# Patient Record
Sex: Female | Born: 1945
Health system: Southern US, Community
[De-identification: ages and names within clinical notes are randomized; demographics above are authoritative.]

## PROBLEM LIST (undated history)

## (undated) DIAGNOSIS — E039 Hypothyroidism, unspecified: Secondary | ICD-10-CM

## (undated) DIAGNOSIS — H409 Unspecified glaucoma: Secondary | ICD-10-CM

## (undated) DIAGNOSIS — E0500 Thyrotoxicosis with diffuse goiter without thyrotoxic crisis or storm: Secondary | ICD-10-CM

## (undated) DIAGNOSIS — G5760 Lesion of plantar nerve, unspecified lower limb: Secondary | ICD-10-CM

## (undated) DIAGNOSIS — I63111 Cerebral infarction due to embolism of right vertebral artery: Secondary | ICD-10-CM

## (undated) DIAGNOSIS — M419 Scoliosis, unspecified: Secondary | ICD-10-CM

## (undated) DIAGNOSIS — E785 Hyperlipidemia, unspecified: Secondary | ICD-10-CM

## (undated) DIAGNOSIS — K219 Gastro-esophageal reflux disease without esophagitis: Secondary | ICD-10-CM

## (undated) DIAGNOSIS — K2270 Barrett's esophagus without dysplasia: Secondary | ICD-10-CM

## (undated) DIAGNOSIS — K5792 Diverticulitis of intestine, part unspecified, without perforation or abscess without bleeding: Secondary | ICD-10-CM

## (undated) DIAGNOSIS — R011 Cardiac murmur, unspecified: Secondary | ICD-10-CM

## (undated) DIAGNOSIS — A048 Other specified bacterial intestinal infections: Secondary | ICD-10-CM

## (undated) DIAGNOSIS — G4700 Insomnia, unspecified: Secondary | ICD-10-CM

## (undated) DIAGNOSIS — I639 Cerebral infarction, unspecified: Secondary | ICD-10-CM

## (undated) DIAGNOSIS — B159 Hepatitis A without hepatic coma: Secondary | ICD-10-CM

## (undated) DIAGNOSIS — K5790 Diverticulosis of intestine, part unspecified, without perforation or abscess without bleeding: Secondary | ICD-10-CM

## (undated) DIAGNOSIS — F419 Anxiety disorder, unspecified: Secondary | ICD-10-CM

## (undated) DIAGNOSIS — H40059 Ocular hypertension, unspecified eye: Secondary | ICD-10-CM

## (undated) HISTORY — PX: SUBCLAVIAN STENT PLACEMENT: SUR1038

## (undated) HISTORY — DX: Scoliosis, unspecified: M41.9

## (undated) HISTORY — DX: Hypothyroidism, unspecified: E03.9

## (undated) HISTORY — DX: Diverticulitis of intestine, part unspecified, without perforation or abscess without bleeding: K57.92

## (undated) HISTORY — PX: FOOT NEUROMA SURGERY: SHX646

## (undated) HISTORY — DX: Thyrotoxicosis with diffuse goiter without thyrotoxic crisis or storm: E05.00

## (undated) HISTORY — PX: LAPAROSCOPIC ASSISTED VAGINAL HYSTERECTOMY: SHX5398

## (undated) HISTORY — DX: Gastro-esophageal reflux disease without esophagitis: K21.9

## (undated) HISTORY — DX: Hepatitis a without hepatic coma: B15.9

## (undated) HISTORY — PX: CATARACT EXTRACTION: SUR2

## (undated) HISTORY — DX: Unspecified glaucoma: H40.9

## (undated) HISTORY — DX: Other specified bacterial intestinal infections: A04.8

## (undated) HISTORY — PX: TUBAL LIGATION: SHX77

## (undated) HISTORY — DX: Lesion of plantar nerve, unspecified lower limb: G57.60

## (undated) HISTORY — PX: EYE SURGERY: SHX253

## (undated) HISTORY — DX: Cardiac murmur, unspecified: R01.1

## (undated) HISTORY — DX: Barrett's esophagus without dysplasia: K22.70

## (undated) HISTORY — DX: Cerebral infarction, unspecified: I63.9

## (undated) HISTORY — PX: FEMUR SURGERY: SHX943

## (undated) HISTORY — PX: COLON SURGERY: SHX602

## (undated) HISTORY — DX: Diverticulosis of intestine, part unspecified, without perforation or abscess without bleeding: K57.90

## (undated) HISTORY — DX: Anxiety disorder, unspecified: F41.9

## (undated) HISTORY — DX: Insomnia, unspecified: G47.00

## (undated) HISTORY — DX: Cerebral infarction due to embolism of right vertebral artery: I63.111

---
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
## 1990-03-12 HISTORY — PX: TONSILLECTOMY: SUR1361

## 1993-03-12 HISTORY — PX: CERVICAL LAMINECTOMY: SHX94

## 1997-07-13 ENCOUNTER — Other Ambulatory Visit: Admission: RE | Admit: 1997-07-13 | Discharge: 1997-07-13 | Payer: Self-pay | Admitting: Obstetrics and Gynecology

## 1998-05-26 ENCOUNTER — Ambulatory Visit (HOSPITAL_COMMUNITY): Admission: RE | Admit: 1998-05-26 | Discharge: 1998-05-26 | Payer: Self-pay | Admitting: Family Medicine

## 1998-08-17 ENCOUNTER — Other Ambulatory Visit: Admission: RE | Admit: 1998-08-17 | Discharge: 1998-08-17 | Payer: Self-pay | Admitting: Obstetrics and Gynecology

## 1999-02-24 ENCOUNTER — Encounter (INDEPENDENT_AMBULATORY_CARE_PROVIDER_SITE_OTHER): Payer: Self-pay | Admitting: Specialist

## 1999-02-24 ENCOUNTER — Ambulatory Visit (HOSPITAL_COMMUNITY): Admission: RE | Admit: 1999-02-24 | Discharge: 1999-02-24 | Payer: Self-pay | Admitting: Internal Medicine

## 1999-04-17 ENCOUNTER — Encounter: Payer: Self-pay | Admitting: Obstetrics and Gynecology

## 1999-04-17 ENCOUNTER — Encounter: Admission: RE | Admit: 1999-04-17 | Discharge: 1999-04-17 | Payer: Self-pay | Admitting: Obstetrics and Gynecology

## 2000-01-17 ENCOUNTER — Other Ambulatory Visit: Admission: RE | Admit: 2000-01-17 | Discharge: 2000-01-17 | Payer: Self-pay | Admitting: Obstetrics and Gynecology

## 2000-04-12 ENCOUNTER — Encounter: Admission: RE | Admit: 2000-04-12 | Discharge: 2000-04-12 | Payer: Self-pay | Admitting: Family Medicine

## 2000-04-12 ENCOUNTER — Encounter: Payer: Self-pay | Admitting: Family Medicine

## 2000-05-07 ENCOUNTER — Encounter: Admission: RE | Admit: 2000-05-07 | Discharge: 2000-05-07 | Payer: Self-pay | Admitting: Family Medicine

## 2000-05-07 ENCOUNTER — Encounter: Payer: Self-pay | Admitting: Family Medicine

## 2001-02-04 ENCOUNTER — Other Ambulatory Visit: Admission: RE | Admit: 2001-02-04 | Discharge: 2001-02-04 | Payer: Self-pay | Admitting: Obstetrics and Gynecology

## 2001-03-12 DIAGNOSIS — K2270 Barrett's esophagus without dysplasia: Secondary | ICD-10-CM

## 2001-03-12 HISTORY — DX: Barrett's esophagus without dysplasia: K22.70

## 2001-05-08 ENCOUNTER — Encounter: Admission: RE | Admit: 2001-05-08 | Discharge: 2001-05-08 | Payer: Self-pay | Admitting: Family Medicine

## 2001-05-08 ENCOUNTER — Encounter: Payer: Self-pay | Admitting: Family Medicine

## 2001-12-12 ENCOUNTER — Other Ambulatory Visit: Admission: RE | Admit: 2001-12-12 | Discharge: 2001-12-12 | Payer: Self-pay | Admitting: Family Medicine

## 2002-06-30 ENCOUNTER — Encounter: Admission: RE | Admit: 2002-06-30 | Discharge: 2002-06-30 | Payer: Self-pay | Admitting: Family Medicine

## 2002-06-30 ENCOUNTER — Encounter: Payer: Self-pay | Admitting: Family Medicine

## 2002-12-07 ENCOUNTER — Encounter: Admission: RE | Admit: 2002-12-07 | Discharge: 2002-12-07 | Payer: Self-pay | Admitting: Family Medicine

## 2002-12-07 ENCOUNTER — Encounter: Payer: Self-pay | Admitting: Family Medicine

## 2003-03-18 ENCOUNTER — Encounter: Admission: RE | Admit: 2003-03-18 | Discharge: 2003-03-18 | Payer: Self-pay | Admitting: Family Medicine

## 2003-05-13 ENCOUNTER — Encounter: Admission: RE | Admit: 2003-05-13 | Discharge: 2003-05-13 | Payer: Self-pay | Admitting: Family Medicine

## 2003-05-20 ENCOUNTER — Encounter: Admission: RE | Admit: 2003-05-20 | Discharge: 2003-05-20 | Payer: Self-pay | Admitting: Family Medicine

## 2003-06-24 ENCOUNTER — Encounter: Admission: RE | Admit: 2003-06-24 | Discharge: 2003-06-24 | Payer: Self-pay | Admitting: Family Medicine

## 2003-07-02 ENCOUNTER — Encounter: Admission: RE | Admit: 2003-07-02 | Discharge: 2003-07-02 | Payer: Self-pay | Admitting: Family Medicine

## 2003-08-16 ENCOUNTER — Encounter: Admission: RE | Admit: 2003-08-16 | Discharge: 2003-08-16 | Payer: Self-pay | Admitting: Family Medicine

## 2003-11-11 ENCOUNTER — Encounter: Admission: RE | Admit: 2003-11-11 | Discharge: 2003-11-11 | Payer: Self-pay | Admitting: Interventional Radiology

## 2004-04-27 ENCOUNTER — Ambulatory Visit (HOSPITAL_COMMUNITY): Admission: RE | Admit: 2004-04-27 | Discharge: 2004-04-27 | Payer: Self-pay | Admitting: Podiatry

## 2004-04-27 ENCOUNTER — Ambulatory Visit (HOSPITAL_BASED_OUTPATIENT_CLINIC_OR_DEPARTMENT_OTHER): Admission: RE | Admit: 2004-04-27 | Discharge: 2004-04-27 | Payer: Self-pay | Admitting: Podiatry

## 2004-04-27 ENCOUNTER — Encounter (INDEPENDENT_AMBULATORY_CARE_PROVIDER_SITE_OTHER): Payer: Self-pay | Admitting: *Deleted

## 2004-08-18 ENCOUNTER — Ambulatory Visit: Payer: Self-pay | Admitting: Internal Medicine

## 2004-08-29 ENCOUNTER — Encounter: Admission: RE | Admit: 2004-08-29 | Discharge: 2004-08-29 | Payer: Self-pay | Admitting: Family Medicine

## 2004-08-30 ENCOUNTER — Encounter (INDEPENDENT_AMBULATORY_CARE_PROVIDER_SITE_OTHER): Payer: Self-pay | Admitting: *Deleted

## 2004-08-30 ENCOUNTER — Ambulatory Visit: Payer: Self-pay | Admitting: Internal Medicine

## 2004-10-30 ENCOUNTER — Ambulatory Visit: Payer: Self-pay | Admitting: Pulmonary Disease

## 2004-12-05 ENCOUNTER — Encounter: Admission: RE | Admit: 2004-12-05 | Discharge: 2004-12-05 | Payer: Self-pay | Admitting: Pulmonary Disease

## 2004-12-29 ENCOUNTER — Ambulatory Visit: Payer: Self-pay | Admitting: Pulmonary Disease

## 2005-10-01 ENCOUNTER — Encounter: Admission: RE | Admit: 2005-10-01 | Discharge: 2005-10-01 | Payer: Self-pay | Admitting: Family Medicine

## 2006-03-16 ENCOUNTER — Inpatient Hospital Stay (HOSPITAL_COMMUNITY): Admission: EM | Admit: 2006-03-16 | Discharge: 2006-03-21 | Payer: Self-pay | Admitting: Emergency Medicine

## 2006-04-26 ENCOUNTER — Encounter: Admission: RE | Admit: 2006-04-26 | Discharge: 2006-04-26 | Payer: Self-pay | Admitting: General Surgery

## 2006-05-13 ENCOUNTER — Inpatient Hospital Stay (HOSPITAL_COMMUNITY): Admission: RE | Admit: 2006-05-13 | Discharge: 2006-05-17 | Payer: Self-pay | Admitting: General Surgery

## 2006-05-13 ENCOUNTER — Encounter (INDEPENDENT_AMBULATORY_CARE_PROVIDER_SITE_OTHER): Payer: Self-pay | Admitting: Specialist

## 2006-05-13 HISTORY — PX: LAPAROSCOPIC SIGMOID COLECTOMY: SHX5928

## 2006-07-03 ENCOUNTER — Encounter (INDEPENDENT_AMBULATORY_CARE_PROVIDER_SITE_OTHER): Payer: Self-pay | Admitting: *Deleted

## 2006-07-03 ENCOUNTER — Ambulatory Visit: Payer: Self-pay | Admitting: Internal Medicine

## 2006-07-04 ENCOUNTER — Encounter: Payer: Self-pay | Admitting: Internal Medicine

## 2006-10-03 ENCOUNTER — Encounter: Admission: RE | Admit: 2006-10-03 | Discharge: 2006-10-03 | Payer: Self-pay | Admitting: Family Medicine

## 2007-03-13 HISTORY — PX: LUMBAR DISC SURGERY: SHX700

## 2007-10-06 ENCOUNTER — Encounter: Admission: RE | Admit: 2007-10-06 | Discharge: 2007-10-06 | Payer: Self-pay | Admitting: Family Medicine

## 2008-01-27 ENCOUNTER — Encounter: Admission: RE | Admit: 2008-01-27 | Discharge: 2008-01-27 | Payer: Self-pay | Admitting: Family Medicine

## 2008-02-20 ENCOUNTER — Encounter: Admission: RE | Admit: 2008-02-20 | Discharge: 2008-02-20 | Payer: Self-pay | Admitting: Family Medicine

## 2008-02-25 ENCOUNTER — Ambulatory Visit (HOSPITAL_COMMUNITY): Admission: RE | Admit: 2008-02-25 | Discharge: 2008-02-26 | Payer: Self-pay | Admitting: Neurosurgery

## 2008-08-05 ENCOUNTER — Encounter: Payer: Self-pay | Admitting: Cardiovascular Disease

## 2008-10-08 ENCOUNTER — Encounter: Admission: RE | Admit: 2008-10-08 | Discharge: 2008-10-08 | Payer: Self-pay | Admitting: Family Medicine

## 2008-11-03 ENCOUNTER — Encounter: Admission: RE | Admit: 2008-11-03 | Discharge: 2008-11-03 | Payer: Self-pay | Admitting: Family Medicine

## 2009-06-02 ENCOUNTER — Encounter: Admission: RE | Admit: 2009-06-02 | Discharge: 2009-06-02 | Payer: Self-pay | Admitting: Orthopedic Surgery

## 2009-11-21 ENCOUNTER — Ambulatory Visit: Payer: Self-pay | Admitting: Oncology

## 2009-11-24 ENCOUNTER — Encounter: Admission: RE | Admit: 2009-11-24 | Discharge: 2009-11-24 | Payer: Self-pay | Admitting: Family Medicine

## 2009-12-02 LAB — CBC WITH DIFFERENTIAL/PLATELET
Basophils Absolute: 0 10*3/uL (ref 0.0–0.1)
Eosinophils Absolute: 0.2 10*3/uL (ref 0.0–0.5)
LYMPH%: 31.3 % (ref 14.0–49.7)
MCH: 30.1 pg (ref 25.1–34.0)
MCV: 90.2 fL (ref 79.5–101.0)
MONO#: 0.6 10*3/uL (ref 0.1–0.9)
MONO%: 9.8 % (ref 0.0–14.0)
Platelets: 267 10*3/uL (ref 145–400)
RDW: 12.7 % (ref 11.2–14.5)
WBC: 6.4 10*3/uL (ref 3.9–10.3)
lymph#: 2 10*3/uL (ref 0.9–3.3)

## 2009-12-02 LAB — MORPHOLOGY: PLT EST: ADEQUATE

## 2009-12-05 LAB — COMPREHENSIVE METABOLIC PANEL
ALT: 18 U/L (ref 0–35)
AST: 24 U/L (ref 0–37)
BUN: 22 mg/dL (ref 6–23)
Calcium: 9.5 mg/dL (ref 8.4–10.5)
Potassium: 5 mEq/L (ref 3.5–5.3)
Sodium: 137 mEq/L (ref 135–145)

## 2009-12-05 LAB — LACTATE DEHYDROGENASE: LDH: 162 U/L (ref 94–250)

## 2009-12-05 LAB — ERYTHROPOIETIN: Erythropoietin: 12.4 m[IU]/mL (ref 2.6–34.0)

## 2010-04-01 ENCOUNTER — Encounter: Payer: Self-pay | Admitting: Family Medicine

## 2010-04-01 ENCOUNTER — Encounter: Payer: Self-pay | Admitting: Interventional Radiology

## 2010-05-02 ENCOUNTER — Ambulatory Visit: Payer: Self-pay | Admitting: Internal Medicine

## 2010-05-12 ENCOUNTER — Other Ambulatory Visit (HOSPITAL_COMMUNITY)
Admission: RE | Admit: 2010-05-12 | Discharge: 2010-05-12 | Disposition: A | Discharge status: Home / Self Care | Payer: PRIVATE HEALTH INSURANCE | Source: Ambulatory Visit | Attending: Internal Medicine | Admitting: Internal Medicine

## 2010-05-12 ENCOUNTER — Encounter (INDEPENDENT_AMBULATORY_CARE_PROVIDER_SITE_OTHER): Payer: PRIVATE HEALTH INSURANCE | Admitting: Internal Medicine

## 2010-05-12 DIAGNOSIS — K219 Gastro-esophageal reflux disease without esophagitis: Secondary | ICD-10-CM

## 2010-05-12 DIAGNOSIS — E039 Hypothyroidism, unspecified: Secondary | ICD-10-CM

## 2010-05-12 DIAGNOSIS — Z0000 Encounter for general adult medical examination without abnormal findings: Secondary | ICD-10-CM | POA: Insufficient documentation

## 2010-05-12 DIAGNOSIS — Z23 Encounter for immunization: Secondary | ICD-10-CM

## 2010-05-27 ENCOUNTER — Emergency Department (HOSPITAL_COMMUNITY): Payer: PRIVATE HEALTH INSURANCE

## 2010-05-27 ENCOUNTER — Inpatient Hospital Stay (HOSPITAL_COMMUNITY)
Admission: EM | Admit: 2010-05-27 | Discharge: 2010-05-28 | DRG: 313 | Disposition: A | Discharge status: Home / Self Care | Payer: PRIVATE HEALTH INSURANCE | Attending: Internal Medicine | Admitting: Internal Medicine

## 2010-05-27 ENCOUNTER — Encounter (HOSPITAL_COMMUNITY): Payer: Self-pay | Admitting: Radiology

## 2010-05-27 DIAGNOSIS — E039 Hypothyroidism, unspecified: Secondary | ICD-10-CM | POA: Diagnosis present

## 2010-05-27 DIAGNOSIS — K219 Gastro-esophageal reflux disease without esophagitis: Secondary | ICD-10-CM | POA: Diagnosis present

## 2010-05-27 DIAGNOSIS — R0789 Other chest pain: Principal | ICD-10-CM | POA: Diagnosis present

## 2010-05-27 DIAGNOSIS — E0500 Thyrotoxicosis with diffuse goiter without thyrotoxic crisis or storm: Secondary | ICD-10-CM | POA: Diagnosis present

## 2010-05-27 LAB — CBC
HCT: 44.4 % (ref 36.0–46.0)
MCH: 30.4 pg (ref 26.0–34.0)
Platelets: 258 10*3/uL (ref 150–400)
RBC: 4.96 MIL/uL (ref 3.87–5.11)
RDW: 12.5 % (ref 11.5–15.5)

## 2010-05-27 LAB — PROTIME-INR
INR: 0.95 (ref 0.00–1.49)
Prothrombin Time: 12.9 seconds (ref 11.6–15.2)

## 2010-05-27 LAB — COMPREHENSIVE METABOLIC PANEL
BUN: 19 mg/dL (ref 6–23)
CO2: 25 mEq/L (ref 19–32)
Creatinine, Ser: 0.79 mg/dL (ref 0.4–1.2)
GFR calc Af Amer: >60 mL/min (ref >60)
GFR calc non Af Amer: >60 mL/min (ref >60)
Potassium: 4.1 mEq/L (ref 3.5–5.1)
Total Protein: 7.2 g/dL (ref 6.0–8.3)

## 2010-05-27 LAB — POCT CARDIAC MARKERS
CKMB, poc: <1 ng/mL — ABNORMAL LOW (ref 1.0–8.0)
Myoglobin, poc: 44.1 ng/mL (ref 12–200)

## 2010-05-27 LAB — APTT: aPTT: 27 seconds (ref 24–37)

## 2010-05-27 MED ORDER — IOHEXOL 350 MG/ML SOLN
100.0000 mL | Freq: Once | INTRAVENOUS | Status: AC | PRN
  Administered 2010-05-27: 100 mL via INTRAVENOUS

## 2010-05-27 MED ORDER — IOHEXOL 300 MG/ML  SOLN: 100.0000 mL | Freq: Once | INTRAMUSCULAR | Status: DC | PRN

## 2010-05-28 ENCOUNTER — Inpatient Hospital Stay (HOSPITAL_COMMUNITY): Payer: PRIVATE HEALTH INSURANCE

## 2010-05-28 DIAGNOSIS — R079 Chest pain, unspecified: Secondary | ICD-10-CM

## 2010-05-28 DIAGNOSIS — M549 Dorsalgia, unspecified: Secondary | ICD-10-CM

## 2010-05-28 LAB — DIFFERENTIAL
Lymphocytes Relative: 35 % (ref 12–46)
Lymphs Abs: 2 10*3/uL (ref 0.7–4.0)
Neutro Abs: 2.9 10*3/uL (ref 1.7–7.7)
Neutrophils Relative %: 49 % (ref 43–77)

## 2010-05-28 LAB — CBC
HCT: 42.3 % (ref 36.0–46.0)
Hemoglobin: 14 g/dL (ref 12.0–15.0)
MCV: 89.8 fL (ref 78.0–100.0)
Platelets: 230 10*3/uL (ref 150–400)
RBC: 4.71 MIL/uL (ref 3.87–5.11)
WBC: 5.9 10*3/uL (ref 4.0–10.5)

## 2010-05-28 LAB — TROPONIN I: Troponin I: 0.01 ng/mL (ref 0.00–0.06)

## 2010-05-28 LAB — CARDIAC PANEL(CRET KIN+CKTOT+MB+TROPI)
CK, MB: 1.6 ng/mL (ref 0.3–4.0)
Relative Index: INVALID (ref 0.0–2.5)
Total CK: 101 U/L (ref 7–177)
Troponin I: 0.01 ng/mL (ref 0.00–0.06)
Troponin I: 0.01 ng/mL (ref 0.00–0.06)

## 2010-05-28 LAB — LIPID PANEL
HDL: 58 mg/dL (ref >39)
Total CHOL/HDL Ratio: 3.3 RATIO
Triglycerides: 74 mg/dL (ref <150)
VLDL: 15 mg/dL (ref 0–40)

## 2010-05-28 LAB — BRAIN NATRIURETIC PEPTIDE: Pro B Natriuretic peptide (BNP): <30 pg/mL (ref 0.0–100.0)

## 2010-05-28 LAB — TSH: TSH: 2.287 u[IU]/mL (ref 0.350–4.500)

## 2010-05-28 MED ORDER — TECHNETIUM TC 99M TETROFOSMIN IV KIT
10.0000 | PACK | Freq: Once | INTRAVENOUS | Status: AC | PRN
  Administered 2010-05-28: 10 via INTRAVENOUS

## 2010-05-28 MED ORDER — TECHNETIUM TC 99M TETROFOSMIN IV KIT
30.0000 | PACK | Freq: Once | INTRAVENOUS | Status: AC | PRN
  Administered 2010-05-28: 30 via INTRAVENOUS

## 2010-05-29 ENCOUNTER — Other Ambulatory Visit (HOSPITAL_COMMUNITY): Payer: PRIVATE HEALTH INSURANCE

## 2010-05-31 NOTE — Discharge Summary (Signed)
  NAMECHRISTABELLE, [name] NO.:  [id number]  MEDICAL RECORD NO.:  [id number]           PATIENT TYPE:  I  LOCATION:  2040                         FACILITY:  [facility]  PHYSICIAN:  Kathlen Mody, MD       DATE OF BIRTH:  May 18, 1945  DATE OF ADMISSION:  05/27/2010 DATE OF DISCHARGE:  05/28/2010                              DISCHARGE SUMMARY   DISCHARGE DIAGNOSES: 1. Chest pain, most likely secondary to musculoskeletal pain versus     gastroesophageal reflux disease. 2. Graves disease, status post radioactive iodine ablation with     resolving hypothyroidism. 3. Diverticulitis. 4. Low back pain.  DISCHARGE MEDICATIONS: 1. Tramadol 25 mg q.8h. 2. Protonix 40 mg daily for 2 weeks.  CONSULTS:  Cardiology consult.  PROCEDURES DONE:  Myoview/stress test done.  PERTINENT LABS:  The patient had point-of-care cardiac markers negative. CBC within normal limits.  Comprehensive metabolic panel was significant for a sodium of 134, INR of 0.94, PTT of 27.  Three sets of cardiac enzymes negative.  Lipid profile shows an LDL of 120, total cholesterol of 193, and HDL of 58.  B-natriuretic peptide less than 30.  TSH within normal limits.  RADIOLOGY: 1. Chest x-ray, no active lung disease. 2. CT angiogram, negative CT, no evidence of acute PE. 3. Myoview scan, no perfusion defects, normal ejection fraction, and     normal wall motion.  BRIEF HOSPITAL COURSE:  This is a 65 year old lady with history of Graves disease, status post radioactive iodine ablation and low back pain who comes in with the left upper back.  She was worked up for acute coronary syndrome.  Her cardiac enzymes came back negative.  EKG was normal sinus rhythm.  A stress test came back negative.  Her ejection fraction was within normal limits.  Cardiology consult was called who recommended the same.  The patient's pain was relieved with tramadol, so chest pain was most likely secondary to musculoskeletal pain  versus gastroesophageal reflux disease.  She was started on Protonix, and she was given prescription for Tramadol 25 mg q.8h., and she was asked to follow up with her PCP in about 1-2 weeks.  DISCHARGE INSTRUCTIONS:  Follow up with PCP in 1-2 weeks.  Activity as tolerated.          ______________________________ Kathlen Mody, MD     VA/MEDQ  D:  05/30/2010  T:  05/30/2010  Job:  [id number]  Electronically Signed by Kathlen Mody MD on 05/31/2010 01:48:57 PM

## 2010-06-02 NOTE — Consult Note (Signed)
NAME:  [name], YUST NO.:  [id number]  MEDICAL RECORD NO.:  [id number]           PATIENT TYPE:  I  LOCATION:  2040                         FACILITY:  [facility]  PHYSICIAN:  Marca Ancona, MD      DATE OF BIRTH:  Mar 26, 1945  DATE OF CONSULTATION: DATE OF DISCHARGE:  05/28/2010                                CONSULTATION   HISTORY OF PRESENT ILLNESS:  This is a 65 year old with a history of Graves disease, status post radioiodine ablation and lumbar as well as C- spine disk disease, who presents with left upper back pain.  The patient reported the onset of pain behind her left scapula yesterday when she woke up.  Pain was quite intense.  All day long, the pain came and went. It did hurt if she leaned back against a chair or put pressure on it, although it was not tender to actual palpation.  The pain felt deep to her, it was not related to exertion.  She does not remember any back or neck strain recently, the pain is still coming and going now.  It gets better with Dilaudid, not really much responds with nitroglycerin.  The patient has no history of cardiac disease.  No hypertension, diabetes, or hyperlipidemia.  She has good exercise tolerance with no exertional chest pain or exertional dyspnea.  She works as a Tourist information centre manager.  The patient went to the emergency department.  She was admitted.  Cardiac enzymes so far negative.  EKGs unremarkable and CTA chest shows no dissection, no pulmonary embolus.  CURRENT MEDICATIONS: 1. Aspirin 325 mg daily. 2. Coreg 6.25 mg b.i.d. 3. Protonix 40 mg daily. 4. Crestor 40 mg daily. 5. Armour Thyroid.  PAST MEDICAL HISTORY: 1. Graves disease, status post radioactive iodine ablation with     resolving hypothyroidism. 2. History of diverticulitis with rupture, status post partial     colectomy. 3. Cervical spine disease with cervical laminectomy. 4. Low back pain.  The patient had lumbar disk disease, status post  diskectomy. 5. Appendectomy.  SOCIAL HISTORY:  The patient lives in [place].  She is a mammography tech.  She has a daughter, who works here as the Nutritional therapist.  She quit smoking 31 years ago.  She occasionally uses alcohol.  FAMILY HISTORY:  Mother has congestive heart failure.  Father had a stroke at 38.  Sister had MI at age 12.  REVIEW OF SYSTEMS:  All systems were reviewed and were negative except as noted in the history of present illness.  PHYSICAL EXAMINATION:  VITAL SIGNS:  Temperature 97.5, pulse 75 and regular, blood pressure 129/73, oxygen saturation 98% on room air. GENERAL:  This is a well-developed female in no apparent distress. NEUROLOGIC:  Alert and oriented x3.  Normal affect. NECK:  There is no thyromegaly or thyroid nodule. LUNGS:  Clear to auscultation bilaterally with normal respiratory effort. ABDOMEN:  Soft, nontender.  No hepatosplenomegaly. EXTREMITIES:  No clubbing or cyanosis. CARDIOVASCULAR:  Regular S1 and S2.  No S3, no S4.  There is no murmur. There are 2+ posterior tibial pulses bilaterally.  There is no carotid  bruits. MUSCULOSKELETAL:  There is no pain to palpation over her upper back. SKIN:  Normal exam.  RADIOLOGY:  CT of the chest shows no PE, no dissection.  Chest x-ray is clear.  EKG shows normal sinus rhythm.  There is no major change from the prior EKG of 2009.  LABORATORY DATA:  White count 5.9, hematocrit 42.3, platelets 230. Potassium 4.1, creatinine 0.79.  LFTs normal.  BNP less than 30.  Three sets of cardiac markers have been negative.  LDL 120, HDL 58.  IMPRESSION:  This is a 65 year old with no real cardiac risk factors, who presented with left upper back pain that has been going on and off for the last day and half now.  The pain is not exertional.  There is no dissection or pulmonary embolism on CT of her chest.  EKG is unremarkable.  Cardiac enzymes are normal.  Question if this might be related to her cervical spine  disease.  She had a Myoview done today, we are still awaiting for it to be processed.  If it is normal, it is okay for her to be discharged and plan to continue aspirin 81 mg daily at home once she is discharged.     Marca Ancona, MD     DM/MEDQ  D:  05/28/2010  T:  05/29/2010  Job:  [id number]  Electronically Signed by Marca Ancona MD on 06/02/2010 08:33:30 AM

## 2010-06-06 ENCOUNTER — Other Ambulatory Visit: Payer: PRIVATE HEALTH INSURANCE | Admitting: Internal Medicine

## 2010-06-22 NOTE — H&P (Signed)
NAME:  [name], [name] NO.:  [id number]  MEDICAL RECORD NO.:  [id number]           PATIENT TYPE:  E  LOCATION:  [facility]                         FACILITY:  [facility]  PHYSICIAN:  Massie Maroon, MD        DATE OF BIRTH:  14-Mar-1945  DATE OF ADMISSION:  05/27/2010 DATE OF DISCHARGE:                             HISTORY & PHYSICAL   CHIEF COMPLAINT:  Back pain.  HISTORY OF PRESENT ILLNESS:  This is a 65 year old female with a history of Graves disease status post radioactive iodine ablation on Armour thyroid replacement who apparently woke this morning with pain behind her left scapula.  The pain was persistent and it was a squeezing type of pain radiating anteriorly and because of this persistence of the pain she called her daughter who is an ER nurse and was told to come to the emergency room.  In the ER, she received sublingual nitroglycerin which made her feel better after about 5 minutes.  She does note that she had some acid reflux as well as dry cough.  She denies any fevers, chills, palpitations, shortness of breath, nausea, vomiting, diarrhea, bright red blood per rectum, or black stool.  Her EKG showed normal sinus rhythm, normal axis, Q-waves in V1-V2 with ST elevation in V1 and V2. This appears to be slightly old with ST elevation in V1 and V2 on EKG, February 25, 2008.  First set of cardiac markers were negative.  The patient is presently waiting CT angio chest to rule out any aortic dissection.  Her blood pressure in her arms per the ED was not different significantly.  The patient will be evaluated for this back/atypical chest pain.  PAST MEDICAL HISTORY: 1. Hypothyroidism, Graves disease status post radioactive iodine     ablation. 2. Diverticulitis. 3. Cervical spine disease. 4. Lower back pain. 5. Appendicitis. 6. Hep D.  PAST SURGICAL HISTORY: 1. Tubal ligation. 2. Appendectomy. 3. Cervical laminectomy 4. Lumbar diskectomy 5.  Laparoscopic-assisted vaginal hysterectomy. 6. Excision of right thigh soft tissue. 7. Partial colectomy for ruptured diverticulitis.  SOCIAL HISTORY:  The patient does not smoke.  She drinks socially.  She is married and has one daughter who is an Nutritional therapist.  She is working as a Tourist information centre manager at [employer].  FAMILY HISTORY:  Her mother had CHF, diagnosed in her 61s and died at age 66.  Her father died at age 66.  He had a stroke at age 54.  Her sister has had a heart attack at about age 45 and has diabetes.  She is alive at age 46.  There is no family history of colon cancer.  ALLERGIES:  STEROID.  MEDICATIONS:  Armour thyroid 90 mg Monday through to Thursday and 60 mg Friday, Saturday, and Sunday.  REVIEW OF SYSTEMS:  Negative for all 10 organ systems except for pertinent positives as stated above.  PHYSICAL EXAMINATION:  VITAL SIGNS:  Temperature 98.3, pulse 98, blood pressure 173/90, and pulse ox 100% on room air. HEENT:  Anicteric, EOMI, no nystagmus, pupils 1.5 mm, symmetric, direct consensual near reflex is intact.  Mucous membranes are moist. NECK:  No JVD, no bruit, no thyromegaly, and no adenopathy. HEART:  Regular rate and rhythm.  S1-S2.  No murmurs, gallops, or rubs. LUNGS:  Clear to auscultation bilaterally. ABDOMEN:  Soft, nontender, and nondistended.  Positive bowel sounds. EXTREMITIES:  No cyanosis, clubbing, or edema. MSK:  There is no pain with palpation under the left scapula.  There is no pain with range of motion of the left shoulder. LYMPH NODES:  No adenopathy. NEUROLOGIC:  Nonfocal.  Cranial nerves II-XII intact.  Reflexes 2+, symmetric, diffuse with downgoing toes bilaterally.  Motor strength 5/5 in all 4 extremities.  LABORATORY DATA:  Sodium 134 (low), potassium 4.1, BUN 19, and creatinine 0.79.  AST 26, ALT 20, alk phos 75, and total bilirubin 0.4. WBC 7.3, hemoglobin 15.1, and platelet count 258.  Troponin-I less than 0.05.  Chest x-ray  negative for any acute process.  CT angio chest pending.  ASSESSMENT AND PLAN: 1. Back pain/atypical chest pain with abnormal EKG:  The patient will     be placed on telemetry.  We will check CK, CK-MB, troponin I q.6 h.     x3 sets.  The patient is awaiting a CT angio chest to rule out any     aortic dissection in light of her back pain.  She does not look     uncomfortable and I think that threshold is low for this type of     problem.  The patient made n.p.o. after midnight in hope so we can     obtain a nuclear stress test in the morning.  If not, we can try to     obtain a exercise stress echo.  We will also check a lipid panel     and the patient     will be started on aspirin, Lipitor 80 mg p.o. at bedtime, and     carvedilol 6.25 mg p.o. b.i.d. 2. Hypothyroidism:  Continue Armour Thyroid. 3. Deep venous thrombosis prophylaxis:  SEDs and TEDs.     Massie Maroon, MD     JYK/MEDQ  D:  05/27/2010  T:  05/27/2010  Job:  [id number]  cc:   Luanna Cole. Lenord Fellers, M.D. Katy Fitch Sypher, M.D. Hewitt Shorts, M.D. Vesta Mixer, M.D. Bruce Elvera Lennox Juanda Chance, MD, [facility]  Electronically Signed by Pearson Grippe MD on 06/22/2010 01:42:56 AM

## 2010-07-25 NOTE — Op Note (Signed)
NAME:  [name], [name] NO.:  [id number]   MEDICAL RECORD NO.:  [id number]          PATIENT TYPE:  OIB   LOCATION:  3523                         FACILITY:  [facility]   PHYSICIAN:  Hewitt Shorts, M.D.DATE OF BIRTH:  09-06-1945   DATE OF PROCEDURE:  02/25/2008  DATE OF DISCHARGE:                               OPERATIVE REPORT   PREOPERATIVE DIAGNOSES:  1. Left L3-4 extraforaminal lumbar disk herniation.  2. Lumbar degenerative disk disease.  3. Lumbar spondylosis.  4. Lumbar radiculopathy.   POSTOPERATIVE DIAGNOSES:  1. Left L3-4  extraforaminal lumbar disk herniation.  2. Lumbar degenerative disk disease.  3. Lumbar spondylosis.  4. Lumbar radiculopathy.   PROCEDURE:  Left L3-4 extraforaminal microdiscectomy with  microdissection.   SURGEON:  Hewitt Shorts, MD   ANESTHESIA:  General endotracheal.   INDICATIONS:  The patient is a 65 year old woman who presented with a  disabling left lumbar radiculopathy.  It was found to be secondary to a  large left L3-L4 extraforaminal disk herniation with severe compression  of the left L3 nerve root.  Decision was made to proceed with elective  extraforaminal microdiscectomy.   PROCEDURE:  The patient was brought to the operating room and placed  under general endotracheal anesthesia.  The patient was turned to a  prone position.  Lumbar region was prepped with Betadine soap and  solution and draped in a sterile fashion.  The midline was infiltrated  with local anesthetic with epinephrine and x-ray was taken.  The L3-L4  level was identified and a midline incision was made over the L3-L4  level and carried down through the subcutaneous tissue.  Bipolar  electrocautery was used to maintain hemostasis.  Dissection was carried  down to the lumbar fascia, which was incised to the left side of the  midline and the paraspinal muscles were dissected from the spinous  process and lamina in a subperiosteal fashion.   Another X-ray was taken,  and the L3-L4 intralaminar space was identified and dissection was  carried over the L3-L4 facet complex into the extraforaminal space.  We  dissected down and identified the transverse process L4 and then  performed a lateral facetectomy, and removed a bit of the superior  aspect of the transverse process.  The operating microscope was draped  and brought into the field to provide additional magnification,  illumination, and visualization and  the remainder of the decompression  was performed using microdissection and microsurgical technique.  The  intertransverse fascia was opened and we dissected inferomedially and  identified the annulus with the underlying subligamentous disk  herniation and the left L3 nerve root that was severely compressed.  The  remaining annular fibers were incised and fragment extruded and then the  large disk herniation was removed in a piecemeal fashion using a variety  of  micro hooks and pituitary rongeurs.  At the end all loose fragments  of disk material were removed with good decompression of the left L3  nerve root.  Once decompression was completed, hemostasis was  established with the use of bipolar cautery as needed.  Then, we  instilled 2 mL of fentanyl and 80 mg of Depo-Medrol into the  extraforaminal space around the left L3 nerve root and then we proceeded  with closure.  The deep fascia was closed with interrupted undyed #1  Vicryl sutures.  The subcutaneous and subcuticular were closed with  interrupted inverted 2-0 undyed Vicryl sutures.  The skin was  approximated with Dermabond.  The procedure was tolerated well.  The  estimated blood loss was less than 20 mL.  Sponge and needle count were  correct.  Following surgery, the patient was turned back to the supine  position, reversed from the anesthetic, extubated, and transferred to  the recovery room for further care.      Hewitt Shorts, M.D.  Electronically  Signed     RWN/MEDQ  D:  02/25/2008  T:  02/26/2008  Job:  [id number]

## 2010-07-28 NOTE — Discharge Summary (Signed)
NAME:  [name], [name] NO.:  [id number]   MEDICAL RECORD NO.:  [id number]          PATIENT TYPE:  INP   LOCATION:  1404                         FACILITY:  [facility]   PHYSICIAN:  Adolph Pollack, M.D.DATE OF BIRTH:  1945/07/13   DATE OF ADMISSION:  05/13/2006  DATE OF DISCHARGE:  05/17/2006                               DISCHARGE SUMMARY   PRINCIPAL DISCHARGE DIAGNOSIS:  Recurrent sigmoid diverticulitis.   SECONDARY DIAGNOSES:  1. History of Grave's disease.  2. Hepatitis A.  3. Cervical spine disease.  4. Postop ileus.   PROCEDURE:  Laparoscopic assisted sigmoid colectomy.   REASON FOR ADMISSION:  This is a 65 year old female who has had previous  episodes of acute sigmoid diverticulitis treated as an outpatient.  In  early January of this year she had a severe episode requiring  hospitalization.  She had recovered from that and barium enema was  performed demonstrating the disease and she was admitted for elective  sigmoid colectomy.   HOSPITAL COURSE:  She underwent the above operation which she tolerated  well.  She will be started on clear liquid diet the first postoperative  day and the pathology was consistent with diverticulitis and  inflammatory changes.  She had a mild ileus that resolved so by the  third postoperative day she was passing gas and her diet was able to be  advanced.  Her fourth postop day she was tolerating a diet and had  stable bowel function and was able to be discharged.   DISPOSITION:  Discharge to home in satisfactory condition 05/17/2006.  She was given discharge instructions as well as Vicodin for pain.  She  is told to continue her usual home medications.  She will be seen in the  office for staple removal in 3-4 days and was told to call if she had  any problems.      Adolph Pollack, M.D.  Electronically Signed     TJR/MEDQ  D:  06/12/2006  T:  06/12/2006  Job:  [id number]   cc:   Talmadge Coventry, M.D.  Fax:  [phone]   Hedwig Morton. Juanda Chance, MD  [address]  [place]  [place] [address]

## 2010-07-28 NOTE — H&P (Signed)
NAME:  [name], [name] NO.:  [id number]   MEDICAL RECORD NO.:  [id number]          PATIENT TYPE:  INP   LOCATION:  0005                         FACILITY:  [facility]   PHYSICIAN:  Adolph Pollack, M.D.DATE OF BIRTH:  03-08-1946   DATE OF ADMISSION:  05/13/2006  DATE OF DISCHARGE:                              HISTORY & PHYSICAL   REASON FOR ADMISSION:  Elective sigmoid colectomy.   PRESENT ILLNESS:  [name] is a 65 year old female who has had previous  episodes of acute sigmoid diverticulitis.  She was admitted to the  hospital March 15, 2006, with a severe bout.  CT scan demonstrated  sigmoid diverticulitis with a microperforation.  She responded to  intravenous antibiotics and was able to be discharged.  A barium enema  demonstrates focal diverticular disease in the sigmoid colon region.  She now presents for this elective sigmoid colectomy.   PAST MEDICAL HISTORY:  1. Diverticulitis.  2. Cervical spine disease.  3. Graves disease.  4. Appendicitis.  5. Hepatitis A.   PREVIOUS OPERATIONS:  1. Bilateral tubal ligation.  2. Appendectomy.  3. Cervical laminectomy.  4. Laparoscopic assisted vaginal hysterectomy.  5. Excision of right thigh soft tissue tumor x3.   ALLERGIES:  STEROID INJECTIONS.   MEDICATIONS ON ADMISSION:  1. Armour Thyroid.  2. Diazepam.  3. Citrucel.  4. Multivitamin.  5. Fish oil.  6. Jeffie Pollock.   SOCIAL HISTORY:  She is a former smoker.  She occasionally has an  alcoholic beverage.  Works for the [employer] [place].  She is  married.   REVIEW OF SYSTEMS:  Essentially unremarkable.   PHYSICAL EXAMINATION:  GENERAL:  Well-developed, well-nourished female  in no acute distress, pleasant and cooperative.  VITAL SIGNS: Temperature is 97 degrees, pulse is 87, blood pressure is  159/87.  HEENT:  EYES:  Extraocular motions intact.  No icterus.  NECK:  Supple without masses.  RESPIRATORY:  Breath sounds equal, clear,  respirations unlabored.  CARDIOVASCULAR:  Heart demonstrates regular rate, regular rhythm.  No  murmur heard.  ABDOMEN:  Soft, nontender, nondistended.  Right lower quadrant  transverse scar is noted.  Small umbilical scars noted as well.  EXTREMITIES:  SCD hose on.   IMPRESSION:  Recurrent sigmoid diverticulitis.  Laparoscopic possible  open sigmoid colectomy.  We discussed the procedure and risks  preoperatively.      Adolph Pollack, M.D.  Electronically Signed     TJR/MEDQ  D:  05/13/2006  T:  05/13/2006  Job:  [id number]

## 2010-07-28 NOTE — Discharge Summary (Signed)
NAME:  [name], [name] NO.:  [id number]   MEDICAL RECORD NO.:  [id number]          PATIENT TYPE:  INP   LOCATION:  5740                         FACILITY:  [facility]   PHYSICIAN:  Cherylynn Ridges, M.D.    DATE OF BIRTH:  [birth year]   DATE OF ADMISSION:  03/15/2006  DATE OF DISCHARGE:  03/21/2006                               DISCHARGE SUMMARY   ADMITTING PHYSICIAN:  Adolph Pollack, M.D.   DISCHARGING PHYSICIAN:  Cherylynn Ridges, M.D.   CHIEF COMPLAINTS AND REASON FOR ADMISSION:  [name] is a 65 year old  female patient who has had four previous bouts of acute sigmoid  diverticulitis.  Twenty-four hours prior to admission, she began  experiencing similar symptoms and has progressed in severity. She had  low-grade fever of 99.5. She called her primary care physician, Dr.  Talmadge Coventry, who instructed her to present to the ER for  additional evaluation. In the ER, the patient was found to have a white  count of 16,000, and a CT scan revealed acute sigmoid diverticulitis  with microperforation.  Because of these findings, surgery was asked to  evaluate the patient. On examination, the patient's vital signs were  stable.  She was afebrile. On abdominal exam, she was found have a soft  abdomen, tenderness in the suprapubic and left lower quadrant areas with  hypoactive bowel sounds.  The patient was admitted with a diagnosis of  recurrent sigmoid diverticulitis without definite free perforation.   HOSPITAL COURSE:  The patient was admitted to the general floor where  she was placed on n.p.o. status, IV fluid hydration, and started on IV  antibiotics with Zosyn.   In the next few days, the patient did well.  She was having some right  lower quadrant and some right flank pain that had much improved as  compared to presentation. No nausea or vomiting. On clinical exam, she  was tender both in the right lower quadrant and flank areas. White count  was 7400.  She was  started on Toradol to help improve pain management  issues.   Throughout the remainder of the hospitalization, the patient continued  to improve.  She remained afebrile, vital signs stable.  White count  remained normal. She had been started on a clear liquid tray on March 18, 2006, and diet was slowly advanced to a low-residue diet.  She  received instruction on how to maintain this diet at home. She began  having bowel movements by the night time of March 19, 2006, and this  continued March 20, 2006, with three episode of what she described as  explosive diarrhea.  Her abdominal pain had markedly improved.  She was  having twinges of pain after having bowel movements. She was later  switched over to p.o. Augmentin, and plans were to discharge her home on  this medication. By March 21, 2006, the patient was having more normal  bowel movements.  Her pain had greatly improved. Her white count was  normal, and she was deemed appropriate for discharge home and   FINAL DISCHARGE DIAGNOSIS:  Recurrent acute diverticulitis   DISCHARGE MEDICATIONS:  The patient will remain on Flagyl 5 mg 3 times a  day and Cipro 500 mg b.i.d. for 7 days.   Return to work in 1 week.   DIET:  Low residue.   ACTIVITY:  Walk with assistance. May shower.  May walk up steps. Sexual  activity when comfortable.   FOLLOWUP APPOINTMENTS:  You need to see Dr. Abbey Chatters in 1 week.  Call  for that appointment, please.      Allison L. Rennis Harding, N.P.      Cherylynn Ridges, M.D.  Electronically Signed    ALE/MEDQ  D:  04/29/2006  T:  04/29/2006  Job:  [id number]   cc:   Adolph Pollack, M.D.  Talmadge Coventry, M.D.

## 2010-07-28 NOTE — Op Note (Signed)
NAMECHARLY, [name] NO.:  [id number]   MEDICAL RECORD NO.:  [id number]          PATIENT TYPE:  AMB   LOCATION:  DSC                          FACILITY:  [facility]   PHYSICIAN:  Richard C. Tuchman, D.P.M.DATE OF BIRTH:  [birth year]   DATE OF PROCEDURE:  04/27/2004  DATE OF DISCHARGE:                                 OPERATIVE REPORT   PREOPERATIVE DIAGNOSES:  Exostosis left hallux.   POSTOPERATIVE DIAGNOSES:  Consistent with preoperative diagnosis pending  pathology.   OPERATION:  Exostectomy left hallux.   SURGEON:  Richard C. Tuchman, D.P.M.   FIRST ASSISTANT:  Alvan Dame, D.P.M.   ANESTHESIA:  Via local block IV sedation.   HEMOSTASIS:  Via left ankle tourniquet.   INDICATIONS FOR PROCEDURE:  The patient has discomfort localized to the  medial border of the left hallux making shoe wearing and walking  uncomfortable. Preoperative x-ray demonstrates a bony lesion on the distal  medial aspect of the left hallux consistent with the area of discomfort.   DESCRIPTION OF PROCEDURE:  This patient now presents to the operating room  where IV sedation has been established. The left hallux is then blocked with  4 mL of a 50/50 mixture of 2% plain Xylocaine and 0.5% plain Marcaine. The  foot is then prepared and draped in the usual sterile manner. The left foot  is then exsanguinated via esmarch bandage and an ankle tourniquet is  inflated to 250 mmHg pressure.   1.  Exostectomy left hallux.  Twin converging linear incisions were made      over the medial nail lip of the left hallux and the created skin wedge      was excised in toto.  Protruding into the surgical field is a bony      cartilaginous capped lesion that is approximately 5 mm x 4 mm. The      tissues are undermined and the bony lesion is defined. Utilizing a bone      saw, the bony lesion is resected from dorsal to proximal and the      underlying bone rasp smooth. The lesion will be submitted for  pathology.      The wound site is then flushed with a copious amount of antibiotic      flush.  The skin margins are then approximated with multiple simple      interrupted sutures of 5-0 nylon.  At the conclusion of the procedure,      the wound is covered with some Betadine ointment and a Steri-Strip is      applied followed by dry sterile compression dressing. The tourniquet is      released and spontaneous capillary filling times of all times 1 through      5 on the left foot.  The patient tolerated the procedure and      anesthesia satisfactorily and transported to the recovery area with      stable vital signs.  Postoperative oral and written instructions      provided. Postoperative medications include Mepergan Fortis 35 doses, 1  or 2 p.o. q.4-6 h. p.r.n. pain. The patient will have first recheck in      the [place] office within five days.      RCT/MEDQ  D:  04/27/2004  T:  04/27/2004  Job:  [id number]

## 2010-07-28 NOTE — Op Note (Signed)
NAME:  [name], [name] NO.:  [id number]   MEDICAL RECORD NO.:  [id number]          PATIENT TYPE:  INP   LOCATION:  0005                         FACILITY:  [facility]   PHYSICIAN:  Adolph Pollack, M.D.DATE OF BIRTH:  04/23/1945   DATE OF PROCEDURE:  05/13/2006  DATE OF DISCHARGE:                               OPERATIVE REPORT   PREOPERATIVE DIAGNOSES:  Recurrent sigmoid diverticulitis.   POSTOPERATIVE DIAGNOSES:  Recurrent sigmoid diverticulitis.   PROCEDURE:  Laparoscopic-assisted sigmoid colectomy with mobilization of  splenic flexure.   SURGEON:  Adolph Pollack, MD.   ASSISTANT:  Claud Kelp, MD.   ANESTHESIA:  General.   INDICATIONS:  This is an 65 year old female whose had recurrent bouts of  sigmoid diverticulitis, the last time she ended up being treated in the  hospital.  She now presents for the above procedure.   TECHNIQUE:  She was seen in the holding area and then brought to the  operating room, placed supine on the operating table and a general  anesthetic was administered.  She was then placed in the lithotomy  position.  A Foley catheter was inserted.  The abdominal wall and  perineal area were sterilely prepped and draped.   A small supraumbilical incision was made through the skin, subcutaneous  tissue, fascia and peritoneum entering the peritoneal cavity under  direct vision.  A pursestring suture of #0 Vicryl was placed around the  fascial edges.  A Hassan trocar was introduced into the peritoneal  cavity and a pneumoperitoneum created by insufflation of CO2 gas.   Next the laparoscope was introduced.  Adhesions were noted in the right  lower quadrant region.  I placed a 10-mm trocar in the right mid abdomen  and a 5-mm trocar in the suprapubic region.  I identified the diseased  segment which appeared to be somewhat adherent to the anterior pelvic  wall.  I began mobilizing the sigmoid descending colon by dividing the  lateral  attachments.  I identified the left ureter and kept it below the  plane of dissection.  I continued to mobilize the left colon by dividing  its lateral attachments up to the point of the spleen.  I then directed  my attention inferiorly to the pelvis and using the harmonic scalpel and  blunt dissection freed up the inflammatory process involving the sigmoid  colon from its anterior pelvic attachments.  No abscess was noted.  I  then mobilized part of the distal sigmoid colon down to the rectosigmoid  junction and was able to bring this up into the abdominal cavity from  the pelvis.   Following this, I placed a 50-mm trocar in the left mid abdomen and  mobilized the splenic flexure by dividing the attachments using the  harmonic scalpel and then using an Omega maneuver.  While doing this, a  centimeter to a subcentimeter capsular tear was noted.  This had minimal  bleeding from it.  I controlled this with electrocautery and then  applied some FloSeal and Surgicel in this area and no bleeding recurred.   At this point, I  was able to mobilize the descending colon down into the  pelvis and bring the rectosigmoid junction up to the abdominal cavity.  I then removed the 5-mm trocar in the suprapubic region and made a lower  transverse incision through the skin, subcutaneous tissue and fascia.  I  then split the rectus muscle and incised the peritoneum.  I grasped the  diseased segment and brought it extracorporeal.  I picked a point  proximal and distal to the diseased segment with normal appearing colon  and divided the colon with the GIA stapler.  The mesentery of the  resected segment was divided with LigaSure. The segment was passed off  the field.   Following this, I then performed a side-to-side stapled anastomosis.  The common defect was closed in two layers with a running 3-0 Vicryl  full-thickness layer and a 3-0 silk interrupted Lembert-type layer.  A  crotch stitch was placed.   The anastomosis was patent, viable and under  no tension and was dropped back into the abdominal cavity.   Gloves were not then changed. The abdominal cavity was copiously  irrigated out with fluid and fluid evacuated.  Normal active bleeding  was noted.  I requested a sponge count and it was reported to be  correct.   I then closed the peritoneum with a running 2-0 Vicryl suture.  The  fascia was then closed with a running #1 PDS suture. I reinsufflated the  abdomen and inspected the area and there was no evidence of bleeding and  the fascial closure was solid.  I then removed the remaining trocars and  released the pneumoperitoneum.   The subumbilical fascial defect was closed by tightening up and tying  down the pursestring suture.  The skin incisions were then closed with  staples and sterile dressings were applied.   She tolerated the procedure well without any apparent complications and  was taken to the recovery room in satisfactory condition.      Adolph Pollack, M.D.  Electronically Signed     TJR/MEDQ  D:  05/13/2006  T:  05/13/2006  Job:  [id number]   cc:   Talmadge Coventry, M.D.  Fax: [phone]

## 2010-07-28 NOTE — H&P (Signed)
NAME:  [name], [name] NO.:  [id number]   MEDICAL RECORD NO.:  [id number]          PATIENT TYPE:  EMS   LOCATION:  MAJO                         FACILITY:  [facility]   PHYSICIAN:  Adolph Pollack, M.D.DATE OF BIRTH:  09-10-1945   DATE OF ADMISSION:  03/15/2006  DATE OF DISCHARGE:                              HISTORY & PHYSICAL   REASON FOR ADMISSION:  Recurrent acute sigmoid diverticulitis.   HISTORY OF PRESENT ILLNESS:  Ms. Barga is a 65 year old female who has  had four previous bouts of acute sigmoid diverticulitis by her report.  Yesterday afternoon she reported feeling some pressure-type pain in the  lower abdomen that increased.  She had a low-grade fever of 99.5.  She  called the physician covering for Dr. Smith Mince, who instructed her to  come into the emergency department for evaluation.  While here she  underwent a CT scan of her abdomen, which demonstrated findings  consistent with acute an sigmoid diverticulitis and a microperforation.  White cell count 16,000.  No contrast extravasation noted.  I  subsequently was asked to see her.   PAST MEDICAL HISTORY:  1. Diverticulitis.  2. Cervical spine disease.  3. Graves disease.  4. Appendicitis.  5. Hepatitis A.   PREVIOUS OPERATIONS:  1. Bilateral tubal ligation.  2. Appendectomy.  3. Laparoscopic-assisted vaginal hysterectomy.  4. Cervical laminectomy.  5. Excision of right thigh soft tissue tumor x3.   ALLERGIES:  Question STEROIDS as an allergy.   Medications currently include amoxicillin, thyroid replacement, calcium,  Valium p.r.n., Motrin, multivitamins, fish oil.   SOCIAL HISTORY:  She is a former smoker.  Occasionally has an alcoholic  beverage.  She is married.  She works for the [employer].   REVIEW OF SYSTEMS:  GENERAL:  She was in her normal state of health  until this bout and has no unexplained weight loss.  CARDIOVASCULAR:  Denies any hypertension or heart disease.   PULMONARY:  Denies pneumonia,  asthma, COPD.  GI:  Denies peptic ulcer disease, melena or hematochezia.  GU: Denies dysuria, hematuria or kidney stones.  ENDOCRINE:  Denies  diabetes.  Is now hypothyroid after iodine treatment for Graves disease.  Borderline elevated cholesterol by her report.  NEUROLOGIC:  No strokes  or seizures.  HEMATOLOGIC:  No bleeding disorders, blood transfusions of  blood clots.   PHYSICAL EXAMINATION:  GENERAL:  A well-developed, well-nourished female  in no acute distress, pleasant and cooperative.  VITAL SIGNS:  Temperature is 97.6, blood pressure is 127/72, pulse 91.  HEENT:  Eyes:  Extraocular motions intact.  No icterus.  NECK:  Supple without masses or obvious thyroid enlargement.  RESPIRATORY:  The breath sounds are equal and clear, respirations  unlabored.  CARDIOVASCULAR:  A regular rate and regular rhythm.  No murmur, no JVD.  ABDOMEN:  Soft.  Right lower quadrant scar is present.  Multiple small  lower abdominal scars are noted.  She has tenderness in the suprapubic  and the left lower quadrant regions.  Hypoactive bowel sounds noted.  EXTREMITIES:  No clubbing, cyanosis or edema is present.  NEUROLOGIC:  She is alert and oriented and answers questions  appropriately.   LABORATORY DATA:  Notable for white blood cell count of 16,000.  Hemoglobin was 14.2.  She is not acidotic.   CT scan was reviewed demonstrating sigmoid diverticulitis with a small  focus of extraluminal air and no contrast extravasation.   IMPRESSION:  Recurrent sigmoid diverticulitis, no free perforation.   PLAN:  Admit to the hospital, bowel rest, IV antibiotics.  I told her  typically this would help resolve the situation but if not, she may need  an urgent colectomy and colostomy.  If she was able to resolve the  situation nonoperatively, I suggested that she strongly consider  elective sigmoid colectomy in the future.      Adolph Pollack, M.D.  Electronically  Signed     TJR/MEDQ  D:  03/16/2006  T:  03/16/2006  Job:  [id number]   cc:   Talmadge Coventry, M.D.  Hedwig Morton. Juanda Chance, MD

## 2010-08-28 ENCOUNTER — Other Ambulatory Visit: Payer: Self-pay | Admitting: Interventional Radiology

## 2010-08-28 DIAGNOSIS — I8393 Asymptomatic varicose veins of bilateral lower extremities: Secondary | ICD-10-CM

## 2010-09-27 ENCOUNTER — Inpatient Hospital Stay: Admission: RE | Admit: 2010-09-27 | Payer: PRIVATE HEALTH INSURANCE | Source: Ambulatory Visit

## 2010-10-13 ENCOUNTER — Other Ambulatory Visit: Payer: Self-pay | Admitting: Orthopedic Surgery

## 2010-10-13 DIAGNOSIS — D369 Benign neoplasm, unspecified site: Secondary | ICD-10-CM

## 2010-10-18 ENCOUNTER — Ambulatory Visit
Admission: RE | Admit: 2010-10-18 | Discharge: 2010-10-18 | Disposition: A | Discharge status: Home / Self Care | Payer: PRIVATE HEALTH INSURANCE | Source: Ambulatory Visit | Attending: Orthopedic Surgery | Admitting: Orthopedic Surgery

## 2010-10-18 DIAGNOSIS — D369 Benign neoplasm, unspecified site: Secondary | ICD-10-CM

## 2010-10-18 MED ORDER — GADOBENATE DIMEGLUMINE 529 MG/ML IV SOLN
11.0000 mL | Freq: Once | INTRAVENOUS | Status: AC | PRN
  Administered 2010-10-18: 11 mL via INTRAVENOUS

## 2010-12-15 LAB — COMPREHENSIVE METABOLIC PANEL
ALT: 22 U/L (ref 0–35)
AST: 23 U/L (ref 0–37)
Albumin: 4.1 g/dL (ref 3.5–5.2)
Alkaline Phosphatase: 88 U/L (ref 39–117)
BUN: 17 mg/dL (ref 6–23)
CO2: 23 mEq/L (ref 19–32)
Calcium: 9.6 mg/dL (ref 8.4–10.5)
Chloride: 100 mEq/L (ref 96–112)
Creatinine, Ser: 0.7 mg/dL (ref 0.4–1.2)
GFR calc Af Amer: >60 mL/min (ref >60)
GFR calc non Af Amer: >60 mL/min (ref >60)
Glucose, Bld: 101 mg/dL — ABNORMAL HIGH (ref 70–99)
Potassium: 4.6 mEq/L (ref 3.5–5.1)
Sodium: 132 mEq/L — ABNORMAL LOW (ref 135–145)
Total Bilirubin: 0.8 mg/dL (ref 0.3–1.2)
Total Protein: 7.2 g/dL (ref 6.0–8.3)

## 2010-12-15 LAB — CBC
HCT: 49 % — ABNORMAL HIGH (ref 36.0–46.0)
Hemoglobin: 16.6 g/dL — ABNORMAL HIGH (ref 12.0–15.0)
MCHC: 33.9 g/dL (ref 30.0–36.0)
MCV: 89.2 fL (ref 78.0–100.0)
Platelets: 307 10*3/uL (ref 150–400)
RBC: 5.49 MIL/uL — ABNORMAL HIGH (ref 3.87–5.11)
RDW: 13.8 % (ref 11.5–15.5)
WBC: 10.9 10*3/uL — ABNORMAL HIGH (ref 4.0–10.5)

## 2011-01-31 ENCOUNTER — Other Ambulatory Visit: Payer: Self-pay | Admitting: Family Medicine

## 2011-01-31 ENCOUNTER — Ambulatory Visit
Admission: RE | Admit: 2011-01-31 | Discharge: 2011-01-31 | Disposition: A | Discharge status: Home / Self Care | Payer: PRIVATE HEALTH INSURANCE | Source: Ambulatory Visit | Attending: Family Medicine | Admitting: Family Medicine

## 2011-01-31 DIAGNOSIS — Z1231 Encounter for screening mammogram for malignant neoplasm of breast: Secondary | ICD-10-CM

## 2011-02-14 ENCOUNTER — Ambulatory Visit
Admission: RE | Admit: 2011-02-14 | Discharge: 2011-02-14 | Disposition: A | Discharge status: Home / Self Care | Payer: PRIVATE HEALTH INSURANCE | Source: Ambulatory Visit | Attending: Interventional Radiology | Admitting: Interventional Radiology

## 2011-02-14 DIAGNOSIS — I8393 Asymptomatic varicose veins of bilateral lower extremities: Secondary | ICD-10-CM

## 2011-02-23 ENCOUNTER — Other Ambulatory Visit: Payer: Self-pay | Admitting: Family Medicine

## 2011-02-23 DIAGNOSIS — Z803 Family history of malignant neoplasm of breast: Secondary | ICD-10-CM

## 2011-03-01 ENCOUNTER — Other Ambulatory Visit: Payer: PRIVATE HEALTH INSURANCE

## 2011-03-07 ENCOUNTER — Encounter (HOSPITAL_COMMUNITY): Payer: Self-pay | Admitting: Emergency Medicine

## 2011-03-07 ENCOUNTER — Emergency Department (INDEPENDENT_AMBULATORY_CARE_PROVIDER_SITE_OTHER)
Admission: EM | Admit: 2011-03-07 | Discharge: 2011-03-07 | Disposition: A | Discharge status: Home / Self Care | Payer: PRIVATE HEALTH INSURANCE | Source: Home / Self Care | Attending: Family Medicine | Admitting: Family Medicine

## 2011-03-07 DIAGNOSIS — J40 Bronchitis, not specified as acute or chronic: Secondary | ICD-10-CM

## 2011-03-07 MED ORDER — HYDROCODONE-ACETAMINOPHEN 7.5-325 MG/15ML PO SOLN: 15.0000 mL | Freq: Three times a day (TID) | ORAL | Status: AC | PRN

## 2011-03-07 MED ORDER — PREDNISONE 20 MG PO TABS: ORAL_TABLET | ORAL | Status: AC

## 2011-03-07 MED ORDER — AZITHROMYCIN 250 MG PO TABS: 250.0000 mg | ORAL_TABLET | Freq: Every day | ORAL | Status: AC

## 2011-03-07 NOTE — ED Notes (Signed)
PT HERE WITH MOIST PRODUCTIVE COUGH WITH CHEST CONGESTION AND YELLOW/WHITE MUCOUS THAT STARTED X 12 DYS WITH COLD SX.PT STATES SX HAS RESOLVED BUT COUGH IS PERSISTENT.PT HAS HX INTERMITT BRONCHITIS.AFEBRILE.SATS 99R/A

## 2011-03-07 NOTE — ED Provider Notes (Signed)
History     CSN: [id number]  Arrival date & time 03/07/11  1715   First MD Initiated Contact with Patient 03/07/11 1809      Chief Complaint  Patient presents with  . Bronchitis    (Consider location/radiation/quality/duration/timing/severity/associated sxs/prior treatment) HPI Comments: 65 y/o non smoker female PMH significant for hypothyroidism here c/o cold symptoms for 12 days, congestion improving but perssitent cough with yellow/white sputum and body aches associated with mild sinus pressure and nasal congestion that appears improving as per patient. Decreased appetite, keeping fluids down. Feels fatigued. Denies shortness of breath or pleuritic type of chest pain. No wheezing but braking type of cough spells, no nausea vomiting or diarrhea.    Past Medical History  Diagnosis Date  . Thyroid disease     Past Surgical History  Procedure Date  . Tonsillectomy     No family history on file.  History  Substance Use Topics  . Smoking status: Never Smoker   . Smokeless tobacco: Not on file  . Alcohol Use: Yes     SOCIALLY    OB History    Grav Para Term Preterm Abortions TAB SAB Ect Mult Living                  Review of Systems  Constitutional: Positive for chills, appetite change and fatigue. Negative for fever.  HENT: Positive for congestion and rhinorrhea. Negative for ear pain, sore throat, trouble swallowing, neck pain and voice change.   Eyes: Negative for discharge.  Respiratory: Positive for cough. Negative for chest tightness, shortness of breath, wheezing and stridor.   Cardiovascular: Negative for chest pain, palpitations and leg swelling.  Musculoskeletal: Positive for myalgias.  Skin: Negative for rash.  Neurological: Negative for dizziness and headaches.    Allergies  Review of patient's allergies indicates no known allergies.  Home Medications   Current Outpatient Rx  Name Route Sig Dispense Refill  . LEVOTHYROXINE SODIUM 88 MCG PO TABS  Oral Take 88 mcg by mouth daily.      . AZITHROMYCIN 250 MG PO TABS Oral Take 1 tablet (250 mg total) by mouth daily. Take first 2 tablets together, then 1 every day until finished. 6 tablet 0  . HYDROCODONE-ACETAMINOPHEN 7.5-325 MG/15ML PO SOLN Oral Take 15 mLs by mouth every 8 (eight) hours as needed for pain (can take for cough). 120 mL 0  . PREDNISONE 20 MG PO TABS  2 tabs po daily for 5 days 10 tablet 0    BP 148/90  Pulse 81  Temp(Src) 98.1 F (36.7 C) (Tympanic)  Resp 22  SpO2 99%  Physical Exam  Constitutional: She is oriented to person, place, and time. She appears well-developed and well-nourished. No distress.  HENT:  Head: Normocephalic and atraumatic.       Nasal Congestion with erythema and swelling of nasal turbinates, clear rhinorrhea. pharyngeal erythema no exudates. No uvula deviation. No trismus. TM's with increased vascular markings and some dullness bilaterally no swelling or bulging   Eyes: Conjunctivae and EOM are normal. Pupils are equal, round, and reactive to light. Right eye exhibits no discharge. Left eye exhibits no discharge.  Neck: Neck supple. No JVD present.  Cardiovascular: Normal rate, regular rhythm and normal heart sounds.   Pulmonary/Chest: Effort normal and breath sounds normal. No respiratory distress. She has no wheezes. She has no rales. She exhibits no tenderness.       Bronchitic cough on exam  Musculoskeletal: She exhibits no edema.  Lymphadenopathy:  She has no cervical adenopathy.  Neurological: She is alert and oriented to person, place, and time.  Skin: No rash noted.    ED Course  Procedures (including critical care time)  Labs Reviewed - No data to display No results found.   1. Bronchitis       MDM   Treated with prednisone, azithromycin and hydrocodone.        Sharin Grave, MD 03/08/11 [phone]

## 2011-03-19 ENCOUNTER — Emergency Department (INDEPENDENT_AMBULATORY_CARE_PROVIDER_SITE_OTHER): Payer: BC Managed Care – PPO

## 2011-03-19 ENCOUNTER — Emergency Department (HOSPITAL_COMMUNITY)
Admission: EM | Admit: 2011-03-19 | Discharge: 2011-03-19 | Disposition: A | Discharge status: Home / Self Care | Payer: BC Managed Care – PPO | Source: Home / Self Care | Attending: Emergency Medicine | Admitting: Emergency Medicine

## 2011-03-19 ENCOUNTER — Encounter (HOSPITAL_COMMUNITY): Payer: Self-pay

## 2011-03-19 DIAGNOSIS — J45909 Unspecified asthma, uncomplicated: Secondary | ICD-10-CM

## 2011-03-19 HISTORY — DX: Ocular hypertension, unspecified eye: H40.059

## 2011-03-19 MED ORDER — METHYLPREDNISOLONE ACETATE 80 MG/ML IJ SUSP
INTRAMUSCULAR | Status: AC
  Filled 2011-03-19: qty 1

## 2011-03-19 MED ORDER — BENZONATATE 200 MG PO CAPS: 200.0000 mg | ORAL_CAPSULE | Freq: Three times a day (TID) | ORAL | Status: AC | PRN

## 2011-03-19 MED ORDER — ALBUTEROL SULFATE HFA 108 (90 BASE) MCG/ACT IN AERS
INHALATION_SPRAY | RESPIRATORY_TRACT | Status: AC
  Filled 2011-03-19: qty 6.7

## 2011-03-19 MED ORDER — PREDNISONE 10 MG PO TABS: ORAL_TABLET | ORAL | Status: DC

## 2011-03-19 MED ORDER — ALBUTEROL SULFATE HFA 108 (90 BASE) MCG/ACT IN AERS
2.0000 | INHALATION_SPRAY | RESPIRATORY_TRACT | Status: DC
  Administered 2011-03-19 (×2): 2 via RESPIRATORY_TRACT

## 2011-03-19 MED ORDER — METHYLPREDNISOLONE ACETATE PF 80 MG/ML IJ SUSP
80.0000 mg | Freq: Once | INTRAMUSCULAR | Status: AC
  Administered 2011-03-19: 80 mg via INTRAMUSCULAR

## 2011-03-19 NOTE — ED Provider Notes (Signed)
Chief Complaint  Patient presents with  . Cough    History of Present Illness:  [name] has a cough that began around December 14. She was seen here December 26 and diagnosed with bronchitis. She was given a Z-Pak, prednisone, and hydrocodone cough syrup. She doesn't feel much better. She still having a cough productive of clear sputum, occasional wheezing, chills, nasal congestion, rhinorrhea, postnasal drainage, and some indigestion. She denies any fever, sore throat, or chest pain. She does have a history of asthma in the past, but none recently.  Review of Systems:  Other than noted above, the patient denies any of the following symptoms. Systemic:  No fever, chills, sweats, fatigue, myalgias, headache, or anorexia. Eye:  No redness, pain or drainage. ENT:  No earache, nasal congestion, rhinorrhea, sinus pressure, or sore throat. Lungs:  No cough, sputum production, wheezing, shortness of breath. Or chest pain. GI:  No nausea, vomiting, abdominal pain or diarrhea. Skin:  No rash or itching.  PMFSH:  Past medical history, family history, social history, meds, and allergies were reviewed.  Physical Exam:   Vital signs:  BP 137/73  Pulse 79  Temp(Src) 98 F (36.7 C) (Oral)  Resp 18  SpO2 100% General:  Alert, in no distress. Eye:  No conjunctival injection or drainage. ENT:  TMs and canals were normal, without erythema or inflammation.  Nasal mucosa was clear and uncongested, without drainage.  Mucous membranes were moist.  Pharynx was clear, without exudate or drainage.  There were no oral ulcerations or lesions. Neck:  Supple, no adenopathy, tenderness or mass. Lungs:  No respiratory distress.  Lungs were clear to auscultation, without wheezes, rales or rhonchi.  Breath sounds were clear and equal bilaterally. Heart:  Regular rhythm, without gallops, murmers or rubs. Skin:  Clear, warm, and dry, without rash or lesions.  Labs:  None    Radiology:  Dg Chest 2 View  03/19/2011   *RADIOLOGY REPORT*  Clinical Data: Cough and cold symptoms.  CHEST - 2 VIEW  Comparison: Chest radiograph 05/27/2010 the  Findings: Normal cardiac silhouette.  Lungs are hyperinflated.  No effusion, infiltrate, or pneumothorax  IMPRESSION: No acute cardiopulmonary process.  Original Report Authenticated By: Genevive Bi, M.D.    Medications given in [facility]:  She was given an albuterol inhaler and instructed to use 2 puffs every 4 hours while awake.  Assessment:   Diagnoses that have been ruled out:  Diagnoses that are still under consideration:  Final diagnoses:  Reactive airway disease     Plan:   1.  The following meds were prescribed:   New Prescriptions   BENZONATATE (TESSALON) 200 MG CAPSULE    Take 1 capsule (200 mg total) by mouth 3 (three) times daily as needed for cough.   PREDNISONE (DELTASONE) 10 MG TABLET    Take 4 tabs daily for 4 days, 3 tabs daily for 4 days, 2 tabs daily for 4 days, then 1 tab daily for 4 days.  Take all tabs at one time with food and preferably in the morning except for the first dose.   2.  The patient was instructed in symptomatic care and handouts were given. 3.  The patient was told to return if becoming worse in any way, if no better in 3 or 4 days, and given some red flag symptoms that would indicate earlier return. 4.  I told the patient to followup with her primary care physician if no better in 2 weeks. Also suggested she may  want to try an antihistamine such as Allegra for the postnasal drip and if the reflux is bothersome Prilosec OTC since these things may exacerbate a cough as well.   Roque Lias, MD 03/19/11 2218

## 2011-03-19 NOTE — ED Notes (Signed)
Pt seen here in mid December and diagnosed with bronchitis.  States she completed antibiotics and prednisone but continues to have productive cough of thick clear sputum and fatigue.

## 2011-05-21 ENCOUNTER — Encounter: Payer: Self-pay | Admitting: *Deleted

## 2011-09-18 ENCOUNTER — Other Ambulatory Visit: Payer: Self-pay | Admitting: Orthopedic Surgery

## 2011-09-18 DIAGNOSIS — G5760 Lesion of plantar nerve, unspecified lower limb: Secondary | ICD-10-CM

## 2011-09-24 ENCOUNTER — Other Ambulatory Visit: Payer: Medicare Other

## 2011-09-24 ENCOUNTER — Ambulatory Visit
Admission: RE | Admit: 2011-09-24 | Discharge: 2011-09-24 | Disposition: A | Discharge status: Home / Self Care | Payer: BC Managed Care – PPO | Source: Ambulatory Visit | Attending: Otolaryngology | Admitting: Otolaryngology

## 2011-09-24 ENCOUNTER — Other Ambulatory Visit: Payer: Self-pay | Admitting: Otolaryngology

## 2011-09-24 DIAGNOSIS — R52 Pain, unspecified: Secondary | ICD-10-CM

## 2011-09-28 ENCOUNTER — Ambulatory Visit
Admission: RE | Admit: 2011-09-28 | Discharge: 2011-09-28 | Disposition: A | Discharge status: Home / Self Care | Payer: Medicare Other | Source: Ambulatory Visit | Attending: Orthopedic Surgery | Admitting: Orthopedic Surgery

## 2011-09-28 DIAGNOSIS — G5760 Lesion of plantar nerve, unspecified lower limb: Secondary | ICD-10-CM

## 2011-09-28 MED ORDER — GADOBENATE DIMEGLUMINE 529 MG/ML IV SOLN
10.0000 mL | Freq: Once | INTRAVENOUS | Status: AC | PRN
  Administered 2011-09-28: 10 mL via INTRAVENOUS

## 2011-10-01 ENCOUNTER — Other Ambulatory Visit: Payer: BC Managed Care – PPO

## 2011-10-09 DIAGNOSIS — J329 Chronic sinusitis, unspecified: Secondary | ICD-10-CM | POA: Insufficient documentation

## 2011-11-02 DIAGNOSIS — G5760 Lesion of plantar nerve, unspecified lower limb: Secondary | ICD-10-CM | POA: Insufficient documentation

## 2011-11-26 DIAGNOSIS — K219 Gastro-esophageal reflux disease without esophagitis: Secondary | ICD-10-CM | POA: Diagnosis present

## 2012-02-08 ENCOUNTER — Ambulatory Visit
Admission: RE | Admit: 2012-02-08 | Discharge: 2012-02-08 | Disposition: A | Discharge status: Home / Self Care | Payer: Medicare Other | Source: Ambulatory Visit | Attending: Family Medicine | Admitting: Family Medicine

## 2012-02-08 ENCOUNTER — Other Ambulatory Visit: Payer: Self-pay | Admitting: Family Medicine

## 2012-02-08 DIAGNOSIS — Z1231 Encounter for screening mammogram for malignant neoplasm of breast: Secondary | ICD-10-CM

## 2013-03-23 DIAGNOSIS — G4700 Insomnia, unspecified: Secondary | ICD-10-CM | POA: Insufficient documentation

## 2013-03-25 ENCOUNTER — Other Ambulatory Visit: Payer: Self-pay

## 2013-03-25 ENCOUNTER — Ambulatory Visit
Admission: RE | Admit: 2013-03-25 | Discharge: 2013-03-25 | Disposition: A | Discharge status: Home / Self Care | Payer: Medicare Other | Source: Ambulatory Visit

## 2013-03-25 DIAGNOSIS — Z1231 Encounter for screening mammogram for malignant neoplasm of breast: Secondary | ICD-10-CM

## 2013-12-22 ENCOUNTER — Encounter: Payer: Self-pay | Admitting: Internal Medicine

## 2013-12-25 ENCOUNTER — Encounter: Payer: Self-pay | Admitting: *Deleted

## 2013-12-28 ENCOUNTER — Encounter: Payer: Self-pay | Admitting: *Deleted

## 2013-12-29 ENCOUNTER — Encounter: Payer: Self-pay | Admitting: Internal Medicine

## 2013-12-29 ENCOUNTER — Other Ambulatory Visit: Payer: Self-pay

## 2014-03-08 ENCOUNTER — Ambulatory Visit (INDEPENDENT_AMBULATORY_CARE_PROVIDER_SITE_OTHER): Payer: Medicare Other | Admitting: Internal Medicine

## 2014-03-08 ENCOUNTER — Encounter: Payer: Self-pay | Admitting: Internal Medicine

## 2014-03-08 VITALS — BP 126/72 | HR 84 | Ht 62.25 in | Wt 126.1 lb

## 2014-03-08 DIAGNOSIS — B9681 Helicobacter pylori [H. pylori] as the cause of diseases classified elsewhere: Secondary | ICD-10-CM

## 2014-03-08 DIAGNOSIS — A048 Other specified bacterial intestinal infections: Secondary | ICD-10-CM

## 2014-03-08 DIAGNOSIS — R1011 Right upper quadrant pain: Secondary | ICD-10-CM

## 2014-03-08 NOTE — Progress Notes (Signed)
[name] [birth year] [id number]  Note: This dictation was prepared with Dragon digital system. Any transcriptional errors that result from this procedure are unintentional.   History of Present Illness:  This is a 68 year old white female with a history of gastroesophageal reflux and Barrett's esophagus on upper endoscopy in 1997 but no Barrett's esophagus on subsequent endoscopies in 2000, 2003, and 2006. She is having dyspepsia, bloating and tested positive for H. pylori antibody. She underwent 2 separate courses of H. pylori treatment with mild improvement of the bloating. There is a positive family history of gallbladder disease in her mother. She had perforated diverticulitis in 2008 and underwent sigmoid resection in March 2008. She was told to have adhesions after having a hysterectomy and complicated appendectomy in the past. She has a history of hemorrhoids. Her last colonoscopy in April 2008 showed diverticulosis and a small ulcer at the sigmoid anastomosis.    Past Medical History  Diagnosis Date  . Increased pressure in the eye   . Grave's disease   . Murmur   . GERD (gastroesophageal reflux disease)   . Morton's neuroma   . Hypothyroidism   . Anxiety   . Insomnia   . Diverticulosis   . Scoliosis   . Diverticulitis   . Barrett esophagus 2003  . Glaucoma   . Hepatitis A   . H. pylori infection     Past Surgical History  Procedure Laterality Date  . Tonsillectomy    . Laparoscopic assisted vaginal hysterectomy    . Laparoscopic sigmoid colectomy  05/13/06    Dr Zella Richer  . Femur surgery Right     benign bone growth  . Lumbar disc surgery  2009  . Appendectomy  1958  . Eye surgery      due to graves disease  . Tubal ligation    . Cervical laminectomy    . Foot neuroma surgery Right     No Known Allergies  Family history and social history have been reviewed.  Review of Systems: Right upper quadrant abdominal discomfort. Dyspepsia. Bloating. Occasional  constipation. Denies dysphagia, occasional prolapsing hemorrhoid  The remainder of the 10 point ROS is negative except as outlined in the H&P  Physical Exam: General Appearance Well developed, in no distress Eyes  Non icteric  HEENT  Non traumatic, normocephalic  Mouth No lesion, tongue papillated, no cheilosis Neck Supple without adenopathy, thyroid not enlarged, no carotid bruits, no JVD Lungs Clear to auscultation bilaterally COR Normal S1, normal S2, regular rhythm, no murmur, quiet precordium Abdomen soft, nontender. Liver edge at costal margin. No palpable mass. Well-healed scar in right lower quadrant. No bruit. Rectal external hemorrhoidal tag. Stool is soft Hemoccult negative Extremities  No pedal edema Skin No lesions Neurological Alert and oriented x 3 Psychological Normal mood and affect  Assessment and Plan:   Problem #40 67 year old white female with dyspepsia and bloating which may be related to recent H. pylori infection or possibly irritable bowel syndrome or symptomatic gallbladder disease. We will proceed with an upper abdominal ultrasound. She will try ranitidine 75 mg when necessary. We will also proceed with upper endoscopy to rule out Barrett's esophagus and H. pylori gastropathy.  Problem #2 Colorectal screening. A recall colonoscopy will be set for February 2017 as she requests to have her procedure by myself before my retirement. She will begin Benefiber 1 tablespoon daily to improve her bowel habits.    [name] 03/08/2014

## 2014-03-08 NOTE — Patient Instructions (Addendum)
You have been scheduled for an endoscopy. Please follow written instructions given to you at your visit today. If you use inhalers (even only as needed), please bring them with you on the day of your procedure. Your physician has requested that you go to www.startemmi.com and enter the access code given to you at your visit today. This web site gives a general overview about your procedure. However, you should still follow specific instructions given to you by our office regarding your preparation for the procedure.  Please purchase the following medications over the counter and take as directed: Benefiber 1 tablespoon daily Zantac 75 mg as needed for heartburn  You will be due for a recall colonoscopy in 04/2015. We will send you a reminder in the mail when it gets closer to that time.  You have been scheduled for an abdominal ultrasound at [facility] (1st floor of hospital) on Monday, 03/15/14 at 9:30 am. Please arrive 15 minutes prior to your appointment for registration. Make certain not to have anything to eat or drink 6 hours prior to your appointment. Should you need to reschedule your appointment, please contact radiology at [phone]. This test typically takes about 30 minutes to perform.  CC: Dr Darene Lamer.Spear/D.Cobb PA

## 2014-03-15 ENCOUNTER — Ambulatory Visit (HOSPITAL_COMMUNITY): Payer: Medicare Other

## 2014-03-16 ENCOUNTER — Ambulatory Visit (HOSPITAL_COMMUNITY)
Admission: RE | Admit: 2014-03-16 | Discharge: 2014-03-16 | Disposition: A | Discharge status: Home / Self Care | Payer: Medicare Other | Source: Ambulatory Visit | Attending: Internal Medicine | Admitting: Internal Medicine

## 2014-03-16 DIAGNOSIS — K219 Gastro-esophageal reflux disease without esophagitis: Secondary | ICD-10-CM | POA: Diagnosis not present

## 2014-03-16 DIAGNOSIS — R1011 Right upper quadrant pain: Secondary | ICD-10-CM | POA: Insufficient documentation

## 2014-03-16 DIAGNOSIS — B159 Hepatitis A without hepatic coma: Secondary | ICD-10-CM | POA: Insufficient documentation

## 2014-03-24 ENCOUNTER — Encounter: Payer: Self-pay | Admitting: Internal Medicine

## 2014-03-24 ENCOUNTER — Ambulatory Visit (AMBULATORY_SURGERY_CENTER): Payer: Medicare Other | Admitting: Internal Medicine

## 2014-03-24 VITALS — BP 144/75 | HR 84 | Temp 97.3°F | Resp 22 | Ht 62.0 in | Wt 126.0 lb

## 2014-03-24 DIAGNOSIS — K2990 Gastroduodenitis, unspecified, without bleeding: Secondary | ICD-10-CM

## 2014-03-24 DIAGNOSIS — B9681 Helicobacter pylori [H. pylori] as the cause of diseases classified elsewhere: Secondary | ICD-10-CM

## 2014-03-24 DIAGNOSIS — R1011 Right upper quadrant pain: Secondary | ICD-10-CM

## 2014-03-24 MED ORDER — SODIUM CHLORIDE 0.9 % IV SOLN: 500.0000 mL | INTRAVENOUS | Status: DC

## 2014-03-24 MED ORDER — GLYCOPYRROLATE 1 MG PO TABS: 1.0000 mg | ORAL_TABLET | Freq: Three times a day (TID) | ORAL | Status: DC | PRN

## 2014-03-24 NOTE — Op Note (Addendum)
[place]  [address] [place], [address]   ENDOSCOPY PROCEDURE REPORT  PATIENT: [name], [name]  MR#: [id number] BIRTHDATE: [birth year] , 37  yrs. old GENDER: female ENDOSCOPIST: Lafayette Dragon, MD REFERRED BY: Dr Reita Cliche PROCEDURE DATE:  03/24/2014 PROCEDURE:  EGD w/ biopsy ASA CLASS:     Class II INDICATIONS:  right upper quadrant abdominal pain.  Gastroesophageal reflux disease.  Barrett's esophagus diagnosed in 1997.  No Barrett's esophagus on subsequent endoscopies in 2000, 2003, 2006. Upper abdominal ultrasound shows normal common bile duct and liver. 6 mm liver cyst.  She was recently treated for positive H.  pylori antibody. MEDICATIONS: Monitored anesthesia care and Propofol 100 mg IV TOPICAL ANESTHETIC: none  DESCRIPTION OF PROCEDURE: After the risks benefits and alternatives of the procedure were thoroughly explained, informed consent was obtained.  The LB [id number] endoscope was introduced through the mouth and advanced to the second portion of the duodenum , Without limitations.  The instrument was slowly withdrawn as the mucosa was fully examined.    Esophagus: proximal, mid and distal esophageal mucosa was normal. Squamocolumnar junction was slightly irregular. Biopsies were taken from Z line to rule out Barrett's esophagus. There was no hiatal hernia Stomach : gastric folds were normal. Gastric antrum and pyloric outlet was unremarkable. Retroflexion of the endoscope revealed normal fundus and cardia Duodenum: duodenal biopsies showed multiple erosions and patchy self erythema consistent with duodenitis. Biopsies were obtained to rule out H. pylori. Descending duodenum was normal[          The scope was then withdrawn from the patient and the procedure completed.  COMPLICATIONS: There were no immediate complications.  ENDOSCOPIC IMPRESSION: mild nonspecific duodenitis. Status post biopsies to rule out H. pylori History  of Barrett's esophagus. Biopsies from GE junction to rule out Barrett's esophagus  RECOMMENDATIONS: 1.  Await pathology results 2.  Continue ranitidine 75 mg daily 3.Robinul 1 mg, 1 po tid when necessary bloating  REPEAT EXAM: for EGD pending biopsy results.  eSigned:  Lafayette Dragon, MD 03/24/2014 4:19 PM Revised: 03/24/2014 4:19 PM   CC:  PATIENT NAME:  [name], [name] MR#: [id number]

## 2014-03-24 NOTE — Progress Notes (Signed)
Called to room to assist during endoscopic procedure.  Patient ID and intended procedure confirmed with present staff. Received instructions for my participation in the procedure from the performing physician.  

## 2014-03-24 NOTE — Patient Instructions (Addendum)

## 2014-03-24 NOTE — Progress Notes (Signed)
Stable to RR 

## 2014-03-25 ENCOUNTER — Telehealth: Payer: Self-pay

## 2014-03-25 NOTE — Telephone Encounter (Signed)
  Follow up Call-  Call back number 03/24/2014  Post procedure Call Back phone  # [phone]  Permission to leave phone message Yes     Patient questions:  Do you have a fever, pain , or abdominal swelling? No. Pain Score  0 *  Have you tolerated food without any problems? Yes.    Have you been able to return to your normal activities? Yes.    Do you have any questions about your discharge instructions: Diet   No. Medications  No. Follow up visit  No.  Do you have questions or concerns about your Care? No.  Actions: * If pain score is 4 or above: No action needed, pain <4.

## 2014-04-01 ENCOUNTER — Other Ambulatory Visit: Payer: Self-pay | Admitting: *Deleted

## 2014-04-01 DIAGNOSIS — R768 Other specified abnormal immunological findings in serum: Secondary | ICD-10-CM

## 2014-04-01 MED ORDER — CLARITHROMYCIN 500 MG PO TABS: ORAL_TABLET | ORAL | Status: DC

## 2014-04-01 MED ORDER — OMEPRAZOLE 40 MG PO CPDR: DELAYED_RELEASE_CAPSULE | ORAL | Status: DC

## 2014-04-01 MED ORDER — AMOXICILLIN 500 MG PO TABS: ORAL_TABLET | ORAL | Status: DC

## 2014-04-14 ENCOUNTER — Other Ambulatory Visit: Payer: Self-pay

## 2014-04-14 DIAGNOSIS — Z1231 Encounter for screening mammogram for malignant neoplasm of breast: Secondary | ICD-10-CM

## 2014-04-19 ENCOUNTER — Ambulatory Visit
Admission: RE | Admit: 2014-04-19 | Discharge: 2014-04-19 | Disposition: A | Discharge status: Home / Self Care | Payer: Medicare Other | Source: Ambulatory Visit

## 2014-04-19 DIAGNOSIS — Z1231 Encounter for screening mammogram for malignant neoplasm of breast: Secondary | ICD-10-CM

## 2014-04-20 ENCOUNTER — Other Ambulatory Visit: Payer: Self-pay | Admitting: Family Medicine

## 2014-04-20 DIAGNOSIS — R928 Other abnormal and inconclusive findings on diagnostic imaging of breast: Secondary | ICD-10-CM

## 2014-04-22 ENCOUNTER — Ambulatory Visit
Admission: RE | Admit: 2014-04-22 | Discharge: 2014-04-22 | Disposition: A | Discharge status: Home / Self Care | Payer: Medicare Other | Source: Ambulatory Visit | Attending: Family Medicine | Admitting: Family Medicine

## 2014-04-22 DIAGNOSIS — R928 Other abnormal and inconclusive findings on diagnostic imaging of breast: Secondary | ICD-10-CM

## 2014-04-26 ENCOUNTER — Ambulatory Visit: Payer: Medicare Other

## 2014-04-27 ENCOUNTER — Other Ambulatory Visit: Payer: Medicare Other

## 2014-04-28 ENCOUNTER — Telehealth: Payer: Self-pay | Admitting: Internal Medicine

## 2014-04-28 NOTE — Telephone Encounter (Signed)
Spoke with patient and told her she is to do stool study for H. Pylori 4 weeks after completing antibiotics.

## 2014-05-17 ENCOUNTER — Other Ambulatory Visit: Payer: Medicare Other

## 2014-05-17 ENCOUNTER — Telehealth: Payer: Self-pay | Admitting: *Deleted

## 2014-05-17 DIAGNOSIS — R768 Other specified abnormal immunological findings in serum: Secondary | ICD-10-CM

## 2014-05-17 NOTE — Telephone Encounter (Signed)
Patient will do the lab.

## 2014-05-17 NOTE — Telephone Encounter (Signed)
-----   Message from Hulan Saas, RN sent at 04/01/2014  8:45 AM EST ----- Call and remind patient due for h. Pylori lab on 05/17/14 for DB.

## 2014-05-18 LAB — HELICOBACTER PYLORI  SPECIAL ANTIGEN: H. PYLORI ANTIGEN STOOL: POSITIVE

## 2014-05-21 ENCOUNTER — Other Ambulatory Visit: Payer: Self-pay | Admitting: *Deleted

## 2014-05-21 DIAGNOSIS — R768 Other specified abnormal immunological findings in serum: Secondary | ICD-10-CM

## 2014-05-21 MED ORDER — BISMUTH SUBSALICYLATE 262 MG PO CHEW: CHEWABLE_TABLET | ORAL | Status: DC

## 2014-05-21 MED ORDER — OMEPRAZOLE 40 MG PO CPDR: DELAYED_RELEASE_CAPSULE | ORAL | Status: DC

## 2014-05-21 MED ORDER — METRONIDAZOLE 250 MG PO TABS: ORAL_TABLET | ORAL | Status: DC

## 2014-05-21 MED ORDER — CLARITHROMYCIN 500 MG PO TABS: ORAL_TABLET | ORAL | Status: DC

## 2014-07-06 ENCOUNTER — Other Ambulatory Visit: Payer: Medicare Other

## 2014-07-06 DIAGNOSIS — R768 Other specified abnormal immunological findings in serum: Secondary | ICD-10-CM

## 2014-07-08 LAB — H. PYLORI ANTIGEN, STOOL: H PYLORI AG STL: NEGATIVE

## 2014-09-06 ENCOUNTER — Other Ambulatory Visit: Payer: Self-pay

## 2014-10-07 ENCOUNTER — Emergency Department (HOSPITAL_COMMUNITY)
Admission: EM | Admit: 2014-10-07 | Discharge: 2014-10-07 | Disposition: A | Discharge status: Home / Self Care | Payer: Medicare Other | Attending: Emergency Medicine | Admitting: Emergency Medicine

## 2014-10-07 ENCOUNTER — Emergency Department (HOSPITAL_COMMUNITY): Payer: Medicare Other

## 2014-10-07 ENCOUNTER — Encounter (HOSPITAL_COMMUNITY): Payer: Self-pay | Admitting: Vascular Surgery

## 2014-10-07 DIAGNOSIS — M419 Scoliosis, unspecified: Secondary | ICD-10-CM | POA: Insufficient documentation

## 2014-10-07 DIAGNOSIS — R1030 Lower abdominal pain, unspecified: Secondary | ICD-10-CM | POA: Insufficient documentation

## 2014-10-07 DIAGNOSIS — K5900 Constipation, unspecified: Secondary | ICD-10-CM | POA: Diagnosis not present

## 2014-10-07 DIAGNOSIS — Z8659 Personal history of other mental and behavioral disorders: Secondary | ICD-10-CM | POA: Insufficient documentation

## 2014-10-07 DIAGNOSIS — R011 Cardiac murmur, unspecified: Secondary | ICD-10-CM | POA: Insufficient documentation

## 2014-10-07 DIAGNOSIS — K219 Gastro-esophageal reflux disease without esophagitis: Secondary | ICD-10-CM | POA: Insufficient documentation

## 2014-10-07 DIAGNOSIS — Z8669 Personal history of other diseases of the nervous system and sense organs: Secondary | ICD-10-CM | POA: Insufficient documentation

## 2014-10-07 DIAGNOSIS — R5383 Other fatigue: Secondary | ICD-10-CM | POA: Diagnosis not present

## 2014-10-07 DIAGNOSIS — E039 Hypothyroidism, unspecified: Secondary | ICD-10-CM | POA: Insufficient documentation

## 2014-10-07 DIAGNOSIS — R112 Nausea with vomiting, unspecified: Secondary | ICD-10-CM | POA: Insufficient documentation

## 2014-10-07 DIAGNOSIS — Z87891 Personal history of nicotine dependence: Secondary | ICD-10-CM | POA: Diagnosis not present

## 2014-10-07 DIAGNOSIS — R109 Unspecified abdominal pain: Secondary | ICD-10-CM

## 2014-10-07 LAB — COMPREHENSIVE METABOLIC PANEL
ALBUMIN: 4.3 g/dL (ref 3.5–5.0)
ALK PHOS: 103 U/L (ref 38–126)
ALT: 24 U/L (ref 14–54)
ANION GAP: 10 (ref 5–15)
AST: 27 U/L (ref 15–41)
BUN: 14 mg/dL (ref 6–20)
CHLORIDE: 99 mmol/L — AB (ref 101–111)
CO2: 27 mmol/L (ref 22–32)
Calcium: 9.9 mg/dL (ref 8.9–10.3)
Creatinine, Ser: 0.62 mg/dL (ref 0.44–1.00)
GFR calc non Af Amer: >60 mL/min (ref >60)
GLUCOSE: 116 mg/dL — AB (ref 65–99)
Potassium: 4.5 mmol/L (ref 3.5–5.1)
Sodium: 136 mmol/L (ref 135–145)
Total Bilirubin: 0.7 mg/dL (ref 0.3–1.2)
Total Protein: 7 g/dL (ref 6.5–8.1)

## 2014-10-07 LAB — CBC
HCT: 46.3 % — ABNORMAL HIGH (ref 36.0–46.0)
HEMOGLOBIN: 15.3 g/dL — AB (ref 12.0–15.0)
MCH: 29.7 pg (ref 26.0–34.0)
MCHC: 33 g/dL (ref 30.0–36.0)
MCV: 89.9 fL (ref 78.0–100.0)
Platelets: 253 10*3/uL (ref 150–400)
RBC: 5.15 MIL/uL — AB (ref 3.87–5.11)
RDW: 12.8 % (ref 11.5–15.5)
WBC: 10.5 10*3/uL (ref 4.0–10.5)

## 2014-10-07 LAB — URINALYSIS, ROUTINE W REFLEX MICROSCOPIC
Bilirubin Urine: NEGATIVE
Glucose, UA: NEGATIVE mg/dL
Hgb urine dipstick: NEGATIVE
Ketones, ur: NEGATIVE mg/dL
Leukocytes, UA: NEGATIVE
Nitrite: NEGATIVE
Protein, ur: NEGATIVE mg/dL
Specific Gravity, Urine: 1.038 — ABNORMAL HIGH (ref 1.005–1.030)
UROBILINOGEN UA: 0.2 mg/dL (ref 0.0–1.0)
pH: 6 (ref 5.0–8.0)

## 2014-10-07 LAB — POC OCCULT BLOOD, ED: Fecal Occult Bld: NEGATIVE

## 2014-10-07 LAB — LIPASE, BLOOD: Lipase: 28 U/L (ref 22–51)

## 2014-10-07 LAB — I-STAT CG4 LACTIC ACID, ED: Lactic Acid, Venous: 0.76 mmol/L (ref 0.5–2.0)

## 2014-10-07 MED ORDER — IOHEXOL 300 MG/ML  SOLN
25.0000 mL | Freq: Once | INTRAMUSCULAR | Status: AC | PRN
Start: 2014-10-07 — End: 2014-10-07
  Administered 2014-10-07: 25 mL via ORAL

## 2014-10-07 MED ORDER — ONDANSETRON HCL 4 MG PO TABS: 4.0000 mg | ORAL_TABLET | Freq: Four times a day (QID) | ORAL | Status: DC

## 2014-10-07 MED ORDER — DICYCLOMINE HCL 20 MG PO TABS: 20.0000 mg | ORAL_TABLET | Freq: Two times a day (BID) | ORAL | Status: DC

## 2014-10-07 MED ORDER — IOHEXOL 300 MG/ML  SOLN
100.0000 mL | Freq: Once | INTRAMUSCULAR | Status: AC | PRN
  Administered 2014-10-07: 100 mL via INTRAVENOUS

## 2014-10-07 NOTE — Discharge Instructions (Signed)
Abdominal Pain Your CT, labs, and urine sample are normal. Follow up with your doctor. Return the ED if you develop new or worsening symptoms. Many things can cause abdominal pain. Usually, abdominal pain is not caused by a disease and will improve without treatment. It can often be observed and treated at home. Your health care provider will do a physical exam and possibly order blood tests and X-rays to help determine the seriousness of your pain. However, in many cases, more time must pass before a clear cause of the pain can be found. Before that point, your health care provider may not know if you need more testing or further treatment. HOME CARE INSTRUCTIONS  Monitor your abdominal pain for any changes. The following actions may help to alleviate any discomfort you are experiencing:  Only take over-the-counter or prescription medicines as directed by your health care provider.  Do not take laxatives unless directed to do so by your health care provider.  Try a clear liquid diet (broth, tea, or water) as directed by your health care provider. Slowly move to a bland diet as tolerated. SEEK MEDICAL CARE IF:  You have unexplained abdominal pain.  You have abdominal pain associated with nausea or diarrhea.  You have pain when you urinate or have a bowel movement.  You experience abdominal pain that wakes you in the night.  You have abdominal pain that is worsened or improved by eating food.  You have abdominal pain that is worsened with eating fatty foods.  You have a fever. SEEK IMMEDIATE MEDICAL CARE IF:   Your pain does not go away within 2 hours.  You keep throwing up (vomiting).  Your pain is felt only in portions of the abdomen, such as the right side or the left lower portion of the abdomen.  You pass bloody or black tarry stools. MAKE SURE YOU:  Understand these instructions.   Will watch your condition.   Will get help right away if you are not doing well or get  worse.  Document Released: 12/06/2004 Document Revised: 03/03/2013 Document Reviewed: 11/05/2012 [facility] Patient Information 2015 [place], [place]. This information is not intended to replace advice given to you by your health care provider. Make sure you discuss any questions you have with your health care provider.

## 2014-10-07 NOTE — ED Provider Notes (Signed)
CSN: [id number]     Arrival date & time 10/07/14  1458 History   First MD Initiated Contact with Patient 10/07/14 1641     Chief Complaint  Patient presents with  . Abdominal Pain     (Consider location/radiation/quality/duration/timing/severity/associated sxs/prior Treatment) HPI Comments: Lower abdominal pain and cramping since this morning. Pain is constant. She has a sensation that she is to have a bowel movement but when she attempts nothing comes out. She reports a small amount of stool only today. Not passing gas. One episode of vomiting with nausea. History of appendectomy, hysterectomy, sigmoidectomy for diverticulitis. She is worried she has a bowel obstruction. Denies any chest pain or shortness of breath. Had some sharp intermittent right upper quadrant pain over the past day which is now resolved. No fever. No urinary or vaginal symptoms. She has not had pain like this in the past. Denies any blood in her stool.  The history is provided by the patient.    Past Medical History  Diagnosis Date  . Increased pressure in the eye   . Grave's disease   . Murmur   . GERD (gastroesophageal reflux disease)   . Morton's neuroma   . Hypothyroidism   . Anxiety   . Insomnia   . Diverticulosis   . Scoliosis   . Diverticulitis   . Barrett esophagus 2003  . Glaucoma   . Hepatitis A   . H. pylori infection    Past Surgical History  Procedure Laterality Date  . Tonsillectomy    . Laparoscopic assisted vaginal hysterectomy    . Laparoscopic sigmoid colectomy  05/13/06    Dr Zella Richer  . Femur surgery Right     benign bone growth  . Lumbar disc surgery  2009  . Appendectomy  1958  . Eye surgery      due to graves disease  . Tubal ligation    . Cervical laminectomy    . Foot neuroma surgery Right    Family History  Problem Relation Age of Onset  . Other Father 26    SEPSIS  . Hypertension Father   . Heart failure Mother 63  . Heart murmur Mother   . Diabetes Brother   .  Heart attack Sister 40  . Breast cancer Sister   . Cirrhosis Brother   . Hypertension Sister     x 2  . Diabetes Sister   . Heart murmur Daughter   . Stroke Father 14    CVA   History  Substance Use Topics  . Smoking status: Former Smoker    Types: Cigarettes    Quit date: 03/12/1978  . Smokeless tobacco: Never Used     Comment: quit in 1978  . Alcohol Use: 0.0 oz/week    0 Standard drinks or equivalent per week     Comment: SOCIALLY   OB History    No data available     Review of Systems  Constitutional: Positive for activity change and fatigue. Negative for fever.  HENT: Negative for dental problem and nosebleeds.   Respiratory: Negative for cough, chest tightness and shortness of breath.   Cardiovascular: Negative for chest pain.  Gastrointestinal: Positive for nausea, vomiting, abdominal pain and constipation. Negative for diarrhea.  Genitourinary: Negative for dysuria, vaginal bleeding and vaginal discharge.  Musculoskeletal: Negative for myalgias and back pain.  Skin: Negative for rash.  Neurological: Negative for dizziness, weakness, light-headedness and headaches.  A complete 10 system review of systems was obtained and all systems  are negative except as noted in the HPI and PMH.      Allergies  Review of patient's allergies indicates no known allergies.  Home Medications   Prior to Admission medications   Medication Sig Start Date End Date Taking? Authorizing Provider  ARMOUR THYROID 15 MG tablet Take 1 tablet by mouth 2 (two) times a week. Take in combination with 60 mg on Saturday and Sunday to equal 75 mg 01/06/14  Yes Historical Provider, MD  ARMOUR THYROID 60 MG tablet Take 1 tablet by mouth daily. Take 60 mg by mouth Monday - Friday (alone). Take 60 mg + 15 mg on Saturday and Sunday. 01/26/14  Yes Historical Provider, MD  dorzolamide (TRUSOPT) 2 % ophthalmic solution Place 1 drop into both eyes daily.   Yes Historical Provider, MD   HYDROcodone-acetaminophen (NORCO) 7.5-325 MG per tablet Take 0.5-1 tablets by mouth every 6 (six) hours as needed for moderate pain or severe pain.  07/16/14  Yes Historical Provider, MD  latanoprost (XALATAN) 0.005 % ophthalmic solution Place 1 drop into both eyes at bedtime.   Yes Historical Provider, MD  Multiple Vitamin (MULTIVITAMIN) tablet Take 1 tablet by mouth daily.   Yes Historical Provider, MD  amoxicillin (AMOXIL) 500 MG tablet Take one po TID x 14 days Patient not taking: Reported on 10/07/2014 04/01/14   Lafayette Dragon, MD  bismuth subsalicylate (PEPTO-BISMOL) 262 MG chewable tablet Take 2 tablets po  QID x 14 days Patient not taking: Reported on 10/07/2014 05/21/14   Lafayette Dragon, MD  clarithromycin Binnie Rail) 500 MG tablet Take one po BID x 14 days Patient not taking: Reported on 10/07/2014 05/21/14   Lafayette Dragon, MD  dicyclomine (BENTYL) 20 MG tablet Take 1 tablet (20 mg total) by mouth 2 (two) times daily. 10/07/14   Ezequiel Essex, MD  glycopyrrolate (ROBINUL) 1 MG tablet Take 1 tablet (1 mg total) by mouth 3 (three) times daily between meals as needed. Patient not taking: Reported on 10/07/2014 03/24/14   Lafayette Dragon, MD  metroNIDAZOLE (FLAGYL) 250 MG tablet Take one po QID x 14 days Patient not taking: Reported on 10/07/2014 05/21/14   Lafayette Dragon, MD  omeprazole (PRILOSEC) 40 MG capsule Take one po BID x 14 days Patient not taking: Reported on 10/07/2014 05/21/14   Lafayette Dragon, MD  ondansetron (ZOFRAN) 4 MG tablet Take 1 tablet (4 mg total) by mouth every 6 (six) hours. 10/07/14   Ezequiel Essex, MD   BP 150/80 mmHg  Pulse 76  Temp(Src) 97.4 F (36.3 C) (Oral)  Resp 18  SpO2 99% Physical Exam  Constitutional: She is oriented to person, place, and time. She appears well-developed and well-nourished. No distress.  HENT:  Head: Normocephalic and atraumatic.  Mouth/Throat: Oropharynx is clear and moist. No oropharyngeal exudate.  Eyes: Conjunctivae and EOM are normal. Pupils  are equal, round, and reactive to light.  Neck: Normal range of motion. Neck supple.  No meningismus.  Cardiovascular: Normal rate, regular rhythm, normal heart sounds and intact distal pulses.   No murmur heard. Pulmonary/Chest: Effort normal and breath sounds normal. No respiratory distress.  Abdominal: Soft. There is tenderness. There is no rebound and no guarding.  Diffuse lower abdominal tenderness. No guarding or rebound  Genitourinary:  Chaperone present, no fecal impaction. No external hemorrhoids  Musculoskeletal: Normal range of motion. She exhibits no edema or tenderness.  No CVAT  Neurological: She is alert and oriented to person, place, and time. No cranial nerve  deficit. She exhibits normal muscle tone. Coordination normal.  No ataxia on finger to nose bilaterally. No pronator drift. 5/5 strength throughout. CN 2-12 intact. Equal grip strength. Sensation intact.   Skin: Skin is warm.  Psychiatric: She has a normal mood and affect. Her behavior is normal.  Nursing note and vitals reviewed.   ED Course  Procedures (including critical care time) Labs Review Labs Reviewed  COMPREHENSIVE METABOLIC PANEL - Abnormal; Notable for the following:    Chloride 99 (*)    Glucose, Bld 116 (*)    All other components within normal limits  CBC - Abnormal; Notable for the following:    RBC 5.15 (*)    Hemoglobin 15.3 (*)    HCT 46.3 (*)    All other components within normal limits  URINALYSIS, ROUTINE W REFLEX MICROSCOPIC (NOT AT [facility]) - Abnormal; Notable for the following:    Specific Gravity, Urine 1.038 (*)    All other components within normal limits  LIPASE, BLOOD  URINALYSIS, ROUTINE W REFLEX MICROSCOPIC (NOT AT [facility])  POC OCCULT BLOOD, ED  I-STAT CG4 LACTIC ACID, ED  I-STAT CG4 LACTIC ACID, ED    Imaging Review Ct Abdomen Pelvis W Contrast  10/07/2014   CLINICAL DATA:  Lower abdomen pain with nausea and vomiting starting today.  EXAM: CT ABDOMEN AND PELVIS WITH  CONTRAST  TECHNIQUE: Multidetector CT imaging of the abdomen and pelvis was performed using the standard protocol following bolus administration of intravenous contrast.  CONTRAST:  140mL OMNIPAQUE IOHEXOL 300 MG/ML  SOLN  COMPARISON:  None.  FINDINGS: There is a 3 mm cyst in the right lobe liver. There is a calcified granuloma in the liver. The liver is otherwise normal. The spleen, pancreas, gallbladder, adrenal glands and kidneys are normal. There is no hydronephrosis bilaterally. There is atherosclerosis of the abdominal aorta without aneurysmal dilatation. There is no abdominal lymphadenopathy. There is no small bowel obstruction or diverticulitis. The appendix is normal. Patient is status post prior surgery of the sigmoid colon. There is minimal hiatal hernia.  Fluid-filled bladder is normal. There is no pelvic lymphadenopathy. The lung bases are clear. Degenerative joint changes of the spine are noted.  IMPRESSION: No acute abnormality identified in the abdomen and pelvis. There is no bowel obstruction.   Electronically Signed   By: Abelardo Diesel M.D.   On: 10/07/2014 18:25     EKG Interpretation None      MDM   Final diagnoses:  Abdominal pain, unspecified abdominal location   Lower abdominal cramping and pain since yesterday with tenesmus. Only able to have small bowel movement. Not passing gas. Nausea with one episode of vomiting. No fever. Patient concern for bowel obstruction.  No fecal impaction. Abdomen is soft. No peritoneal signs. Labs normal including lactate. Urinalysis negative.  CT scan shows no acute abnormality. No obstruction.  Patient still complains of intermittent abdominal cramping. We'll treat symptomatically. Follow-up with PCP. Return precautions discussed.  Ezequiel Essex, MD 10/08/14 [id number]

## 2014-10-07 NOTE — ED Notes (Signed)
Pt reports to the ED for eval of lower abd pain and cramping. She reports the pain began yesterday. She reports the pain started in the right upper quadrant. She reports that the pain feels like when she needs to have a BM but when she goes to have a BM not much comes out and the pain becomes even more severe. Pt also reports nausea and one episode of emesis. Pt has hx of abd adhesions, diverticulitis, and a sigmodiectomy. Pt wants to r/o SBO. Pt A&Ox4, resp e/u, and skin warm and dry.

## 2014-11-29 LAB — HM DEXA SCAN

## 2014-12-11 DIAGNOSIS — I639 Cerebral infarction, unspecified: Secondary | ICD-10-CM

## 2014-12-11 HISTORY — DX: Cerebral infarction, unspecified: I63.9

## 2014-12-26 ENCOUNTER — Emergency Department (HOSPITAL_COMMUNITY): Payer: Medicare Other

## 2014-12-26 ENCOUNTER — Emergency Department (HOSPITAL_BASED_OUTPATIENT_CLINIC_OR_DEPARTMENT_OTHER): Payer: Medicare Other

## 2014-12-26 ENCOUNTER — Encounter (HOSPITAL_BASED_OUTPATIENT_CLINIC_OR_DEPARTMENT_OTHER): Payer: Self-pay

## 2014-12-26 ENCOUNTER — Inpatient Hospital Stay (HOSPITAL_BASED_OUTPATIENT_CLINIC_OR_DEPARTMENT_OTHER)
Admission: EM | Admit: 2014-12-26 | Discharge: 2014-12-28 | DRG: 066 | Disposition: A | Discharge status: Home / Self Care | Payer: Medicare Other | Attending: Internal Medicine | Admitting: Internal Medicine

## 2014-12-26 DIAGNOSIS — Z833 Family history of diabetes mellitus: Secondary | ICD-10-CM

## 2014-12-26 DIAGNOSIS — F419 Anxiety disorder, unspecified: Secondary | ICD-10-CM | POA: Diagnosis present

## 2014-12-26 DIAGNOSIS — Z803 Family history of malignant neoplasm of breast: Secondary | ICD-10-CM

## 2014-12-26 DIAGNOSIS — I63211 Cerebral infarction due to unspecified occlusion or stenosis of right vertebral arteries: Principal | ICD-10-CM | POA: Diagnosis present

## 2014-12-26 DIAGNOSIS — K219 Gastro-esophageal reflux disease without esophagitis: Secondary | ICD-10-CM | POA: Diagnosis present

## 2014-12-26 DIAGNOSIS — I63011 Cerebral infarction due to thrombosis of right vertebral artery: Secondary | ICD-10-CM | POA: Insufficient documentation

## 2014-12-26 DIAGNOSIS — E890 Postprocedural hypothyroidism: Secondary | ICD-10-CM | POA: Insufficient documentation

## 2014-12-26 DIAGNOSIS — I63111 Cerebral infarction due to embolism of right vertebral artery: Secondary | ICD-10-CM | POA: Diagnosis not present

## 2014-12-26 DIAGNOSIS — H5340 Unspecified visual field defects: Secondary | ICD-10-CM | POA: Diagnosis not present

## 2014-12-26 DIAGNOSIS — H547 Unspecified visual loss: Secondary | ICD-10-CM

## 2014-12-26 DIAGNOSIS — I639 Cerebral infarction, unspecified: Secondary | ICD-10-CM | POA: Diagnosis not present

## 2014-12-26 DIAGNOSIS — E785 Hyperlipidemia, unspecified: Secondary | ICD-10-CM | POA: Diagnosis present

## 2014-12-26 DIAGNOSIS — E039 Hypothyroidism, unspecified: Secondary | ICD-10-CM | POA: Diagnosis present

## 2014-12-26 DIAGNOSIS — I6509 Occlusion and stenosis of unspecified vertebral artery: Secondary | ICD-10-CM | POA: Insufficient documentation

## 2014-12-26 DIAGNOSIS — Z8249 Family history of ischemic heart disease and other diseases of the circulatory system: Secondary | ICD-10-CM

## 2014-12-26 DIAGNOSIS — J309 Allergic rhinitis, unspecified: Secondary | ICD-10-CM | POA: Diagnosis present

## 2014-12-26 DIAGNOSIS — Z823 Family history of stroke: Secondary | ICD-10-CM

## 2014-12-26 DIAGNOSIS — J3089 Other allergic rhinitis: Secondary | ICD-10-CM | POA: Diagnosis not present

## 2014-12-26 DIAGNOSIS — I6501 Occlusion and stenosis of right vertebral artery: Secondary | ICD-10-CM | POA: Diagnosis not present

## 2014-12-26 DIAGNOSIS — H409 Unspecified glaucoma: Secondary | ICD-10-CM | POA: Diagnosis present

## 2014-12-26 DIAGNOSIS — R51 Headache: Secondary | ICD-10-CM | POA: Diagnosis not present

## 2014-12-26 DIAGNOSIS — Z87891 Personal history of nicotine dependence: Secondary | ICD-10-CM

## 2014-12-26 HISTORY — DX: Cerebral infarction, unspecified: I63.9

## 2014-12-26 LAB — CREATININE, SERUM
Creatinine, Ser: 0.65 mg/dL (ref 0.44–1.00)
GFR calc Af Amer: >60 mL/min (ref >60)

## 2014-12-26 LAB — CBC WITH DIFFERENTIAL/PLATELET
BASOS ABS: 0 10*3/uL (ref 0.0–0.1)
Basophils Relative: 0 %
EOS PCT: 4 %
Eosinophils Absolute: 0.2 10*3/uL (ref 0.0–0.7)
HCT: 45.6 % (ref 36.0–46.0)
Hemoglobin: 15 g/dL (ref 12.0–15.0)
LYMPHS ABS: 2.8 10*3/uL (ref 0.7–4.0)
Lymphocytes Relative: 42 %
MCH: 29.9 pg (ref 26.0–34.0)
MCHC: 32.9 g/dL (ref 30.0–36.0)
MCV: 90.8 fL (ref 78.0–100.0)
MONO ABS: 0.7 10*3/uL (ref 0.1–1.0)
Monocytes Relative: 11 %
Neutro Abs: 2.8 10*3/uL (ref 1.7–7.7)
Neutrophils Relative %: 43 %
PLATELETS: 267 10*3/uL (ref 150–400)
RBC: 5.02 MIL/uL (ref 3.87–5.11)
RDW: 12.6 % (ref 11.5–15.5)
WBC: 6.6 10*3/uL (ref 4.0–10.5)

## 2014-12-26 LAB — CBC
HEMATOCRIT: 46.4 % — AB (ref 36.0–46.0)
HEMOGLOBIN: 15.2 g/dL — AB (ref 12.0–15.0)
MCH: 29.8 pg (ref 26.0–34.0)
MCHC: 32.8 g/dL (ref 30.0–36.0)
MCV: 91 fL (ref 78.0–100.0)
Platelets: 273 10*3/uL (ref 150–400)
RBC: 5.1 MIL/uL (ref 3.87–5.11)
RDW: 12.6 % (ref 11.5–15.5)
WBC: 5.7 10*3/uL (ref 4.0–10.5)

## 2014-12-26 LAB — GLUCOSE, CAPILLARY: GLUCOSE-CAPILLARY: 93 mg/dL (ref 65–99)

## 2014-12-26 LAB — BASIC METABOLIC PANEL
Anion gap: 7 (ref 5–15)
BUN: 17 mg/dL (ref 6–20)
CHLORIDE: 104 mmol/L (ref 101–111)
CO2: 26 mmol/L (ref 22–32)
Calcium: 9.7 mg/dL (ref 8.9–10.3)
Creatinine, Ser: 0.73 mg/dL (ref 0.44–1.00)
GFR calc Af Amer: >60 mL/min (ref >60)
GFR calc non Af Amer: >60 mL/min (ref >60)
GLUCOSE: 100 mg/dL — AB (ref 65–99)
POTASSIUM: 4.8 mmol/L (ref 3.5–5.1)
SODIUM: 137 mmol/L (ref 135–145)

## 2014-12-26 LAB — APTT: aPTT: 28 seconds (ref 24–37)

## 2014-12-26 LAB — PROTIME-INR
INR: 0.95 (ref 0.00–1.49)
PROTHROMBIN TIME: 12.9 s (ref 11.6–15.2)

## 2014-12-26 LAB — TSH: TSH: 1.014 u[IU]/mL (ref 0.350–4.500)

## 2014-12-26 MED ORDER — THYROID 60 MG PO TABS
60.0000 mg | ORAL_TABLET | Freq: Every day | ORAL | Status: DC
  Administered 2014-12-27 – 2014-12-28 (×2): 60 mg via ORAL
  Filled 2014-12-26 (×3): qty 1

## 2014-12-26 MED ORDER — THYROID 30 MG PO TABS: 15.0000 mg | ORAL_TABLET | ORAL | Status: DC

## 2014-12-26 MED ORDER — TETRACAINE HCL 0.5 % OP SOLN
OPHTHALMIC | Status: AC
  Administered 2014-12-26: 14:00:00
  Filled 2014-12-26: qty 2

## 2014-12-26 MED ORDER — ACETAMINOPHEN 325 MG PO TABS
650.0000 mg | ORAL_TABLET | Freq: Once | ORAL | Status: AC
  Administered 2014-12-26: 650 mg via ORAL
  Filled 2014-12-26: qty 2

## 2014-12-26 MED ORDER — DIAZEPAM 5 MG PO TABS
5.0000 mg | ORAL_TABLET | Freq: Every evening | ORAL | Status: DC | PRN
  Administered 2014-12-27 (×2): 5 mg via ORAL
  Filled 2014-12-26 (×2): qty 1

## 2014-12-26 MED ORDER — ONE-DAILY MULTI VITAMINS PO TABS: 1.0000 | ORAL_TABLET | Freq: Every day | ORAL | Status: DC

## 2014-12-26 MED ORDER — ASPIRIN 325 MG PO TABS
325.0000 mg | ORAL_TABLET | Freq: Every day | ORAL | Status: DC
  Administered 2014-12-26 – 2014-12-27 (×2): 325 mg via ORAL
  Filled 2014-12-26 (×2): qty 1

## 2014-12-26 MED ORDER — LATANOPROST 0.005 % OP SOLN
1.0000 [drp] | Freq: Every day | OPHTHALMIC | Status: DC
  Administered 2014-12-26 – 2014-12-27 (×2): 1 [drp] via OPHTHALMIC
  Filled 2014-12-26: qty 2.5

## 2014-12-26 MED ORDER — ACETAMINOPHEN 650 MG RE SUPP: 650.0000 mg | RECTAL | Status: DC | PRN

## 2014-12-26 MED ORDER — SENNOSIDES-DOCUSATE SODIUM 8.6-50 MG PO TABS: 1.0000 | ORAL_TABLET | Freq: Every evening | ORAL | Status: DC | PRN

## 2014-12-26 MED ORDER — ASPIRIN 300 MG RE SUPP: 300.0000 mg | Freq: Every day | RECTAL | Status: DC

## 2014-12-26 MED ORDER — THYROID 30 MG PO TABS
15.0000 mg | ORAL_TABLET | ORAL | Status: DC
  Filled 2014-12-26: qty 1

## 2014-12-26 MED ORDER — HEPARIN SODIUM (PORCINE) 5000 UNIT/ML IJ SOLN
5000.0000 [IU] | Freq: Three times a day (TID) | INTRAMUSCULAR | Status: DC
  Administered 2014-12-26 – 2014-12-28 (×5): 5000 [IU] via SUBCUTANEOUS
  Filled 2014-12-26 (×5): qty 1

## 2014-12-26 MED ORDER — PANTOPRAZOLE SODIUM 40 MG PO TBEC
40.0000 mg | DELAYED_RELEASE_TABLET | Freq: Every day | ORAL | Status: DC
Start: 2014-12-27 — End: 2014-12-28
  Administered 2014-12-27 – 2014-12-28 (×2): 40 mg via ORAL
  Filled 2014-12-26 (×2): qty 1

## 2014-12-26 MED ORDER — HYDROCODONE-ACETAMINOPHEN 7.5-325 MG PO TABS: 0.5000 | ORAL_TABLET | Freq: Four times a day (QID) | ORAL | Status: DC | PRN

## 2014-12-26 MED ORDER — ACETAMINOPHEN 325 MG PO TABS
650.0000 mg | ORAL_TABLET | ORAL | Status: DC | PRN
  Administered 2014-12-27: 650 mg via ORAL
  Filled 2014-12-26: qty 2

## 2014-12-26 MED ORDER — DORZOLAMIDE HCL 2 % OP SOLN
1.0000 [drp] | Freq: Every day | OPHTHALMIC | Status: DC
  Administered 2014-12-27 – 2014-12-28 (×2): 1 [drp] via OPHTHALMIC
  Filled 2014-12-26: qty 10

## 2014-12-26 MED ORDER — STROKE: EARLY STAGES OF RECOVERY BOOK
Freq: Once | Status: AC
  Administered 2014-12-26: 1

## 2014-12-26 MED ORDER — FLUTICASONE PROPIONATE 50 MCG/ACT NA SUSP: 2.0000 | Freq: Two times a day (BID) | NASAL | Status: DC | PRN

## 2014-12-26 NOTE — ED Notes (Addendum)
Patient here with right sided frontal head pain since Wednesday. Also complains of left sided vision loss intermittent x 2 days, also complains of right sided neck pain. Patient thinks related to sinus problems. Alert and oriented. To treatment room with equal and steady gait.

## 2014-12-26 NOTE — ED Notes (Signed)
Per pt reports has vision changes in lt eye and rt neck pain . Lt eye has diminished vison filed to lateral side. Reports taking drops for glaucoma. See primary eye physician about two weeks ago with no changes. Reports being able to see the light when shined in her eye. Rt neck pressure/strain increases with movement. Reports no fall, lifting anything  heavy.  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 Verbalized concern regarding the vision changes.

## 2014-12-26 NOTE — ED Notes (Signed)
Bed: WA02 Expected date:  Expected time:  Means of arrival:  Comments: tx [facility] MRI vision loss

## 2014-12-26 NOTE — ED Provider Notes (Signed)
CSN: [id number]     Arrival date & time 12/26/14  1133 History   First MD Initiated Contact with Patient 12/26/14 1155     Chief Complaint  Patient presents with  . Facial Pain     (Consider location/radiation/quality/duration/timing/severity/associated sxs/prior Treatment) HPI Comments: Pt here after being transferred from [facility] for an mri of the head and neck ( had a negative head ct pta) She has been experiencing transient headaches and left right and left eye vision loss Has a h/o occular migraines and this is different--no fever, emesis--sx worse with leaning forward  Denies upper or lower extrem weakness, no confusion or slurred speech Currently denies any vision loss No tx given pta  The history is provided by the patient.    Past Medical History  Diagnosis Date  . Increased pressure in the eye   . Grave's disease   . Murmur   . GERD (gastroesophageal reflux disease)   . Morton's neuroma   . Hypothyroidism   . Anxiety   . Insomnia   . Diverticulosis   . Scoliosis   . Diverticulitis   . Barrett esophagus 2003  . Glaucoma   . Hepatitis A   . H. pylori infection    Past Surgical History  Procedure Laterality Date  . Tonsillectomy    . Laparoscopic assisted vaginal hysterectomy    . Laparoscopic sigmoid colectomy  05/13/06    Dr Zella Richer  . Femur surgery Right     benign bone growth  . Lumbar disc surgery  2009  . Appendectomy  1958  . Eye surgery      due to graves disease  . Tubal ligation    . Cervical laminectomy    . Foot neuroma surgery Right    Family History  Problem Relation Age of Onset  . Other Father 23    SEPSIS  . Hypertension Father   . Heart failure Mother 78  . Heart murmur Mother   . Diabetes Brother   . Heart attack Sister 60  . Breast cancer Sister   . Cirrhosis Brother   . Hypertension Sister     x 2  . Diabetes Sister   . Heart murmur Daughter   . Stroke Father 12    CVA   Social History  Substance Use Topics  .  Smoking status: Former Smoker    Types: Cigarettes    Quit date: 03/12/1978  . Smokeless tobacco: Never Used     Comment: quit in 1978  . Alcohol Use: 0.0 oz/week    0 Standard drinks or equivalent per week     Comment: SOCIALLY   OB History    No data available     Review of Systems  All other systems reviewed and are negative.     Allergies  Review of patient's allergies indicates no known allergies.  Home Medications   Prior to Admission medications   Medication Sig Start Date End Date Taking? Authorizing Provider  ARMOUR THYROID 15 MG tablet Take 1 tablet by mouth 2 (two) times a week. Take in combination with 60 mg on Saturday and Sunday to equal 75 mg 01/06/14   Historical Provider, MD  ARMOUR THYROID 60 MG tablet Take 1 tablet by mouth daily. Take 60 mg by mouth Monday - Friday (alone). Take 60 mg + 15 mg on Saturday and Sunday. 01/26/14   Historical Provider, MD  bismuth subsalicylate (PEPTO-BISMOL) 262 MG chewable tablet Take 2 tablets po  QID x 14 days Patient  not taking: Reported on 10/07/2014 05/21/14   Lafayette Dragon, MD  dicyclomine (BENTYL) 20 MG tablet Take 1 tablet (20 mg total) by mouth 2 (two) times daily. 10/07/14   Ezequiel Essex, MD  dorzolamide (TRUSOPT) 2 % ophthalmic solution Place 1 drop into both eyes daily.    Historical Provider, MD  glycopyrrolate (ROBINUL) 1 MG tablet Take 1 tablet (1 mg total) by mouth 3 (three) times daily between meals as needed. Patient not taking: Reported on 10/07/2014 03/24/14   Lafayette Dragon, MD  HYDROcodone-acetaminophen (Coqui) 7.5-325 MG per tablet Take 0.5-1 tablets by mouth every 6 (six) hours as needed for moderate pain or severe pain.  07/16/14   Historical Provider, MD  latanoprost (XALATAN) 0.005 % ophthalmic solution Place 1 drop into both eyes at bedtime.    Historical Provider, MD  metroNIDAZOLE (FLAGYL) 250 MG tablet Take one po QID x 14 days Patient not taking: Reported on 10/07/2014 05/21/14   Lafayette Dragon, MD   Multiple Vitamin (MULTIVITAMIN) tablet Take 1 tablet by mouth daily.    Historical Provider, MD  omeprazole (PRILOSEC) 40 MG capsule Take one po BID x 14 days Patient not taking: Reported on 10/07/2014 05/21/14   Lafayette Dragon, MD  ondansetron (ZOFRAN) 4 MG tablet Take 1 tablet (4 mg total) by mouth every 6 (six) hours. 10/07/14   Ezequiel Essex, MD   BP 133/70 mmHg  Pulse 61  Temp(Src) 98.3 F (36.8 C) (Oral)  Resp 18  Ht 5\' 2"  (1.575 m)  Wt 122 lb (55.339 kg)  BMI 22.31 kg/m2  SpO2 98% Physical Exam  Constitutional: She is oriented to person, place, and time. She appears well-developed and well-nourished.  Non-toxic appearance. No distress.  HENT:  Head: Normocephalic and atraumatic.  Eyes: Conjunctivae, EOM and lids are normal. Pupils are equal, round, and reactive to light.  Slight decreased vision left superior nasal field  Neck: Normal range of motion. Neck supple. No tracheal deviation present. No thyroid mass present.  Cardiovascular: Normal rate, regular rhythm and normal heart sounds.  Exam reveals no gallop.   No murmur heard. Pulmonary/Chest: Effort normal and breath sounds normal. No stridor. No respiratory distress. She has no decreased breath sounds. She has no wheezes. She has no rhonchi. She has no rales.  Abdominal: Soft. Normal appearance and bowel sounds are normal. She exhibits no distension. There is no tenderness. There is no rebound and no CVA tenderness.  Musculoskeletal: Normal range of motion. She exhibits no edema or tenderness.  Neurological: She is alert and oriented to person, place, and time. She has normal strength. No cranial nerve deficit or sensory deficit. GCS eye subscore is 4. GCS verbal subscore is 5. GCS motor subscore is 6.  Skin: Skin is warm and dry. No abrasion and no rash noted.  Psychiatric: She has a normal mood and affect. Her speech is normal and behavior is normal.  Nursing note and vitals reviewed.   ED Course  Procedures (including  critical care time) Labs Review Labs Reviewed - No data to display  Imaging Review Ct Head Wo Contrast  12/26/2014  CLINICAL DATA:  69 year old female with history of right-sided facial pain and right frontal headache since Thursday of this week. EXAM: CT HEAD WITHOUT CONTRAST TECHNIQUE: Contiguous axial images were obtained from the base of the skull through the vertex without intravenous contrast. COMPARISON:  No priors. FINDINGS: Patchy and confluent areas of decreased attenuation are noted throughout the deep and periventricular white matter of  the cerebral hemispheres bilaterally, compatible with chronic microvascular ischemic disease. No acute intracranial abnormalities. Specifically, no evidence of acute intracranial hemorrhage, no definite findings of acute/subacute cerebral ischemia, no mass, mass effect, hydrocephalus or abnormal intra or extra-axial fluid collections. Visualized paranasal sinuses and mastoids are well pneumatized. No acute displaced skull fractures are identified. IMPRESSION: 1. No acute intracranial abnormalities. 2. Mild chronic microvascular ischemic changes in cerebral white matter, as above. Electronically Signed   By: Vinnie Langton M.D.   On: 12/26/2014 13:10   I have personally reviewed and evaluated these images and lab results as part of my medical decision-making.   EKG Interpretation   Date/Time:  Sunday December 26 2014 17:46:42 EDT Ventricular Rate:  65 PR Interval:  153 QRS Duration: 86 QT Interval:  412 QTC Calculation: 428 R Axis:   39 Text Interpretation:  Sinus rhythm Probable left atrial enlargement  Anteroseptal infarct, age indeterminate No significant change since last  tracing Confirmed by Cimberly Stoffel  MD, Kort Stettler ([id number]) on 12/26/2014 5:50:42 PM      MDM   Final diagnoses:  Visual field loss    Patient's MRI results reviewed. Discussed with neurology on call, Dr. Nicole Kindred, who requested patient be sent to [facility] to be admitted to  the medicine service    Lacretia Leigh, MD 12/26/14 [phone]

## 2014-12-26 NOTE — ED Notes (Signed)
Patient back from CT.

## 2014-12-26 NOTE — ED Notes (Signed)
Report called to Teaching laboratory technician at [facility] ED.

## 2014-12-26 NOTE — ED Notes (Signed)
Patient transported to MRI 

## 2014-12-26 NOTE — ED Notes (Signed)
Patient returned from MRI with tech via stretcher

## 2014-12-26 NOTE — H&P (Signed)
[facility] History and Physical  NOELLA KIPNIS KGM:[id number] DOB: [birth year] DOA: 12/26/2014  Referring physician: Dr. Zenia Resides PCP: No primary care provider on file.   Chief Complaint: HA and visual field loss  HPI: [name] is a 69 y.o. female with PMH significant for anxiety, GERD, Glaucoma, Rhinitis,   hypothyroidism and diverticulosis (s/p laparoscopic sigmoid colectomy); who presented to ED for evaluation of right sided head and neck pain (throbbing in nature) and associated with left eye peripheral vision loss. Patient reported symptoms has been present since Thursday Morning and even symptoms has not exactly worsen, they have not gotten better either, reason why she came to ED for further evaluation. In ED patient has unremarkable CT scan of the head, but her MRI was positive for small acute non ischemic occipital stroke and also obstructed right vertebral artery. Patient got some tylenol for her HA and [facility] was called to admit patient for further evaluation and treatment.  Of note, she denies CP, SOB, fever, chills,nausea, vomiting, abd pain, dysuria, hematuria, hematochezia and melena.  Review of Systems:  Negative except as otherwise mentioned in HPI.  Past Medical History  Diagnosis Date  . Increased pressure in the eye   . Grave's disease   . Murmur   . GERD (gastroesophageal reflux disease)   . Morton's neuroma   . Hypothyroidism   . Anxiety   . Insomnia   . Diverticulosis   . Scoliosis   . Diverticulitis   . Barrett esophagus 2003  . Glaucoma   . Hepatitis A   . H. pylori infection    Past Surgical History  Procedure Laterality Date  . Tonsillectomy    . Laparoscopic assisted vaginal hysterectomy    . Laparoscopic sigmoid colectomy  05/13/06    Dr Zella Richer  . Femur surgery Right     benign bone growth  . Lumbar disc surgery  2009  . Appendectomy  1958  . Eye surgery      due to graves disease  . Tubal ligation    . Cervical laminectomy    .  Foot neuroma surgery Right    Social History:  reports that she quit smoking about 36 years ago. Her smoking use included Cigarettes. She has never used smokeless tobacco. She reports that she drinks alcohol. She reports that she does not use illicit drugs.  Allergies  Allergen Reactions  . Neuromuscular Blocking Agents Other (See Comments)    Multiple symptoms, SOB, fatigue, weakness    Family History  Problem Relation Age of Onset  . Other Father 96    SEPSIS  . Hypertension Father   . Heart failure Mother 42  . Heart murmur Mother   . Diabetes Brother   . Heart attack Sister 67  . Breast cancer Sister   . Cirrhosis Brother   . Hypertension Sister     x 2  . Diabetes Sister   . Heart murmur Daughter   . Stroke Father 55    CVA   Prior to Admission medications   Medication Sig Start Date End Date Taking? Authorizing Provider  ARMOUR THYROID 15 MG tablet Take 1 tablet by mouth 2 (two) times a week. Saturday and Sunday 01/06/14  Yes Historical Provider, MD  ARMOUR THYROID 60 MG tablet Take 1 tablet by mouth daily.  01/26/14  Yes Historical Provider, MD  diazepam (VALIUM) 5 MG tablet Take 5 mg by mouth at bedtime as needed for anxiety (sleep).  08/20/14  Yes Historical Provider, MD  dorzolamide (TRUSOPT) 2 % ophthalmic solution Place 1 drop into both eyes daily.   Yes Historical Provider, MD  fluticasone (FLONASE) 50 MCG/ACT nasal spray Place 2 sprays into both nostrils 2 (two) times daily as needed for allergies.  06/02/14  Yes Historical Provider, MD  HYDROcodone-acetaminophen (NORCO) 7.5-325 MG per tablet Take 0.5-1 tablets by mouth every 6 (six) hours as needed for moderate pain or severe pain.  07/16/14  Yes Historical Provider, MD  ibuprofen (ADVIL,MOTRIN) 200 MG tablet Take 400 mg by mouth every 6 (six) hours as needed for mild pain.    Yes Historical Provider, MD  latanoprost (XALATAN) 0.005 % ophthalmic solution Place 1 drop into both eyes at bedtime.   Yes Historical  Provider, MD  Multiple Vitamin (MULTIVITAMIN) tablet Take 1 tablet by mouth daily.   Yes Historical Provider, MD   Physical Exam: Filed Vitals:   12/26/14 1145 12/26/14 1500 12/26/14 1747  BP: 156/79 133/70 147/74  Pulse: 79 61 66  Temp: 97.6 F (36.4 C) 98.3 F (36.8 C)   TempSrc: Oral Oral   Resp: 18 18 20   Height: 5\' 2"  (1.575 m)    Weight: 55.339 kg (122 lb)    SpO2: 98% 98% 98%    Wt Readings from Last 3 Encounters:  12/26/14 55.339 kg (122 lb)  12/26/14 55.339 kg (122 lb)  03/24/14 57.153 kg (126 lb)    General:  Appears calm and comfortable; denies any complaints currently, except for very mild HA. Patient w/o dysarthria and with good insight. No fever. Eyes: PERRL, normal lids, irises & conjunctiva, no nystagmus, no icterus ENT: grossly normal hearing, lips & tongue, no erythema, no exudates, no thrush, no drainage out os ears or nostrils  Neck: no LAD, no bruits, no JVD Cardiovascular: RRR, no rubs or gallops. No LE edema. Telemetry: SR, no arrhythmias appreciated on ED telemetry and EKG. Respiratory: CTA bilaterally, no w/r/r. Normal respiratory effort. Abdomen: soft, nt, nd, positive BS Skin: no rash or induration seen on exam Musculoskeletal: grossly normal tone BUE/BLE, no joint swelling  Psychiatric: grossly normal mood and affect, speech fluent and appropriate Neurologic: CN grossly intact, no dysarthria, MS 5/5 bilaterally and simetrically, normal finger to nose. Midline uvula and tongue           Labs on Admission:  Basic Metabolic Panel:  Recent Labs Lab 12/26/14 1636  NA 137  K 4.8  CL 104  CO2 26  GLUCOSE 100*  BUN 17  CREATININE 0.73  CALCIUM 9.7   CBC:  Recent Labs Lab 12/26/14 1636  WBC 6.6  NEUTROABS 2.8  HGB 15.0  HCT 45.6  MCV 90.8  PLT 267   CBG: No results for input(s): GLUCAP in the last 168 hours.  Radiological Exams on Admission: Ct Head Wo Contrast  12/26/2014  CLINICAL DATA:  69 year old female with history of  right-sided facial pain and right frontal headache since Thursday of this week. EXAM: CT HEAD WITHOUT CONTRAST TECHNIQUE: Contiguous axial images were obtained from the base of the skull through the vertex without intravenous contrast. COMPARISON:  No priors. FINDINGS: Patchy and confluent areas of decreased attenuation are noted throughout the deep and periventricular white matter of the cerebral hemispheres bilaterally, compatible with chronic microvascular ischemic disease. No acute intracranial abnormalities. Specifically, no evidence of acute intracranial hemorrhage, no definite findings of acute/subacute cerebral ischemia, no mass, mass effect, hydrocephalus or abnormal intra or extra-axial fluid collections. Visualized paranasal sinuses and mastoids are well pneumatized. No acute displaced skull fractures  are identified. IMPRESSION: 1. No acute intracranial abnormalities. 2. Mild chronic microvascular ischemic changes in cerebral white matter, as above. Electronically Signed   By: Vinnie Langton M.D.   On: 12/26/2014 13:10   Mr Jodene Nam Head Wo Contrast  12/26/2014  CLINICAL DATA:  Vision loss on the left.  Right neck pain EXAM: MRI HEAD WITHOUT CONTRAST MRA HEAD WITHOUT CONTRAST TECHNIQUE: Multiplanar, multiecho pulse sequences of the brain and surrounding structures were obtained without intravenous contrast. Angiographic images of the head were obtained using MRA technique without contrast. COMPARISON:  Brain MRI 07/02/2003 FINDINGS: MRI HEAD FINDINGS Calvarium and upper cervical spine: No focal marrow signal abnormality. Orbits: No significant findings. Sinuses and Mastoids: Clear. Brain: There is a sub cm focus of restricted diffusion within the low right occipital cortex consistent with acute infarct. No hemorrhagic conversion. Loss of flow related hypo intensity in the right vertebral artery, as described below. There is mild for age small-vessel ischemic change in the bilateral cerebral white matter,  thinly confluent around around the frontal horns of the lateral ventricles. No previous cortical infarct detected. Normal cerebral volume for age. No mass lesion, hydrocephalus, or shift. MRA HEAD FINDINGS There is no flow related signal loss throughout the majority of the non dominant right vertebral artery, age indeterminate but presumably recent given the infarct. Fortunately, there is a large right AICA which is patent. No mural T1 hyperintensity on conventional imaging to suggest dissection. Symmetric left PICA and AICA which are widely patent. Symmetric superior cerebellar arteries with a high-grade proximal stenosis on the left. The posterior cerebral arteries are patent. There is no notable stenosis within the anterior circulation. No major vessel occlusion. Negative for aneurysm. An anterior communicating artery is present. No posterior communicating arteries. These results were called by telephone at the time of interpretation on 12/26/2014 at 5:38 pm to Dr. Zenia Resides , who verbally acknowledged these results. IMPRESSION: 1. 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 2. Occluded non-dominant right vertebral artery. Given history of right neck pain, consider cervical vertebral dissection. 3. High-grade left superior cerebellar artery stenosis. Electronically Signed   By: Monte Fantasia M.D.   On: 12/26/2014 17:35   Mr Brain Wo Contrast  12/26/2014  CLINICAL DATA:  Vision loss on the left.  Right neck pain EXAM: MRI HEAD WITHOUT CONTRAST MRA HEAD WITHOUT CONTRAST TECHNIQUE: Multiplanar, multiecho pulse sequences of the brain and surrounding structures were obtained without intravenous contrast. Angiographic images of the head were obtained using MRA technique without contrast. COMPARISON:  Brain MRI 07/02/2003 FINDINGS: MRI HEAD FINDINGS Calvarium and upper cervical spine: No focal marrow signal abnormality. Orbits: No significant findings. Sinuses and Mastoids: Clear. Brain: There is a sub cm  focus of restricted diffusion within the low right occipital cortex consistent with acute infarct. No hemorrhagic conversion. Loss of flow related hypo intensity in the right vertebral artery, as described below. There is mild for age small-vessel ischemic change in the bilateral cerebral white matter, thinly confluent around around the frontal horns of the lateral ventricles. No previous cortical infarct detected. Normal cerebral volume for age. No mass lesion, hydrocephalus, or shift. MRA HEAD FINDINGS There is no flow related signal loss throughout the majority of the non dominant right vertebral artery, age indeterminate but presumably recent given the infarct. Fortunately, there is a large right AICA which is patent. No mural T1 hyperintensity on conventional imaging to suggest dissection. Symmetric left PICA and AICA which are widely patent. Symmetric superior cerebellar arteries with a high-grade proximal  stenosis on the left. The posterior cerebral arteries are patent. There is no notable stenosis within the anterior circulation. No major vessel occlusion. Negative for aneurysm. An anterior communicating artery is present. No posterior communicating arteries. These results were called by telephone at the time of interpretation on 12/26/2014 at 5:38 pm to Dr. Zenia Resides , who verbally acknowledged these results. IMPRESSION: 1. 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 2. Occluded non-dominant right vertebral artery. Given history of right neck pain, consider cervical vertebral dissection. 3. High-grade left superior cerebellar artery stenosis. Electronically Signed   By: Monte Fantasia M.D.   On: 12/26/2014 17:35    EKG:  Sinus rhythm, poor R wave progression; no acute ischemic changes   Assessment/Plan 1-ischemic CVA (cerebral infarction): affecting occipital area. Patient out of therapeutic window (symptoms started on 12/23/14) -will admit to telemetry  -will check carotid duplex and 2-D  echo -will use ASA for secondary prevention -neurology consulted and will follow any further recommendations -CT scan and MRI of the brain has already been done (demonstrating on MRI small occipital ischemic stroke and right vertebral artery obstruction) -will check TSH, lipid panel and A1C  2-Anxiety: stable and well controlled. -will continue home dose of valium  3-Hypothyroidism (acquired): patient with prior Graves disease (S/P treatment) now with subsequent hypothyroidism -will continue home regimen of Armour   4-GERD without esophagitis: continue protonix  5-Glaucoma: will continue xalatan and trusopt  6-Allergic rhinitis: stable -will continue flonase   Dr. Nicole Kindred (Neurohospitalist)  Code Status: Full code DVT Prophylaxis:Heprain  Family Communication: daughter at bedside  Disposition Plan: telemetry, [facility] (for neurology evaluation and further tx decisions),observation   Time spent: 67 minutes  Barton Dubois [facility] Pager [phone]

## 2014-12-26 NOTE — Consult Note (Addendum)
Referring Physician: [place]    Chief Complaint: Headache, change in vision  HPI: [name] is an 69 y.o. female who reports that she went to bed normal on Wednesday night.  When she awakened on Thursday she noted right sided neck pain and pain behind her right eye.  She then noted that her peripheral vision was restricted to the left.  She thought this was secondary to her sinuses but with no improvement in her symptoms over the past few days she presented for evaluation.  She reports that her symptoms are somewhat positional and worse when she is sitting up.  She currently has no headache.  Her symptoms have not restricted her activities and she has continued to drive and work.  NIHSS of 0.    Date last known well: Date: 12/22/2014 Time last known well: Time: 22:00 tPA Given: No: Outside time window  MRankin: 0  Past Medical History  Diagnosis Date  . Increased pressure in the eye   . Grave's disease   . Murmur   . GERD (gastroesophageal reflux disease)   . Morton's neuroma   . Hypothyroidism   . Anxiety   . Insomnia   . Diverticulosis   . Scoliosis   . Diverticulitis   . Barrett esophagus 2003  . Glaucoma   . Hepatitis A   . H. pylori infection     Past Surgical History  Procedure Laterality Date  . Tonsillectomy    . Laparoscopic assisted vaginal hysterectomy    . Laparoscopic sigmoid colectomy  05/13/06    Dr Zella Richer  . Femur surgery Right     benign bone growth  . Lumbar disc surgery  2009  . Appendectomy  1958  . Eye surgery      due to graves disease  . Tubal ligation    . Cervical laminectomy    . Foot neuroma surgery Right     Family History  Problem Relation Age of Onset  . Other Father 89    SEPSIS  . Hypertension Father   . Heart failure Mother 85  . Heart murmur Mother   . Diabetes Brother   . Heart attack Sister 65  . Breast cancer Sister   . Cirrhosis Brother   . Hypertension Sister     x 2  . Diabetes Sister   . Heart murmur Daughter    . Stroke Father 59    CVA   Social History:  reports that she quit smoking about 36 years ago. Her smoking use included Cigarettes. She has never used smokeless tobacco. She reports that she drinks alcohol. She reports that she does not use illicit drugs.  Allergies:  Allergies  Allergen Reactions  . Neuromuscular Blocking Agents Other (See Comments)    Multiple symptoms, SOB, fatigue, weakness    Medications:  I have reviewed the patient's current medications. Prior to Admission:  Prescriptions prior to admission  Medication Sig Dispense Refill Last Dose  . ARMOUR THYROID 15 MG tablet Take 1 tablet by mouth 2 (two) times a week. Saturday and Sunday   12/26/2014 at Unknown time  . ARMOUR THYROID 60 MG tablet Take 1 tablet by mouth daily.    12/26/2014 at Unknown time  . diazepam (VALIUM) 5 MG tablet Take 5 mg by mouth at bedtime as needed for anxiety (sleep).    12/25/2014 at Unknown time  . dorzolamide (TRUSOPT) 2 % ophthalmic solution Place 1 drop into both eyes daily.   12/26/2014 at Unknown time  .  fluticasone (FLONASE) 50 MCG/ACT nasal spray Place 2 sprays into both nostrils 2 (two) times daily as needed for allergies.    Past Week at Unknown time  . HYDROcodone-acetaminophen (NORCO) 7.5-325 MG per tablet Take 0.5-1 tablets by mouth every 6 (six) hours as needed for moderate pain or severe pain.   0 Past Week at Unknown time  . ibuprofen (ADVIL,MOTRIN) 200 MG tablet Take 400 mg by mouth every 6 (six) hours as needed for mild pain.    12/26/2014 at Unknown time  . latanoprost (XALATAN) 0.005 % ophthalmic solution Place 1 drop into both eyes at bedtime.   12/25/2014 at Unknown time  . Multiple Vitamin (MULTIVITAMIN) tablet Take 1 tablet by mouth daily.   Past Week at Unknown time   Scheduled: . aspirin  300 mg Rectal Daily   Or  . aspirin  325 mg Oral Daily  . [START ON 12/27/2014] dorzolamide  1 drop Both Eyes Daily  . heparin  5,000 Units Subcutaneous 3 times per day  .  latanoprost  1 drop Both Eyes QHS  . [START ON 12/27/2014] pantoprazole  40 mg Oral Q1200  . [START ON 01/01/2015] thyroid  15 mg Oral Once per day on Sun Sat  . [START ON 12/27/2014] thyroid  60 mg Oral Daily    ROS: History obtained from the patient  General ROS: negative for - chills, fatigue, fever, night sweats, weight gain or weight loss Psychological ROS: negative for - behavioral disorder, hallucinations, memory difficulties, mood swings or suicidal ideation Ophthalmic ROS: negative for - blurry vision, double vision, eye pain or loss of vision ENT ROS: negative for - epistaxis, nasal discharge, oral lesions, sore throat, tinnitus or vertigo Allergy and Immunology ROS: negative for - hives or itchy/watery eyes Hematological and Lymphatic ROS: negative for - bleeding problems, bruising or swollen lymph nodes Endocrine ROS: negative for - galactorrhea, hair pattern changes, polydipsia/polyuria or temperature intolerance Respiratory ROS: negative for - cough, hemoptysis, shortness of breath or wheezing Cardiovascular ROS: negative for - chest pain, dyspnea on exertion, edema or irregular heartbeat Gastrointestinal ROS: mild abdominal pain Genito-Urinary ROS: negative for - dysuria, hematuria, incontinence or urinary frequency/urgency Musculoskeletal ROS: negative for - joint swelling or muscular weakness Neurological ROS: as noted in HPI Dermatological ROS: negative for rash and skin lesion changes  Physical Examination: Blood pressure 156/81, pulse 74, temperature 97.9 F (36.6 C), temperature source Oral, resp. rate 16, height $RemoveBe'5\' 2"'[id number]$  (1.575 m), weight 54.7 kg (120 lb 9.5 oz), SpO2 97 %.  HEENT-  Normocephalic, no lesions, without obvious abnormality.  Normal external eye and conjunctiva.  Normal TM's bilaterally.  Normal auditory canals and external ears. Normal external nose, mucus membranes and septum.  Normal pharynx. Cardiovascular- S1, S2 normal, pulses palpable throughout    Lungs- chest clear, no wheezing, rales, normal symmetric air entry Abdomen- soft, non-tender; bowel sounds normal; no masses,  no organomegaly Extremities- no edema Lymph-no adenopathy palpable Musculoskeletal-no joint tenderness, deformity or swelling Skin-warm and dry, no hyperpigmentation, vitiligo, or suspicious lesions  Neurological Examination Mental Status: Alert, oriented, thought content appropriate.  Speech fluent without evidence of aphasia.  Able to follow 3 step commands without difficulty. Cranial Nerves: II: Discs flat bilaterally; Visual fields grossly normal, pupils equal, round, reactive to light and accommodation III,IV, VI: ptosis not present, extra-ocular motions intact bilaterally V,VII: smile symmetric, facial light touch sensation normal bilaterally VIII: hearing normal bilaterally IX,X: gag reflex present XI: bilateral shoulder shrug XII: midline tongue extension Motor: Right :  Upper extremity   5/5    Left:     Upper extremity   5/5  Lower extremity   5/5     Lower extremity   5/5 Tone and bulk:normal tone throughout; no atrophy noted Sensory: Pinprick and light touch intact throughout, bilaterally Deep Tendon Reflexes: 2+ and symmetric throughout Plantars: Right: downgoing   Left: downgoing Cerebellar: normal finger-to-nose and normal heel-to-shin testing bilaterally Gait: not tested due to pain    Laboratory Studies:  Basic Metabolic Panel:  Recent Labs Lab 12/26/14 1636 12/26/14 2037  NA 137  --   K 4.8  --   CL 104  --   CO2 26  --   GLUCOSE 100*  --   BUN 17  --   CREATININE 0.73 0.65  CALCIUM 9.7  --     Liver Function Tests: No results for input(s): AST, ALT, ALKPHOS, BILITOT, PROT, ALBUMIN in the last 168 hours. No results for input(s): LIPASE, AMYLASE in the last 168 hours. No results for input(s): AMMONIA in the last 168 hours.  CBC:  Recent Labs Lab 12/26/14 1636 12/26/14 2037  WBC 6.6 5.7  NEUTROABS 2.8  --   HGB  15.0 15.2*  HCT 45.6 46.4*  MCV 90.8 91.0  PLT 267 273    Cardiac Enzymes: No results for input(s): CKTOTAL, CKMB, CKMBINDEX, TROPONINI in the last 168 hours.  BNP: Invalid input(s): POCBNP  CBG:  Recent Labs Lab 12/26/14 2036  GLUCAP 49    Microbiology: Results for orders placed or performed in visit on 95/07/22  Helicobacter pylori special antigen     Status: None   Collection Time: 05/17/14 10:49 AM  Result Value Ref Range Status   H. PYLORI Antigen  POSITIVE  Final    Coagulation Studies:  Recent Labs  12/26/14 1636  LABPROT 12.9  INR 0.95    Urinalysis: No results for input(s): COLORURINE, LABSPEC, PHURINE, GLUCOSEU, HGBUR, BILIRUBINUR, KETONESUR, PROTEINUR, UROBILINOGEN, NITRITE, LEUKOCYTESUR in the last 168 hours.  Invalid input(s): APPERANCEUR  Lipid Panel:    Component Value Date/Time   CHOL  05/28/2010 0334    193        ATP III CLASSIFICATION:  <200     mg/dL   Desirable  200-239  mg/dL   Borderline High  >=240    mg/dL   High          TRIG 74 05/28/2010 0334   HDL 58 05/28/2010 0334   CHOLHDL 3.3 05/28/2010 0334   VLDL 15 05/28/2010 0334   LDLCALC * 05/28/2010 0334    120        Total Cholesterol/HDL:CHD Risk Coronary Heart Disease Risk Table                     Men   Women  1/2 Average Risk   3.4   3.3  Average Risk       5.0   4.4  2 X Average Risk   9.6   7.1  3 X Average Risk  23.4   11.0        Use the calculated Patient Ratio above and the CHD Risk Table to determine the patient's CHD Risk.        ATP III CLASSIFICATION (LDL):  <100     mg/dL   Optimal  100-129  mg/dL   Near or Above                    Optimal  130-159  mg/dL   Borderline  160-189  mg/dL   High  >190     mg/dL   Very High    HgbA1C: No results found for: HGBA1C  Urine Drug Screen:  No results found for: LABOPIA, COCAINSCRNUR, LABBENZ, AMPHETMU, THCU, LABBARB  Alcohol Level: No results for input(s): ETH in the last 168 hours.  Other results: EKG:  sinus rhythm at 65 bpm.  Imaging: Ct Head Wo Contrast  12/26/2014  CLINICAL DATA:  69 year old female with history of right-sided facial pain and right frontal headache since Thursday of this week. EXAM: CT HEAD WITHOUT CONTRAST TECHNIQUE: Contiguous axial images were obtained from the base of the skull through the vertex without intravenous contrast. COMPARISON:  No priors. FINDINGS: Patchy and confluent areas of decreased attenuation are noted throughout the deep and periventricular white matter of the cerebral hemispheres bilaterally, compatible with chronic microvascular ischemic disease. No acute intracranial abnormalities. Specifically, no evidence of acute intracranial hemorrhage, no definite findings of acute/subacute cerebral ischemia, no mass, mass effect, hydrocephalus or abnormal intra or extra-axial fluid collections. Visualized paranasal sinuses and mastoids are well pneumatized. No acute displaced skull fractures are identified. IMPRESSION: 1. No acute intracranial abnormalities. 2. Mild chronic microvascular ischemic changes in cerebral white matter, as above. Electronically Signed   By: Vinnie Langton M.D.   On: 12/26/2014 13:10   Mr Jodene Nam Head Wo Contrast  12/26/2014  CLINICAL DATA:  Vision loss on the left.  Right neck pain EXAM: MRI HEAD WITHOUT CONTRAST MRA HEAD WITHOUT CONTRAST TECHNIQUE: Multiplanar, multiecho pulse sequences of the brain and surrounding structures were obtained without intravenous contrast. Angiographic images of the head were obtained using MRA technique without contrast. COMPARISON:  Brain MRI 07/02/2003 FINDINGS: MRI HEAD FINDINGS Calvarium and upper cervical spine: No focal marrow signal abnormality. Orbits: No significant findings. Sinuses and Mastoids: Clear. Brain: There is a sub cm focus of restricted diffusion within the low right occipital cortex consistent with acute infarct. No hemorrhagic conversion. Loss of flow related hypo intensity in the right  vertebral artery, as described below. There is mild for age small-vessel ischemic change in the bilateral cerebral white matter, thinly confluent around around the frontal horns of the lateral ventricles. No previous cortical infarct detected. Normal cerebral volume for age. No mass lesion, hydrocephalus, or shift. MRA HEAD FINDINGS There is no flow related signal loss throughout the majority of the non dominant right vertebral artery, age indeterminate but presumably recent given the infarct. Fortunately, there is a large right AICA which is patent. No mural T1 hyperintensity on conventional imaging to suggest dissection. Symmetric left PICA and AICA which are widely patent. Symmetric superior cerebellar arteries with a high-grade proximal stenosis on the left. The posterior cerebral arteries are patent. There is no notable stenosis within the anterior circulation. No major vessel occlusion. Negative for aneurysm. An anterior communicating artery is present. No posterior communicating arteries. These results were called by telephone at the time of interpretation on 12/26/2014 at 5:38 pm to Dr. Zenia Resides , who verbally acknowledged these results. IMPRESSION: 1. 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 2. Occluded non-dominant right vertebral artery. Given history of right neck pain, consider cervical vertebral dissection. 3. High-grade left superior cerebellar artery stenosis. Electronically Signed   By: Monte Fantasia M.D.   On: 12/26/2014 17:35   Mr Brain Wo Contrast  12/26/2014  CLINICAL DATA:  Vision loss on the left.  Right neck pain EXAM: MRI HEAD WITHOUT CONTRAST MRA HEAD WITHOUT CONTRAST TECHNIQUE:  Multiplanar, multiecho pulse sequences of the brain and surrounding structures were obtained without intravenous contrast. Angiographic images of the head were obtained using MRA technique without contrast. COMPARISON:  Brain MRI 07/02/2003 FINDINGS: MRI HEAD FINDINGS Calvarium and upper cervical  spine: No focal marrow signal abnormality. Orbits: No significant findings. Sinuses and Mastoids: Clear. Brain: There is a sub cm focus of restricted diffusion within the low right occipital cortex consistent with acute infarct. No hemorrhagic conversion. Loss of flow related hypo intensity in the right vertebral artery, as described below. There is mild for age small-vessel ischemic change in the bilateral cerebral white matter, thinly confluent around around the frontal horns of the lateral ventricles. No previous cortical infarct detected. Normal cerebral volume for age. No mass lesion, hydrocephalus, or shift. MRA HEAD FINDINGS There is no flow related signal loss throughout the majority of the non dominant right vertebral artery, age indeterminate but presumably recent given the infarct. Fortunately, there is a large right AICA which is patent. No mural T1 hyperintensity on conventional imaging to suggest dissection. Symmetric left PICA and AICA which are widely patent. Symmetric superior cerebellar arteries with a high-grade proximal stenosis on the left. The posterior cerebral arteries are patent. There is no notable stenosis within the anterior circulation. No major vessel occlusion. Negative for aneurysm. An anterior communicating artery is present. No posterior communicating arteries. These results were called by telephone at the time of interpretation on 12/26/2014 at 5:38 pm to Dr. Zenia Resides , who verbally acknowledged these results. IMPRESSION: 1. 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 2. Occluded non-dominant right vertebral artery. Given history of right neck pain, consider cervical vertebral dissection. 3. High-grade left superior cerebellar artery stenosis. Electronically Signed   By: Monte Fantasia M.D.   On: 12/26/2014 17:35    Assessment: 69 y.o. female presenting with headache, neck pain and restricted left peripheral vision.  Head CT reviewed and unremarkable.  MRI of the  brain personally reviewed and shows a small right occipital acute infarct.  The right vertebral artery appears occluded as well.  Question was raised for dissection.  Patient on no antiplatelet therapy at home.  Outside time window for tPA.  Further work up recommended.    Stroke Risk Factors - none  Plan: 1. HgbA1c, fasting lipid panel, ESR, CRP 2. CTA  of the head and neck 3. PT consult, OT consult, Speech consult 4. Echocardiogram 5. Prophylactic therapy-Antiplatelet med: Aspirin - dose $Remo'325mg'ewHBb$  daily 6. NPO until RN stroke swallow screen 7. Telemetry monitoring 8. Frequent neuro checks  Family questions addressed for 45 minutes that was in addition to time required for history, physical and treatment plan development.    Alexis Goodell, MD [facility] [phone] 12/26/2014, 10:59 PM

## 2014-12-26 NOTE — ED Notes (Signed)
Patient resting quietly in room with husband at bedside. Patient is A&Ox4. Patient denies any needs at this time.

## 2014-12-26 NOTE — ED Provider Notes (Signed)
CSN: [id number]     Arrival date & time 12/26/14  1133 History   First MD Initiated Contact with Patient 12/26/14 1155     Chief Complaint  Patient presents with  . Facial Pain     (Consider location/radiation/quality/duration/timing/severity/associated sxs/prior Treatment) HPI 69 year old female with history of hypothyroidism and ocular migraines who presents with visual field loss and headache. She reports that for the past 3-4 days she has developed right-sided frontal headache that is associated with intermittent visual field loss. She told her family about this yesterday, which brought them into the hospital today for evaluation. Patient is unsure if her visual field cut, which is reported on the left side, is out of the left eye or both eyes. Currently does not have any visual symptoms but does report right-sided frontal headache that is 4 out of 10 in nature. Pain is not of sudden onset maximal intensity. Seems to be worse with movement of her head as well as sitting forward. Reports some right sided posterior neck discomfort, not fully worsened by movement.  She  reports that with her ocular migraines she has scintillating visual defects, that is followed by mild generalized headache, and this headache is very different from that.  Denies nausea, vomiting, photophobia, phonophobia, neck stiffness, numbness or weakness, vision loss, double vision, speech changes, difficulty swallowing, or gait instability. Has not had any recent illnesses and denies any fevers, chills, cough.   Past Medical History  Diagnosis Date  . Increased pressure in the eye   . Grave's disease   . Murmur   . GERD (gastroesophageal reflux disease)   . Morton's neuroma   . Hypothyroidism   . Anxiety   . Insomnia   . Diverticulosis   . Scoliosis   . Diverticulitis   . Barrett esophagus 2003  . Glaucoma   . Hepatitis A   . H. pylori infection    Past Surgical History  Procedure Laterality Date  .  Tonsillectomy    . Laparoscopic assisted vaginal hysterectomy    . Laparoscopic sigmoid colectomy  05/13/06    Dr Zella Richer  . Femur surgery Right     benign bone growth  . Lumbar disc surgery  2009  . Appendectomy  1958  . Eye surgery      due to graves disease  . Tubal ligation    . Cervical laminectomy    . Foot neuroma surgery Right    Family History  Problem Relation Age of Onset  . Other Father 80    SEPSIS  . Hypertension Father   . Heart failure Mother 28  . Heart murmur Mother   . Diabetes Brother   . Heart attack Sister 52  . Breast cancer Sister   . Cirrhosis Brother   . Hypertension Sister     x 2  . Diabetes Sister   . Heart murmur Daughter   . Stroke Father 6    CVA   Social History  Substance Use Topics  . Smoking status: Former Smoker    Types: Cigarettes    Quit date: 03/12/1978  . Smokeless tobacco: Never Used     Comment: quit in 1978  . Alcohol Use: 0.0 oz/week    0 Standard drinks or equivalent per week     Comment: SOCIALLY   OB History    No data available     Review of Systems 10/14 systems reviewed and are negative other than those stated in the HPI    Allergies  Review of patient's allergies indicates no known allergies.  Home Medications   Prior to Admission medications   Medication Sig Start Date End Date Taking? Authorizing Provider  ARMOUR THYROID 15 MG tablet Take 1 tablet by mouth 2 (two) times a week. Take in combination with 60 mg on Saturday and Sunday to equal 75 mg 01/06/14   Historical Provider, MD  ARMOUR THYROID 60 MG tablet Take 1 tablet by mouth daily. Take 60 mg by mouth Monday - Friday (alone). Take 60 mg + 15 mg on Saturday and Sunday. 01/26/14   Historical Provider, MD  bismuth subsalicylate (PEPTO-BISMOL) 262 MG chewable tablet Take 2 tablets po  QID x 14 days Patient not taking: Reported on 10/07/2014 05/21/14   Lafayette Dragon, MD  dicyclomine (BENTYL) 20 MG tablet Take 1 tablet (20 mg total) by mouth 2 (two)  times daily. 10/07/14   Ezequiel Essex, MD  dorzolamide (TRUSOPT) 2 % ophthalmic solution Place 1 drop into both eyes daily.    Historical Provider, MD  glycopyrrolate (ROBINUL) 1 MG tablet Take 1 tablet (1 mg total) by mouth 3 (three) times daily between meals as needed. Patient not taking: Reported on 10/07/2014 03/24/14   Lafayette Dragon, MD  HYDROcodone-acetaminophen ([place]) 7.5-325 MG per tablet Take 0.5-1 tablets by mouth every 6 (six) hours as needed for moderate pain or severe pain.  07/16/14   Historical Provider, MD  latanoprost (XALATAN) 0.005 % ophthalmic solution Place 1 drop into both eyes at bedtime.    Historical Provider, MD  metroNIDAZOLE (FLAGYL) 250 MG tablet Take one po QID x 14 days Patient not taking: Reported on 10/07/2014 05/21/14   Lafayette Dragon, MD  Multiple Vitamin (MULTIVITAMIN) tablet Take 1 tablet by mouth daily.    Historical Provider, MD  omeprazole (PRILOSEC) 40 MG capsule Take one po BID x 14 days Patient not taking: Reported on 10/07/2014 05/21/14   Lafayette Dragon, MD  ondansetron (ZOFRAN) 4 MG tablet Take 1 tablet (4 mg total) by mouth every 6 (six) hours. 10/07/14   Ezequiel Essex, MD   BP 133/70 mmHg  Pulse 61  Temp(Src) 98.3 F (36.8 C) (Oral)  Resp 18  Ht 5\' 2"  (1.575 m)  Wt 122 lb (55.339 kg)  BMI 22.31 kg/m2  SpO2 98% Physical Exam Physical Exam  Nursing note and vitals reviewed. Constitutional: Well developed, well nourished, non-toxic, and in no acute distress Head: Normocephalic and atraumatic. No pain over temporal arteries. Mouth/Throat: Oropharynx is clear and moist.  Neck: Normal range of motion. Neck supple. No tenderness to palpation. Cardiovascular: Normal rate and regular rhythm.   Pulmonary/Chest: Effort normal and breath sounds normal.  Abdominal: Soft. There is no tenderness. There is no rebound and no guarding.  Musculoskeletal: Normal range of motion.  Skin: Skin is warm and dry.  Psychiatric: Cooperative Neurological:  Alert,  oriented to person, place, time, and situation. Memory grossly in tact. Fluent speech. No dysarthria or aphasia.  Cranial nerves: VF are full. Fundoscopic exam without papilledema. Pupils are symmetric, and reactive to light. EOMI without nystagmus. No gaze deviation. Facial muscles symmetric with activation. Sensation to light touch over face in tact bilaterally. Hearing grossly in tact. Palate elevates symmetrically. Head turn and shoulder shrug are intact. Tongue midline. \  Muscle bulk and tone normal. No pronator drift. Moves all extremities symmetrically. Sensation to light touch is in tact throughout in bilateral upper and lower extremities. Coordination reveals no dysmetria with finger to nose. Gait is narrow-based and  steady. Non-ataxic.   ED Course  Procedures (including critical care time) Labs Review Labs Reviewed - No data to display  Imaging Review Ct Head Wo Contrast  12/26/2014  CLINICAL DATA:  69 year old female with history of right-sided facial pain and right frontal headache since Thursday of this week. EXAM: CT HEAD WITHOUT CONTRAST TECHNIQUE: Contiguous axial images were obtained from the base of the skull through the vertex without intravenous contrast. COMPARISON:  No priors. FINDINGS: Patchy and confluent areas of decreased attenuation are noted throughout the deep and periventricular white matter of the cerebral hemispheres bilaterally, compatible with chronic microvascular ischemic disease. No acute intracranial abnormalities. Specifically, no evidence of acute intracranial hemorrhage, no definite findings of acute/subacute cerebral ischemia, no mass, mass effect, hydrocephalus or abnormal intra or extra-axial fluid collections. Visualized paranasal sinuses and mastoids are well pneumatized. No acute displaced skull fractures are identified. IMPRESSION: 1. No acute intracranial abnormalities. 2. Mild chronic microvascular ischemic changes in cerebral white matter, as above.  Electronically Signed   By: Vinnie Langton M.D.   On: 12/26/2014 13:10   I have personally reviewed and evaluated these images and lab results as part of my medical decision-making.   MDM   Final diagnoses:  Visual field loss    in short, this is a 69 year old female with history of ocular migraines and hypothyroidism who presents with right-sided frontal headache with intermittent left visual field cut. Unclear if this is unilateral left eye visual field cut  or homonymous hemianopsia. She is neurologically intact, and without vision changes currently. Did attempt funduscopic exam, without appreciable papilledema. Bedside ocular ultrasound also did not reveal evidence of vitreal or retinal detachment. Bilateral intraocular pressures were between 20 and 22, not otherwise unremarkable. Not true vision loss out of one eye, and this seem less likely to be retinal artery or vein occlusion. No pain over temporal artery and vision changes seem opposite of that of headache and not c/f temporal arteritis. CT head is negative for any acute intracranial processes. I discussed with family is unclear if she truly has homonymous hemianopsia, which should be more concerning for intracranial process and would require MRI. Will require imaging of her vasculature as well given vague neck discomfort to rule out dissection. Will go to [facility] for MRI.    Forde Dandy, MD 12/26/14 [phone]

## 2014-12-27 ENCOUNTER — Encounter: Payer: Self-pay | Admitting: *Deleted

## 2014-12-27 ENCOUNTER — Other Ambulatory Visit (HOSPITAL_COMMUNITY): Payer: Medicare Other

## 2014-12-27 ENCOUNTER — Observation Stay (HOSPITAL_COMMUNITY): Payer: Medicare Other

## 2014-12-27 ENCOUNTER — Encounter (HOSPITAL_COMMUNITY): Payer: Self-pay | Admitting: *Deleted

## 2014-12-27 DIAGNOSIS — I63211 Cerebral infarction due to unspecified occlusion or stenosis of right vertebral arteries: Secondary | ICD-10-CM | POA: Diagnosis present

## 2014-12-27 DIAGNOSIS — I6789 Other cerebrovascular disease: Secondary | ICD-10-CM | POA: Diagnosis not present

## 2014-12-27 DIAGNOSIS — H409 Unspecified glaucoma: Secondary | ICD-10-CM | POA: Diagnosis present

## 2014-12-27 DIAGNOSIS — Z006 Encounter for examination for normal comparison and control in clinical research program: Secondary | ICD-10-CM

## 2014-12-27 DIAGNOSIS — I63111 Cerebral infarction due to embolism of right vertebral artery: Secondary | ICD-10-CM

## 2014-12-27 DIAGNOSIS — Z823 Family history of stroke: Secondary | ICD-10-CM | POA: Diagnosis not present

## 2014-12-27 DIAGNOSIS — Z803 Family history of malignant neoplasm of breast: Secondary | ICD-10-CM | POA: Diagnosis not present

## 2014-12-27 DIAGNOSIS — R51 Headache: Secondary | ICD-10-CM | POA: Diagnosis present

## 2014-12-27 DIAGNOSIS — Z833 Family history of diabetes mellitus: Secondary | ICD-10-CM | POA: Diagnosis not present

## 2014-12-27 DIAGNOSIS — Z8249 Family history of ischemic heart disease and other diseases of the circulatory system: Secondary | ICD-10-CM | POA: Diagnosis not present

## 2014-12-27 DIAGNOSIS — E785 Hyperlipidemia, unspecified: Secondary | ICD-10-CM | POA: Diagnosis present

## 2014-12-27 DIAGNOSIS — J309 Allergic rhinitis, unspecified: Secondary | ICD-10-CM | POA: Diagnosis present

## 2014-12-27 DIAGNOSIS — F419 Anxiety disorder, unspecified: Secondary | ICD-10-CM | POA: Diagnosis present

## 2014-12-27 DIAGNOSIS — K219 Gastro-esophageal reflux disease without esophagitis: Secondary | ICD-10-CM | POA: Diagnosis present

## 2014-12-27 DIAGNOSIS — I6502 Occlusion and stenosis of left vertebral artery: Secondary | ICD-10-CM

## 2014-12-27 DIAGNOSIS — Z87891 Personal history of nicotine dependence: Secondary | ICD-10-CM | POA: Diagnosis not present

## 2014-12-27 DIAGNOSIS — I63011 Cerebral infarction due to thrombosis of right vertebral artery: Secondary | ICD-10-CM | POA: Diagnosis not present

## 2014-12-27 DIAGNOSIS — E039 Hypothyroidism, unspecified: Secondary | ICD-10-CM | POA: Diagnosis present

## 2014-12-27 LAB — COMPREHENSIVE METABOLIC PANEL
ALK PHOS: 91 U/L (ref 38–126)
ALT: 19 U/L (ref 14–54)
AST: 24 U/L (ref 15–41)
Albumin: 4.2 g/dL (ref 3.5–5.0)
Anion gap: 10 (ref 5–15)
BUN: 15 mg/dL (ref 6–20)
CALCIUM: 9.7 mg/dL (ref 8.9–10.3)
CO2: 28 mmol/L (ref 22–32)
Chloride: 100 mmol/L — ABNORMAL LOW (ref 101–111)
Creatinine, Ser: 0.87 mg/dL (ref 0.44–1.00)
GFR calc non Af Amer: >60 mL/min (ref >60)
GLUCOSE: 106 mg/dL — AB (ref 65–99)
Potassium: 4.1 mmol/L (ref 3.5–5.1)
SODIUM: 138 mmol/L (ref 135–145)
Total Bilirubin: 0.5 mg/dL (ref 0.3–1.2)
Total Protein: 7.1 g/dL (ref 6.5–8.1)

## 2014-12-27 LAB — C-REACTIVE PROTEIN: CRP: 1 mg/dL — AB (ref <1.0)

## 2014-12-27 LAB — CBC
HCT: 48.2 % — ABNORMAL HIGH (ref 36.0–46.0)
HEMOGLOBIN: 15.6 g/dL — AB (ref 12.0–15.0)
MCH: 29.5 pg (ref 26.0–34.0)
MCHC: 32.4 g/dL (ref 30.0–36.0)
MCV: 91.1 fL (ref 78.0–100.0)
Platelets: 256 10*3/uL (ref 150–400)
RBC: 5.29 MIL/uL — AB (ref 3.87–5.11)
RDW: 12.6 % (ref 11.5–15.5)
WBC: 5.4 10*3/uL (ref 4.0–10.5)

## 2014-12-27 LAB — LIPID PANEL
Cholesterol: 237 mg/dL — ABNORMAL HIGH (ref 0–200)
HDL: 61 mg/dL (ref >40)
LDL CALC: 138 mg/dL — AB (ref 0–99)
TRIGLYCERIDES: 190 mg/dL — AB (ref <150)
Total CHOL/HDL Ratio: 3.9 RATIO
VLDL: 38 mg/dL (ref 0–40)

## 2014-12-27 LAB — SEDIMENTATION RATE: SED RATE: 1 mm/h (ref 0–22)

## 2014-12-27 MED ORDER — CLOPIDOGREL BISULFATE 75 MG PO TABS
75.0000 mg | ORAL_TABLET | Freq: Every day | ORAL | Status: DC
Start: 2014-12-27 — End: 2014-12-28
  Administered 2014-12-27 – 2014-12-28 (×2): 75 mg via ORAL
  Filled 2014-12-27 (×2): qty 1

## 2014-12-27 MED ORDER — IOHEXOL 350 MG/ML SOLN
50.0000 mL | Freq: Once | INTRAVENOUS | Status: AC | PRN
  Administered 2014-12-27: 50 mL via INTRAVENOUS

## 2014-12-27 MED ORDER — ATORVASTATIN CALCIUM 40 MG PO TABS
40.0000 mg | ORAL_TABLET | Freq: Every day | ORAL | Status: DC
  Administered 2014-12-27: 40 mg via ORAL
  Filled 2014-12-27: qty 1

## 2014-12-27 MED ORDER — ASPIRIN EC 81 MG PO TBEC
81.0000 mg | DELAYED_RELEASE_TABLET | Freq: Every day | ORAL | Status: DC
  Administered 2014-12-28: 81 mg via ORAL
  Filled 2014-12-27: qty 1

## 2014-12-27 NOTE — Progress Notes (Signed)
PT Cancellation Note  Patient Details Name: [name] MRN: [id number] DOB: Oct 14, 1945   Cancelled Treatment:    Reason Eval/Treat Not Completed: Patient at procedure or test/unavailable. Off unit   [name] 12/27/2014, 9:01 AM Pager [phone]

## 2014-12-27 NOTE — Evaluation (Signed)
Physical Therapy Evaluation and Discharge Patient Details Name: [name] MRN: [id number] DOB: [birth year] Today's Date: 12/27/2014   History of Present Illness  presented to ED with Rt frontal headache and Rt posterior neck pain, Lt field cut vision; MRI right occipital cortex infarction and Rt vertebral artery occllusion (?dissection)  PMHx-glaucoma, Graves, scoliosis, back surgery, Rt femur surgery  Clinical Impression  Patient evaluated by Physical Therapy with no further acute PT needs identified. All education has been completed and the patient has no further questions. PT is signing off. Thank you for this referral.     Follow Up Recommendations No PT follow up    Equipment Recommendations  None recommended by PT    Recommendations for Other Services OT consult (for vision)     Precautions / Restrictions Precautions Precautions: None      Mobility  Bed Mobility Overal bed mobility: Independent                Transfers Overall transfer level: Independent Equipment used: None                Ambulation/Gait Ambulation/Gait assistance: Independent Ambulation Distance (Feet): 400 Feet Assistive device: None Gait Pattern/deviations: WFL(Within Functional Limits)   Gait velocity interpretation: at or above normal speed for age/gender General Gait Details: no difficulties with Lt vision deficits  Stairs Stairs: Yes Stairs assistance: Independent Stair Management: No rails;Alternating pattern;Forwards Number of Stairs: 5    Wheelchair Mobility    Modified Rankin (Stroke Patients Only) Modified Rankin (Stroke Patients Only) Pre-Morbid Rankin Score: No symptoms Modified Rankin: No significant disability     Balance Overall balance assessment: Independent                               Standardized Balance Assessment Standardized Balance Assessment : Dynamic Gait Index   Dynamic Gait Index Level Surface: Normal Change in  Gait Speed: Normal Gait with Horizontal Head Turns: Normal Gait with Vertical Head Turns: Normal Gait and Pivot Turn: Normal Step Over Obstacle: Normal Step Around Obstacles: Normal Steps: Normal Total Score: 24       Pertinent Vitals/Pain Pain Assessment: No/denies pain    Home Living Family/patient expects to be discharged to:: Private residence Living Arrangements: Spouse/significant other Available Help at Discharge: Family;Available 24 hours/day Type of Home: House Home Access: Stairs to enter Entrance Stairs-Rails: Right;Left;Can reach both Entrance Stairs-Number of Steps: 3 Home Layout: One level Home Equipment: Grab bars - tub/shower;Bedside commode;Walker - 2 wheels;Shower seat (husband's DME post TKR)      Prior Function Level of Independence: Independent         Comments: part-time radiology tech     Hand Dominance   Dominant Hand: Right    Extremity/Trunk Assessment   Upper Extremity Assessment: Overall WFL for tasks assessed;Defer to OT evaluation           Lower Extremity Assessment: Overall WFL for tasks assessed      Cervical / Trunk Assessment: Normal  Communication   Communication: No difficulties  Cognition Arousal/Alertness: Awake/alert Behavior During Therapy: WFL for tasks assessed/performed Overall Cognitive Status: Within Functional Limits for tasks assessed                      General Comments      Exercises        Assessment/Plan    PT Assessment Patent does not need any further PT services  PT Diagnosis  Difficulty walking   PT Problem List    PT Treatment Interventions     PT Goals (Current goals can be found in the Care Plan section) Acute Rehab PT Goals PT Goal Formulation: All assessment and education complete, DC therapy    Frequency     Barriers to discharge        Co-evaluation               End of Session Equipment Utilized During Treatment: Gait belt Activity Tolerance: Patient  tolerated treatment well Patient left: in chair;with call bell/phone within reach Nurse Communication: Mobility status;Other (comment) (Independent with gait; sign-off sheet started)    Functional Assessment Tool Used: clinical observation; DGI Functional Limitation: Mobility: Walking and moving around Mobility: Walking and Moving Around Current Status [phone]): 0 percent impaired, limited or restricted Mobility: Walking and Moving Around Goal Status [phone]): 0 percent impaired, limited or restricted Mobility: Walking and Moving Around Discharge Status [phone]): 0 percent impaired, limited or restricted    Time: 2683-4196 PT Time Calculation (min) (ACUTE ONLY): 18 min   Charges:   PT Evaluation $Initial PT Evaluation Tier I: 1 Procedure     PT G Codes:   PT G-Codes **NOT FOR INPATIENT CLASS** Functional Assessment Tool Used: clinical observation; DGI Functional Limitation: Mobility: Walking and moving around Mobility: Walking and Moving Around Current Status (Q2297): 0 percent impaired, limited or restricted Mobility: Walking and Moving Around Goal Status (L8921): 0 percent impaired, limited or restricted Mobility: Walking and Moving Around Discharge Status (J9417): 0 percent impaired, limited or restricted    Liyanna Cartwright 12/27/2014, 10:13 AM  Pager [phone]

## 2014-12-27 NOTE — Progress Notes (Signed)
STROKE-AF Research study presented to patient and family. Questions encouraged and answered. We will follow up in morning to possible consent pt.

## 2014-12-27 NOTE — Care Management Note (Signed)
Case Management Note  Patient Details  Name: [name] MRN: [id number] Date of Birth: 10/21/1945  Subjective/Objective:                    Action/Plan: Patient admitted with CVA. Patient is from home with her spouse. PT not recommending any follow up. CM will continue to follow for discharge needs.   Expected Discharge Date:  12/29/14               Expected Discharge Plan:  Home/Self Care  In-House Referral:     Discharge planning Services     Post Acute Care Choice:    Choice offered to:     DME Arranged:    DME Agency:     HH Arranged:    HH Agency:     Status of Service:  In process, will continue to follow  Medicare Important Message Given:    Date Medicare IM Given:    Medicare IM give by:    Date Additional Medicare IM Given:    Additional Medicare Important Message give by:     If discussed at [place] of Stay Meetings, dates discussed:    Additional Comments:  Pollie Friar, RN 12/27/2014, 11:19 AM

## 2014-12-27 NOTE — Progress Notes (Signed)
Occupational Therapy Evaluation Patient Details Name: [name] MRN: [id number] DOB: 1945-04-16 Today's Date: 12/27/2014    History of Present Illness presented to ED with Rt frontal headache and Rt posterior neck pain, Lt field cut vision; MRI right occipital cortex infarction and Rt vertebral artery occllusion (?dissection)  PMHx-glaucoma, Graves, scoliosis, back surgery, Rt femur surgery   Clinical Impression   Pt admitted with the above diagnoses. Pt presents at baseline with ADLs. Vision assessment yielded [facility] visual fields. Pt able to read items up close and at a distance. Pt did report some mild bluriness "but I don't think it's related to this." Pt completed community distance functional mobility while tossing a small ball back and forth between hands and serial counting by 2s with good accuracy and no LOB. No further acute OT needs indicated. OT signing off.      Follow Up Recommendations  No OT follow up    Equipment Recommendations  None recommended by OT    Recommendations for Other Services       Precautions / Restrictions Precautions Precautions: None      Mobility Bed Mobility Overal bed mobility: Independent                Transfers Overall transfer level: Independent Equipment used: None             General transfer comment: from recliner    Balance Overall balance assessment: Independent                                       ADL Overall ADL's : At baseline                                       General ADL Comments: Pt completed community distance functional mobility while tossing a small ball back and forth between hands and serial counting by 2s with good accuracy and no LOB. Pt stated that she has a h/o visual disturbaces "migraine eye" and when she feels this coming on she takes pain med and lies down.      Vision Vision Assessment?: Yes Eye Alignment: Within Functional Limits Ocular Range of  Motion: Within Functional Limits Alignment/Gaze Preference: Within Defined Limits Tracking/Visual Pursuits: Able to track stimulus in all quads without difficulty Saccades: Within functional limits Visual Fields: No apparent deficits Additional Comments: Pt working on newspaper puzzle upon therapist arrival. Able to read items up close and at a distance. Did report some mild bluriness "but I don't think it's related to this."   Perception     Praxis      Pertinent Vitals/Pain Pain Assessment: 0-10 Pain Score: 1  Pain Location: right, frontal headache Pain Descriptors / Indicators: Dull;Headache Pain Intervention(s): Monitored during session;Premedicated before session     Hand Dominance Right   Extremity/Trunk Assessment Upper Extremity Assessment Upper Extremity Assessment: Overall WFL for tasks assessed   Lower Extremity Assessment Lower Extremity Assessment: Defer to PT evaluation   Cervical / Trunk Assessment Cervical / Trunk Assessment: Normal   Communication Communication Communication: No difficulties   Cognition Arousal/Alertness: Awake/alert Behavior During Therapy: WFL for tasks assessed/performed Overall Cognitive Status: Within Functional Limits for tasks assessed                     General Comments  Exercises       Shoulder Instructions      Home Living Family/patient expects to be discharged to:: Private residence Living Arrangements: Spouse/significant other Available Help at Discharge: Family;Available 24 hours/day Type of Home: House Home Access: Stairs to enter [employer] of Steps: 3 Entrance Stairs-Rails: Right;Left;Can reach both Home Layout: One level     Bathroom Shower/Tub: Tub/shower unit;Door   [employer] Toilet: Standard Bathroom Accessibility: Yes   Home Equipment: Grab bars - tub/shower;Bedside commode;Walker - 2 wheels;Shower seat          Prior Functioning/Environment Level of Independence:  Independent        Comments: part-time radiology tech    OT Diagnosis: Disturbance of vision   OT Problem List:     OT Treatment/Interventions:      OT Goals(Current goals can be found in the care plan section)    OT Frequency:     Barriers to D/C:            Co-evaluation              End of Session    Activity Tolerance: Patient tolerated treatment well Patient left: in chair;with call bell/phone within reach   Time: 1116-1135 OT Time Calculation (min): 19 min Charges:  OT General Charges $OT Visit: 1 Procedure OT Evaluation $Initial OT Evaluation Tier I: 1 Procedure G-Codes: OT G-codes **NOT FOR INPATIENT CLASS** Functional Assessment Tool Used: clincial judgement Functional Limitation: Self care Self Care Current Status (J0964): 0 percent impaired, limited or restricted Self Care Goal Status (R8381): 0 percent impaired, limited or restricted Self Care Discharge Status (M4037): 0 percent impaired, limited or restricted  [name] 12/27/2014, 11:48 AM

## 2014-12-27 NOTE — Progress Notes (Signed)
STROKE TEAM PROGRESS NOTE   HISTORY [name] is an 69 y.o. female who reports that she went to bed normal on Wednesday night, 12/22/2014 at 2200 (LKW). When she awakened on Thursday she noted right sided neck pain and pain behind her right eye. She then noted that her peripheral vision was restricted to the left. She thought this was secondary to her sinuses but with no improvement in her symptoms over the past few days she presented for evaluation. She reports that her symptoms are somewhat positional and worse when she is sitting up. She currently has no headache. Her symptoms have not restricted her activities and she has continued to drive and work. NIHSS of 0. MRankin: 0. Patient was not administered TPA secondary to delay in arrival. She was admitted for further evaluation and treatment.   SUBJECTIVE (INTERVAL HISTORY) Her husband is at the bedside.  She is sitting up in the chair at the bedside. Overall she feels her condition is stable.    OBJECTIVE Temp:  [97.3 F (36.3 C)-98.4 F (36.9 C)] 97.9 F (36.6 C) (10/17 1020) Pulse Rate:  [61-94] 83 (10/17 1020) Cardiac Rhythm:  [-] Normal sinus rhythm (10/17 0700) Resp:  [16-20] 20 (10/17 1020) BP: (114-156)/(63-82) 120/70 mmHg (10/17 1020) SpO2:  [96 %-98 %] 96 % (10/17 1020) Weight:  [54.7 kg (120 lb 9.5 oz)-55.339 kg (122 lb)] 54.7 kg (120 lb 9.5 oz) (10/16 2016)  CBC:   Recent Labs Lab 12/26/14 1636 12/26/14 2037 12/27/14 0434  WBC 6.6 5.7 5.4  NEUTROABS 2.8  --   --   HGB 15.0 15.2* 15.6*  HCT 45.6 46.4* 48.2*  MCV 90.8 91.0 91.1  PLT 267 273 163    Basic Metabolic Panel:   Recent Labs Lab 12/26/14 1636 12/26/14 2037 12/27/14 0434  NA 137  --  138  K 4.8  --  4.1  CL 104  --  100*  CO2 26  --  28  GLUCOSE 100*  --  106*  BUN 17  --  15  CREATININE 0.73 0.65 0.87  CALCIUM 9.7  --  9.7    Lipid Panel:     Component Value Date/Time   CHOL 237* 12/27/2014 0434   TRIG 190* 12/27/2014 0434    HDL 61 12/27/2014 0434   CHOLHDL 3.9 12/27/2014 0434   VLDL 38 12/27/2014 0434   LDLCALC 138* 12/27/2014 0434   HgbA1c: No results found for: HGBA1C Urine Drug Screen: No results found for: LABOPIA, COCAINSCRNUR, LABBENZ, AMPHETMU, THCU, LABBARB    IMAGING  Ct Head Wo Contrast 12/26/2014  1. No acute intracranial abnormalities. 2. Mild chronic microvascular ischemic changes in cerebral white matter  Mr Brain Wo Contrast 12/26/2014   1. 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   Mr Jodene Nam Head Wo Contrast 12/26/2014  2. Occluded non-dominant right vertebral artery. Given history of right neck pain, consider cervical vertebral dissection. 3. High-grade left superior cerebellar artery stenosis.   Ct Angio Head & Neck W/cm &/or Wo/cm 12/27/2014   1. Right vertebral artery occlusion at its origin. Reconstitution of a small vertebral artery distally. 2. Patent and dominant left vertebral artery without stenosis. 3. No cervical carotid artery stenosis. 4. Moderate noncalcified plaque in the proximal brachiocephalic and left subclavian arteries with less than 50% stenosis. 5. No significant intracranial stenosis.     PHYSICAL EXAM Pleasant frail Caucasian lady currently not in distress. . Afebrile. Head is nontraumatic. Neck is supple without bruit.    Cardiac  exam no murmur or gallop. Lungs are clear to auscultation. Distal pulses are well felt. Neurological Exam ;  Awake  Alert oriented x 3. Normal speech and language.eye movements full without nystagmus.fundi were not visualized. Vision acuity and fields appear normal. Hearing is normal. Palatal movements are normal. Face symmetric. Tongue midline. Normal strength, tone, reflexes and coordination. Normal sensation. Gait deferred. ASSESSMENT/PLAN [name] is a 69 y.o. female with history of anxiety, GERD, Glaucoma, Rhinitis, hypothyroidism and diverticulosis (s/p laparoscopic sigmoid colectomy)  presenting with HA  and visual field loss. She did not receive IV t-PA due to delay in arrival.   Stroke:  right occipital infarct embolic likkely secondary to R VA occlusion (reported pain in neck prior to stroke) secondary to atherosclerosis source  resultant HA  MRI  R occipital cortex infarct  MRA  Occluded R VA, high grade L SCA stenosis  Carotid Doppler  pending   2D Echo  pending   LDL 138  HgbA1c pending  Heparin 5000 units sq tid for VTE prophylaxis Diet Heart Room service appropriate?: Yes; Fluid consistency:: Thin  no antithrombotic prior to admission, now on aspirin 325 mg orally every day. Given large vessel intracranial atherosclerosis (new L VA occlusion), patient should be treated with aspirin 81 mg and clopidogrel 75 mg orally every day x 3 months for secondary stroke prevention. After 3 months, change to plavix alone. Long-term dual antiplatelets are contraindicated due to risk for intracerebral hemorrhage.  Dr. Leonie Man discussed STROKE AF research trial with pt and husband. They are interested. [facility] will follow up with her.   Patient counseled to be compliant with her antithrombotic medications  Ongoing aggressive stroke risk factor management  Therapy recommendations:  No PT or OT needs  Disposition:  Return home  Hyperlipidemia  Home meds:  No statin  LDL 138, goal < 70  Add lipitor 40. Dr. Leonie Man discussed with pt.  Continue statin at discharge  Other Stroke Risk Factors  Advanced age  Former Cigarette smoker, quit smoking 36 years ago ETOH use  Family hx stroke (father)  Other Active Problems  Anxiety  Hypothyroidism  GERD  Glaucoma  Allergic rhinitis   Hospital day # [place] for Pager information 12/27/2014 1:15 PM  I have personally examined this patient, reviewed notes, independently viewed imaging studies, participated in medical decision making and plan of care. I  have made any additions or clarifications directly to the above note. Agree with note above.  She presented with right-sided posterior neck and back pain and peripheral vision loss on the left due to a right parieto-occipital embolic infarct likely from proximal right vertebral artery occlusion. She remains at risk for neurological worsening, recurrent stroke, TIAs and needs ongoing stroke evaluation and aggressive risk factor modification. I had a long discussion with the patient and husband at the bedside regarding treatment options, prognosis and plan for stroke evaluation and answered questions. The patient may possibly consider participation in the stroke AFIB trial if interested. She was given information to review and decide  Antony Contras, MD Medical Director [place] Pager: [phone] 12/27/2014 5:16 PM    To contact Stroke Continuity provider, please refer to [email or web address] After hours, contact General Neurology

## 2014-12-27 NOTE — Progress Notes (Signed)
[facility] Progress Note   [name]  RKY:[id number]  DOB: 07-Oct-1945  DOA: 12/26/2014 PCP: No primary care provider on file.  Brief narrative: [name] is a 69 y.o. female with a history of hypothyroidism, gastroesophageal reflux disease, Barrett's esophagus and glaucoma who presents with a complaint of headache and pain behind her right eye, right-sided neck pain and loss of peripheral vision in the left eye. MRI of the brain performed in the ER reveals an acute right occipital infarct. The patient was outside the time window for tPA.   Subjective: States that pain behind her right eye and neck has resolved. Vision is improved. No new focal weakness numbness or tingling.  Assessment/Plan: Principal Problem:   CVA (cerebral infarction) -Small acute right occipital infarct-symptoms nearly resolved - MRA and CTA of the head and neck reveal right vertebral artery occlusion- no dissection noted -Also noted is high-grade left superior cerebellar artery stenosis -Remaining stroke workup including A1c, fasting lipid panel, echocardiogram pending -Continue Plavix and Lipitor  Active Problems:    Hypothyroidism (acquired) -Status post treatment for Graves' disease - cont [place]    GERD without esophagitis -Continue PPI   Code Status:     Code Status Orders        Start     Ordered   12/26/14 2016  Full code   Continuous     12/26/14 2016     Family Communication:  Disposition Plan: Home when workup complete DVT prophylaxis: Heparin Consultants: Neuro hospitalist Procedures:  Antibiotics: Anti-infectives    None      Objective: Filed Weights   12/26/14 1145 12/26/14 2016  Weight: 55.339 kg (122 lb) 54.7 kg (120 lb 9.5 oz)   No intake or output data in the 24 hours ending 12/27/14 1320   Vitals Filed Vitals:   12/27/14 0440 12/27/14 0645 12/27/14 0841 12/27/14 1020  BP: 119/69 128/78 126/80 120/70  Pulse: 93 93 94 83  Temp:  97.3 F (36.3  C) 98.4 F (36.9 C) 97.9 F (36.6 C)  TempSrc:  Oral Oral Oral  Resp: 16 18 18 20   Height:      Weight:      SpO2: 98% 98% 98% 96%    Exam:  General:  Pt is alert, not in acute distress  HEENT: No icterus, No thrush, oral mucosa moist  Cardiovascular: regular rate and rhythm, S1/S2 No murmur  Respiratory: clear to auscultation bilaterally   Abdomen: Soft, +Bowel sounds, non tender, non distended, no guarding  MSK: No LE edema, cyanosis or clubbing  Data Reviewed: Basic Metabolic Panel:  Recent Labs Lab 12/26/14 1636 12/26/14 2037 12/27/14 0434  NA 137  --  138  K 4.8  --  4.1  CL 104  --  100*  CO2 26  --  28  GLUCOSE 100*  --  106*  BUN 17  --  15  CREATININE 0.73 0.65 0.87  CALCIUM 9.7  --  9.7   Liver Function Tests:  Recent Labs Lab 12/27/14 0434  AST 24  ALT 19  ALKPHOS 91  BILITOT 0.5  PROT 7.1  ALBUMIN 4.2   No results for input(s): LIPASE, AMYLASE in the last 168 hours. No results for input(s): AMMONIA in the last 168 hours. CBC:  Recent Labs Lab 12/26/14 1636 12/26/14 2037 12/27/14 0434  WBC 6.6 5.7 5.4  NEUTROABS 2.8  --   --   HGB 15.0 15.2* 15.6*  HCT 45.6 46.4* 48.2*  MCV 90.8 91.0 91.1  PLT 267 273 256   Cardiac Enzymes: No results for input(s): CKTOTAL, CKMB, CKMBINDEX, TROPONINI in the last 168 hours. BNP (last 3 results) No results for input(s): BNP in the last 8760 hours.  ProBNP (last 3 results) No results for input(s): PROBNP in the last 8760 hours.  CBG:  Recent Labs Lab 12/26/14 2036  GLUCAP 93    No results found for this or any previous visit (from the past 240 hour(s)).   Studies: Ct Angio Head W/cm &/or Wo Cm  12/27/2014  CLINICAL DATA:  Left vision loss. Right neck pain. Small right occipital infarct and occluded right vertebral artery on MRI/MRA. EXAM: CT ANGIOGRAPHY HEAD AND NECK TECHNIQUE: Multidetector CT imaging of the head and neck was performed using the standard protocol during bolus  administration of intravenous contrast. Multiplanar CT image reconstructions and MIPs were obtained to evaluate the vascular anatomy. Carotid stenosis measurements (when applicable) are obtained utilizing NASCET criteria, using the distal internal carotid diameter as the denominator. CONTRAST:  51mL OMNIPAQUE IOHEXOL 350 MG/ML SOLN COMPARISON:  Head MRI/ MRA 12/26/2014.  Head CT 12/26/2014. FINDINGS: CT HEAD Brain: Postcontrast head CT is without evidence of acute intracranial hemorrhage, mass, midline shift, or extra-axial fluid collection. Known subcentimeter right occipital infarct is not well seen by CT. Mild chronic small vessel ischemic disease is noted in the cerebral white matter. Ventricles and sulci are normal for age. Calvarium and skull base: No skull fracture or destructive osseous lesion. Paranasal sinuses: Mild right greater than left maxillary sinus mucosal thickening with evidence of prior functional sinus surgery. Clear mastoid air cells. Orbits: Unremarkable. CTA NECK Aortic arch: 3 vessel aortic arch with mild atherosclerotic plaque. There is predominantly noncalcified, eccentric plaque in the proximal brachiocephalic artery without significant stenosis. Moderate noncalcified plaque in the proximal left subclavian artery results in less than 50% stenosis. Right carotid system: Patent with mild noncalcified plaque in the common carotid artery. No stenosis or evidence of dissection. Mildly beaded appearance of the mid cervical ICA. Left carotid system: Patent with mild noncalcified plaque in the common carotid artery. No stenosis or evidence of dissection. Vertebral arteries: Left vertebral artery is patent and dominant without stenosis. Right vertebral artery is occluded at its origin. There is reconstitution of the V2 segments at the C4-5 level with the remainder of the right vertebral artery being patent though very small in caliber. Skeleton: Mild multilevel cervical disc and facet  degeneration. Other neck: Pleural-parenchymal scarring in the lung apices. CTA HEAD Anterior circulation: Internal carotid arteries are patent from skullbase to carotid termini with minimal atherosclerotic plaque and no stenosis. There is a patent anterior communicating artery. ACAs and MCAs are unremarkable. No intracranial aneurysm is identified. Posterior circulation: Intracranial vertebral arteries are patent with the left being dominant. Right V4 segment opacification may be retrograde given appearance on recent MRA. Left PICA, bilateral AICA, and bilateral SCA origins are patent. The high-grade proximal left SCA stenosis suggested on MRA is not confirmed on CTA and was likely artifactual. Basilar artery is patent without stenosis. PCAs are unremarkable. Posterior communicating arteries are not identified. Venous sinuses: Patent. Anatomic variants: None. Delayed phase: No abnormal enhancement. IMPRESSION: 1. Right vertebral artery occlusion at its origin. Reconstitution of a small vertebral artery distally. 2. Patent and dominant left vertebral artery without stenosis. 3. No cervical carotid artery stenosis. 4. Moderate noncalcified plaque in the proximal brachiocephalic and left subclavian arteries with less than 50% stenosis. 5. No significant intracranial stenosis. Electronically Signed   By: Zenia Resides  Jeralyn Ruths M.D.   On: 12/27/2014 09:51   Ct Head Wo Contrast  12/26/2014  CLINICAL DATA:  69 year old female with history of right-sided facial pain and right frontal headache since Thursday of this week. EXAM: CT HEAD WITHOUT CONTRAST TECHNIQUE: Contiguous axial images were obtained from the base of the skull through the vertex without intravenous contrast. COMPARISON:  No priors. FINDINGS: Patchy and confluent areas of decreased attenuation are noted throughout the deep and periventricular white matter of the cerebral hemispheres bilaterally, compatible with chronic microvascular ischemic disease. No acute  intracranial abnormalities. Specifically, no evidence of acute intracranial hemorrhage, no definite findings of acute/subacute cerebral ischemia, no mass, mass effect, hydrocephalus or abnormal intra or extra-axial fluid collections. Visualized paranasal sinuses and mastoids are well pneumatized. No acute displaced skull fractures are identified. IMPRESSION: 1. No acute intracranial abnormalities. 2. Mild chronic microvascular ischemic changes in cerebral white matter, as above. Electronically Signed   By: Vinnie Langton M.D.   On: 12/26/2014 13:10   Ct Angio Neck W/cm &/or Wo/cm  12/27/2014  CLINICAL DATA:  Left vision loss. Right neck pain. Small right occipital infarct and occluded right vertebral artery on MRI/MRA. EXAM: CT ANGIOGRAPHY HEAD AND NECK TECHNIQUE: Multidetector CT imaging of the head and neck was performed using the standard protocol during bolus administration of intravenous contrast. Multiplanar CT image reconstructions and MIPs were obtained to evaluate the vascular anatomy. Carotid stenosis measurements (when applicable) are obtained utilizing NASCET criteria, using the distal internal carotid diameter as the denominator. CONTRAST:  40mL OMNIPAQUE IOHEXOL 350 MG/ML SOLN COMPARISON:  Head MRI/ MRA 12/26/2014.  Head CT 12/26/2014. FINDINGS: CT HEAD Brain: Postcontrast head CT is without evidence of acute intracranial hemorrhage, mass, midline shift, or extra-axial fluid collection. Known subcentimeter right occipital infarct is not well seen by CT. Mild chronic small vessel ischemic disease is noted in the cerebral white matter. Ventricles and sulci are normal for age. Calvarium and skull base: No skull fracture or destructive osseous lesion. Paranasal sinuses: Mild right greater than left maxillary sinus mucosal thickening with evidence of prior functional sinus surgery. Clear mastoid air cells. Orbits: Unremarkable. CTA NECK Aortic arch: 3 vessel aortic arch with mild atherosclerotic  plaque. There is predominantly noncalcified, eccentric plaque in the proximal brachiocephalic artery without significant stenosis. Moderate noncalcified plaque in the proximal left subclavian artery results in less than 50% stenosis. Right carotid system: Patent with mild noncalcified plaque in the common carotid artery. No stenosis or evidence of dissection. Mildly beaded appearance of the mid cervical ICA. Left carotid system: Patent with mild noncalcified plaque in the common carotid artery. No stenosis or evidence of dissection. Vertebral arteries: Left vertebral artery is patent and dominant without stenosis. Right vertebral artery is occluded at its origin. There is reconstitution of the V2 segments at the C4-5 level with the remainder of the right vertebral artery being patent though very small in caliber. Skeleton: Mild multilevel cervical disc and facet degeneration. Other neck: Pleural-parenchymal scarring in the lung apices. CTA HEAD Anterior circulation: Internal carotid arteries are patent from skullbase to carotid termini with minimal atherosclerotic plaque and no stenosis. There is a patent anterior communicating artery. ACAs and MCAs are unremarkable. No intracranial aneurysm is identified. Posterior circulation: Intracranial vertebral arteries are patent with the left being dominant. Right V4 segment opacification may be retrograde given appearance on recent MRA. Left PICA, bilateral AICA, and bilateral SCA origins are patent. The high-grade proximal left SCA stenosis suggested on MRA is not confirmed on CTA and  was likely artifactual. Basilar artery is patent without stenosis. PCAs are unremarkable. Posterior communicating arteries are not identified. Venous sinuses: Patent. Anatomic variants: None. Delayed phase: No abnormal enhancement. IMPRESSION: 1. Right vertebral artery occlusion at its origin. Reconstitution of a small vertebral artery distally. 2. Patent and dominant left vertebral artery  without stenosis. 3. No cervical carotid artery stenosis. 4. Moderate noncalcified plaque in the proximal brachiocephalic and left subclavian arteries with less than 50% stenosis. 5. No significant intracranial stenosis. Electronically Signed   By: Logan Bores M.D.   On: 12/27/2014 09:51   Mr Jodene Nam Head Wo Contrast  12/26/2014  CLINICAL DATA:  Vision loss on the left.  Right neck pain EXAM: MRI HEAD WITHOUT CONTRAST MRA HEAD WITHOUT CONTRAST TECHNIQUE: Multiplanar, multiecho pulse sequences of the brain and surrounding structures were obtained without intravenous contrast. Angiographic images of the head were obtained using MRA technique without contrast. COMPARISON:  Brain MRI 07/02/2003 FINDINGS: MRI HEAD FINDINGS Calvarium and upper cervical spine: No focal marrow signal abnormality. Orbits: No significant findings. Sinuses and Mastoids: Clear. Brain: There is a sub cm focus of restricted diffusion within the low right occipital cortex consistent with acute infarct. No hemorrhagic conversion. Loss of flow related hypo intensity in the right vertebral artery, as described below. There is mild for age small-vessel ischemic change in the bilateral cerebral white matter, thinly confluent around around the frontal horns of the lateral ventricles. No previous cortical infarct detected. Normal cerebral volume for age. No mass lesion, hydrocephalus, or shift. MRA HEAD FINDINGS There is no flow related signal loss throughout the majority of the non dominant right vertebral artery, age indeterminate but presumably recent given the infarct. Fortunately, there is a large right AICA which is patent. No mural T1 hyperintensity on conventional imaging to suggest dissection. Symmetric left PICA and AICA which are widely patent. Symmetric superior cerebellar arteries with a high-grade proximal stenosis on the left. The posterior cerebral arteries are patent. There is no notable stenosis within the anterior circulation. No  major vessel occlusion. Negative for aneurysm. An anterior communicating artery is present. No posterior communicating arteries. These results were called by telephone at the time of interpretation on 12/26/2014 at 5:38 pm to Dr. Zenia Resides , who verbally acknowledged these results. IMPRESSION: 1. 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 2. Occluded non-dominant right vertebral artery. Given history of right neck pain, consider cervical vertebral dissection. 3. High-grade left superior cerebellar artery stenosis. Electronically Signed   By: Monte Fantasia M.D.   On: 12/26/2014 17:35   Mr Brain Wo Contrast  12/26/2014  CLINICAL DATA:  Vision loss on the left.  Right neck pain EXAM: MRI HEAD WITHOUT CONTRAST MRA HEAD WITHOUT CONTRAST TECHNIQUE: Multiplanar, multiecho pulse sequences of the brain and surrounding structures were obtained without intravenous contrast. Angiographic images of the head were obtained using MRA technique without contrast. COMPARISON:  Brain MRI 07/02/2003 FINDINGS: MRI HEAD FINDINGS Calvarium and upper cervical spine: No focal marrow signal abnormality. Orbits: No significant findings. Sinuses and Mastoids: Clear. Brain: There is a sub cm focus of restricted diffusion within the low right occipital cortex consistent with acute infarct. No hemorrhagic conversion. Loss of flow related hypo intensity in the right vertebral artery, as described below. There is mild for age small-vessel ischemic change in the bilateral cerebral white matter, thinly confluent around around the frontal horns of the lateral ventricles. No previous cortical infarct detected. Normal cerebral volume for age. No mass lesion, hydrocephalus, or shift. MRA HEAD FINDINGS  There is no flow related signal loss throughout the majority of the non dominant right vertebral artery, age indeterminate but presumably recent given the infarct. Fortunately, there is a large right AICA which is patent. No mural T1  hyperintensity on conventional imaging to suggest dissection. Symmetric left PICA and AICA which are widely patent. Symmetric superior cerebellar arteries with a high-grade proximal stenosis on the left. The posterior cerebral arteries are patent. There is no notable stenosis within the anterior circulation. No major vessel occlusion. Negative for aneurysm. An anterior communicating artery is present. No posterior communicating arteries. These results were called by telephone at the time of interpretation on 12/26/2014 at 5:38 pm to Dr. Zenia Resides , who verbally acknowledged these results. IMPRESSION: 1. 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 2. Occluded non-dominant right vertebral artery. Given history of right neck pain, consider cervical vertebral dissection. 3. High-grade left superior cerebellar artery stenosis. Electronically Signed   By: Monte Fantasia M.D.   On: 12/26/2014 17:35    Scheduled Meds:  Scheduled Meds: . [START ON 12/28/2014] aspirin EC  81 mg Oral Daily  . atorvastatin  40 mg Oral q1800  . clopidogrel  75 mg Oral Daily  . dorzolamide  1 drop Both Eyes Daily  . heparin  5,000 Units Subcutaneous 3 times per day  . latanoprost  1 drop Both Eyes QHS  . pantoprazole  40 mg Oral Q1200  . [START ON 01/01/2015] thyroid  15 mg Oral Once per day on Sun Sat  . thyroid  60 mg Oral Daily   Continuous Infusions:   Time spent on care of this patient: Refill min   [place], MD 12/27/2014, 1:20 PM  LOS: 1 day   [facility] Office  [phone] Pager - Text Page per [email or web address] If 7PM-7AM, please contact night-coverage [email or web address]

## 2014-12-28 ENCOUNTER — Inpatient Hospital Stay (HOSPITAL_COMMUNITY): Payer: Medicare Other

## 2014-12-28 ENCOUNTER — Encounter: Payer: Self-pay | Admitting: *Deleted

## 2014-12-28 DIAGNOSIS — I6789 Other cerebrovascular disease: Secondary | ICD-10-CM

## 2014-12-28 DIAGNOSIS — I63011 Cerebral infarction due to thrombosis of right vertebral artery: Secondary | ICD-10-CM

## 2014-12-28 DIAGNOSIS — Z006 Encounter for examination for normal comparison and control in clinical research program: Secondary | ICD-10-CM

## 2014-12-28 LAB — HEMOGLOBIN A1C
HEMOGLOBIN A1C: 6 % — AB (ref 4.8–5.6)
MEAN PLASMA GLUCOSE: 126 mg/dL

## 2014-12-28 MED ORDER — CLOPIDOGREL BISULFATE 75 MG PO TABS: 75.0000 mg | ORAL_TABLET | Freq: Every day | ORAL | Status: DC

## 2014-12-28 MED ORDER — ASPIRIN 81 MG PO TBEC: 81.0000 mg | DELAYED_RELEASE_TABLET | Freq: Every day | ORAL | Status: DC

## 2014-12-28 MED ORDER — ATORVASTATIN CALCIUM 40 MG PO TABS: 40.0000 mg | ORAL_TABLET | Freq: Every day | ORAL | Status: DC

## 2014-12-28 MED ORDER — PANTOPRAZOLE SODIUM 40 MG PO TBEC: 40.0000 mg | DELAYED_RELEASE_TABLET | Freq: Every day | ORAL | Status: DC

## 2014-12-28 NOTE — Progress Notes (Signed)
Patient consented to the STROKE -AF Research Study. All questions were encouraged and answered about the research study. Copy of the informed consent was given to patient. No protocol procedures were performed prior to consenting. Subject has been randomized to the STANDARD OF CARE arm of the study.  The purpose of the Stroke AF study is to compare the incidence of atrial fibrillation (AF) through 12 months between continuous cardiac rhythm monitoring with the Reveal LINQ Insertable Cardiac Monitor (ICM) (continuous monitoring arm) and standard of  care [facility]) medical treatment (control arm) in subjects with a recent ischemic stroke of presumed known origin.

## 2014-12-28 NOTE — Progress Notes (Signed)
  Echocardiogram 2D Echocardiogram has been performed.  Johny Chess 12/28/2014, 9:47 AM

## 2014-12-28 NOTE — Care Management Note (Signed)
Case Management Note  Patient Details  Name: [name] MRN: [id number] Date of Birth: Dec 02, 1945  Subjective/Objective:                    Action/Plan: Patient being discharged home today. Patient currently is without a PCP. CM spoke with the patient and she is planning on getting an appointment with [facility] physicians group after discharge. CM also left her with the [facility] information to assist her in finding a new PCP. No further needs per CM.   Expected Discharge Date:  12/29/14               Expected Discharge Plan:  Home/Self Care  In-House Referral:     Discharge planning Services     Post Acute Care Choice:    Choice offered to:     DME Arranged:    DME Agency:     HH Arranged:    HH Agency:     Status of Service:  Completed, signed off  Medicare Important Message Given:    Date Medicare IM Given:    Medicare IM give by:    Date Additional Medicare IM Given:    Additional Medicare Important Message give by:     If discussed at [place] of Stay Meetings, dates discussed:    Additional Comments:  Pollie Friar, RN 12/28/2014, 11:28 AM

## 2014-12-28 NOTE — Discharge Summary (Signed)
PATIENT DETAILS Name: Jamie CORPORAN Age: 69 y.o. Sex: female Date of Birth: 28-Oct-1945 MRN: [id number]. Admitting Physician: Barton Dubois, MD PCP:No primary care provider on file.  Admit Date: 12/26/2014 Discharge date: 12/28/2014  Recommendations for Outpatient Follow-up:  1. Repeat A1c and LDL in 3 months  2. Aspirin/Plavix for 3 months, then Plavix alone.  3. Ensure follow-up with neurology   PRIMARY DISCHARGE DIAGNOSIS:  Principal Problem:   CVA (cerebral infarction) Active Problems:   Anxiety   Hypothyroidism (acquired)   GERD without esophagitis   Glaucoma   Allergic rhinitis   Ischemic stroke (HCC)   Occlusion and stenosis of vertebral artery      PAST MEDICAL HISTORY: Past Medical History  Diagnosis Date  . Increased pressure in the eye   . Grave's disease   . Murmur   . GERD (gastroesophageal reflux disease)   . Morton's neuroma   . Hypothyroidism   . Anxiety   . Insomnia   . Diverticulosis   . Scoliosis   . Diverticulitis   . Barrett esophagus 2003  . Glaucoma   . Hepatitis A   . H. pylori infection     DISCHARGE MEDICATIONS: Current Discharge Medication List    START taking these medications   Details  aspirin EC 81 MG EC tablet Take 1 tablet (81 mg total) by mouth daily. ASA and Plavix for 3 months, and then Plavix alone Qty: 90 tablet, Refills: 0    atorvastatin (LIPITOR) 40 MG tablet Take 1 tablet (40 mg total) by mouth daily at 6 PM. Qty: 90 tablet, Refills: 0    clopidogrel (PLAVIX) 75 MG tablet Take 1 tablet (75 mg total) by mouth daily. Qty: 90 tablet, Refills: 0    pantoprazole (PROTONIX) 40 MG tablet Take 1 tablet (40 mg total) by mouth daily at 12 noon. Qty: 30 tablet, Refills: 0      CONTINUE these medications which have NOT CHANGED   Details  !! ARMOUR THYROID 15 MG tablet Take 1 tablet by mouth 2 (two) times a week. Saturday and Sunday    !! ARMOUR THYROID 60 MG tablet Take 1 tablet by mouth daily.     diazepam  (VALIUM) 5 MG tablet Take 5 mg by mouth at bedtime as needed for anxiety (sleep).     dorzolamide (TRUSOPT) 2 % ophthalmic solution Place 1 drop into both eyes daily.    fluticasone (FLONASE) 50 MCG/ACT nasal spray Place 2 sprays into both nostrils 2 (two) times daily as needed for allergies.     HYDROcodone-acetaminophen (NORCO) 7.5-325 MG per tablet Take 0.5-1 tablets by mouth every 6 (six) hours as needed for moderate pain or severe pain.  Refills: 0    latanoprost (XALATAN) 0.005 % ophthalmic solution Place 1 drop into both eyes at bedtime.    Multiple Vitamin (MULTIVITAMIN) tablet Take 1 tablet by mouth daily.     !! - Potential duplicate medications found. Please discuss with provider.    STOP taking these medications     ibuprofen (ADVIL,MOTRIN) 200 MG tablet         ALLERGIES:   Allergies  Allergen Reactions  . Neuromuscular Blocking Agents Other (See Comments)    Multiple symptoms, SOB, fatigue, weakness    BRIEF HPI:  See H&P, Labs, Consult and Test reports for all details in brief, patient is a 69 year old female with history of hypothyroidism who presented with left eye peripheral vision loss. Patient was admitted for further evaluation and treatment  CONSULTATIONS:  neurology  PERTINENT RADIOLOGIC STUDIES: Ct Angio Head W/cm &/or Wo Cm  12/27/2014  CLINICAL DATA:  Left vision loss. Right neck pain. Small right occipital infarct and occluded right vertebral artery on MRI/MRA. EXAM: CT ANGIOGRAPHY HEAD AND NECK TECHNIQUE: Multidetector CT imaging of the head and neck was performed using the standard protocol during bolus administration of intravenous contrast. Multiplanar CT image reconstructions and MIPs were obtained to evaluate the vascular anatomy. Carotid stenosis measurements (when applicable) are obtained utilizing NASCET criteria, using the distal internal carotid diameter as the denominator. CONTRAST:  65mL OMNIPAQUE IOHEXOL 350 MG/ML SOLN COMPARISON:  Head  MRI/ MRA 12/26/2014.  Head CT 12/26/2014. FINDINGS: CT HEAD Brain: Postcontrast head CT is without evidence of acute intracranial hemorrhage, mass, midline shift, or extra-axial fluid collection. Known subcentimeter right occipital infarct is not well seen by CT. Mild chronic small vessel ischemic disease is noted in the cerebral white matter. Ventricles and sulci are normal for age. Calvarium and skull base: No skull fracture or destructive osseous lesion. Paranasal sinuses: Mild right greater than left maxillary sinus mucosal thickening with evidence of prior functional sinus surgery. Clear mastoid air cells. Orbits: Unremarkable. CTA NECK Aortic arch: 3 vessel aortic arch with mild atherosclerotic plaque. There is predominantly noncalcified, eccentric plaque in the proximal brachiocephalic artery without significant stenosis. Moderate noncalcified plaque in the proximal left subclavian artery results in less than 50% stenosis. Right carotid system: Patent with mild noncalcified plaque in the common carotid artery. No stenosis or evidence of dissection. Mildly beaded appearance of the mid cervical ICA. Left carotid system: Patent with mild noncalcified plaque in the common carotid artery. No stenosis or evidence of dissection. Vertebral arteries: Left vertebral artery is patent and dominant without stenosis. Right vertebral artery is occluded at its origin. There is reconstitution of the V2 segments at the C4-5 level with the remainder of the right vertebral artery being patent though very small in caliber. Skeleton: Mild multilevel cervical disc and facet degeneration. Other neck: Pleural-parenchymal scarring in the lung apices. CTA HEAD Anterior circulation: Internal carotid arteries are patent from skullbase to carotid termini with minimal atherosclerotic plaque and no stenosis. There is a patent anterior communicating artery. ACAs and MCAs are unremarkable. No intracranial aneurysm is identified. Posterior  circulation: Intracranial vertebral arteries are patent with the left being dominant. Right V4 segment opacification may be retrograde given appearance on recent MRA. Left PICA, bilateral AICA, and bilateral SCA origins are patent. The high-grade proximal left SCA stenosis suggested on MRA is not confirmed on CTA and was likely artifactual. Basilar artery is patent without stenosis. PCAs are unremarkable. Posterior communicating arteries are not identified. Venous sinuses: Patent. Anatomic variants: None. Delayed phase: No abnormal enhancement. IMPRESSION: 1. Right vertebral artery occlusion at its origin. Reconstitution of a small vertebral artery distally. 2. Patent and dominant left vertebral artery without stenosis. 3. No cervical carotid artery stenosis. 4. Moderate noncalcified plaque in the proximal brachiocephalic and left subclavian arteries with less than 50% stenosis. 5. No significant intracranial stenosis. Electronically Signed   By: Logan Bores M.D.   On: 12/27/2014 09:51   Ct Head Wo Contrast  12/26/2014  CLINICAL DATA:  69 year old female with history of right-sided facial pain and right frontal headache since Thursday of this week. EXAM: CT HEAD WITHOUT CONTRAST TECHNIQUE: Contiguous axial images were obtained from the base of the skull through the vertex without intravenous contrast. COMPARISON:  No priors. FINDINGS: Patchy and confluent areas of decreased attenuation are noted throughout the  deep and periventricular white matter of the cerebral hemispheres bilaterally, compatible with chronic microvascular ischemic disease. No acute intracranial abnormalities. Specifically, no evidence of acute intracranial hemorrhage, no definite findings of acute/subacute cerebral ischemia, no mass, mass effect, hydrocephalus or abnormal intra or extra-axial fluid collections. Visualized paranasal sinuses and mastoids are well pneumatized. No acute displaced skull fractures are identified. IMPRESSION: 1. No  acute intracranial abnormalities. 2. Mild chronic microvascular ischemic changes in cerebral white matter, as above. Electronically Signed   By: Vinnie Langton M.D.   On: 12/26/2014 13:10   Ct Angio Neck W/cm &/or Wo/cm  12/27/2014  CLINICAL DATA:  Left vision loss. Right neck pain. Small right occipital infarct and occluded right vertebral artery on MRI/MRA. EXAM: CT ANGIOGRAPHY HEAD AND NECK TECHNIQUE: Multidetector CT imaging of the head and neck was performed using the standard protocol during bolus administration of intravenous contrast. Multiplanar CT image reconstructions and MIPs were obtained to evaluate the vascular anatomy. Carotid stenosis measurements (when applicable) are obtained utilizing NASCET criteria, using the distal internal carotid diameter as the denominator. CONTRAST:  3mL OMNIPAQUE IOHEXOL 350 MG/ML SOLN COMPARISON:  Head MRI/ MRA 12/26/2014.  Head CT 12/26/2014. FINDINGS: CT HEAD Brain: Postcontrast head CT is without evidence of acute intracranial hemorrhage, mass, midline shift, or extra-axial fluid collection. Known subcentimeter right occipital infarct is not well seen by CT. Mild chronic small vessel ischemic disease is noted in the cerebral white matter. Ventricles and sulci are normal for age. Calvarium and skull base: No skull fracture or destructive osseous lesion. Paranasal sinuses: Mild right greater than left maxillary sinus mucosal thickening with evidence of prior functional sinus surgery. Clear mastoid air cells. Orbits: Unremarkable. CTA NECK Aortic arch: 3 vessel aortic arch with mild atherosclerotic plaque. There is predominantly noncalcified, eccentric plaque in the proximal brachiocephalic artery without significant stenosis. Moderate noncalcified plaque in the proximal left subclavian artery results in less than 50% stenosis. Right carotid system: Patent with mild noncalcified plaque in the common carotid artery. No stenosis or evidence of dissection. Mildly  beaded appearance of the mid cervical ICA. Left carotid system: Patent with mild noncalcified plaque in the common carotid artery. No stenosis or evidence of dissection. Vertebral arteries: Left vertebral artery is patent and dominant without stenosis. Right vertebral artery is occluded at its origin. There is reconstitution of the V2 segments at the C4-5 level with the remainder of the right vertebral artery being patent though very small in caliber. Skeleton: Mild multilevel cervical disc and facet degeneration. Other neck: Pleural-parenchymal scarring in the lung apices. CTA HEAD Anterior circulation: Internal carotid arteries are patent from skullbase to carotid termini with minimal atherosclerotic plaque and no stenosis. There is a patent anterior communicating artery. ACAs and MCAs are unremarkable. No intracranial aneurysm is identified. Posterior circulation: Intracranial vertebral arteries are patent with the left being dominant. Right V4 segment opacification may be retrograde given appearance on recent MRA. Left PICA, bilateral AICA, and bilateral SCA origins are patent. The high-grade proximal left SCA stenosis suggested on MRA is not confirmed on CTA and was likely artifactual. Basilar artery is patent without stenosis. PCAs are unremarkable. Posterior communicating arteries are not identified. Venous sinuses: Patent. Anatomic variants: None. Delayed phase: No abnormal enhancement. IMPRESSION: 1. Right vertebral artery occlusion at its origin. Reconstitution of a small vertebral artery distally. 2. Patent and dominant left vertebral artery without stenosis. 3. No cervical carotid artery stenosis. 4. Moderate noncalcified plaque in the proximal brachiocephalic and left subclavian arteries with less than  50% stenosis. 5. No significant intracranial stenosis. Electronically Signed   By: Logan Bores M.D.   On: 12/27/2014 09:51   Mr Jodene Nam Head Wo Contrast  12/26/2014  CLINICAL DATA:  Vision loss on the  left.  Right neck pain EXAM: MRI HEAD WITHOUT CONTRAST MRA HEAD WITHOUT CONTRAST TECHNIQUE: Multiplanar, multiecho pulse sequences of the brain and surrounding structures were obtained without intravenous contrast. Angiographic images of the head were obtained using MRA technique without contrast. COMPARISON:  Brain MRI 07/02/2003 FINDINGS: MRI HEAD FINDINGS Calvarium and upper cervical spine: No focal marrow signal abnormality. Orbits: No significant findings. Sinuses and Mastoids: Clear. Brain: There is a sub cm focus of restricted diffusion within the low right occipital cortex consistent with acute infarct. No hemorrhagic conversion. Loss of flow related hypo intensity in the right vertebral artery, as described below. There is mild for age small-vessel ischemic change in the bilateral cerebral white matter, thinly confluent around around the frontal horns of the lateral ventricles. No previous cortical infarct detected. Normal cerebral volume for age. No mass lesion, hydrocephalus, or shift. MRA HEAD FINDINGS There is no flow related signal loss throughout the majority of the non dominant right vertebral artery, age indeterminate but presumably recent given the infarct. Fortunately, there is a large right AICA which is patent. No mural T1 hyperintensity on conventional imaging to suggest dissection. Symmetric left PICA and AICA which are widely patent. Symmetric superior cerebellar arteries with a high-grade proximal stenosis on the left. The posterior cerebral arteries are patent. There is no notable stenosis within the anterior circulation. No major vessel occlusion. Negative for aneurysm. An anterior communicating artery is present. No posterior communicating arteries. These results were called by telephone at the time of interpretation on 12/26/2014 at 5:38 pm to Dr. Zenia Resides , who verbally acknowledged these results. IMPRESSION: 1. 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 2. Occluded  non-dominant right vertebral artery. Given history of right neck pain, consider cervical vertebral dissection. 3. High-grade left superior cerebellar artery stenosis. Electronically Signed   By: Monte Fantasia M.D.   On: 12/26/2014 17:35   Mr Brain Wo Contrast  12/26/2014  CLINICAL DATA:  Vision loss on the left.  Right neck pain EXAM: MRI HEAD WITHOUT CONTRAST MRA HEAD WITHOUT CONTRAST TECHNIQUE: Multiplanar, multiecho pulse sequences of the brain and surrounding structures were obtained without intravenous contrast. Angiographic images of the head were obtained using MRA technique without contrast. COMPARISON:  Brain MRI 07/02/2003 FINDINGS: MRI HEAD FINDINGS Calvarium and upper cervical spine: No focal marrow signal abnormality. Orbits: No significant findings. Sinuses and Mastoids: Clear. Brain: There is a sub cm focus of restricted diffusion within the low right occipital cortex consistent with acute infarct. No hemorrhagic conversion. Loss of flow related hypo intensity in the right vertebral artery, as described below. There is mild for age small-vessel ischemic change in the bilateral cerebral white matter, thinly confluent around around the frontal horns of the lateral ventricles. No previous cortical infarct detected. Normal cerebral volume for age. No mass lesion, hydrocephalus, or shift. MRA HEAD FINDINGS There is no flow related signal loss throughout the majority of the non dominant right vertebral artery, age indeterminate but presumably recent given the infarct. Fortunately, there is a large right AICA which is patent. No mural T1 hyperintensity on conventional imaging to suggest dissection. Symmetric left PICA and AICA which are widely patent. Symmetric superior cerebellar arteries with a high-grade proximal stenosis on the left. The posterior cerebral arteries are patent. There is no  notable stenosis within the anterior circulation. No major vessel occlusion. Negative for aneurysm. An anterior  communicating artery is present. No posterior communicating arteries. These results were called by telephone at the time of interpretation on 12/26/2014 at 5:38 pm to Dr. Zenia Resides , who verbally acknowledged these results. IMPRESSION: 1. 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 2. Occluded non-dominant right vertebral artery. Given history of right neck pain, consider cervical vertebral dissection. 3. High-grade left superior cerebellar artery stenosis. Electronically Signed   By: Monte Fantasia M.D.   On: 12/26/2014 17:35     PERTINENT LAB RESULTS: CBC:  Recent Labs  12/26/14 2037 12/27/14 0434  WBC 5.7 5.4  HGB 15.2* 15.6*  HCT 46.4* 48.2*  PLT 273 256   CMET CMP     Component Value Date/Time   NA 138 12/27/2014 0434   K 4.1 12/27/2014 0434   CL 100* 12/27/2014 0434   CO2 28 12/27/2014 0434   GLUCOSE 106* 12/27/2014 0434   BUN 15 12/27/2014 0434   CREATININE 0.87 12/27/2014 0434   CALCIUM 9.7 12/27/2014 0434   PROT 7.1 12/27/2014 0434   ALBUMIN 4.2 12/27/2014 0434   AST 24 12/27/2014 0434   ALT 19 12/27/2014 0434   ALKPHOS 91 12/27/2014 0434   BILITOT 0.5 12/27/2014 0434   GFRNONAA >60 12/27/2014 0434   GFRAA >60 12/27/2014 0434    GFR Estimated Creatinine Clearance: 47.6 mL/min (by C-G formula based on Cr of 0.87). No results for input(s): LIPASE, AMYLASE in the last 72 hours. No results for input(s): CKTOTAL, CKMB, CKMBINDEX, TROPONINI in the last 72 hours. Invalid input(s): POCBNP No results for input(s): DDIMER in the last 72 hours.  Recent Labs  12/27/14 0434  HGBA1C 6.0*    Recent Labs  12/27/14 0434  CHOL 237*  HDL 61  LDLCALC 138*  TRIG 190*  CHOLHDL 3.9    Recent Labs  12/26/14 2037  TSH 1.014   No results for input(s): VITAMINB12, FOLATE, FERRITIN, TIBC, IRON, RETICCTPCT in the last 72 hours. Coags:  Recent Labs  12/26/14 1636  INR 0.95   Microbiology: No results found for this or any previous visit (from the past  240 hour(s)).   BRIEF HOSPITAL COURSE:   Principal Problem: Acute right occipital infarcts: Felt to be secondary to right vertebral artery occlusion. Nonfocal exam, with no restriction in the left peripheral vision on my exam. MRI brain showed right occipital cortex infarct. MRA brain showed occluded right vertebral artery and, high-grade left SCA stenosis. CTA neck confirmed right vertebral artery occlusion at the origin without dissection. 2-D echocardiogram negative for embolic source-EF was preserved. LDL 138-not at goal (Gold<70)-we will continue statins on discharge. A1c 6.0. Recommendations from neurology on to continue with aspirin and Plavix for 3 months, after 3 months change to Plavix alone.  Active Problems: Hypothyroidism: Continue with Armour thryoid  GERD: Continue PPI.  Dyslipidemia: See above  Anxiety: Continue with as needed Valium  TODAY-DAY OF DISCHARGE:  Subjective:   Nykerria Macconnell today has no headache,no chest abdominal pain,no new weakness tingling or numbness, feels much better wants to go home today.   Objective:   Blood pressure 122/79, pulse 99, temperature 97.6 F (36.4 C), temperature source Oral, resp. rate 20, height 5\' 2"  (1.575 m), weight 54.7 kg (120 lb 9.5 oz), SpO2 98 %.  Intake/Output Summary (Last 24 hours) at 12/28/14 1224 Last data filed at 12/28/14 0806  Gross per 24 hour  Intake    240 ml  Output  0 ml  Net    240 ml   Filed Weights   12/26/14 1145 12/26/14 2016  Weight: 55.339 kg (122 lb) 54.7 kg (120 lb 9.5 oz)    Exam Awake Alert, Oriented *3, No new F.N deficits, Normal affect Hart.AT,PERRAL Supple Neck,No JVD, No cervical lymphadenopathy appriciated.  Symmetrical Chest wall movement, Good air movement bilaterally, CTAB RRR,No Gallops,Rubs or new Murmurs, No Parasternal Heave +ve B.Sounds, Abd Soft, Non tender, No organomegaly appriciated, No rebound -guarding or rigidity. No Cyanosis, Clubbing or edema, No new Rash or  bruise  DISCHARGE CONDITION: Stable  DISPOSITION: Home  DISCHARGE INSTRUCTIONS:    Activity:  As tolerated   Get Medicines reviewed and adjusted: Please take all your medications with you for your next visit with your Primary MD  Please request your Primary MD to go over all hospital tests and procedure/radiological results at the follow up, please ask your Primary MD to get all Hospital records sent to his/her office.  If you experience worsening of your admission symptoms, develop shortness of breath, life threatening emergency, suicidal or homicidal thoughts you must seek medical attention immediately by calling 911 or calling your MD immediately  if symptoms less severe.  You must read complete instructions/literature along with all the possible adverse reactions/side effects for all the Medicines you take and that have been prescribed to you. Take any new Medicines after you have completely understood and accpet all the possible adverse reactions/side effects.   Do not drive when taking Pain medications.   Do not take more than prescribed Pain, Sleep and Anxiety Medications  Special Instructions: If you have smoked or chewed Tobacco  in the last 2 yrs please stop smoking, stop any regular Alcohol  and or any Recreational drug use.  Wear Seat belts while driving.  Please note  You were cared for by a hospitalist during your hospital stay. Once you are discharged, your primary care physician will handle any further medical issues. Please note that NO REFILLS for any discharge medications will be authorized once you are discharged, as it is imperative that you return to your primary care physician (or establish a relationship with a primary care physician if you do not have one) for your aftercare needs so that they can reassess your need for medications and monitor your lab values.   Diet recommendation: Heart Healthy diet  Discharge Instructions    Ambulatory referral to  Neurology    Complete by:  As directed   Please schedule post stroke follow up in 2 months.     Call MD for:  extreme fatigue    Complete by:  As directed      Call MD for:  persistant dizziness or light-headedness    Complete by:  As directed      Diet - low sodium heart healthy    Complete by:  As directed      Increase activity slowly    Complete by:  As directed            Follow-up Information    Follow up with SETHI,PRAMOD, MD In 2 months.   Specialties:  Neurology, Radiology   Why:  Stroke Clinic, Office will call you with appointment date & time   Contact information:   [place] [address] [phone]       Schedule an appointment as soon as possible for a visit to follow up.   Why:  Hospital follow up   Contact information:  Primary care practitioner     Total Time spent on discharge equals  45 minutes.  SignedOren Binet 12/28/2014 12:24 PM

## 2014-12-28 NOTE — Progress Notes (Signed)
STROKE TEAM PROGRESS NOTE   SUBJECTIVE (INTERVAL HISTORY) Patient is sitting up in the chair at the bedside. She has signed up for research study.    OBJECTIVE Temp:  [97.6 F (36.4 C)-98.5 F (36.9 C)] 97.6 F (36.4 C) (10/18 1020) Pulse Rate:  [72-99] 99 (10/18 1020) Cardiac Rhythm:  [-] Normal sinus rhythm (10/18 0700) Resp:  [20] 20 (10/18 1020) BP: (112-122)/(73-87) 122/79 mmHg (10/18 1020) SpO2:  [96 %-99 %] 98 % (10/18 1020)  CBC:   Recent Labs Lab 12/26/14 1636 12/26/14 2037 12/27/14 0434  WBC 6.6 5.7 5.4  NEUTROABS 2.8  --   --   HGB 15.0 15.2* 15.6*  HCT 45.6 46.4* 48.2*  MCV 90.8 91.0 91.1  PLT 267 273 174    Basic Metabolic Panel:   Recent Labs Lab 12/26/14 1636 12/26/14 2037 12/27/14 0434  NA 137  --  138  K 4.8  --  4.1  CL 104  --  100*  CO2 26  --  28  GLUCOSE 100*  --  106*  BUN 17  --  15  CREATININE 0.73 0.65 0.87  CALCIUM 9.7  --  9.7    Lipid Panel:     Component Value Date/Time   CHOL 237* 12/27/2014 0434   TRIG 190* 12/27/2014 0434   HDL 61 12/27/2014 0434   CHOLHDL 3.9 12/27/2014 0434   VLDL 38 12/27/2014 0434   LDLCALC 138* 12/27/2014 0434   HgbA1c:  Lab Results  Component Value Date   HGBA1C 6.0* 12/27/2014   Urine Drug Screen: No results found for: LABOPIA, COCAINSCRNUR, LABBENZ, AMPHETMU, THCU, LABBARB    IMAGING  Ct Head Wo Contrast 12/26/2014  1. No acute intracranial abnormalities. 2. Mild chronic microvascular ischemic changes in cerebral white matter  Mr Brain Wo Contrast 12/26/2014   1. 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   Mr Jodene Nam Head Wo Contrast 12/26/2014  2. Occluded non-dominant right vertebral artery. Given history of right neck pain, consider cervical vertebral dissection. 3. High-grade left superior cerebellar artery stenosis.   Ct Angio Head & Neck W/cm &/or Wo/cm 12/27/2014   1. Right vertebral artery occlusion at its origin. Reconstitution of a small vertebral artery  distally. 2. Patent and dominant left vertebral artery without stenosis. 3. No cervical carotid artery stenosis. 4. Moderate noncalcified plaque in the proximal brachiocephalic and left subclavian arteries with less than 50% stenosis. 5. No significant intracranial stenosis.    2D echo  - Left ventricle: The cavity size was normal. Systolic function wasnormal. The estimated ejection fraction was in the range of 60%to 65%. Wall motion was normal; there were no regional wallmotion abnormalities. Doppler parameters are consistent withabnormal left ventricular relaxation (grade 1 diastolicdysfunction). There was no evidence of elevated ventricularfilling pressure by Doppler parameters. - Aortic valve: Trileaflet; normal thickness leaflets. There was noregurgitation. - Aortic root: The aortic root was normal in size. - Mitral valve: Mildly thickened leaflets . There was trivial regurgitation. - Left atrium: The atrium was normal in size. - Right ventricle: Systolic function was normal. - Right atrium: The atrium was normal in size. - Tricuspid valve: There was trivial regurgitation. - Pulmonic valve: There was no regurgitation. - Pulmonary arteries: Systolic pressure was within the normalrange. - Inferior vena cava: The vessel was normal in size. - Pericardium, extracardiac: There was no pericardial effusion.   PHYSICAL EXAM Pleasant frail Caucasian lady currently not in distress. . Afebrile. Head is nontraumatic. Neck is supple without bruit.  Cardiac exam no murmur or gallop. Lungs are clear to auscultation. Distal pulses are well felt. Neurological Exam ;  Awake  Alert oriented x 3. Normal speech and language.eye movements full without nystagmus.fundi were not visualized. Vision acuity and fields appear normal. Hearing is normal. Palatal movements are normal. Face symmetric. Tongue midline. Normal strength, tone, reflexes and coordination. Normal sensation. Gait  deferred.   ASSESSMENT/PLAN [name] is a 69 y.o. female with history of anxiety, GERD, Glaucoma, Rhinitis, hypothyroidism and diverticulosis (s/p laparoscopic sigmoid colectomy)  presenting with HA and visual field loss. She did not receive IV t-PA due to delay in arrival.   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 Research group will follow up with her  resultant HA improving  MRI  R occipital cortex infarct  MRA  Occluded R VA, high grade L SCA stenosis  CTA confirmed occlusion R VA at origin, no dissection, source of stroke  2D Echo  No source of embolus  No need for LE doppler from stroke standpoint  LDL 138  HgbA1c 6.0  Heparin 5000 units sq tid for VTE prophylaxis Diet Heart Room service appropriate?: Yes; Fluid consistency:: Thin  no antithrombotic prior to admission, now on aspirin 325 mg orally every day. Given large vessel intracranial atherosclerosis (new L VA occlusion), patient should be treated with aspirin 81 mg and clopidogrel 75 mg orally every day x 3 months for secondary stroke prevention. After 3 months, change to plavix alone. Long-term dual antiplatelets are contraindicated due to risk for intracerebral hemorrhage.  Patient counseled to be compliant with her antithrombotic medications  Ongoing aggressive stroke risk factor management  Therapy recommendations:  No PT or OT needs  Disposition:  Return home  Patient instructed to avoid neck flexion/extension in yoga - has does yoga less than 1 year  NOTHING FURTHER TO ADD FROM THE STROKE STANDPOINT Patient has a 10-15% risk of having another stroke over the next year, the highest risk is within 2 weeks of the most recent stroke/TIA (risk of having a stroke following a stroke or TIA is the same). Ongoing risk factor control by Primary Care  Physician Stroke Service will sign off. Please call should any needs arise. Follow-up Stroke Clinic at [facility] with Dr. Dionne Bucy in 2 months, order placed.   Hyperlipidemia  Home meds:  No statin  LDL 138, goal < 70  Add lipitor 40. Dr. Leonie Man discussed with pt.  Continue statin at discharge  Other Stroke Risk Factors  Advanced age  Former Cigarette smoker, quit smoking 36 years ago ETOH use  Family hx stroke (father)  Other Active Problems  Anxiety  Hypothyroidism  GERD  Glaucoma  Allergic rhinitis   Hospital day # 2  Radene [facility] for Pager information 12/28/2014 10:59 AM  I have personally examined this patient, reviewed notes, independently viewed imaging studies, participated in medical decision making and plan of care. I have made any additions or clarifications directly to the above note. Agree with note above.    Antony Contras, MD Medical Director [facility] Stroke [facility] Pager: [phone] 12/28/2014 5:46 PM   To contact Stroke Continuity provider, please refer to [email or web address] After hours, contact General Neurology

## 2014-12-28 NOTE — Progress Notes (Signed)
Pt discharged from hospital per MD orders. Pt educated on discharge instructions. Pt verbalized understanding of instructions from RN. Removed IV. Pt exited hospital via stretcher.

## 2015-01-26 ENCOUNTER — Encounter: Payer: Self-pay | Admitting: *Deleted

## 2015-01-26 DIAGNOSIS — Z006 Encounter for examination for normal comparison and control in clinical research program: Secondary | ICD-10-CM

## 2015-01-26 NOTE — Progress Notes (Signed)
1 Month STROKE -AF Research Study follow up appointment see worksheets

## 2015-03-15 ENCOUNTER — Ambulatory Visit (INDEPENDENT_AMBULATORY_CARE_PROVIDER_SITE_OTHER): Payer: Medicare Other | Admitting: Neurology

## 2015-03-15 ENCOUNTER — Encounter: Payer: Self-pay | Admitting: Neurology

## 2015-03-15 ENCOUNTER — Ambulatory Visit: Payer: Self-pay | Admitting: Neurology

## 2015-03-15 VITALS — BP 117/73 | HR 84 | Ht 62.25 in | Wt 124.0 lb

## 2015-03-15 DIAGNOSIS — I6501 Occlusion and stenosis of right vertebral artery: Secondary | ICD-10-CM

## 2015-03-15 NOTE — Progress Notes (Signed)
[facility] [address] [place]. [place] [address] [phone]       OFFICE FOLLOW-UP NOTE  Ms. Jamie NEUROHR Date of Birth:  1946/03/09 Medical Record Number:  [id number]   HPI: [name] is a 70 year old Caucasian lady seen today for the first office follow-up visit following admission to [facility] in October 2016 for stroke.[name] is an 70 y.o. female who reports that she went to bed normal on Wednesday night, 12/22/2014 at 2200 (LKW). When she awakened on Thursday she noted right sided neck pain and pain behind her right eye. She then noted that her peripheral vision was restricted to the left. She thought this was secondary to her sinuses but with no improvement in her symptoms over the past few days she presented for evaluation. She reports that her symptoms are somewhat positional and worse when she is sitting up. She currently has no headache. Her symptoms have not restricted her activities and she has continued to drive and work. NIHSS of 0. MRankin: 0. Patient was not administered TPA secondary to delay in arrival. She was admitted for further evaluation and treatment. CT scan of the head on admission showed no acute abnormality and only mild changes of chronic microvascular ischemia. I personally reviewed all brain imaging studies of this admission. 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 There was high-grade stenosis noted in the left superior cerebellar artery as well. CT angiogram of the neck and brain were subsequently obtained which confirmed these findings. LDL cholesterol was elevated 138.  Transthoracic echo showed normal ejection fraction without cardiac source of embolism.  She states she's done well since discharge without recurrent stroke or TIA symptoms. She however did not tolerate Lipitor due to itching and has  been changed to Crestor which she seemed to be tolerating better. She remains on aspirin and Plavix which is tolerating well with only minor bruising but no bleeding episodes. She has kept her outpatient follow-up visit for the stroke A. fib trial in which she was randomized to the medical arm without loop recorder implant.  ROS:   14 system review of systems is positive for  fatigue only and all other systems negative PMH:  Past Medical History  Diagnosis Date  . Increased pressure in the eye   . Grave's disease   . Murmur   . GERD (gastroesophageal reflux disease)   . Morton's neuroma   . Hypothyroidism   . Anxiety   . Insomnia   . Diverticulosis   . Scoliosis   . Diverticulitis   . Barrett esophagus 2003  . Glaucoma   . H. pylori infection   . Hepatitis A     in her 53 from food    Social History:  Social History   Social History  . Marital Status: Married    Spouse Name: N/A  . Number of Children: 1  . Years of Education: N/A   Occupational History  . 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 Smoking status: Former Smoker    Types: Cigarettes    Quit date: 03/12/1978  . Smokeless tobacco: Never Used     Comment: quit in 1978  . Alcohol Use: 0.6 oz/week    0 Standard drinks or equivalent, 1 Glasses of wine per week     Comment:  SOCIALLY  . Drug Use: No  . Sexual Activity: Not on file   Other Topics Concern  . Not on file   Social History Narrative    Medications:   Current Outpatient Prescriptions on File Prior to Visit  Medication Sig Dispense Refill  . ARMOUR THYROID 15 MG tablet Take 1 tablet by mouth 2 (two) times a week. Saturday and Sunday    . ARMOUR THYROID 60 MG tablet Take 1 tablet by mouth daily.     Marland Kitchen aspirin EC 81 MG EC tablet Take 1 tablet (81 mg total) by mouth daily. ASA and Plavix for 3 months, and then Plavix alone 90 tablet 0  . atorvastatin (LIPITOR) 40 MG tablet Take 1 tablet (40 mg  total) by mouth daily at 6 PM. 90 tablet 0  . clopidogrel (PLAVIX) 75 MG tablet Take 1 tablet (75 mg total) by mouth daily. 90 tablet 0  . diazepam (VALIUM) 5 MG tablet Take 5 mg by mouth at bedtime as needed for anxiety (sleep).     . dorzolamide (TRUSOPT) 2 % ophthalmic solution Place 1 drop into both eyes daily.    . fluticasone (FLONASE) 50 MCG/ACT nasal spray Place 2 sprays into both nostrils 2 (two) times daily as needed for allergies.     Marland Kitchen HYDROcodone-acetaminophen (NORCO) 7.5-325 MG per tablet Take 0.5-1 tablets by mouth every 6 (six) hours as needed for moderate pain or severe pain.   0  . latanoprost (XALATAN) 0.005 % ophthalmic solution Place 1 drop into both eyes at bedtime.    . Multiple Vitamin (MULTIVITAMIN) tablet Take 1 tablet by mouth daily.    . pantoprazole (PROTONIX) 40 MG tablet Take 1 tablet (40 mg total) by mouth daily at 12 noon. 30 tablet 0   No current facility-administered medications on file prior to visit.    Allergies:   Allergies  Allergen Reactions  . Neuromuscular Blocking Agents Other (See Comments)    Multiple symptoms, SOB, fatigue, weakness    Physical Exam General: frail petite pleasant elderly Caucasian lady, seated, in no evident distress Head: head normocephalic and atraumatic.  Neck: supple with no carotid or supraclavicular bruits Cardiovascular: regular rate and rhythm, no murmurs Musculoskeletal: no deformity Skin:  no rash/petichiae Vascular:  Normal pulses all extremities Filed Vitals:   03/15/15 1514  BP: 117/73  Pulse: 84   Neurologic Exam Mental Status: Awake and fully alert. Oriented to place and time. Recent and remote memory intact. Attention span, concentration and fund of knowledge appropriate. Mood and affect appropriate.  Cranial Nerves: Fundoscopic exam reveals sharp disc margins. Pupils equal, briskly reactive to light. Extraocular movements full without nystagmus. Visual fields full to confrontation. Hearing intact.  Facial sensation intact. Face, tongue, palate moves normally and symmetrically.  Motor: Normal bulk and tone. Normal strength in all tested extremity muscles. Sensory.: intact to touch ,pinprick .position and vibratory sensation.  Coordination: Rapid alternating movements normal in all extremities. Finger-to-nose and heel-to-shin performed accurately bilaterally. Gait and Station: Arises from chair without difficulty. Stance is normal. Gait demonstrates normal stride length and balance . Able to heel, toe and tandem walk without difficulty.  Reflexes: 1+ and symmetric. Toes downgoing.   NIHSS 0 Modified Rankin 1  ASSESSMENT: 25 year Caucasian lady with right occipital infarcts in October 2016 secondary to right vertebral artery occlusion. Vascular risk factors of hyperlipidemia, cerebrovascular disease. Patient is currently part sparing in the stroke atrial fibrillation trial and randomized to the standard of care arm ( no loop  recorder implant)    PLAN: I had a long d/w patient about her recent stroke, risk for recurrent stroke/TIAs, personally independently reviewed imaging studies and stroke evaluation results and answered questions.Continue aspirin 81 and Plavix 75 mg daily for 2 more weeks and then discontinue aspirin and stay on Plavix alone for secondary stroke prevention and maintain strict control of hypertension with blood pressure goal below 130/90, diabetes with hemoglobin A1c goal below 6.5% and lipids with LDL cholesterol goal below 70 mg/dL. I also advised the patient to eat a healthy diet with plenty of whole grains, cereals, fruits and vegetables, exercise regularly and maintain ideal body weight. Patient complained of transient discoloration and pain in her left index finger. I advised her to contact her primary care physician to his seek further evaluation for that has a do not think that is a primary neurological issue. Followup in the future with me in 6 months or call earlier if  necessary. Greater than 50% of time during this 25 minute visit was spent on counseling,explanation of diagnosis, planning of further management, discussion with patient and family and coordination of care Antony Contras, MD Note: This document was prepared with digital dictation and possible smart phrase technology. Any transcriptional errors that result from this process are unintentional

## 2015-03-15 NOTE — Patient Instructions (Signed)
I had a long d/w patient about her recent stroke, risk for recurrent stroke/TIAs, personally independently reviewed imaging studies and stroke evaluation results and answered questions.Continue aspirin 81 and Plavix 75 mg daily for 2 more weeks and then discontinue aspirin and stay on Plavix alone for secondary stroke prevention and maintain strict control of hypertension with blood pressure goal below 130/90, diabetes with hemoglobin A1c goal below 6.5% and lipids with LDL cholesterol goal below 70 mg/dL. I also advised the patient to eat a healthy diet with plenty of whole grains, cereals, fruits and vegetables, exercise regularly and maintain ideal body weight. Patient complained of transient discoloration and pain in her left index finger. I advised her to contact her primary care physician to his seek further evaluation for that has a do not think that is a primary neurological issue. Followup in the future with me in 6 months or call earlier if necessary. Stroke Prevention Some medical conditions and behaviors are associated with an increased chance of having a stroke. You may prevent a stroke by making healthy choices and managing medical conditions. HOW CAN I REDUCE MY RISK OF HAVING A STROKE?   Stay physically active. Get at least 30 minutes of activity on most or all days.  Do not smoke. It may also be helpful to avoid exposure to secondhand smoke.  Limit alcohol use. Moderate alcohol use is considered to be:  No more than 2 drinks per day for men.  No more than 1 drink per day for nonpregnant women.  Eat healthy foods. This involves:  Eating 5 or more servings of fruits and vegetables a day.  Making dietary changes that address high blood pressure (hypertension), high cholesterol, diabetes, or obesity.  Manage your cholesterol levels.  Making food choices that are high in fiber and low in saturated fat, trans fat, and cholesterol may control cholesterol levels.  Take any  prescribed medicines to control cholesterol as directed by your health care provider.  Manage your diabetes.  Controlling your carbohydrate and sugar intake is recommended to manage diabetes.  Take any prescribed medicines to control diabetes as directed by your health care provider.  Control your hypertension.  Making food choices that are low in salt (sodium), saturated fat, trans fat, and cholesterol is recommended to manage hypertension.  Ask your health care provider if you need treatment to lower your blood pressure. Take any prescribed medicines to control hypertension as directed by your health care provider.  If you are 73-60 years of age, have your blood pressure checked every 3-5 years. If you are 73 years of age or older, have your blood pressure checked every year.  Maintain a healthy weight.  Reducing calorie intake and making food choices that are low in sodium, saturated fat, trans fat, and cholesterol are recommended to manage weight.  Stop drug abuse.  Avoid taking birth control pills.  Talk to your health care provider about the risks of taking birth control pills if you are over 22 years old, smoke, get migraines, or have ever had a blood clot.  Get evaluated for sleep disorders (sleep apnea).  Talk to your health care provider about getting a sleep evaluation if you snore a lot or have excessive sleepiness.  Take medicines only as directed by your health care provider.  For some people, aspirin or blood thinners (anticoagulants) are helpful in reducing the risk of forming abnormal blood clots that can lead to stroke. If you have the irregular heart rhythm of atrial fibrillation,  you should be on a blood thinner unless there is a good reason you cannot take them.  Understand all your medicine instructions.  Make sure that other conditions (such as anemia or atherosclerosis) are addressed. SEEK IMMEDIATE MEDICAL CARE IF:   You have sudden weakness or numbness  of the face, arm, or leg, especially on one side of the body.  Your face or eyelid droops to one side.  You have sudden confusion.  You have trouble speaking (aphasia) or understanding.  You have sudden trouble seeing in one or both eyes.  You have sudden trouble walking.  You have dizziness.  You have a loss of balance or coordination.  You have a sudden, severe headache with no known cause.  You have new chest pain or an irregular heartbeat. Any of these symptoms may represent a serious problem that is an emergency. Do not wait to see if the symptoms will go away. Get medical help at once. Call your local emergency services (911 in [place] Do not drive yourself to the hospital.   This information is not intended to replace advice given to you by your health care provider. Make sure you discuss any questions you have with your health care provider.   Document Released: 04/05/2004 Document Revised: 03/19/2014 Document Reviewed: 08/29/2012 Elsevier Interactive Patient Education [employer].

## 2015-03-28 ENCOUNTER — Other Ambulatory Visit: Payer: Self-pay

## 2015-03-28 ENCOUNTER — Telehealth: Payer: Self-pay | Admitting: Neurology

## 2015-03-28 DIAGNOSIS — I639 Cerebral infarction, unspecified: Secondary | ICD-10-CM

## 2015-03-28 MED ORDER — CLOPIDOGREL BISULFATE 75 MG PO TABS: 75.0000 mg | ORAL_TABLET | Freq: Every day | ORAL | Status: DC

## 2015-03-28 NOTE — Telephone Encounter (Signed)
Refill done for 30 days for plavix.

## 2015-03-28 NOTE — Telephone Encounter (Signed)
Pt needs rx of Plavix 75 mg. May call pt at [phone]

## 2015-03-28 NOTE — Telephone Encounter (Signed)
Rn call patient about refill of plavix 75mg . Pt explain that the hospital prescribed the aspirin and plavix for her. Pt was last seen by Dr.Sethi on 03/15/2015 for hospital follow up. Pt will be seeing her new PCP Dr. Thressa Sheller for new patient consultation for PCP. Rn did 30 day refill for plavix, until she sees her PCP. Patient will call if PCP wants Dr.Sethi to prescribed the plavix ongoing. Pt stated the PA at the office prescribed her crestor medication.

## 2015-06-24 ENCOUNTER — Other Ambulatory Visit: Payer: Self-pay

## 2015-06-24 DIAGNOSIS — Z1231 Encounter for screening mammogram for malignant neoplasm of breast: Secondary | ICD-10-CM

## 2015-07-04 ENCOUNTER — Encounter: Payer: Self-pay | Admitting: *Deleted

## 2015-07-04 NOTE — Progress Notes (Unsigned)
STROKE -AF 6 month follow up visit completed by Hillard Danker, RN (see source in binder) New ICF was signed on HIPPA page but not main consent. Will mail to patient for signature.

## 2015-07-06 ENCOUNTER — Ambulatory Visit
Admission: RE | Admit: 2015-07-06 | Discharge: 2015-07-06 | Disposition: A | Discharge status: Home / Self Care | Payer: Medicare Other | Source: Ambulatory Visit

## 2015-07-06 DIAGNOSIS — Z1231 Encounter for screening mammogram for malignant neoplasm of breast: Secondary | ICD-10-CM

## 2015-09-29 ENCOUNTER — Ambulatory Visit (INDEPENDENT_AMBULATORY_CARE_PROVIDER_SITE_OTHER): Payer: Medicare Other | Admitting: Neurology

## 2015-09-29 ENCOUNTER — Encounter: Payer: Self-pay | Admitting: Neurology

## 2015-09-29 VITALS — BP 104/66 | HR 72 | Wt 120.8 lb

## 2015-09-29 DIAGNOSIS — I6990 Unspecified sequelae of unspecified cerebrovascular disease: Secondary | ICD-10-CM | POA: Diagnosis not present

## 2015-09-29 NOTE — Patient Instructions (Signed)
I had a long d/w patient about her remote stroke, risk for recurrent stroke/TIAs, personally independently reviewed imaging studies and stroke evaluation results and answered questions.Continue Plavix   for secondary stroke prevention and maintain strict control of hypertension with blood pressure goal below 130/90, diabetes with hemoglobin A1c goal below 6.5% and lipids with LDL cholesterol goal below 70 mg/dL. I also advised the patient to eat a healthy diet with plenty of whole grains, cereals, fruits and vegetables, exercise regularly and maintain ideal body weight. I recommend she start taking coenzyme Q 10 200 mg daily to help with her statin myalgias. Followup in the future with me in  1 year or call earlier if necessary

## 2015-09-29 NOTE — Progress Notes (Signed)
[facility] [address] [place]. [place] [address] [phone]       OFFICE FOLLOW-UP NOTE  Jamie. Jamie AVEY Date of Birth:  1945-07-12 Medical Record Number:  [id number]   HPI: [name] is a 70 year old Caucasian lady seen today for the first office follow-up visit following admission to [facility] in October 2016 for stroke.[name] is an 70 Fox.o. female who reports that she went to bed normal on Wednesday night, 12/22/2014 at 2200 (LKW). When she awakened on Thursday she noted right sided neck pain and pain behind her right eye. She then noted that her peripheral vision was restricted to the left. She thought this was secondary to her sinuses but with no improvement in her symptoms over the past few days she presented for evaluation. She reports that her symptoms are somewhat positional and worse when she is sitting up. She currently has no headache. Her symptoms have not restricted her activities and she has continued to drive and work. NIHSS of 0. MRankin: 0. Patient was not administered TPA secondary to delay in arrival. She was admitted for further evaluation and treatment. CT scan of the head on admission showed no acute abnormality and only mild changes of chronic microvascular ischemia. I personally reviewed all brain imaging studies of this admission. 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 There was high-grade stenosis noted in the left superior cerebellar artery as well. CT angiogram of the neck and brain were subsequently obtained which confirmed these findings. LDL cholesterol was elevated 138.  Transthoracic echo showed normal ejection fraction without cardiac source of embolism.  She states she's done well since discharge without recurrent stroke or TIA symptoms. She however did not tolerate Lipitor due to itching and has  been changed to Crestor which she seemed to be tolerating better. She remains on aspirin and Plavix which is tolerating well with only minor bruising but no bleeding episodes. She has kept her outpatient follow-up visit for the stroke A. fib trial in which she was randomized to the medical arm without loop recorder implant. Update 09/29/2015 : She returns for follow-up after last visit 6 months ago. She continues to do well without any recurrent stroke or TIA symptoms. She states her blood pressure is well controlled and today it is 104/66. She remains on Plavix and does get minor bruising but no bleeding. She remains on Crestor but complains about joint aches and muscle aches. She had lipid profile checked a month ago it was satisfactory. She has not tried taking coenzyme Q 10. She has no new complaints today. She continues to participate in the stroke atrial fibrillation trial ROS:   14 system review of systems is positive for  blurred vision, joint pain, aching muscles only and all other systems negative PMH:  Past Medical History  Diagnosis Date  . Increased pressure in the eye   . Grave's disease   . Murmur   . GERD (gastroesophageal reflux disease)   . Morton's neuroma   . Hypothyroidism   . Anxiety   . Insomnia   . Diverticulosis   . Scoliosis   . Diverticulitis   . Barrett esophagus 2003  . Glaucoma   . H. pylori infection   . Hepatitis A     in her 41 from food    Social History:  Social History   Social History  . Marital  Status: Married    Spouse Name: N/A  . Number of Children: 1  . Years of Education: N/A   Occupational History  . 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 Smoking status: Former Smoker    Types: Cigarettes    Quit date: 03/12/1978  . Smokeless tobacco: Never Used     Comment: quit in 1978  . Alcohol Use: 0.6 oz/week    0 Standard drinks or equivalent, 1 Glasses of wine per week     Comment: SOCIALLY    . Drug Use: No  . Sexual Activity: Not on file   Other Topics Concern  . Not on file   Social History Narrative    Medications:   Current Outpatient Prescriptions on File Prior to Visit  Medication Sig Dispense Refill  . ARMOUR THYROID 60 MG tablet Take 1 tablet by mouth daily.     . clopidogrel (PLAVIX) 75 MG tablet Take 1 tablet (75 mg total) by mouth daily. Pt will be seeing PCP for future refills if they will take over the refills next week 30 tablet 0  . diazepam (VALIUM) 5 MG tablet Take 5 mg by mouth at bedtime as needed for anxiety (sleep).     . dorzolamide (TRUSOPT) 2 % ophthalmic solution Place 1 drop into both eyes daily.    Marland Kitchen latanoprost (XALATAN) 0.005 % ophthalmic solution Place 1 drop into both eyes at bedtime.    . Multiple Vitamin (MULTIVITAMIN) tablet Take 1 tablet by mouth daily.    . rosuvastatin (CRESTOR) 5 MG tablet   3   No current facility-administered medications on file prior to visit.    Allergies:   Allergies  Allergen Reactions  . Neuromuscular Blocking Agents Other (See Comments)    Multiple symptoms, SOB, fatigue, weakness    Physical Exam General: frail petite pleasant elderly Caucasian lady, seated, in no evident distress Head: head normocephalic and atraumatic.  Neck: supple with no carotid or supraclavicular bruits Cardiovascular: regular rate and rhythm, no murmurs Musculoskeletal: no deformity Skin:  no rash/petichiae Vascular:  Normal pulses all extremities Filed Vitals:   09/29/15 1134  BP: 104/66  Pulse: 72   Neurologic Exam Mental Status: Awake and fully alert. Oriented to place and time. Recent and remote memory intact. Attention span, concentration and fund of knowledge appropriate. Mood and affect appropriate.  Cranial Nerves: Fundoscopic exam not done. Pupils equal, briskly reactive to light. Extraocular movements full without nystagmus. Visual fields full to confrontation. Hearing intact. Facial sensation intact. Face,  tongue, palate moves normally and symmetrically.  Motor: Normal bulk and tone. Normal strength in all tested extremity muscles. Sensory.: intact to touch ,pinprick .position and vibratory sensation.  Coordination: Rapid alternating movements normal in all extremities. Finger-to-nose and heel-to-shin performed accurately bilaterally. Gait and Station: Arises from chair without difficulty. Stance is normal. Gait demonstrates normal stride length and balance . Able to heel, toe and tandem walk without difficulty.  Reflexes: 1+ and symmetric. Toes downgoing.      ASSESSMENT: 39 year Caucasian lady with right occipital infarcts in October 2016 secondary to right vertebral artery occlusion. Vascular risk factors of hyperlipidemia, cerebrovascular disease. Patient is particpating in the stroke atrial fibrillation trial and randomized to the standard of care arm ( no loop recorder implant)    PLAN: I had a long d/w patient about her remote stroke, risk for recurrent stroke/TIAs, personally independently reviewed imaging studies and stroke  evaluation results and answered questions.Continue Plavix   for secondary stroke prevention and maintain strict control of hypertension with blood pressure goal below 130/90, diabetes with hemoglobin A1c goal below 6.5% and lipids with LDL cholesterol goal below 70 mg/dL. I also advised the patient to eat a healthy diet with plenty of whole grains, cereals, fruits and vegetables, exercise regularly and maintain ideal body weight. I recommend she start taking coenzyme Q 10 200 mg daily to help with her statin myalgias.. Greater than 50% time during this 25 minute visit was spent on counseling and coordination of care about stroke risk and prevention Followup in the future with me in  1 year or call earlier if necessary Jamie Contras, MD Note: This document was prepared with digital dictation and possible smart phrase technology. Any transcriptional errors that result from  this process are unintentional

## 2015-12-12 ENCOUNTER — Telehealth: Payer: Self-pay | Admitting: Neurology

## 2015-12-12 NOTE — Telephone Encounter (Signed)
Dr.Sethi you saw pt in JUly 2017. Do you want to order any dopplers of ct angio head neck etc. Let me know so I can call the patient back thanks

## 2015-12-12 NOTE — Telephone Encounter (Signed)
Rn spoke with patient about hearing heartbeat in ears. Pt saw her PA at her PCP and did not seem concern.  They told her to call our office for advice. Rn explain that Dr.Sethi will not return to [facility] till Wednesday. Also Rn explain he will be working at the hospital this week and will not be in the office till 12/19/2015. Rn stated a message will be sent to Dr. Leonie Man if he wants to evaluate it further. Pt verbalized understanding.

## 2015-12-12 NOTE — Telephone Encounter (Signed)
Pt called in stating a for the last couple of months she is hearing her heart beat in her ears. She said she saw her PA and they did not seemed concerned but to call the office. Please call [phone]

## 2015-12-14 NOTE — Telephone Encounter (Signed)
I doubt her symptoms are cerebrovascular in nature and do not feel repeat CT angio is necessary. If symptoms persist or worsen she may call to schedule an earlier appointment with NP or me

## 2015-12-14 NOTE — Telephone Encounter (Signed)
RN call patient about Dr. Leonie Man advice. Rn stated per Dr. Leonie Man note he doubts her symptoms are cerebrovascular in nature. He does not feel she needs a Ct angio at this time.Also if her symptoms increase or get worse to schedule an appt with him or NP. Rn offer to schedule pt with appt but she decline. Pt stated the heartbeat in her ears does not happen all the time. It may happen when she gets dizzy. Rn advise pt to call back to schedule if it gets worse or seek ED. Pt will call us for an earlier appt if it gets worse. Pt verbalized understanding.

## 2016-01-25 ENCOUNTER — Encounter: Payer: Self-pay | Admitting: *Deleted

## 2016-01-25 DIAGNOSIS — Z006 Encounter for examination for normal comparison and control in clinical research program: Secondary | ICD-10-CM

## 2016-01-25 NOTE — Progress Notes (Signed)
STROKE-AF research study 12 month visit completed. Patient has been stable and no changes in her health or medications. Next research required follow up is due no later than 18/MAY/2018.

## 2016-05-09 ENCOUNTER — Encounter: Payer: Self-pay | Admitting: Gastroenterology

## 2016-05-15 ENCOUNTER — Ambulatory Visit: Payer: Medicare Other | Admitting: Family Medicine

## 2016-05-24 ENCOUNTER — Ambulatory Visit: Payer: Medicare Other | Admitting: Family Medicine

## 2016-05-29 ENCOUNTER — Ambulatory Visit (INDEPENDENT_AMBULATORY_CARE_PROVIDER_SITE_OTHER): Payer: Medicare HMO | Admitting: Adult Health

## 2016-05-29 ENCOUNTER — Encounter: Payer: Self-pay | Admitting: Adult Health

## 2016-05-29 VITALS — BP 105/73 | HR 83 | Ht 62.0 in | Wt 126.0 lb

## 2016-05-29 DIAGNOSIS — K219 Gastro-esophageal reflux disease without esophagitis: Secondary | ICD-10-CM | POA: Diagnosis not present

## 2016-05-29 DIAGNOSIS — Z0000 Encounter for general adult medical examination without abnormal findings: Secondary | ICD-10-CM | POA: Diagnosis not present

## 2016-05-29 DIAGNOSIS — E039 Hypothyroidism, unspecified: Secondary | ICD-10-CM | POA: Diagnosis not present

## 2016-05-29 DIAGNOSIS — I639 Cerebral infarction, unspecified: Secondary | ICD-10-CM

## 2016-05-29 MED ORDER — CLOPIDOGREL BISULFATE 75 MG PO TABS
75.0000 mg | ORAL_TABLET | Freq: Every day | ORAL | 6 refills | Status: DC
Start: 2016-05-29 — End: 2016-10-09

## 2016-05-29 NOTE — Assessment & Plan Note (Signed)
Continue armour thyroid 15mg  daily. Endocrinologist manges this condition.

## 2016-05-29 NOTE — Assessment & Plan Note (Signed)
Avoid spicy/acidic foods.

## 2016-05-29 NOTE — Assessment & Plan Note (Signed)
>>  ASSESSMENT AND PLAN FOR HYPOTHYROIDISM WRITTEN ON 05/29/2016  2:21 PM BY BESS, KATY D, NP  Continue armour thyroid '15mg'$  daily. Endocrinologist manges this condition.

## 2016-05-29 NOTE — Progress Notes (Signed)
Subjective:    Patient ID: [name], female    DOB: 11-18-1945, 71 y.o.   MRN: [id number]  HPI:  [name] is here to establish as a new pt.  She is a very pleasant, healthy 71 year old woman. PMH: Hypothyroidism, GERD, intermittent insomnia, CVA (no residual deficits), chronic pain (right hamstring and right tarsal tunnel). Various conditions well managed on medication and healthy eating and yoga practice (exception-chronic pain, however she wants to discuss this at a later encounter).  She is followed by Neurologist annually.  She recently retired from a career as Therapist, music.     Patient Care Team    Relationship Specialty Notifications Start End  Odella Aquas, NP PCP - General Family Medicine  05/29/16   Almedia Balls, MD Consulting Physician Orthopedic Surgery  05/29/16   Garvin Fila, MD Consulting Physician Neurology  05/29/16   Milus Banister, MD Attending Physician Gastroenterology  05/29/16   Ronnell Freshwater, MD Referring Physician Ophthalmology  05/29/16     Patient Active Problem List   Diagnosis Date Noted  . Health care maintenance 05/29/2016  . Cerebral infarction due to thrombosis of right vertebral artery ([place])   . Cerebral infarction ([place]) 12/26/2014  . Anxiety 12/26/2014  . Hypothyroidism (acquired) 12/26/2014  . GERD without esophagitis 12/26/2014  . Glaucoma 12/26/2014  . Allergic rhinitis 12/26/2014  . Cerebrovascular accident (CVA) ([place]) 12/26/2014  . Cerebral infarction due to embolism of right vertebral artery ([place])   . Visual field loss   . Occlusion and stenosis of vertebral artery      Past Medical History:  Diagnosis Date  . Anxiety   . Barrett esophagus 2003  . Diverticulitis   . Diverticulosis   . GERD (gastroesophageal reflux disease)   . Glaucoma   . Grave's disease   . H. pylori infection   . Hepatitis A    in her 71 from food  . Hypothyroidism   . Increased pressure in the eye   . Insomnia   . Morton's neuroma   .  Murmur   . Scoliosis   . Stroke [facility]) 12/2014     Past Surgical History:  Procedure Laterality Date  . APPENDECTOMY  1958  . CERVICAL LAMINECTOMY    . EYE SURGERY     due to graves disease  . FEMUR SURGERY Right    benign bone growth  . FOOT NEUROMA SURGERY Right   . LAPAROSCOPIC ASSISTED VAGINAL HYSTERECTOMY    . LAPAROSCOPIC SIGMOID COLECTOMY  05/13/06   Dr Zella Richer  . [place] SURGERY  2009  . TONSILLECTOMY    . TUBAL LIGATION       Family History  Problem Relation Age of Onset  . Other Father 13    SEPSIS  . Hypertension Father   . Stroke Father 64    CVA  . Heart failure Mother 62  . Heart murmur Mother   . Heart attack Sister 40  . Diabetes Brother   . Breast cancer Sister   . Stroke Sister   . Cirrhosis Brother   . Hypertension Sister     x 2  . Diabetes Sister   . Heart murmur Daughter      History  Drug Use No     History  Alcohol Use  . 0.6 oz/week  . 1 Glasses of wine per week    Comment: SOCIALLY     History  Smoking Status  . Former Smoker  .  Packs/day: 0.25  . Types: Cigarettes  . Quit date: 03/12/1978  Smokeless Tobacco  . Never Used    Comment: quit in 1978     Outpatient Encounter Prescriptions as of 05/29/2016  Medication Sig Note  . acetaminophen (TYLENOL) 325 MG tablet Take 650 mg by mouth every 6 (six) hours as needed.   Francia Greaves THYROID 60 MG tablet Take 1 tablet by mouth daily.  12/26/2014: Take with 15 mg on Saturday and Sunday.  . Calcium Carb-Cholecalciferol (CALCIUM + D3) 600-200 MG-UNIT TABS Take by mouth.   . clopidogrel (PLAVIX) 75 MG tablet Take 1 tablet (75 mg total) by mouth daily. Pt will be seeing PCP for future refills if they will take over the refills next week   . Coenzyme Q10 (COQ10 PO) Take by mouth.   . diazepam (VALIUM) 5 MG tablet Take 5 mg by mouth at bedtime as needed for anxiety (sleep).    . dorzolamide (TRUSOPT) 2 % ophthalmic solution Place 1 drop into both eyes daily.   Marland Kitchen latanoprost  (XALATAN) 0.005 % ophthalmic solution Place 1 drop into both eyes at bedtime.   . Multiple Vitamin (MULTIVITAMIN) tablet Take 1 tablet by mouth daily.   . rosuvastatin (CRESTOR) 5 MG tablet  03/15/2015: Received from: External Pharmacy  . thyroid (ARMOUR THYROID) 15 MG tablet Take 15 mg by mouth 2 (two) times a week.   . [DISCONTINUED] clopidogrel (PLAVIX) 75 MG tablet Take 1 tablet (75 mg total) by mouth daily. Pt will be seeing PCP for future refills if they will take over the refills next week    No facility-administered encounter medications on file as of 05/29/2016.     Allergies: Neuromuscular blocking agents  Body mass index is 23.05 kg/m.  Blood pressure 105/73, pulse 83, height 5\' 2"  (1.575 m), weight 126 lb 0.6 oz (57.2 kg), SpO2 99 %.     Review of Systems  Constitutional: Negative for activity change, appetite change, chills, diaphoresis, fatigue, fever and unexpected weight change.  HENT: Negative for congestion.   Eyes: Positive for visual disturbance.       Followed by Optometrist.  Respiratory: Negative for cough, chest tightness, shortness of breath, wheezing and stridor.   Cardiovascular: Negative for chest pain, palpitations and leg swelling.  Gastrointestinal: Negative for abdominal distention, abdominal pain, blood in stool, constipation, diarrhea, nausea and vomiting.  Endocrine: Negative for cold intolerance, heat intolerance, polydipsia, polyphagia and polyuria.  Genitourinary: Negative for difficulty urinating and flank pain.  Musculoskeletal: Positive for arthralgias, joint swelling and myalgias. Negative for back pain, gait problem, neck pain and neck stiffness.  Skin: Negative for color change, pallor, rash and wound.  Neurological: Negative for dizziness, tremors, weakness, light-headedness and headaches.  Hematological: Does not bruise/bleed easily.  Psychiatric/Behavioral: Positive for sleep disturbance. Negative for agitation, self-injury and suicidal  ideas. The patient is not nervous/anxious and is not hyperactive.        Objective:   Physical Exam  Constitutional: She is oriented to person, place, and time. She appears well-developed and well-nourished. No distress.  HENT:  Head: Normocephalic and atraumatic.  Right Ear: Hearing, tympanic membrane and external ear normal.  Left Ear: Hearing, tympanic membrane and external ear normal.  Nose: Nose normal.  Mouth/Throat: Uvula is midline.  Eyes: Conjunctivae are normal. Pupils are equal, round, and reactive to light.  Neck: Normal range of motion. Neck supple.  Cardiovascular: Normal rate, regular rhythm and intact distal pulses.   No murmur heard. Pulmonary/Chest: Effort normal and  breath sounds normal.  Abdominal: Soft. Bowel sounds are normal. She exhibits no distension and no mass. There is no tenderness. There is no rebound and no guarding.  Musculoskeletal: She exhibits tenderness.       Right ankle: Tenderness.       Right upper leg: She exhibits tenderness.  Lymphadenopathy:    She has no cervical adenopathy.  Neurological: She is alert and oriented to person, place, and time. Coordination normal.  Skin: Skin is warm and dry. No rash noted. She is not diaphoretic. No erythema. No pallor.  Psychiatric: She has a normal mood and affect. Her behavior is normal. Judgment and thought content normal.  Nursing note and vitals reviewed.         Assessment & Plan:   1. GERD without esophagitis   2. Hypothyroidism (acquired)   3. Cerebrovascular accident (CVA), unspecified mechanism ([place])   4. Health care maintenance     Cerebrovascular accident (CVA) ([place]) Continue daily Clopidogrel 75 mg. Next Neurologist visit schedule for summer 2018.  GERD without esophagitis Avoid spicy/acidic foods.   Hypothyroidism (acquired) Continue armour thyroid 15mg  daily. Endocrinologist manges this condition.    FOLLOW-UP:  Return in about 6 months (around 11/29/2016) for Regular  Follow Up.

## 2016-05-29 NOTE — Patient Instructions (Signed)
Heart-Healthy Eating Plan Many factors influence your heart health, including eating and exercise habits. Heart (coronary) risk increases with abnormal blood fat (lipid) levels. Heart-healthy meal planning includes limiting unhealthy fats, increasing healthy fats, and making other small dietary changes. This includes maintaining a healthy body weight to help keep lipid levels within a normal range. What is my plan? Your health care provider recommends that you:  Get no more than _________% of the total calories in your daily diet from fat.  Limit your intake of saturated fat to less than _________% of your total calories each day.  Limit the amount of cholesterol in your diet to less than _________ mg per day. What types of fat should I choose?  Choose healthy fats more often. Choose monounsaturated and polyunsaturated fats, such as olive oil and canola oil, flaxseeds, walnuts, almonds, and seeds.  Eat more omega-3 fats. Good choices include salmon, mackerel, sardines, tuna, flaxseed oil, and ground flaxseeds. Aim to eat fish at least two times each week.  Limit saturated fats. Saturated fats are primarily found in animal products, such as meats, butter, and cream. Plant sources of saturated fats include palm oil, palm kernel oil, and coconut oil.  Avoid foods with partially hydrogenated oils in them. These contain trans fats. Examples of foods that contain trans fats are stick margarine, some tub margarines, cookies, crackers, and other baked goods. What general guidelines do I need to follow?  Check food labels carefully to identify foods with trans fats or high amounts of saturated fat.  Fill one half of your plate with vegetables and green salads. Eat 4-5 servings of vegetables per day. A serving of vegetables equals 1 cup of raw leafy vegetables,  cup of raw or cooked cut-up vegetables, or  cup of vegetable juice.  Fill one fourth of your plate with whole grains. Look for the word  "whole" as the first word in the ingredient list.  Fill one fourth of your plate with lean protein foods.  Eat 4-5 servings of fruit per day. A serving of fruit equals one medium whole fruit,  cup of dried fruit,  cup of fresh, frozen, or canned fruit, or  cup of 100% fruit juice.  Eat more foods that contain soluble fiber. Examples of foods that contain this type of fiber are apples, broccoli, carrots, beans, peas, and barley. Aim to get 20-30 g of fiber per day.  Eat more home-cooked food and less restaurant, buffet, and fast food.  Limit or avoid alcohol.  Limit foods that are high in starch and sugar.  Avoid fried foods.  Cook foods by using methods other than frying. Baking, boiling, grilling, and broiling are all great options. Other fat-reducing suggestions include:  Removing the skin from poultry.  Removing all visible fats from meats.  Skimming the fat off of stews, soups, and gravies before serving them.  Steaming vegetables in water or broth.  Lose weight if you are overweight. Losing just 5-10% of your initial body weight can help your overall health and prevent diseases such as diabetes and heart disease.  Increase your consumption of nuts, legumes, and seeds to 4-5 servings per week. One serving of dried beans or legumes equals  cup after being cooked, one serving of nuts equals 1 ounces, and one serving of seeds equals  ounce or 1 tablespoon.  You may need to monitor your salt (sodium) intake, especially if you have high blood pressure. Talk with your health care provider or dietitian to get more  information about reducing sodium. What foods can I eat? Grains   Breads, including [place], white, pita, wheat, raisin, rye, oatmeal, and [place]. Tortillas that are neither fried nor made with lard or trans fat. Low-fat rolls, including hotdog and hamburger buns and English muffins. Biscuits. Muffins. Waffles. Pancakes. Light popcorn. Whole-grain cereals. Flatbread.  Melba toast. Pretzels. Breadsticks. Rusks. Low-fat snacks and crackers, including oyster, saltine, matzo, graham, animal, and rye. Rice and pasta, including [place] rice and those that are made with whole wheat. Vegetables  All vegetables. Fruits  All fruits, but limit coconut. Meats and Other Protein Sources  Lean, well-trimmed beef, veal, pork, and lamb. Chicken and [place] without skin. All fish and shellfish. Wild duck, rabbit, pheasant, and venison. Egg whites or low-cholesterol egg substitutes. Dried beans, peas, lentils, and tofu.Seeds and most nuts. Dairy  Low-fat or nonfat cheeses, including ricotta, string, and mozzarella. Skim or 1% milk that is liquid, powdered, or evaporated. Buttermilk that is made with low-fat milk. Nonfat or low-fat yogurt. Beverages  Mineral water. Diet carbonated beverages. Sweets and Desserts  Sherbets and fruit ices. Honey, jam, marmalade, jelly, and syrups. Meringues and gelatins. Pure sugar candy, such as hard candy, jelly beans, gumdrops, mints, marshmallows, and small amounts of dark chocolate. [employer] Eat all sweets and desserts in moderation. Fats and Oils  Nonhydrogenated (trans-free) margarines. Vegetable oils, including soybean, sesame, sunflower, olive, peanut, safflower, corn, canola, and cottonseed. Salad dressings or mayonnaise that are made with a vegetable oil. Limit added fats and oils that you use for cooking, baking, salads, and as spreads. Other  Cocoa powder. Coffee and tea. All seasonings and condiments. The items listed above may not be a complete list of recommended foods or beverages. Contact your dietitian for more options.  What foods are not recommended? Grains  Breads that are made with saturated or trans fats, oils, or whole milk. Croissants. Butter rolls. Cheese breads. Sweet rolls. Donuts. Buttered popcorn. Chow mein noodles. High-fat crackers, such as cheese or butter crackers. Meats and Other Protein Sources  Fatty  meats, such as hotdogs, short ribs, sausage, spareribs, bacon, ribeye roast or steak, and mutton. High-fat deli meats, such as salami and bologna. Caviar. Domestic duck and goose. Organ meats, such as kidney, liver, sweetbreads, brains, gizzard, chitterlings, and heart. Dairy  Cream, sour cream, cream cheese, and creamed cottage cheese. Whole milk cheeses, including blue (bleu), Monterey Jack, [place], [place], American, [place], Swiss, [place], [place], and [place]. Whole or 2% milk that is liquid, evaporated, or condensed. Whole buttermilk. Cream sauce or high-fat cheese sauce. Yogurt that is made from whole milk. Beverages  Regular sodas and drinks with added sugar. Sweets and Desserts  Frosting. Pudding. Cookies. Cakes other than angel food cake. Candy that has milk chocolate or white chocolate, hydrogenated fat, butter, coconut, or unknown ingredients. Buttered syrups. Full-fat ice cream or ice cream drinks. Fats and Oils  Gravy that has suet, meat fat, or shortening. Cocoa butter, hydrogenated oils, palm oil, coconut oil, palm kernel oil. These can often be found in baked products, candy, fried foods, nondairy creamers, and whipped toppings. Solid fats and shortenings, including bacon fat, salt pork, lard, and butter. Nondairy cream substitutes, such as coffee creamers and sour cream substitutes. Salad dressings that are made of unknown oils, cheese, or sour cream. The items listed above may not be a complete list of foods and beverages to avoid. Contact your dietitian for more information.  This information is not intended to replace advice given to you by  your health care provider. Make sure you discuss any questions you have with your health care provider. Document Released: 12/06/2007 Document Revised: 09/16/2015 Document Reviewed: 08/20/2013 Elsevier Interactive Patient Education  2017 [place] all medications as directed. Continue Yoga practice. Please return in July 2018  for fasting labs and regular follow-up in 6 months, sooner if needed.

## 2016-05-29 NOTE — Assessment & Plan Note (Signed)
Continue daily Clopidogrel 75 mg. Next Neurologist visit schedule for summer 2018.

## 2016-06-13 ENCOUNTER — Other Ambulatory Visit: Payer: Self-pay | Admitting: Adult Health

## 2016-06-13 ENCOUNTER — Telehealth: Payer: Self-pay | Admitting: Adult Health

## 2016-06-13 DIAGNOSIS — H401122 Primary open-angle glaucoma, left eye, moderate stage: Secondary | ICD-10-CM | POA: Diagnosis not present

## 2016-06-13 DIAGNOSIS — E785 Hyperlipidemia, unspecified: Secondary | ICD-10-CM

## 2016-06-13 MED ORDER — ROSUVASTATIN CALCIUM 5 MG PO TABS: 5.0000 mg | ORAL_TABLET | Freq: Every day | ORAL | 3 refills | Status: DC

## 2016-06-13 NOTE — Telephone Encounter (Signed)
Patient is requesting a 90 supply refill of her rosuvastatin 5mg  sent to [facility] on [address]

## 2016-06-13 NOTE — Telephone Encounter (Signed)
Refilled. Thanks! Valetta Fuller

## 2016-06-13 NOTE — Telephone Encounter (Signed)
We have not prescribed these medications for the patient previously.  Please review and refill if appropriate.  T. Nelson, CMA  

## 2016-06-14 ENCOUNTER — Telehealth: Payer: Self-pay | Admitting: Gastroenterology

## 2016-06-14 NOTE — Telephone Encounter (Signed)
Dr. Silverio Decamp please see notes below. Please advise on whether you will accept this patient and if you would like to see her in clinic first. Thank you.

## 2016-06-14 NOTE — Telephone Encounter (Signed)
Spoke to patient to let her know that Dr. Silverio Decamp will be glad to accept her as a patient, but she does want her to have an office visit first. Patient wanted first available office visit, she is scheduled for 4/12 at 3:00 with [place].

## 2016-06-14 NOTE — Telephone Encounter (Signed)
I think given some of her comorbidities I had suggested a clinic visit (on plavix, history of stroke, hadn't been seen in a while). I have never met her. If she wishes to see Dr. Silverio Decamp that's fine with me, if okay with her. Defer to her whether or not she wants to direct book for colonoscopy screening or see her in clinic. Thanks

## 2016-06-14 NOTE — Telephone Encounter (Signed)
Please advise if you are okay with patient switching to Dr. Silverio Decamp, if you are, I will send a message to her to see if she will accept this patient.

## 2016-06-14 NOTE — Telephone Encounter (Signed)
Ok, given her history of stroke and currently she is on plavix, will need to schedule an office visit either with me or [facility] prior to colonoscopy. Thanks

## 2016-06-19 DIAGNOSIS — H401122 Primary open-angle glaucoma, left eye, moderate stage: Secondary | ICD-10-CM | POA: Diagnosis not present

## 2016-06-20 DIAGNOSIS — H2513 Age-related nuclear cataract, bilateral: Secondary | ICD-10-CM | POA: Diagnosis not present

## 2016-06-21 ENCOUNTER — Encounter: Payer: Self-pay | Admitting: Physician Assistant

## 2016-06-21 ENCOUNTER — Ambulatory Visit (INDEPENDENT_AMBULATORY_CARE_PROVIDER_SITE_OTHER): Payer: Medicare HMO | Admitting: Physician Assistant

## 2016-06-21 ENCOUNTER — Telehealth: Payer: Self-pay | Admitting: Emergency Medicine

## 2016-06-21 VITALS — BP 106/72 | HR 80 | Ht 62.25 in | Wt 127.2 lb

## 2016-06-21 DIAGNOSIS — Z7901 Long term (current) use of anticoagulants: Secondary | ICD-10-CM

## 2016-06-21 DIAGNOSIS — Z1211 Encounter for screening for malignant neoplasm of colon: Secondary | ICD-10-CM

## 2016-06-21 MED ORDER — NA SULFATE-K SULFATE-MG SULF 17.5-3.13-1.6 GM/177ML PO SOLN: 1.0000 | ORAL | 0 refills | Status: DC

## 2016-06-21 NOTE — Patient Instructions (Signed)

## 2016-06-21 NOTE — Telephone Encounter (Signed)
  [name] [birth year] [id number]  Dear Dr. Leonie Man:  We have scheduled the above named patient for a(n) colonoscopy procedure. Our records show that (s)he is on anticoagulation therapy.  Please advise as to whether the patient may come of their therapy of Plavix 5 days prior to their procedure which is scheduled for 08-10-16.  Please route your response to Tinnie Gens, CMA or fax response to [phone].   Sincerely,  Tinnie Gens, [facility]

## 2016-06-21 NOTE — Progress Notes (Signed)
Subjective:    Patient ID: [name], female    DOB: 08-Oct-1945, 71 y.o.   MRN: [id number]  HPI Shirin is a very nice 71 year old white female known previously to Dr. [name] with history of diverticulosis. She had an episode of perforated diverticulitis in 2008 and underwent sigmoid colectomy. She also has prior history of Barrett's esophagus. She has GERD , hypothyroidism, and history of a CVA in 2016. She has been followed by Dr. Leonie Man and maintained on Plavix. She comes in today to discuss recall colonoscopy.   EGD had been done in January 2016 per Dr. Olevia Perches she was noted to have duodenitis, positive for H. pylori and treated. Biopsies of the esophagus showed no dysplasia and no evidence of Barrett's. Last colonoscopy April 2008 which was done 6 weeks post-segmental colectomy. This was a negative exam with no polyps. Patient has no history of polyps or family history of colon cancer. She currently has no GI symptoms, specifically denies heartburn indigestion dysphagia abdominal pain and changes in bowel habits melena or hematochezia.  Review of Systems Pertinent positive and negative review of systems were noted in the above HPI section.  All other review of systems was otherwise negative.  Outpatient Encounter Prescriptions as of 06/21/2016  Medication Sig  . acetaminophen (TYLENOL) 325 MG tablet Take 650 mg by mouth as needed.   Francia Greaves THYROID 60 MG tablet Take 1 tablet by mouth daily.   . Calcium Carb-Cholecalciferol (CALCIUM + D3) 600-200 MG-UNIT TABS Take by mouth.  . clopidogrel (PLAVIX) 75 MG tablet Take 1 tablet (75 mg total) by mouth daily. Pt will be seeing PCP for future refills if they will take over the refills next week  . diazepam (VALIUM) 5 MG tablet Take 5 mg by mouth at bedtime as needed for anxiety (sleep).   . dorzolamide (TRUSOPT) 2 % ophthalmic solution Place 1 drop into both eyes daily.  Marland Kitchen latanoprost (XALATAN) 0.005 % ophthalmic solution Place 1 drop into  both eyes at bedtime.  . Multiple Vitamin (MULTIVITAMIN) tablet Take 1 tablet by mouth daily.  . rosuvastatin (CRESTOR) 5 MG tablet Take 1 tablet (5 mg total) by mouth at bedtime.  Marland Kitchen thyroid (ARMOUR THYROID) 15 MG tablet Take 15 mg by mouth 2 (two) times a week.  . Na Sulfate-K Sulfate-Mg Sulf 17.5-3.13-1.6 GM/180ML SOLN Take 1 kit by mouth as directed.  . [DISCONTINUED] Coenzyme Q10 (COQ10 PO) Take by mouth.   No facility-administered encounter medications on file as of 06/21/2016.    Allergies  Allergen Reactions  . Neuromuscular Blocking Agents Other (See Comments)    Multiple symptoms, SOB, fatigue, weakness   Patient Active Problem List   Diagnosis Date Noted  . Health care maintenance 05/29/2016  . Cerebral infarction due to thrombosis of right vertebral artery ([place])   . Cerebral infarction ([place]) 12/26/2014  . Anxiety 12/26/2014  . Hypothyroidism (acquired) 12/26/2014  . GERD without esophagitis 12/26/2014  . Glaucoma 12/26/2014  . Allergic rhinitis 12/26/2014  . Cerebrovascular accident (CVA) ([place]) 12/26/2014  . Cerebral infarction due to embolism of right vertebral artery ([place])   . Visual field loss   . Occlusion and stenosis of vertebral artery    Social History   Social History  . Marital status: Married    Spouse name: N/A  . Number of children: 1  . Years of education: N/A   Occupational History  . 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 Smoking status: Former Smoker    Packs/day: 0.25    Types: Cigarettes    Quit date: 03/12/1978  . Smokeless tobacco: Never Used     Comment: quit in 1978  . Alcohol use 0.6 oz/week    1 Glasses of wine per week     Comment: SOCIALLY  . Drug use: No  . Sexual activity: Not Currently    Birth control/ protection: None   Other Topics Concern  . Not on file   Social History Narrative  . No narrative on file    Ms. Depriest's family history includes Breast cancer in her  sister; Cirrhosis in her brother; Diabetes in her brother and sister; Heart attack (age of onset: 47) in her sister; Heart failure (age of onset: 15) in her mother; Heart murmur in her daughter and mother; Hypertension in her father and sister; Other (age of onset: 62) in her father; Stroke in her sister; Stroke (age of onset: 61) in her father.      Objective:    Vitals:   06/21/16 1504  BP: 106/72  Pulse: 80    Physical Exam well-developed older white female in no acute distress, very pleasant blood pressure 106/72 pulse 80, height 5 foot 2, weight 127, BMI 23. HEENT; nontraumatic normocephalic EOMI PERRLA sclera anicteric, Cardiovascular; regular rate and rhythm with S1-S2 no murmur rub or gallop, Pulmonary; clear bilaterally, Abdomen ;soft, nontender nondistended bowel sounds are active no palpable mass or hepatosplenomegaly, Rectal; exam not done, Extremities; no clubbing cyanosis or edema skin warm and dry, Neuropsych; mood and affect appropriate, no neurologic deficits  appreciated       Assessment & Plan:   #59 71 year old female due for 10 year interval follow-up colonoscopy for colon cancer surveillance, no history of colon polyps. #2 chronic antiplatelet therapy-on Plavix #3 history of CVA 2016 #4 GERD #5 hypothyroidism #6 prior history of Barrett's esophagus-last EGD January 2016 biopsy showed no evidence of Barrett's #7 positive H. pylori duodenitis treated 2016 #8 history of perforated diverticulitis 2008 status post segmental colectomy  Plan; Long discussion with patient regarding follow-up colonoscopy versus Cologard tool DNA testing. She would prefer to have another colonoscopy. We discussed small but real risk of thrombotic event off Plavix. She voices understanding. She may take baby aspirin one daily, during the time she is off Plavix. We will communicate with her neurologist Dr. Leonie Man, to assure that holding Plavix for 5 days prior to colonoscopy, and substituting  baby aspirin is  acceptable for this patient. Patient will be scheduled for colonoscopy with Dr. Silverio Decamp in June 2018. Patient has an upcoming cataract surgery and would like to get through that first.  Alfredia Ferguson PA-C 06/21/2016   Cc: Odella Aquas, NP

## 2016-06-22 NOTE — Telephone Encounter (Signed)
I have reviewed the patient's electronic medical records she last had a stroke in October 2016 and no recurrent symptoms since then. The patient may hold Plavix for 5 days prior to scheduled procedure and resume it after the procedure when safe with a small but acceptable periprocedural risk of TIA/stroke if patient is willing.

## 2016-06-25 NOTE — Progress Notes (Signed)
Reviewed and agree with documentation and assessment and plan. K. Veena Xitlalli Newhard , MD   

## 2016-06-25 NOTE — Telephone Encounter (Signed)
Patient informed and verbalized understanding

## 2016-06-27 ENCOUNTER — Encounter: Payer: Self-pay | Admitting: *Deleted

## 2016-06-29 NOTE — Discharge Instructions (Signed)
Cataract Surgery, Care After °Refer to this sheet in the next few weeks. These instructions provide you with information about caring for yourself after your procedure. Your health care provider may also give you more specific instructions. Your treatment has been planned according to current medical practices, but problems sometimes occur. Call your health care provider if you have any problems or questions after your procedure. °What can I expect after the procedure? °After the procedure, it is common to have: °· Itching. °· Discomfort. °· Fluid discharge. °· Sensitivity to light and to touch. °· Bruising. °Follow these instructions at home: °Eye Care  °· Check your eye every day for signs of infection. Watch for: °¨ Redness, swelling, or pain. °¨ Fluid, blood, or pus. °¨ Warmth. °¨ Bad smell. °Activity  °· Avoid strenuous activities, such as playing contact sports, for as long as told by your health care provider. °· Do not drive or operate heavy machinery until your health care provider approves. °· Do not bend or lift heavy objects . Bending increases pressure in the eye. You can walk, climb stairs, and do light household chores. °· Ask your health care provider when you can return to work. If you work in a dusty environment, you may be advised to wear protective eyewear for a period of time. °General instructions  °· Take or apply over-the-counter and prescription medicines only as told by your health care provider. This includes eye drops. °· Do not touch or rub your eyes. °· If you were given a protective shield, wear it as told by your health care provider. If you were not given a protective shield, wear sunglasses as told by your health care provider to protect your eyes. °· Keep the area around your eye clean and dry. Avoid swimming or allowing water to hit you directly in the face while showering until told by your health care provider. Keep soap and shampoo out of your eyes. °· Do not put a contact lens  into the affected eye or eyes until your health care provider approves. °· Keep all follow-up visits as told by your health care provider. This is important. °Contact a health care provider if: ° °· You have increased bruising around your eye. °· You have pain that is not helped with medicine. °· You have a fever. °· You have redness, swelling, or pain in your eye. °· You have fluid, blood, or pus coming from your incision. °· Your vision gets worse. °Get help right away if: °· You have sudden vision loss. °This information is not intended to replace advice given to you by your health care provider. Make sure you discuss any questions you have with your health care provider. °Document Released: 09/15/2004 Document Revised: 07/07/2015 Document Reviewed: 01/06/2015 °Elsevier Interactive Patient Education © 2017 Elsevier Inc. ° ° ° ° °General Anesthesia, Adult, Care After °These instructions provide you with information about caring for yourself after your procedure. Your health care provider may also give you more specific instructions. Your treatment has been planned according to current medical practices, but problems sometimes occur. Call your health care provider if you have any problems or questions after your procedure. °What can I expect after the procedure? °After the procedure, it is common to have: °· Vomiting. °· A sore throat. °· Mental slowness. °It is common to feel: °· Nauseous. °· Cold or shivery. °· Sleepy. °· Tired. °· Sore or achy, even in parts of your body where you did not have surgery. °Follow these instructions at   home: °For at least 24 hours after the procedure:  °· Do not: °¨ Participate in activities where you could fall or become injured. °¨ Drive. °¨ Use heavy machinery. °¨ Drink alcohol. °¨ Take sleeping pills or medicines that cause drowsiness. °¨ Make important decisions or sign legal documents. °¨ Take care of children on your own. °· Rest. °Eating and drinking  °· If you vomit, drink  water, juice, or soup when you can drink without vomiting. °· Drink enough fluid to keep your urine clear or pale yellow. °· Make sure you have little or no nausea before eating solid foods. °· Follow the diet recommended by your health care provider. °General instructions  °· Have a responsible adult stay with you until you are awake and alert. °· Return to your normal activities as told by your health care provider. Ask your health care provider what activities are safe for you. °· Take over-the-counter and prescription medicines only as told by your health care provider. °· If you smoke, do not smoke without supervision. °· Keep all follow-up visits as told by your health care provider. This is important. °Contact a health care provider if: °· You continue to have nausea or vomiting at home, and medicines are not helpful. °· You cannot drink fluids or start eating again. °· You cannot urinate after 8-12 hours. °· You develop a skin rash. °· You have fever. °· You have increasing redness at the site of your procedure. °Get help right away if: °· You have difficulty breathing. °· You have chest pain. °· You have unexpected bleeding. °· You feel that you are having a life-threatening or urgent problem. °This information is not intended to replace advice given to you by your health care provider. Make sure you discuss any questions you have with your health care provider. °Document Released: 06/04/2000 Document Revised: 08/01/2015 Document Reviewed: 02/10/2015 °Elsevier Interactive Patient Education © 2017 Elsevier Inc. ° °

## 2016-07-02 ENCOUNTER — Encounter
Admission: RE | Disposition: A | Discharge status: Home / Self Care | Payer: Self-pay | Source: Ambulatory Visit | Attending: Ophthalmology

## 2016-07-02 ENCOUNTER — Ambulatory Visit
Admission: RE | Admit: 2016-07-02 | Discharge: 2016-07-02 | Disposition: A | Discharge status: Home / Self Care | Payer: Medicare HMO | Source: Ambulatory Visit | Attending: Ophthalmology | Admitting: Ophthalmology

## 2016-07-02 ENCOUNTER — Ambulatory Visit: Payer: Medicare HMO | Admitting: Anesthesiology

## 2016-07-02 DIAGNOSIS — E039 Hypothyroidism, unspecified: Secondary | ICD-10-CM | POA: Diagnosis not present

## 2016-07-02 DIAGNOSIS — Z8673 Personal history of transient ischemic attack (TIA), and cerebral infarction without residual deficits: Secondary | ICD-10-CM | POA: Diagnosis not present

## 2016-07-02 DIAGNOSIS — K219 Gastro-esophageal reflux disease without esophagitis: Secondary | ICD-10-CM | POA: Insufficient documentation

## 2016-07-02 DIAGNOSIS — I739 Peripheral vascular disease, unspecified: Secondary | ICD-10-CM | POA: Insufficient documentation

## 2016-07-02 DIAGNOSIS — Z79899 Other long term (current) drug therapy: Secondary | ICD-10-CM | POA: Insufficient documentation

## 2016-07-02 DIAGNOSIS — H2513 Age-related nuclear cataract, bilateral: Secondary | ICD-10-CM | POA: Diagnosis not present

## 2016-07-02 DIAGNOSIS — Z7902 Long term (current) use of antithrombotics/antiplatelets: Secondary | ICD-10-CM | POA: Diagnosis not present

## 2016-07-02 DIAGNOSIS — E7800 Pure hypercholesterolemia, unspecified: Secondary | ICD-10-CM | POA: Insufficient documentation

## 2016-07-02 DIAGNOSIS — H2512 Age-related nuclear cataract, left eye: Secondary | ICD-10-CM | POA: Insufficient documentation

## 2016-07-02 DIAGNOSIS — Z87891 Personal history of nicotine dependence: Secondary | ICD-10-CM | POA: Insufficient documentation

## 2016-07-02 HISTORY — PX: CATARACT EXTRACTION W/PHACO: SHX586

## 2016-07-02 HISTORY — DX: Hyperlipidemia, unspecified: E78.5

## 2016-07-02 SURGERY — PHACOEMULSIFICATION, CATARACT, WITH IOL INSERTION
Anesthesia: Monitor Anesthesia Care | Laterality: Left | Wound class: Clean

## 2016-07-02 MED ORDER — FENTANYL CITRATE (PF) 100 MCG/2ML IJ SOLN
INTRAMUSCULAR | Status: DC | PRN
  Administered 2016-07-02: 50 ug via INTRAVENOUS

## 2016-07-02 MED ORDER — NA HYALUR & NA CHOND-NA HYALUR 0.4-0.35 ML IO KIT
PACK | INTRAOCULAR | Status: DC | PRN
  Administered 2016-07-02: 1 mL via INTRAOCULAR

## 2016-07-02 MED ORDER — MOXIFLOXACIN HCL 0.5 % OP SOLN
1.0000 [drp] | OPHTHALMIC | Status: DC | PRN
  Administered 2016-07-02 (×3): 1 [drp] via OPHTHALMIC

## 2016-07-02 MED ORDER — LACTATED RINGERS IV SOLN: 10.0000 mL/h | INTRAVENOUS | Status: DC

## 2016-07-02 MED ORDER — LIDOCAINE HCL (PF) 2 % IJ SOLN
INTRAMUSCULAR | Status: DC | PRN
  Administered 2016-07-02: 1 mL

## 2016-07-02 MED ORDER — MIDAZOLAM HCL 2 MG/2ML IJ SOLN
INTRAMUSCULAR | Status: DC | PRN
  Administered 2016-07-02: 2 mg via INTRAVENOUS

## 2016-07-02 MED ORDER — BRIMONIDINE TARTRATE-TIMOLOL 0.2-0.5 % OP SOLN
OPHTHALMIC | Status: DC | PRN
  Administered 2016-07-02: 1 [drp] via OPHTHALMIC

## 2016-07-02 MED ORDER — ARMC OPHTHALMIC DILATING DROPS
1.0000 "application " | OPHTHALMIC | Status: DC | PRN
  Administered 2016-07-02 (×3): 1 via OPHTHALMIC

## 2016-07-02 MED ORDER — CEFUROXIME OPHTHALMIC INJECTION 1 MG/0.1 ML
INJECTION | OPHTHALMIC | Status: DC | PRN
  Administered 2016-07-02: 0.1 mL via INTRACAMERAL

## 2016-07-02 SURGICAL SUPPLY — 25 items
APL FBRTP 3 NS LF CTTN WD (MISCELLANEOUS) ×1
APPLICATOR COTTON TIP 3IN (MISCELLANEOUS) ×2 IMPLANT
CANNULA ANT/CHMB 27G (MISCELLANEOUS) ×1 IMPLANT
CANNULA ANT/CHMB 27GA (MISCELLANEOUS) ×2 IMPLANT
DISSECTOR HYDRO NUCLEUS 50X22 (MISCELLANEOUS) ×2 IMPLANT
GLOVE BIO SURGEON STRL SZ7 (GLOVE) ×2 IMPLANT
GLOVE SURG LX 6.5 MICRO (GLOVE) ×1
GLOVE SURG LX STRL 6.5 MICRO (GLOVE) ×1 IMPLANT
GOWN STRL REUS W/ TWL LRG LVL3 (GOWN DISPOSABLE) ×2 IMPLANT
GOWN STRL REUS W/TWL LRG LVL3 (GOWN DISPOSABLE) ×4
LENS IOL ACRSF IQ ULTRA 19.0 (Intraocular Lens) IMPLANT
LENS IOL ACRYSOF IQ 19.0 (Intraocular Lens) ×2 IMPLANT
MARKER SKIN DUAL TIP RULER LAB (MISCELLANEOUS) ×2 IMPLANT
PACK CATARACT BRASINGTON (MISCELLANEOUS) ×2 IMPLANT
PACK EYE AFTER SURG (MISCELLANEOUS) ×2 IMPLANT
PACK OPTHALMIC (MISCELLANEOUS) ×2 IMPLANT
RING MALYGIN 7.0 (MISCELLANEOUS) ×1 IMPLANT
SUT ETHILON 10-0 CS-B-6CS-B-6 (SUTURE)
SUT VICRYL  9 0 (SUTURE)
SUT VICRYL 9 0 (SUTURE) IMPLANT
SUTURE EHLN 10-0 CS-B-6CS-B-6 (SUTURE) IMPLANT
SYR TB 1ML LUER SLIP (SYRINGE) ×2 IMPLANT
WATER STERILE IRR 250ML POUR (IV SOLUTION) ×2 IMPLANT
WICK EYE OCUCEL (MISCELLANEOUS) IMPLANT
WIPE NON LINTING 3.25X3.25 (MISCELLANEOUS) ×2 IMPLANT

## 2016-07-02 NOTE — Anesthesia Postprocedure Evaluation (Signed)
Anesthesia Post Note  Patient: [name]  Procedure(s) Performed: Procedure(s) (LRB): CATARACT EXTRACTION PHACO AND INTRAOCULAR LENS PLACEMENT (IOC)  Left (Left)  Patient location during evaluation: PACU Anesthesia Type: MAC Level of consciousness: awake and alert Pain management: pain level controlled Vital Signs Assessment: post-procedure vital signs reviewed and stable Respiratory status: spontaneous breathing, nonlabored ventilation and respiratory function stable Cardiovascular status: stable Postop Assessment: no signs of nausea or vomiting Anesthetic complications: no    Veda Canning

## 2016-07-02 NOTE — Anesthesia Preprocedure Evaluation (Addendum)
Anesthesia Evaluation  Patient identified by MRN, date of birth, ID band Patient awake    Reviewed: Allergy & Precautions  Airway Mallampati: II  TM Distance: >3 FB     Dental  (+) Teeth Intact   Pulmonary former smoker,    breath sounds clear to auscultation       Cardiovascular + Peripheral Vascular Disease   Rhythm:Regular Rate:Normal     Neuro/Psych Anxiety CVA    GI/Hepatic GERD  ,  Endo/Other  Hypothyroidism   Renal/GU      Musculoskeletal   Abdominal   Peds  Hematology   Anesthesia Other Findings   Reproductive/Obstetrics                            Anesthesia Physical Anesthesia Plan  ASA: II  Anesthesia Plan: MAC   Post-op Pain Management:    Induction:   Airway Management Planned: Nasal Cannula  Additional Equipment:   Intra-op Plan:   Post-operative Plan:   Informed Consent: I have reviewed the patients History and Physical, chart, labs and discussed the procedure including the risks, benefits and alternatives for the proposed anesthesia with the patient or authorized representative who has indicated his/her understanding and acceptance.     Plan Discussed with:   Anesthesia Plan Comments:         Anesthesia Quick Evaluation

## 2016-07-02 NOTE — Transfer of Care (Signed)
Immediate Anesthesia Transfer of Care Note  Patient: [name]  Procedure(s) Performed: Procedure(s): CATARACT EXTRACTION PHACO AND INTRAOCULAR LENS PLACEMENT (IOC)  Left (Left)  Patient Location: PACU  Anesthesia Type: MAC  Level of Consciousness: awake, alert  and patient cooperative  Airway and Oxygen Therapy: Patient Spontanous Breathing and Patient connected to supplemental oxygen  Post-op Assessment: Post-op Vital signs reviewed, Patient's Cardiovascular Status Stable, Respiratory Function Stable, Patent Airway and No signs of Nausea or vomiting  Post-op Vital Signs: Reviewed and stable  Complications: No apparent anesthesia complications

## 2016-07-02 NOTE — H&P (Signed)
H+P reviewed and is up to date, please see paper chart.  

## 2016-07-02 NOTE — Anesthesia Procedure Notes (Signed)
Procedure Name: MAC Performed by: Natalija Mavis Pre-anesthesia Checklist: Patient identified, Emergency Drugs available, Suction available, Timeout performed and Patient being monitored Patient Re-evaluated:Patient Re-evaluated prior to inductionOxygen Delivery Method: Nasal cannula Placement Confirmation: positive ETCO2     

## 2016-07-02 NOTE — Op Note (Signed)
Date of Surgery: 07/02/2016  PREOPERATIVE DIAGNOSES: Visually significant nuclear sclerotic cataract, left eye.  POSTOPERATIVE DIAGNOSES: Same  PROCEDURES PERFORMED: Cataract extraction with intraocular lens implant, left eye with aid of Malyugin ring.  SURGEON: Almon Hercules, M.D.  ANESTHESIA: MAC and topical  IMPLANTS: [id number] +09.9 D  COMPLICATIONS: None.  DESCRIPTION OF PROCEDURE: Therapeutic options were discussed with the patient preoperatively, including a discussion of risks and benefits of surgery. Informed consent was obtained. An IOL-Master and immersion biometry were used to take the lens measurements, and a dilated fundus exam was performed within 6 months of the surgical date.  The patient was premedicated and brought to the operating room and placed on the operating table in the supine position. After adequate anesthesia, the patient was prepped and draped in the usual sterile ophthalmic fashion. A wire lid speculum was inserted and the microscope was positioned. A Superblade was used to create a paracentesis site at the limbus and a small amount of dilute preservative free lidocaine was instilled into the anterior chamber, followed by dispersive viscoelastic. A clear corneal incision was created temporally using a 2.4 mm keratome blade. The pupil remained at a suboptimal 41m of dilation, and so the decision was made to place a Malyugin ring.  Capsulorrhexis was then performed. In situ phacoemulsification was performed.  Cortical material was removed with the irrigation-aspiration unit. Dispersive viscoelastic was instilled to open the capsular bag. A posterior chamber intraocular lens with the specifications above was inserted and positioned. The Malyugin ring was removed.  Irrigation-aspiration was used to remove all viscoelastic. Cefuroxime 1cc was instilled into the anterior chamber, and the corneal incision was checked and found to be water tight. The eyelid speculum was  removed.  The operative eye was covered with protective goggles after instilling 1 drop of timolol and brimonidine. The patient tolerated the procedure well. There were no complications.

## 2016-07-03 ENCOUNTER — Encounter: Payer: Self-pay | Admitting: Ophthalmology

## 2016-07-10 DIAGNOSIS — E890 Postprocedural hypothyroidism: Secondary | ICD-10-CM | POA: Diagnosis not present

## 2016-07-11 ENCOUNTER — Encounter: Payer: Self-pay | Admitting: Ophthalmology

## 2016-07-17 DIAGNOSIS — E890 Postprocedural hypothyroidism: Secondary | ICD-10-CM | POA: Diagnosis not present

## 2016-07-19 DIAGNOSIS — Z0101 Encounter for examination of eyes and vision with abnormal findings: Secondary | ICD-10-CM | POA: Diagnosis not present

## 2016-07-24 ENCOUNTER — Encounter: Payer: Self-pay | Admitting: *Deleted

## 2016-07-24 DIAGNOSIS — Z006 Encounter for examination for normal comparison and control in clinical research program: Secondary | ICD-10-CM

## 2016-07-24 NOTE — Progress Notes (Signed)
STROKE-[facility] Research month 18 follow up visit completed. Patient has had no changes in her health. She had cataract surgery recently and did well. No changes in her cardiovascular medication. Her next research required visit is due no later than 15/NOV/2018.

## 2016-08-07 ENCOUNTER — Encounter: Payer: Self-pay | Admitting: Adult Health

## 2016-08-07 ENCOUNTER — Ambulatory Visit (INDEPENDENT_AMBULATORY_CARE_PROVIDER_SITE_OTHER): Payer: Medicare HMO | Admitting: Adult Health

## 2016-08-07 VITALS — BP 156/68 | HR 83 | Ht 62.5 in | Wt 127.2 lb

## 2016-08-07 DIAGNOSIS — I959 Hypotension, unspecified: Secondary | ICD-10-CM | POA: Diagnosis not present

## 2016-08-07 NOTE — Patient Instructions (Addendum)
Hypotension  Hypotension, commonly called low blood pressure, is when the force of blood pumping through your arteries is too weak. Arteries are blood vessels that carry blood from the heart throughout the body. When blood pressure is too low, you may not get enough blood to your brain or to the rest of your organs. This can cause weakness, light-headedness, rapid heartbeat, and fainting.  Depending on the cause and severity, hypotension may be harmless (benign) or cause serious problems (critical).  What are the causes?  Possible causes of hypotension include:  · Blood loss.  · Loss of body fluids (dehydration).  · Heart problems.  · Hormone (endocrine) problems.  · Pregnancy.  · Severe infection.  · Lack of certain nutrients.  · Severe allergic reactions (anaphylaxis).  · Certain medicines, such as blood pressure medicine or medicines that make the body lose excess fluids (diuretics). Sometimes, hypotension can be caused by not taking medicine as directed, such as taking too much of a certain medicine.    What increases the risk?  Certain factors can make you more likely to develop hypotension, including:  · Age. Risk increases as you get older.  · Conditions that affect the heart or the central nervous system.  · Taking certain medicines, such as blood pressure medicine or diuretics.  · Being pregnant.    What are the signs or symptoms?  Symptoms of this condition may include:  · Weakness.  · Light-headedness.  · Dizziness.  · Blurred vision.  · Fatigue.  · Rapid heartbeat.  · Fainting, in severe cases.    How is this diagnosed?  This condition is diagnosed based on:  · Your medical history.  · Your symptoms.  · Your blood pressure measurement. Your health care provider will check your blood pressure when you are:  ? Lying down.  ? Sitting.  ? Standing.    A blood pressure reading is recorded as two numbers, such as "120 over 80" (or 120/80). The first ("top") number is called the systolic pressure. It is a  measure of the pressure in your arteries as your heart beats. The second ("bottom") number is called the diastolic pressure. It is a measure of the pressure in your arteries when your heart relaxes between beats. Blood pressure is measured in a unit called mm Hg. Healthy blood pressure for adults is 120/80. If your blood pressure is below 90/60, you may be diagnosed with hypotension.  Other information or tests that may be used to diagnose hypotension include:  · Your other vital signs, such as your heart rate and temperature.  · Blood tests.  · Tilt table test. For this test, you will be safely secured to a table that moves you from a lying position to an upright position. Your heart rhythm and blood pressure will be monitored during the test.    How is this treated?  Treatment for this condition may include:  · Changing your diet. This may involve eating more salt (sodium) or drinking more water.  · Taking medicines to raise your blood pressure.  · Changing the dosage of certain medicines you are taking that might be lowering your blood pressure.  · Wearing compression stockings. These stockings help to prevent blood clots and reduce swelling in your legs.    In some cases, you may need to go to the hospital for:  · Fluid replacement. This means you will receive fluids through an IV tube.  · Blood replacement. This means you will receive   instructions from your health care provider about eating or drinking restrictions. A healthy diet includes:  Fresh fruits and vegetables.  Whole grains.  Lean meats.  Low-fat dairy products.  Eat extra salt only as directed. Do not add  extra salt to your diet unless your health care provider told you to do that.  Eat frequent, small meals.  Avoid standing up suddenly after eating. Medicines   Take over-the-counter and prescription medicines only as told by your health care provider.  Follow instructions from your health care provider about changing the dosage of your current medicines, if this applies.  Do not stop or adjust any of your medicines on your own. General instructions   Wear compression stockings as told by your health care provider.  Get up slowly from lying down or sitting positions. This gives your blood pressure a chance to adjust.  Avoid hot showers and excessive heat as directed by your health care provider.  Return to your normal activities as told by your health care provider. Ask your health care provider what activities are safe for you.  Do not use any products that contain nicotine or tobacco, such as cigarettes and e-cigarettes. If you need help quitting, ask your health care provider.  Keep all follow-up visits as told by your health care provider. This is important. Contact a health care provider if:  You vomit.  You have diarrhea.  You have a fever for more than 2-3 days.  You feel more thirsty than usual.  You feel weak and tired. Get help right away if:  You have chest pain.  You have a fast or irregular heartbeat.  You develop numbness in any part of your body.  You cannot move your arms or your legs.  You have trouble speaking.  You become sweaty or feel light-headed.  You faint.  You feel short of breath.  You have trouble staying awake.  You feel confused. This information is not intended to replace advice given to you by your health care provider. Make sure you discuss any questions you have with your health care provider. Document Released: 02/26/2005 Document Revised: 09/16/2015 Document Reviewed: 08/18/2015 Elsevier Interactive Patient Education  2017  [place] to monitor BP and bring log with you to cardiologist appt. Continue all medications as directed. Has colonoscopy appt this Friday 08/10/2016. Has complete physical scheduled for summer 2018. Please call clinic with any questions/concerns.

## 2016-08-07 NOTE — Assessment & Plan Note (Signed)
Supine BP 145/76 HR 71 Sitting BP 155/79 HR 75 Standing BP 156/68 HR 83 EKG: NSR Poss L Atrial Enlargement Anteroseptal infarct, age indeterminate. Today's EKG compared with Oct 2016- exactly the same. Cards referral/Dr. Cathie Olden placed. Continue monitoring/recording BP-please bring log to cards appt. CPE in summer 2018-please call clinic with any questions/concers.

## 2016-08-07 NOTE — Progress Notes (Signed)
Subjective:    Patient ID: [name], female    DOB: 09-30-1945, 71 y.o.   MRN: [id number]  HPI: [name] presents for hypotension that has been consistently occurring > 4 weeks.  She denies change in diet, exercise, or increased dehydration.  She is followed by stroke clinic and neurology r/t  "small subcentimeter non-hemorrhagic infarct in the right occipital cortex and MRA showed occlusion of the nondominant right vertebral artery. There was high-grade stenosis noted in the left superior cerebellar artery as well. CT angiogram of the neck and brain were subsequently obtained which confirmed these findings. LDL cholesterol was elevated 138.  Transthoracic echo showed normal ejection fraction without cardiac source of embolism" that occurred Oct 2016. She denies hx of hypotension, she has always been "boderline high". She had one episode of dizziness that occurred late April 2018.  Episode was short lived and self resolved after a few minutes.   Home BP recordings SBP 80-130 (mostly 90-100s), DBP 50-80 (mostly 60-70).  HR 60-80s She denies acute pain or blood noted in urine/feces.  She has colonoscopy scheduled for this Friday and she is holding daily Plavix per GI orders.  She is currently taking OTC ASA.  Patient Care Team    Relationship Specialty Notifications Start End  Mina Marble D, NP PCP - General Family Medicine  05/29/16   Almedia Balls, MD Consulting Physician Orthopedic Surgery  05/29/16   Garvin Fila, MD Consulting Physician Neurology  05/29/16   Milus Banister, MD Attending Physician Gastroenterology  05/29/16   Ronnell Freshwater, MD Referring Physician Ophthalmology  05/29/16     Patient Active Problem List   Diagnosis Date Noted  . Health care maintenance 05/29/2016  . Cerebral infarction due to thrombosis of right vertebral artery ([place])   . Cerebral infarction ([place]) 12/26/2014  . Anxiety 12/26/2014  . Hypothyroidism (acquired) 12/26/2014  . GERD without  esophagitis 12/26/2014  . Glaucoma 12/26/2014  . Allergic rhinitis 12/26/2014  . Cerebrovascular accident (CVA) ([place]) 12/26/2014  . Cerebral infarction due to embolism of right vertebral artery ([place])   . Visual field loss   . Occlusion and stenosis of vertebral artery      Past Medical History:  Diagnosis Date  . Anxiety   . Barrett esophagus 2003  . Diverticulitis   . Diverticulosis   . GERD (gastroesophageal reflux disease)   . Glaucoma   . Grave's disease   . H. pylori infection   . Hepatitis A    in her 30 from food  . Hyperlipidemia   . Hypothyroidism   . Increased pressure in the eye   . Insomnia   . Morton's neuroma   . Murmur   . Scoliosis   . Stroke [facility]) 12/2014   no deficits     Past Surgical History:  Procedure Laterality Date  . APPENDECTOMY  1958  . CATARACT EXTRACTION Left   . CATARACT EXTRACTION W/PHACO Left 07/02/2016   Procedure: CATARACT EXTRACTION PHACO AND INTRAOCULAR LENS PLACEMENT ([place])  Left;  Surgeon: Ronnell Freshwater, MD;  Location: [place] [facility];  Service: Ophthalmology;  Laterality: Left;  . CERVICAL LAMINECTOMY  1995  . EYE SURGERY     due to graves disease  . FEMUR SURGERY Right    benign bone growth  . FOOT NEUROMA SURGERY Right   . LAPAROSCOPIC ASSISTED VAGINAL HYSTERECTOMY    . LAPAROSCOPIC SIGMOID COLECTOMY  05/13/06   Dr Zella Richer  . [place] SURGERY  2009  .  TONSILLECTOMY  1992  . TUBAL LIGATION       Family History  Problem Relation Age of Onset  . Other Father 33       SEPSIS  . Hypertension Father   . Stroke Father 6       CVA  . Heart failure Mother 11  . Heart murmur Mother   . Heart attack Sister 57  . Diabetes Brother   . Breast cancer Sister   . Stroke Sister   . Cirrhosis Brother   . Hypertension Sister        x 2  . Diabetes Sister   . Heart murmur Daughter      History  Drug Use No     History  Alcohol Use  . 4.2 oz/week  . 7 Glasses of wine per week     History   Smoking Status  . Former Smoker  . Packs/day: 0.25  . Types: Cigarettes  . Quit date: 03/12/1978  Smokeless Tobacco  . Never Used    Comment: quit in [place] Encounter Prescriptions as of 08/07/2016  Medication Sig Note  . acetaminophen (TYLENOL) 325 MG tablet Take 650 mg by mouth as needed.    Francia Greaves THYROID 60 MG tablet Take 1 tablet by mouth daily.  12/26/2014: Take with 15 mg on Saturday and Sunday.  . brimonidine-timolol (COMBIGAN) 0.2-0.5 % ophthalmic solution Place 1 drop into the left eye every 12 (twelve) hours.   . Calcium Carb-Cholecalciferol (CALCIUM + D3) 600-200 MG-UNIT TABS Take by mouth.   . clopidogrel (PLAVIX) 75 MG tablet Take 1 tablet (75 mg total) by mouth daily. Pt will be seeing PCP for future refills if they will take over the refills next week   . diazepam (VALIUM) 5 MG tablet Take 5 mg by mouth at bedtime as needed for anxiety (sleep).    . dorzolamide (TRUSOPT) 2 % ophthalmic solution Place 1 drop into the right eye daily.    Marland Kitchen latanoprost (XALATAN) 0.005 % ophthalmic solution Place 1 drop into both eyes at bedtime.   . Multiple Vitamin (MULTIVITAMIN) tablet Take 1 tablet by mouth daily.   . pantoprazole (PROTONIX) 40 MG tablet Take 40 mg by mouth daily.   . rosuvastatin (CRESTOR) 5 MG tablet Take 1 tablet (5 mg total) by mouth at bedtime.   Marland Kitchen thyroid (ARMOUR) 15 MG tablet Take 15 mg by mouth. One tablet 4 times weekly   . Na Sulfate-K Sulfate-Mg Sulf 17.5-3.13-1.6 GM/180ML SOLN Take 1 kit by mouth as directed.   . [DISCONTINUED] thyroid (ARMOUR THYROID) 15 MG tablet Take 15 mg by mouth 2 (two) times a week.    No facility-administered encounter medications on file as of 08/07/2016.     Allergies: Neuromuscular blocking agents and Vicodin [hydrocodone-acetaminophen]  Body mass index is 22.89 kg/m.  Blood pressure (!) 156/68, pulse 83, height 5' 2.5" (1.588 m), weight 127 lb 3.2 oz (57.7 kg).   Review of Systems  Constitutional: Negative  for activity change, appetite change, chills, diaphoresis, fatigue, fever and unexpected weight change.  Eyes: Negative for visual disturbance.  Respiratory: Positive for shortness of breath. Negative for cough, chest tightness, wheezing and stridor.        SOB with exertion-intermittent occurring > 12 months.  Cardiovascular: Negative for chest pain, palpitations and leg swelling.  Gastrointestinal: Negative for abdominal distention, abdominal pain, blood in stool, constipation, diarrhea, nausea and vomiting.  Endocrine: Negative for cold intolerance, heat intolerance, polydipsia, polyphagia  and polyuria.  Genitourinary: Negative for difficulty urinating, flank pain and hematuria.  Skin: Negative for color change, pallor, rash and wound.  Allergic/Immunologic: Negative for immunocompromised state.  Neurological: Positive for dizziness. Negative for tremors, facial asymmetry, speech difficulty, weakness, light-headedness, numbness and headaches.  Hematological: Does not bruise/bleed easily.       Objective:   Physical Exam  Constitutional: She is oriented to person, place, and time. She appears well-developed and well-nourished. No distress.  HENT:  Head: Normocephalic and atraumatic.  Right Ear: External ear normal.  Left Ear: External ear normal.  Eyes: Conjunctivae are normal. Pupils are equal, round, and reactive to light.  Neck: Normal range of motion. Neck supple.  Cardiovascular: Normal rate, regular rhythm, normal heart sounds and intact distal pulses.   No murmur heard. Pulmonary/Chest: Effort normal and breath sounds normal. No respiratory distress. She has no wheezes. She has no rales. She exhibits no tenderness.  Musculoskeletal: Normal range of motion.  Lymphadenopathy:    She has no cervical adenopathy.  Neurological: She is alert and oriented to person, place, and time. Coordination normal.  Skin: Skin is warm and dry. No rash noted. She is not diaphoretic. No erythema.  No pallor.  Psychiatric: She has a normal mood and affect. Her behavior is normal. Judgment and thought content normal.  Nursing note and vitals reviewed.         Assessment & Plan:   1. Hypotension, unspecified hypotension type     Hypotension Supine BP 145/76 HR 71 Sitting BP 155/79 HR 75 Standing BP 156/68 HR 83 EKG: NSR Poss L Atrial Enlargement Anteroseptal infarct, age indeterminate. Today's EKG compared with Oct 2016- exactly the same. Cards referral/Dr. Cathie Olden placed. Continue monitoring/recording BP-please bring log to cards appt. CPE in summer 2018-please call clinic with any questions/concers.    FOLLOW-UP:  No Follow-up on file.

## 2016-08-08 NOTE — Addendum Note (Signed)
Addended by: Fonnie Mu on: 08/08/2016 08:18 AM   Modules accepted: Orders

## 2016-08-10 ENCOUNTER — Encounter: Payer: Medicare HMO | Admitting: Gastroenterology

## 2016-08-10 ENCOUNTER — Ambulatory Visit (AMBULATORY_SURGERY_CENTER): Payer: Medicare HMO | Admitting: Gastroenterology

## 2016-08-10 ENCOUNTER — Encounter: Payer: Self-pay | Admitting: Gastroenterology

## 2016-08-10 VITALS — BP 101/67 | HR 62 | Temp 97.1°F | Resp 9 | Ht 62.0 in | Wt 127.0 lb

## 2016-08-10 DIAGNOSIS — D122 Benign neoplasm of ascending colon: Secondary | ICD-10-CM

## 2016-08-10 DIAGNOSIS — Z8673 Personal history of transient ischemic attack (TIA), and cerebral infarction without residual deficits: Secondary | ICD-10-CM | POA: Diagnosis not present

## 2016-08-10 DIAGNOSIS — Z1211 Encounter for screening for malignant neoplasm of colon: Secondary | ICD-10-CM | POA: Diagnosis not present

## 2016-08-10 DIAGNOSIS — K6389 Other specified diseases of intestine: Secondary | ICD-10-CM | POA: Diagnosis not present

## 2016-08-10 DIAGNOSIS — R69 Illness, unspecified: Secondary | ICD-10-CM | POA: Diagnosis not present

## 2016-08-10 DIAGNOSIS — E039 Hypothyroidism, unspecified: Secondary | ICD-10-CM | POA: Diagnosis not present

## 2016-08-10 DIAGNOSIS — Z1212 Encounter for screening for malignant neoplasm of rectum: Secondary | ICD-10-CM | POA: Diagnosis not present

## 2016-08-10 DIAGNOSIS — K635 Polyp of colon: Secondary | ICD-10-CM | POA: Diagnosis not present

## 2016-08-10 MED ORDER — SODIUM CHLORIDE 0.9 % IV SOLN: 500.0000 mL | INTRAVENOUS | Status: DC

## 2016-08-10 NOTE — Progress Notes (Signed)
Called to room to assist during endoscopic procedure.  Patient ID and intended procedure confirmed with present staff. Received instructions for my participation in the procedure from the performing physician.  

## 2016-08-10 NOTE — Patient Instructions (Signed)
Handouts given on polyps and diverticulosis  YOU HAD AN ENDOSCOPIC PROCEDURE TODAY: Refer to the procedure report and other information in the discharge instructions given to you for any specific questions about what was found during the examination. If this information does not answer your questions, please call [place] office at [phone] to clarify.   YOU SHOULD EXPECT: Some feelings of bloating in the abdomen. Passage of more gas than usual. Walking can help get rid of the air that was put into your GI tract during the procedure and reduce the bloating. If you had a lower endoscopy (such as a colonoscopy or flexible sigmoidoscopy) you may notice spotting of blood in your stool or on the toilet paper. Some abdominal soreness may be present for a day or two, also.  DIET: Your first meal following the procedure should be a light meal and then it is ok to progress to your normal diet. A half-sandwich or bowl of soup is an example of a good first meal. Heavy or fried foods are harder to digest and may make you feel nauseous or bloated. Drink plenty of fluids but you should avoid alcoholic beverages for 24 hours. If you had a esophageal dilation, please see attached instructions for diet.    ACTIVITY: Your care partner should take you home directly after the procedure. You should plan to take it easy, moving slowly for the rest of the day. You can resume normal activity the day after the procedure however YOU SHOULD NOT DRIVE, use power tools, machinery or perform tasks that involve climbing or major physical exertion for 24 hours (because of the sedation medicines used during the test).   SYMPTOMS TO REPORT IMMEDIATELY: A gastroenterologist can be reached at any hour. Please call [phone]  for any of the following symptoms:  Following lower endoscopy (colonoscopy, flexible sigmoidoscopy) Excessive amounts of blood in the stool  Significant tenderness, worsening of abdominal pains  Swelling of  the abdomen that is new, acute  Fever of 100 or higher    FOLLOW UP:  If any biopsies were taken you will be contacted by phone or by letter within the next 1-3 weeks. Call [phone]  if you have not heard about the biopsies in 3 weeks.  Please also call with any specific questions about appointments or follow up tests.  

## 2016-08-10 NOTE — Progress Notes (Signed)
To recovery vss report to [employer]

## 2016-08-10 NOTE — Progress Notes (Signed)
Pt's states no medical or surgical changes since previsit or office vittsit.  Pt. Has "sty" left upper eye lid.  Eyelid red and swollen.  She states she noticed it Tuesday.

## 2016-08-10 NOTE — Op Note (Signed)
[facility] Patient Name: Arnett Galindez Procedure Date: 08/10/2016 9:59 AM MRN: [id number] Endoscopist: Mauri Pole , MD Age: 71 Referring MD:  Date of Birth: [birth year] Gender: Female Account #: [id number] Procedure:                Colonoscopy Indications:              Screening for colorectal malignant neoplasm. H/o                            sigmoidectomy s/p perforated diverticulitis Medicines:                Monitored Anesthesia Care Procedure:                Pre-Anesthesia Assessment:                           - Prior to the procedure, a History and Physical                            was performed, and patient medications and                            allergies were reviewed. The patient's tolerance of                            previous anesthesia was also reviewed. The risks                            and benefits of the procedure and the sedation                            options and risks were discussed with the patient.                            All questions were answered, and informed consent                            was obtained. Prior Anticoagulants: The patient has                            taken no previous anticoagulant or antiplatelet                            agents. ASA Grade Assessment: III - A patient with                            severe systemic disease. After reviewing the risks                            and benefits, the patient was deemed in                            satisfactory condition to undergo the procedure.  After obtaining informed consent, the colonoscope                            was passed under direct vision. Throughout the                            procedure, the patient's blood pressure, pulse, and                            oxygen saturations were monitored continuously. The                            Model CF-HQ190L [id number]) scope was introduced                            through the  anus and advanced to the the terminal                            ileum, with identification of the appendiceal                            orifice and IC valve. The colonoscopy was performed                            without difficulty. The patient tolerated the                            procedure well. The quality of the bowel                            preparation was excellent. The terminal ileum,                            ileocecal valve, appendiceal orifice, and rectum                            were photographed. Scope In: 10:09:38 AM Scope Out: 10:19:15 AM Scope Withdrawal Time: 0 hours 6 minutes 39 seconds  Total Procedure Duration: 0 hours 9 minutes 37 seconds  Findings:                 The perianal and digital rectal examinations were                            normal.                           Two sessile polyps were found in the ascending                            colon. The polyps were 2 to 3 mm in size. These                            polyps were removed with a cold biopsy forceps.  Resection and retrieval were complete.                           A few small and large-mouthed diverticula were                            found in the descending colon and ascending colon.                           There was evidence of a prior end-to-side                            colo-colonic anastomosis in the sigmoid colon. This                            was patent and was characterized by erythema. The                            anastomosis was traversed.                           The exam was otherwise without abnormality. Complications:            No immediate complications. Estimated Blood Loss:     Estimated blood loss was minimal. Impression:               - Two 2 to 3 mm polyps in the ascending colon,                            removed with a cold biopsy forceps. Resected and                            retrieved.                           -  Diverticulosis in the descending colon and in the                            ascending colon.                           - Patent end-to-side colo-colonic anastomosis,                            characterized by erythema.                           - The examination was otherwise normal. Recommendation:           - Patient has a contact number available for                            emergencies. The signs and symptoms of potential                            delayed complications were discussed with the  patient. Return to normal activities tomorrow.                            Written discharge instructions were provided to the                            patient.                           - Resume previous diet.                           - Continue present medications.                           - Await pathology results.                           - Repeat colonoscopy in 5-10 years for surveillance                            based on pathology results. Mauri Pole, MD 08/10/2016 10:24:45 AM This report has been signed electronically.

## 2016-08-13 ENCOUNTER — Telehealth: Payer: Self-pay | Admitting: *Deleted

## 2016-08-13 NOTE — Telephone Encounter (Signed)
  Follow up Call-  Call back number 08/10/2016 03/24/2014  Post procedure Call Back phone  # [phone]  Permission to leave phone message Yes Yes  Some recent data might be hidden     Patient questions:  Do you have a fever, pain , or abdominal swelling? No. Pain Score  0 *  Have you tolerated food without any problems? Yes.    Have you been able to return to your normal activities? Yes.    Do you have any questions about your discharge instructions: Diet   No. Medications  No. Follow up visit  No.  Do you have questions or concerns about your Care? No.  Actions: * If pain score is 4 or above: No action needed, pain <4.

## 2016-08-15 ENCOUNTER — Encounter: Payer: Self-pay | Admitting: Gastroenterology

## 2016-08-27 NOTE — Progress Notes (Signed)
Cardiology Office Note    Date:  08/28/2016   ID:  [name], DOB 09-Feb-1946, MRN [id number]  PCP:  Esaw Grandchild, NP  Cardiologist: New to Dr. Lennie Odor  Chief Complaint: Hypotension  History of Present Illness:   [name] is a 71 y.o. female with hx of CVA in 2016, Hyperlipidemia, hypothyroidism, GERD who was referred by Mina Marble, NP  for evaluation of hypotension.  Echocardiogram 12/2014 showed normal LV function to 60-65%, no wall motion abnormality, grade 1 diastolic dysfunction.  Patient has noted low BP/dizziness at home. Evaluated by PCP 08/07/16. Her blood pressure was elevated during visit. EKG showed normal sinus rhythm without acute changes. She was not orthostatic on vitals as below: Supine BP 145/76 HR 71 Sitting BP 155/79 HR 75 Standing BP 156/68 HR 83  She is here for further evaluation. She has a intermittent dizziness mostly with head movement. Lasting for a few second. Also complains of intermittent palpitation occurring once every month. Self resolution in few seconds. No associated symptoms. She denies chest pain, shortness of breath, orthopnea, PND, syncope, lower extremity edema or melena. She is enrolled in stroke A. fib trial and did not qualify for loop recorder implantation.  She has been checking her blood pressure since this episode. Noted low blood pressure in left arm compared to right. She has neck and chronic cervical disc issue.   Past Medical History:  Diagnosis Date  . Anxiety   . Barrett esophagus 2003  . Diverticulitis   . Diverticulosis   . GERD (gastroesophageal reflux disease)   . Glaucoma   . Grave's disease   . H. pylori infection   . Hepatitis A    in her 62 from food  . Hyperlipidemia   . Hypothyroidism   . Increased pressure in the eye   . Insomnia   . Morton's neuroma   . Murmur   . Scoliosis   . Stroke [facility]) 12/2014   no deficits    Past Surgical History:  Procedure Laterality Date  . APPENDECTOMY  1958   . CATARACT EXTRACTION Left   . CATARACT EXTRACTION W/PHACO Left 07/02/2016   Procedure: CATARACT EXTRACTION PHACO AND INTRAOCULAR LENS PLACEMENT ([place])  Left;  Surgeon: Ronnell Freshwater, MD;  Location: [facility];  Service: Ophthalmology;  Laterality: Left;  . CERVICAL LAMINECTOMY  1995  . EYE SURGERY     due to graves disease  . FEMUR SURGERY Right    benign bone growth  . FOOT NEUROMA SURGERY Right   . LAPAROSCOPIC ASSISTED VAGINAL HYSTERECTOMY    . LAPAROSCOPIC SIGMOID COLECTOMY  05/13/06   Dr Zella Richer  . [place] SURGERY  2009  . TONSILLECTOMY  1992  . TUBAL LIGATION      Current Medications: Prior to Admission medications   Medication Sig Start Date End Date Taking? Authorizing Provider  acetaminophen (TYLENOL) 325 MG tablet Take 650 mg by mouth as needed.     [provider]  ARMOUR THYROID 60 MG tablet Take 1 tablet by mouth daily.  01/26/14   [provider]  brimonidine-timolol (COMBIGAN) 0.2-0.5 % ophthalmic solution Place 1 drop into the left eye every 12 (twelve) hours.    [provider]  Calcium Carb-Cholecalciferol (CALCIUM + D3) 600-200 MG-UNIT TABS Take by mouth.    [provider]  clopidogrel (PLAVIX) 75 MG tablet Take 1 tablet (75 mg total) by mouth daily. Pt will be seeing PCP for future refills if they will take  over the refills next week 05/29/16   Danford, Valetta Fuller D, NP  diazepam (VALIUM) 5 MG tablet Take 5 mg by mouth at bedtime as needed for anxiety (sleep).  08/20/14   [provider]  dorzolamide (TRUSOPT) 2 % ophthalmic solution Place 1 drop into the right eye daily.     [provider]  latanoprost (XALATAN) 0.005 % ophthalmic solution Place 1 drop into both eyes at bedtime.    [provider]  Multiple Vitamin (MULTIVITAMIN) tablet Take 1 tablet by mouth daily.    [provider]  pantoprazole (PROTONIX) 40 MG tablet Take 40 mg by mouth daily.    [provider]    rosuvastatin (CRESTOR) 5 MG tablet Take 1 tablet (5 mg total) by mouth at bedtime. 06/13/16   Danford, Valetta Fuller D, NP  thyroid (ARMOUR) 15 MG tablet Take 15 mg by mouth. One tablet 4 times weekly    [provider]    Allergies:   Neuromuscular blocking agents and Vicodin [hydrocodone-acetaminophen]   Social History   Social History  . Marital status: Married    Spouse name: N/A  . Number of children: 1  . Years of education: N/A   Occupational History  . 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 Smoking status: Former Smoker    Packs/day: 0.25    Types: Cigarettes    Quit date: 03/12/1978  . Smokeless tobacco: Never Used     Comment: quit in 1978  . Alcohol use 4.2 oz/week    7 Glasses of wine per week  . Drug use: No  . Sexual activity: Not Currently    Birth control/ protection: None   Other Topics Concern  . None   Social History Narrative  . None     Family History:  The patient's family history includes Breast cancer in her sister; Cirrhosis in her brother; Diabetes in her brother and sister; Heart attack (age of onset: 63) in her sister; Heart failure (age of onset: 82) in her mother; Heart murmur in her daughter and mother; Hypertension in her father and sister; Other (age of onset: 43) in her father; Stroke in her sister; Stroke (age of onset: 26) in her father.   ROS:   Please see the history of present illness.    ROS All other systems reviewed and are negative.   PHYSICAL EXAM:   VS:  BP 98/80 (BP Location: Left Arm, Patient Position: Sitting, Cuff Size: Normal)   Pulse 78   Ht 5\' 2"  (1.575 m)   Wt 125 lb (56.7 kg)   BMI 22.86 kg/m    GEN: Well nourished, well developed, in no acute distress  HEENT: normal  Neck: no JVD, carotid bruits, or masses Cardiac: RRR; no murmurs, rubs, or gallops,no edema  Respiratory:  clear to auscultation bilaterally, normal work of breathing GI: soft, nontender,  nondistended, + BS MS: no deformity or atrophy  Skin: warm and dry, no rash Neuro:  Alert and Oriented x 3, Strength and sensation are intact Psych: euthymic mood, full affect Extremity: Equal pulses in lower extremities. Minimally weak pulse on left upper extremity compared to right approximately.  Wt Readings from Last 3 Encounters:  08/28/16 125 lb (56.7 kg)  08/10/16 127 lb (57.6 kg)  08/07/16 127 lb 3.2 oz (57.7 kg)      Studies/Labs Reviewed:   EKG:  EKG is not ordered today.  Recent Labs: No results found for requested labs within last 8760 hours.   Lipid Panel    Component Value Date/Time   CHOL 237 (H) 12/27/2014 0434   TRIG 190 (H) 12/27/2014 0434   HDL 61 12/27/2014 0434   CHOLHDL 3.9 12/27/2014 0434   VLDL 38 12/27/2014 0434   LDLCALC 138 (H) 12/27/2014 0434    Additional studies/ records that were reviewed today include:   AS above    ASSESSMENT & PLAN:    1. Low blood pressure on left arm - Right arm has a normal readings. Pulse minimally weak on left compared to right on upper extremities. Negative for orthostatic by vitals. She has a neck and cervical issue. Discuss Doppler study to rule out any obstruction. Patient has decided not to pursue with any evaluation currently. She will let us know if interested.  2. Palpitation/dizziness with history of CVA - She is enrolled in stroke A. fib trial and did not qualify for loop recorder. Her weight symptoms could be due to A. fib. Discuss 30 day event monitor to rule out arrhythmia. She will discuss with Dr. Leonie Man during follow-up appointment and let us know if interested. - EKG reassuring.  - She has TSH check today by endocrinologist. She will gets BMET and CBC with PCP to r/o any electrolytes abnormality.   3. Hypothyroidism - Approximately 6 weeks ago her TSH was abnormal and her supplementation was adjusted. Her symptoms started after wards. Repeat TSH check done today. Symptoms could be due to  abnormal thyroid level.  4. HLD -Continue statin    Plan discussed with DOD Dr. Curt Bears.    Medication Adjustments/Labs and Tests Ordered: Current medicines are reviewed at length with the patient today.  Concerns regarding medicines are outlined above.  Medication changes, Labs and Tests ordered today are listed in the Patient Instructions below. Patient Instructions  Medication Instructions:  Your physician recommends that you continue on your current medications as directed. Please refer to the Current Medication list given to you today.  Labwork: NONE  Testing/Procedures: NONE  Follow-Up: Your physician wants you to follow-up as needed.    If you need a refill on your cardiac medications before your next appointment, please call your pharmacy.       Jarrett Soho, [place]  08/28/2016 10:40 AM    [facility] [address], [place], [place]  [address] Phone: [phone]; Fax: [phone]

## 2016-08-28 ENCOUNTER — Ambulatory Visit (INDEPENDENT_AMBULATORY_CARE_PROVIDER_SITE_OTHER): Payer: Medicare HMO | Admitting: Physician Assistant

## 2016-08-28 ENCOUNTER — Encounter: Payer: Self-pay | Admitting: Physician Assistant

## 2016-08-28 VITALS — BP 98/80 | HR 78 | Ht 62.0 in | Wt 125.0 lb

## 2016-08-28 DIAGNOSIS — R42 Dizziness and giddiness: Secondary | ICD-10-CM

## 2016-08-28 DIAGNOSIS — E039 Hypothyroidism, unspecified: Secondary | ICD-10-CM | POA: Diagnosis not present

## 2016-08-28 DIAGNOSIS — R6889 Other general symptoms and signs: Secondary | ICD-10-CM

## 2016-08-28 DIAGNOSIS — R002 Palpitations: Secondary | ICD-10-CM | POA: Diagnosis not present

## 2016-08-28 DIAGNOSIS — E785 Hyperlipidemia, unspecified: Secondary | ICD-10-CM

## 2016-08-28 DIAGNOSIS — E890 Postprocedural hypothyroidism: Secondary | ICD-10-CM | POA: Diagnosis not present

## 2016-08-28 NOTE — Patient Instructions (Addendum)
Medication Instructions:  Your physician recommends that you continue on your current medications as directed. Please refer to the Current Medication list given to you today.  Labwork: NONE  Testing/Procedures: NONE  Follow-Up: Your physician wants you to follow-up as needed.  If you need a refill on your cardiac medications before your next appointment, please call your pharmacy.   

## 2016-09-03 ENCOUNTER — Telehealth: Payer: Self-pay | Admitting: Adult Health

## 2016-09-11 DIAGNOSIS — H0014 Chalazion left upper eyelid: Secondary | ICD-10-CM | POA: Diagnosis not present

## 2016-09-13 ENCOUNTER — Other Ambulatory Visit (INDEPENDENT_AMBULATORY_CARE_PROVIDER_SITE_OTHER): Payer: Medicare HMO

## 2016-09-13 DIAGNOSIS — R5383 Other fatigue: Secondary | ICD-10-CM | POA: Diagnosis not present

## 2016-09-13 DIAGNOSIS — I9589 Other hypotension: Secondary | ICD-10-CM | POA: Diagnosis not present

## 2016-09-13 DIAGNOSIS — K219 Gastro-esophageal reflux disease without esophagitis: Secondary | ICD-10-CM

## 2016-09-13 DIAGNOSIS — Z0000 Encounter for general adult medical examination without abnormal findings: Secondary | ICD-10-CM | POA: Diagnosis not present

## 2016-09-13 DIAGNOSIS — D582 Other hemoglobinopathies: Secondary | ICD-10-CM | POA: Diagnosis not present

## 2016-09-13 DIAGNOSIS — R7309 Other abnormal glucose: Secondary | ICD-10-CM | POA: Diagnosis not present

## 2016-09-13 DIAGNOSIS — R69 Illness, unspecified: Secondary | ICD-10-CM | POA: Diagnosis not present

## 2016-09-13 DIAGNOSIS — E039 Hypothyroidism, unspecified: Secondary | ICD-10-CM | POA: Diagnosis not present

## 2016-09-13 DIAGNOSIS — F419 Anxiety disorder, unspecified: Secondary | ICD-10-CM

## 2016-09-14 LAB — COMPREHENSIVE METABOLIC PANEL
A/G RATIO: 1.8 (ref 1.2–2.2)
ALBUMIN: 4.8 g/dL (ref 3.5–4.8)
ALT: 20 IU/L (ref 0–32)
AST: 27 IU/L (ref 0–40)
Alkaline Phosphatase: 100 IU/L (ref 39–117)
BUN/Creatinine Ratio: 24 (ref 12–28)
BUN: 17 mg/dL (ref 8–27)
Bilirubin Total: 0.5 mg/dL (ref 0.0–1.2)
CALCIUM: 10.1 mg/dL (ref 8.7–10.3)
CO2: 23 mmol/L (ref 20–29)
Chloride: 99 mmol/L (ref 96–106)
Creatinine, Ser: 0.72 mg/dL (ref 0.57–1.00)
GFR, EST AFRICAN AMERICAN: 97 mL/min/{1.73_m2} (ref >59)
GFR, EST NON AFRICAN AMERICAN: 85 mL/min/{1.73_m2} (ref >59)
GLOBULIN, TOTAL: 2.7 g/dL (ref 1.5–4.5)
Glucose: 108 mg/dL — ABNORMAL HIGH (ref 65–99)
POTASSIUM: 5.3 mmol/L — AB (ref 3.5–5.2)
SODIUM: 136 mmol/L (ref 134–144)
Total Protein: 7.5 g/dL (ref 6.0–8.5)

## 2016-09-14 LAB — CBC WITH DIFFERENTIAL/PLATELET
BASOS: 1 %
Basophils Absolute: 0 10*3/uL (ref 0.0–0.2)
EOS (ABSOLUTE): 0.1 10*3/uL (ref 0.0–0.4)
Eos: 2 %
Hematocrit: 44.9 % (ref 34.0–46.6)
Hemoglobin: 14.8 g/dL (ref 11.1–15.9)
IMMATURE GRANS (ABS): 0 10*3/uL (ref 0.0–0.1)
Immature Granulocytes: 0 %
LYMPHS ABS: 1.9 10*3/uL (ref 0.7–3.1)
LYMPHS: 44 %
MCH: 29.6 pg (ref 26.6–33.0)
MCHC: 33 g/dL (ref 31.5–35.7)
MCV: 90 fL (ref 79–97)
MONOS ABS: 0.5 10*3/uL (ref 0.1–0.9)
Monocytes: 12 %
NEUTROS ABS: 1.7 10*3/uL (ref 1.4–7.0)
Neutrophils: 41 %
PLATELETS: 269 10*3/uL (ref 150–379)
RBC: 5 x10E6/uL (ref 3.77–5.28)
RDW: 13.2 % (ref 12.3–15.4)
WBC: 4.2 10*3/uL (ref 3.4–10.8)

## 2016-09-14 LAB — HEMOGLOBIN A1C
ESTIMATED AVERAGE GLUCOSE: 123 mg/dL
Hgb A1c MFr Bld: 5.9 % — ABNORMAL HIGH (ref 4.8–5.6)

## 2016-09-14 LAB — LIPID PANEL
Chol/HDL Ratio: 2.8 ratio (ref 0.0–4.4)
Cholesterol, Total: 183 mg/dL (ref 100–199)
HDL: 65 mg/dL (ref >39)
LDL Calculated: 100 mg/dL — ABNORMAL HIGH (ref 0–99)
TRIGLYCERIDES: 89 mg/dL (ref 0–149)
VLDL Cholesterol Cal: 18 mg/dL (ref 5–40)

## 2016-09-14 LAB — VITAMIN B12: Vitamin B-12: 735 pg/mL (ref 232–1245)

## 2016-09-14 LAB — VITAMIN D 25 HYDROXY (VIT D DEFICIENCY, FRACTURES): VIT D 25 HYDROXY: 36.6 ng/mL (ref 30.0–100.0)

## 2016-09-14 LAB — TSH: TSH: 3.68 u[IU]/mL (ref 0.450–4.500)

## 2016-09-17 ENCOUNTER — Encounter: Payer: Self-pay | Admitting: Adult Health

## 2016-09-17 ENCOUNTER — Ambulatory Visit (INDEPENDENT_AMBULATORY_CARE_PROVIDER_SITE_OTHER): Payer: Medicare HMO | Admitting: Adult Health

## 2016-09-17 VITALS — BP 126/69 | HR 68 | Ht 62.0 in | Wt 125.0 lb

## 2016-09-17 DIAGNOSIS — I9589 Other hypotension: Secondary | ICD-10-CM | POA: Diagnosis not present

## 2016-09-17 DIAGNOSIS — I639 Cerebral infarction, unspecified: Secondary | ICD-10-CM

## 2016-09-17 DIAGNOSIS — Z0000 Encounter for general adult medical examination without abnormal findings: Secondary | ICD-10-CM | POA: Diagnosis not present

## 2016-09-17 NOTE — Patient Instructions (Addendum)
Heart-Healthy Eating Plan Many factors influence your heart health, including eating and exercise habits. Heart (coronary) risk increases with abnormal blood fat (lipid) levels. Heart-healthy meal planning includes limiting unhealthy fats, increasing healthy fats, and making other small dietary changes. This includes maintaining a healthy body weight to help keep lipid levels within a normal range. What is my plan? Your health care provider recommends that you:  Get no more than _________% of the total calories in your daily diet from fat.  Limit your intake of saturated fat to less than _________% of your total calories each day.  Limit the amount of cholesterol in your diet to less than _________ mg per day.  What types of fat should I choose?  Choose healthy fats more often. Choose monounsaturated and polyunsaturated fats, such as olive oil and canola oil, flaxseeds, walnuts, almonds, and seeds.  Eat more omega-3 fats. Good choices include salmon, mackerel, sardines, tuna, flaxseed oil, and ground flaxseeds. Aim to eat fish at least two times each week.  Limit saturated fats. Saturated fats are primarily found in animal products, such as meats, butter, and cream. Plant sources of saturated fats include palm oil, palm kernel oil, and coconut oil.  Avoid foods with partially hydrogenated oils in them. These contain trans fats. Examples of foods that contain trans fats are stick margarine, some tub margarines, cookies, crackers, and other baked goods. What general guidelines do I need to follow?  Check food labels carefully to identify foods with trans fats or high amounts of saturated fat.  Fill one half of your plate with vegetables and green salads. Eat 4-5 servings of vegetables per day. A serving of vegetables equals 1 cup of raw leafy vegetables,  cup of raw or cooked cut-up vegetables, or  cup of vegetable juice.  Fill one fourth of your plate with whole grains. Look for the word  "whole" as the first word in the ingredient list.  Fill one fourth of your plate with lean protein foods.  Eat 4-5 servings of fruit per day. A serving of fruit equals one medium whole fruit,  cup of dried fruit,  cup of fresh, frozen, or canned fruit, or  cup of 100% fruit juice.  Eat more foods that contain soluble fiber. Examples of foods that contain this type of fiber are apples, broccoli, carrots, beans, peas, and barley. Aim to get 20-30 g of fiber per day.  Eat more home-cooked food and less restaurant, buffet, and fast food.  Limit or avoid alcohol.  Limit foods that are high in starch and sugar.  Avoid fried foods.  Cook foods by using methods other than frying. Baking, boiling, grilling, and broiling are all great options. Other fat-reducing suggestions include: ? Removing the skin from poultry. ? Removing all visible fats from meats. ? Skimming the fat off of stews, soups, and gravies before serving them. ? Steaming vegetables in water or broth.  Lose weight if you are overweight. Losing just 5-10% of your initial body weight can help your overall health and prevent diseases such as diabetes and heart disease.  Increase your consumption of nuts, legumes, and seeds to 4-5 servings per week. One serving of dried beans or legumes equals  cup after being cooked, one serving of nuts equals 1 ounces, and one serving of seeds equals  ounce or 1 tablespoon.  You may need to monitor your salt (sodium) intake, especially if you have high blood pressure. Talk with your health care provider or dietitian to get  more information about reducing sodium. What foods can I eat? Grains  Breads, including [place], white, pita, wheat, raisin, rye, oatmeal, and [place]. Tortillas that are neither fried nor made with lard or trans fat. Low-fat rolls, including hotdog and hamburger buns and English muffins. Biscuits. Muffins. Waffles. Pancakes. Light popcorn. Whole-grain cereals. Flatbread.  Melba toast. Pretzels. Breadsticks. Rusks. Low-fat snacks and crackers, including oyster, saltine, matzo, graham, animal, and rye. Rice and pasta, including brown rice and those that are made with whole wheat. Vegetables All vegetables. Fruits All fruits, but limit coconut. Meats and Other Protein Sources Lean, well-trimmed beef, veal, pork, and lamb. Chicken and [place] without skin. All fish and shellfish. Wild duck, rabbit, pheasant, and venison. Egg whites or low-cholesterol egg substitutes. Dried beans, peas, lentils, and tofu.Seeds and most nuts. Dairy Low-fat or nonfat cheeses, including ricotta, string, and mozzarella. Skim or 1% milk that is liquid, powdered, or evaporated. Buttermilk that is made with low-fat milk. Nonfat or low-fat yogurt. Beverages Mineral water. Diet carbonated beverages. Sweets and Desserts Sherbets and fruit ices. Honey, jam, marmalade, jelly, and syrups. Meringues and gelatins. Pure sugar candy, such as hard candy, jelly beans, gumdrops, mints, marshmallows, and small amounts of dark chocolate. [employer] Eat all sweets and desserts in moderation. Fats and Oils Nonhydrogenated (trans-free) margarines. Vegetable oils, including soybean, sesame, sunflower, olive, peanut, safflower, corn, canola, and cottonseed. Salad dressings or mayonnaise that are made with a vegetable oil. Limit added fats and oils that you use for cooking, baking, salads, and as spreads. Other Cocoa powder. Coffee and tea. All seasonings and condiments. The items listed above may not be a complete list of recommended foods or beverages. Contact your dietitian for more options. What foods are not recommended? Grains Breads that are made with saturated or trans fats, oils, or whole milk. Croissants. Butter rolls. Cheese breads. Sweet rolls. Donuts. Buttered popcorn. Chow mein noodles. High-fat crackers, such as cheese or butter crackers. Meats and Other Protein Sources Fatty meats, such  as hotdogs, short ribs, sausage, spareribs, bacon, ribeye roast or steak, and mutton. High-fat deli meats, such as salami and bologna. Caviar. Domestic duck and goose. Organ meats, such as kidney, liver, sweetbreads, brains, gizzard, chitterlings, and heart. Dairy Cream, sour cream, cream cheese, and creamed cottage cheese. Whole milk cheeses, including blue (bleu), Monte[place] Jack, [place], [place], [place], [place], [place], [place], [place], and [place]. Whole or 2% milk that is liquid, evaporated, or condensed. Whole buttermilk. Cream sauce or high-fat cheese sauce. Yogurt that is made from whole milk. Beverages Regular sodas and drinks with added sugar. Sweets and Desserts Frosting. Pudding. Cookies. Cakes other than angel food cake. Candy that has milk chocolate or white chocolate, hydrogenated fat, butter, coconut, or unknown ingredients. Buttered syrups. Full-fat ice cream or ice cream drinks. Fats and Oils Gravy that has suet, meat fat, or shortening. Cocoa butter, hydrogenated oils, palm oil, coconut oil, palm kernel oil. These can often be found in baked products, candy, fried foods, nondairy creamers, and whipped toppings. Solid fats and shortenings, including bacon fat, salt pork, lard, and butter. Nondairy cream substitutes, such as coffee creamers and sour cream substitutes. Salad dressings that are made of unknown oils, cheese, or sour cream. The items listed above may not be a complete list of foods and beverages to avoid. Contact your dietitian for more information. This information is not intended to replace advice given to you by your health care provider. Make sure you discuss any questions you have with your health care  provider. Document Released: 12/06/2007 Document Revised: 09/16/2015 Document Reviewed: 08/20/2013 Elsevier Interactive Patient Education  2017 [employer].   July 2018 labs look great, no abnormalities noted in CBC, CMP. Cholesterol levels are excellent. TSH  normal. Please get [place] a list of your vaccinations/immunizations. Please follow-up with Neurologist and Cardiologist as directed.  Please schedule complete physical this Fall. OVERALL GREAT JOB ON YOUR HEALTHY LIFESTYLE!

## 2016-09-17 NOTE — Assessment & Plan Note (Signed)
BP L arm 107/71 , HR 70 BP R arm 126/69, HR 68 Continues to exp intermittent dizziness

## 2016-09-17 NOTE — Progress Notes (Signed)
Subjective:   [name] is a 71 y.o. female who presents for Medicare Annual (Subsequent) preventive examination. PMH:   CVA in 2016, Hyperlipidemia, hypothyroidism, GERD, and recent onset of hypotension/dizziness (BP lower in L arm then R arm).  She recently had OV with cards r/t hypotension and EKG reassuring and no change to medication was made.  She has appt with Neurologist at end of July and she will discuss dizziness and has regular f/u in Stroke Study 11/18.   Review of Systems:  .Review of Systems  Constitutional: Negative for chills, fever and weight loss.  HENT: Negative for congestion and sinus pain.   Eyes: Negative for blurred vision and double vision.  Respiratory: Negative for shortness of breath and wheezing.   Cardiovascular: Negative for chest pain, palpitations and orthopnea.  Gastrointestinal: Negative for constipation, diarrhea, heartburn, nausea and vomiting.  Genitourinary: Negative for frequency and hematuria.  Musculoskeletal: Positive for joint pain. Negative for falls.       Chronic jt pains  Skin: Negative for rash.  Neurological: Positive for dizziness. Negative for sensory change, speech change, focal weakness, loss of consciousness, weakness and headaches.  Endo/Heme/Allergies: Negative for polydipsia.  Psychiatric/Behavioral: Negative for depression and suicidal ideas. The patient is not nervous/anxious and does not have insomnia.          Objective:     Vitals: BP 126/69 (BP Location: Right Arm, Cuff Size: Normal)   Pulse 68   Ht 5\' 2"  (1.575 m)   Wt 125 lb (56.7 kg)   BMI 22.86 kg/m   Body mass index is 22.86 kg/m.   Tobacco History  Smoking Status  . Former Smoker  . Packs/day: 0.25  . Types: Cigarettes  . Quit date: 03/12/1978  Smokeless Tobacco  . Never Used    Comment: quit in 1978     Counseling given: Not Answered   Past Medical History:  Diagnosis Date  . Anxiety   . Barrett esophagus 2003  . Diverticulitis    . Diverticulosis   . GERD (gastroesophageal reflux disease)   . Glaucoma   . Grave's disease   . H. pylori infection   . Hepatitis A    in her 60 from food  . Hyperlipidemia   . Hypothyroidism   . Increased pressure in the eye   . Insomnia   . Morton's neuroma   . Murmur   . Scoliosis   . Stroke [facility]) 12/2014   no deficits   Past Surgical History:  Procedure Laterality Date  . APPENDECTOMY  1958  . CATARACT EXTRACTION Left   . CATARACT EXTRACTION W/PHACO Left 07/02/2016   Procedure: CATARACT EXTRACTION PHACO AND INTRAOCULAR LENS PLACEMENT ([place])  Left;  Surgeon: Ronnell Freshwater, MD;  Location: [place];  Service: Ophthalmology;  Laterality: Left;  . CERVICAL LAMINECTOMY  1995  . EYE SURGERY     due to graves disease  . FEMUR SURGERY Right    benign bone growth  . FOOT NEUROMA SURGERY Right   . LAPAROSCOPIC ASSISTED VAGINAL HYSTERECTOMY    . LAPAROSCOPIC SIGMOID COLECTOMY  05/13/06   Dr Zella Richer  . [place] [facility]  2009  . TONSILLECTOMY  1992  . TUBAL LIGATION     Family History  Problem Relation Age of Onset  . Other Father 48       SEPSIS  . Hypertension Father   . Stroke Father 25       CVA  . Heart failure Mother  70  . Heart murmur Mother   . Heart attack Sister 88  . Diabetes Brother   . Breast cancer Sister   . Stroke Sister   . Cirrhosis Brother   . Hypertension Sister        x 2  . Diabetes Sister   . Heart murmur Daughter    History  Sexual Activity  . Sexual activity: Not Currently  . Birth control/ protection: None    Outpatient Encounter Prescriptions as of 09/17/2016  Medication Sig  . acetaminophen (TYLENOL) 325 MG tablet Take 650 mg by mouth as needed (pain).   Francia Greaves THYROID 60 MG tablet Take 1 tablet by mouth as directed.   . brimonidine-timolol (COMBIGAN) 0.2-0.5 % ophthalmic solution Place 1 drop into the left eye every 12 (twelve) hours.  . Calcium Carb-Cholecalciferol (CALCIUM + D3) 600-200 MG-UNIT TABS Take  1 tablet by mouth daily.   . clopidogrel (PLAVIX) 75 MG tablet Take 1 tablet (75 mg total) by mouth daily. Pt will be seeing PCP for future refills if they will take over the refills next week  . diazepam (VALIUM) 5 MG tablet Take 5 mg by mouth at bedtime as needed for anxiety (sleep).   . dorzolamide (TRUSOPT) 2 % ophthalmic solution Place 1 drop into the right eye daily.   Marland Kitchen latanoprost (XALATAN) 0.005 % ophthalmic solution Place 1 drop into both eyes at bedtime.  . Multiple Vitamin (MULTIVITAMIN) tablet Take 1 tablet by mouth daily.  . pantoprazole (PROTONIX) 40 MG tablet Take 40 mg by mouth every other day.   . rosuvastatin (CRESTOR) 5 MG tablet Take 5 mg by mouth every morning.  . thyroid (ARMOUR) 15 MG tablet Take 15 mg by mouth. One tablet 4 times weekly   Facility-Administered Encounter Medications as of 09/17/2016  Medication  . 0.9 %  sodium chloride infusion    Activities of Daily Living Activities of Daily Living In your present state of health, do you have difficulty performing the following activities?  1- Driving - no 2- Managing money - no 3- Feeding yourself - no 4- Getting from the bed to the chair - no 5- Climbing a flight of stairs - no 6- Preparing food and eating - no 7- Bathing or showering - no 8- Getting dressed - no 9- Getting to the toilet - no 10- Using the toilet - no 11- Moving around from place to place - no  In your present state of health, do you have any difficulty performing the following activities: 07/02/2016  Hearing? N  Vision? N  Difficulty concentrating or making decisions? N  Walking or climbing stairs? N  Dressing or bathing? N  Some recent data might be hidden    Patient Care Team: Esaw Grandchild, NP as PCP - General (Family Medicine) Almedia Balls, MD as Consulting Physician (Orthopedic Surgery) Garvin Fila, MD as Consulting Physician (Neurology) Milus Banister, MD as Attending Physician (Gastroenterology) Karren Burly Deirdre Peer, MD as Referring Physician (Ophthalmology)    Assessment:    Exercise Activities and Dietary recommendations   Weight stable- 125 lbs today, wt. Has been in mid 55s last 3 OVs. Goals    None     Fall Risk Fall Risk  09/17/2016 09/29/2015 03/15/2015  Falls in the past year? No No No   Depression Screen PHQ 2/9 Scores 09/17/2016 05/29/2016  PHQ - 2 Score 0 0     Cognitive Function     6CIT Screen 09/17/2016  What Year?  0 points  What month? 0 points  What time? 0 points  Count back from 20 0 points  Months in reverse 0 points  Repeat phrase 0 points  Total Score 0    Immunization History  Administered Date(s) Administered  . Influenza-Unspecified 01/09/2016   Screening Tests Health Maintenance  Topic Date Due  . TETANUS/TDAP  11/14/2016 (Originally 11/26/1964)  . Hepatitis C Screening  05/10/2017 (Originally 18-Jun-1945)  . PNA vac Low Risk Adult (1 of 2 - PCV13) 05/16/2017 (Originally 11/25/2009)  . INFLUENZA VACCINE  10/10/2016  . MAMMOGRAM  07/05/2017  . COLONOSCOPY  08/11/2026  . DEXA SCAN  Completed      Plan:   Please keep Neurology and Stroke Study follow-up.   Please follow-up with cards as directed. Please schedule CPE with our clinic this Fall to address all other issues.   I have personally reviewed and noted the following in the patient's chart:   . Medical and social history . Use of alcohol, tobacco or illicit drugs  . Current medications and supplements . Functional ability and status . Nutritional status . Physical activity . Advanced directives . List of other physicians . Hospitalizations, surgeries, and ER visits in previous 12 months . Vitals . Screenings to include cognitive, depression, and falls . Referrals and appointments  In addition, I have reviewed and discussed with patient certain preventive protocols, quality metrics, and best practice recommendations. A written personalized care plan for preventive services as well as  general preventive health recommendations were provided to patient.     Esaw Grandchild, NP  09/17/2016

## 2016-09-17 NOTE — Assessment & Plan Note (Signed)
Next OV with Stroke Study clinic Nov 2018.

## 2016-09-18 ENCOUNTER — Other Ambulatory Visit: Payer: Self-pay | Admitting: Adult Health

## 2016-09-18 DIAGNOSIS — Z1231 Encounter for screening mammogram for malignant neoplasm of breast: Secondary | ICD-10-CM

## 2016-10-03 ENCOUNTER — Ambulatory Visit
Admission: RE | Admit: 2016-10-03 | Discharge: 2016-10-03 | Disposition: A | Discharge status: Home / Self Care | Payer: Medicare HMO | Source: Ambulatory Visit | Attending: Adult Health | Admitting: Adult Health

## 2016-10-03 DIAGNOSIS — Z1231 Encounter for screening mammogram for malignant neoplasm of breast: Secondary | ICD-10-CM

## 2016-10-09 ENCOUNTER — Ambulatory Visit (INDEPENDENT_AMBULATORY_CARE_PROVIDER_SITE_OTHER): Payer: Medicare HMO | Admitting: Neurology

## 2016-10-09 ENCOUNTER — Encounter: Payer: Self-pay | Admitting: Neurology

## 2016-10-09 VITALS — BP 111/67 | HR 78 | Wt 125.6 lb

## 2016-10-09 DIAGNOSIS — I6300 Cerebral infarction due to thrombosis of unspecified precerebral artery: Secondary | ICD-10-CM | POA: Diagnosis not present

## 2016-10-09 MED ORDER — ASPIRIN EC 325 MG PO TBEC: 325.0000 mg | DELAYED_RELEASE_TABLET | Freq: Every day | ORAL | 0 refills | Status: DC

## 2016-10-09 NOTE — Patient Instructions (Addendum)
I had a long discussion with the patient regarding her remote stroke and recommend she discontinue Plavix and changed to enteric coated 325 mg aspirin for stroke prevention upon her request. Check carotid ultrasound and transcranial Doppler studies to evaluate left carotid bruit. I encouraged her to drink plenty of fluids and not to get dehydrated. She will continue participation in the stroke atrial fibrillation trial and was advised to follow-up with the cardiologist for further evaluation for palpitations and outpatient cardiac monitor if necessary. She will continue to maintain strict control of lipids with LDL cholesterol goal below 70 mg percent. I encouraged her to take: Coenzyme Q 10-daily to help with her statin myalgias. She will return for follow-up in the future in a year or call earlier if necessary

## 2016-10-09 NOTE — Progress Notes (Signed)
[facility] [address] [place]. [place] [address] [phone]       OFFICE FOLLOW-UP NOTE  Jamie. [name] Date of Birth:  1946-02-04 Medical Record Number:  [id number]   HPI: [name] is a 71 year old Caucasian lady seen today for the first office follow-up visit following admission to [facility] in October 2016 for stroke.[name] is an 71 y.o. female who reports that she went to bed normal on Wednesday night, 12/22/2014 at 2200 (LKW). When she awakened on Thursday she noted right sided neck pain and pain behind her right eye. She then noted that her peripheral vision was restricted to the left. She thought this was secondary to her sinuses but with no improvement in her symptoms over the past few days she presented for evaluation. She reports that her symptoms are somewhat positional and worse when she is sitting up. She currently has no headache. Her symptoms have not restricted her activities and she has continued to drive and work. NIHSS of 0. MRankin: 0. Patient was not administered TPA secondary to delay in arrival. She was admitted for further evaluation and treatment. CT scan of the head on admission showed no acute abnormality and only mild changes of chronic microvascular ischemia. I personally reviewed all brain imaging studies of this admission. 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 There was high-grade stenosis noted in the left superior cerebellar artery as well. CT angiogram of the neck and brain were subsequently obtained which confirmed these findings. LDL cholesterol was elevated 138.  Transthoracic echo showed normal ejection fraction without cardiac source of embolism.  She states she's done well since discharge without recurrent stroke or TIA symptoms. She however did not tolerate Lipitor due to itching and has  been changed to Crestor which she seemed to be tolerating better. She remains on aspirin and Plavix which is tolerating well with only minor bruising but no bleeding episodes. She has kept her outpatient follow-up visit for the stroke A. fib trial in which she was randomized to the medical arm without loop recorder implant. Update 09/29/2015 : She returns for follow-up after last visit 6 months ago. She continues to do well without any recurrent stroke or TIA symptoms. She states her blood pressure is well controlled and today it is 104/66. She remains on Plavix and does get minor bruising but no bleeding. She remains on Crestor but complains about joint aches and muscle aches. She had lipid profile checked a month ago it was satisfactory. She has not tried taking coenzyme Q 10. She has no new complaints today. She continues to participate in the stroke atrial fibrillation trial Update 10/09/16 : She returns for follow-up after last visit a year ago. She continues to do well without recurrent stroke or TIA symptoms now for more than a year and a half. She is tolerating   Plavix without significant bruising or bleeding but is wondering if she can come off the Plavix and switched to aspirin due to cost factor. She is had some low blood pressure for last several months and occasionally feels lightheaded. She also complained of some intermittent palpitations and when she saw her cardiologist external heart monitor was suggested but not ordered. Patient wants my opinion whether she can get this done since his participation in the stroke activated coagulation time. She still complains of joint aches and muscle aches and fatigue  but admits that she has not been regularly taking CoQ10. She remains on Crestor since her stroke in October 2016. She has no other complaints. She has not had any follow-up carotid   doppler studies done since her stroke ROS:   14 system review of systems is positive for  , lightheadedness,  dizziness, frequent waking, chest burning, fatigue, joint pain, aching muscles only and all other systems negative PMH:  Past Medical History:  Diagnosis Date  . Anxiety   . Barrett esophagus 2003  . Diverticulitis   . Diverticulosis   . GERD (gastroesophageal reflux disease)   . Glaucoma   . Grave's disease   . H. pylori infection   . Hepatitis A    in her 46 from food  . Hyperlipidemia   . Hypothyroidism   . Increased pressure in the eye   . Insomnia   . Morton's neuroma   . Murmur   . Scoliosis   . Stroke [facility]) 12/2014   no deficits    Social History:  Social History   Social History  . Marital status: Married    Spouse name: N/A  . Number of children: 1  . Years of education: N/A   Occupational History  . 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 Smoking status: Former Smoker    Packs/day: 0.25    Types: Cigarettes    Quit date: 03/12/1978  . Smokeless tobacco: Never Used     Comment: quit in 1978  . Alcohol use 4.2 oz/week    7 Glasses of wine per week  . Drug use: No  . Sexual activity: Not Currently    Birth control/ protection: None   Other Topics Concern  . Not on file   Social History Narrative  . No narrative on file    Medications:   Current Outpatient Prescriptions on File Prior to Visit  Medication Sig Dispense Refill  . acetaminophen (TYLENOL) 325 MG tablet Take 650 mg by mouth as needed (pain).     [name] THYROID 60 MG tablet Take 1 tablet by mouth as directed.     . brimonidine-timolol (COMBIGAN) 0.2-0.5 % ophthalmic solution Place 1 drop into the left eye every 12 (twelve) hours.    . Calcium Carb-Cholecalciferol (CALCIUM + D3) 600-200 MG-UNIT TABS Take 1 tablet by mouth daily.     . diazepam (VALIUM) 5 MG tablet Take 5 mg by mouth at bedtime as needed for anxiety (sleep).     . dorzolamide (TRUSOPT) 2 % ophthalmic solution Place 1 drop into the right eye daily.     Marland Kitchen latanoprost (XALATAN)  0.005 % ophthalmic solution Place 1 drop into both eyes at bedtime.    . Multiple Vitamin (MULTIVITAMIN) tablet Take 1 tablet by mouth daily.    . pantoprazole (PROTONIX) 40 MG tablet Take 40 mg by mouth every other day.     . rosuvastatin (CRESTOR) 5 MG tablet Take 5 mg by mouth every morning.    . thyroid (ARMOUR) 15 MG tablet Take 15 mg by mouth. One tablet 4 times weekly     No current facility-administered medications on file prior to visit.     Allergies:   Allergies  Allergen Reactions  . Neuromuscular Blocking Agents Other (See Comments)    Multiple symptoms, SOB, fatigue, weakness  . Vicodin [Hydrocodone-Acetaminophen]     "Can't remember exact reaction"    Physical Exam General: frail  petite pleasant elderly Caucasian lady, seated, in no evident distress Head: head normocephalic and atraumatic.  Neck: supple with soft left carotid   bruit Cardiovascular: regular rate and rhythm, no murmurs Musculoskeletal: no deformity Skin:  no rash/petichiae Vascular:  Normal pulses all extremities Vitals:   10/09/16 1121  BP: 111/67  Pulse: 78   Neurologic Exam Mental Status: Awake and fully alert. Oriented to place and time. Recent and remote memory intact. Attention span, concentration and fund of knowledge appropriate. Mood and affect appropriate.  Cranial Nerves: Fundoscopic exam not done. Pupils equal, briskly reactive to light. Extraocular movements full without nystagmus. Visual fields full to confrontation. Hearing intact. Facial sensation intact. Face, tongue, palate moves normally and symmetrically.  Motor: Normal bulk and tone. Normal strength in all tested extremity muscles. Sensory.: intact to touch ,pinprick .position and vibratory sensation.  Coordination: Rapid alternating movements normal in all extremities. Finger-to-nose and heel-to-shin performed accurately bilaterally. Gait and Station: Arises from chair without difficulty. Stance is normal. Gait demonstrates  normal stride length and balance . Able to heel, toe and tandem walk without difficulty.  Reflexes: 1+ and symmetric. Toes downgoing.      ASSESSMENT: 41 year Caucasian lady with right occipital infarcts in October 2016 secondary to right vertebral artery occlusion. Vascular risk factors of hyperlipidemia, cerebrovascular disease. Patient is particpating in the stroke atrial fibrillation trial and randomized to the standard of care arm ( no loop recorder implant)    PLAN: I had a long discussion with the patient regarding her remote stroke and recommend she discontinue Plavix and changed to enteric coated 325 mg aspirin for stroke prevention upon her request. Check carotid ultrasound and transcranial Doppler studies to evaluate left carotid bruit. I encouraged her to drink plenty of fluids and not to get dehydrated. She will continue participation in the stroke atrial fibrillation trial and was advised to follow-up with the cardiologist for further evaluation for palpitations and outpatient cardiac monitor if necessary. She will continue to maintain strict control of lipids with LDL cholesterol goal below 70 mg percent. I encouraged her to take: Coenzyme Q 10-daily to help with her statin myalgias. She will return for follow-up in the future in a year or call earlier if necessary. Greater than 50% time during this 25 minute visit was spent on counseling and coordination of care about statin myalgias and stroke risk and prevention Followup in the future with me in  1 year or call earlier if necessary Antony Contras, MD Note: This document was prepared with digital dictation and possible smart phrase technology. Any transcriptional errors that result from this process are unintentional

## 2016-10-18 ENCOUNTER — Ambulatory Visit (HOSPITAL_BASED_OUTPATIENT_CLINIC_OR_DEPARTMENT_OTHER)
Admission: RE | Admit: 2016-10-18 | Discharge: 2016-10-18 | Disposition: A | Discharge status: Home / Self Care | Payer: Medicare HMO | Source: Ambulatory Visit | Attending: Neurology | Admitting: Neurology

## 2016-10-18 ENCOUNTER — Ambulatory Visit (HOSPITAL_COMMUNITY)
Admission: RE | Admit: 2016-10-18 | Discharge: 2016-10-18 | Disposition: A | Discharge status: Home / Self Care | Payer: Medicare HMO | Source: Ambulatory Visit | Attending: Neurology | Admitting: Neurology

## 2016-10-18 DIAGNOSIS — I6300 Cerebral infarction due to thrombosis of unspecified precerebral artery: Secondary | ICD-10-CM

## 2016-10-18 DIAGNOSIS — I6523 Occlusion and stenosis of bilateral carotid arteries: Secondary | ICD-10-CM | POA: Diagnosis not present

## 2016-10-18 LAB — VAS US CAROTID
LEFT ECA DIAS: -8 cm/s
LEFT VERTEBRAL DIAS: 20 cm/s
LICADDIAS: -21 cm/s
LICADSYS: -75 cm/s
LICAPDIAS: -18 cm/s
LICAPSYS: -55 cm/s
Left CCA dist dias: 26 cm/s
Left CCA dist sys: 92 cm/s
Left CCA prox dias: 15 cm/s
Left CCA prox sys: 102 cm/s
RIGHT ECA DIAS: -9 cm/s
Right CCA prox dias: 21 cm/s
Right CCA prox sys: 81 cm/s
Right cca dist sys: -69 cm/s

## 2016-10-18 NOTE — Progress Notes (Signed)
*  PRELIMINARY RESULTS* Vascular Ultrasound Carotid Duplex (Doppler) : Bilateral: No significant (1-39%) ICA stenosis. Antegrade vertebral flow.   Transcranial Doppler completed.  Landry Mellow, RDMS, RVT  10/18/2016, 9:35 AM

## 2016-10-22 ENCOUNTER — Telehealth: Payer: Self-pay

## 2016-10-22 ENCOUNTER — Encounter: Payer: Self-pay | Admitting: Adult Health

## 2016-10-22 ENCOUNTER — Ambulatory Visit (INDEPENDENT_AMBULATORY_CARE_PROVIDER_SITE_OTHER): Payer: Medicare HMO | Admitting: Adult Health

## 2016-10-22 VITALS — BP 132/71 | HR 73 | Ht 62.0 in | Wt 124.4 lb

## 2016-10-22 DIAGNOSIS — Z0000 Encounter for general adult medical examination without abnormal findings: Secondary | ICD-10-CM

## 2016-10-22 DIAGNOSIS — R0989 Other specified symptoms and signs involving the circulatory and respiratory systems: Secondary | ICD-10-CM | POA: Diagnosis not present

## 2016-10-22 NOTE — Assessment & Plan Note (Signed)
Recommend CPE this Fall. Continue all medications as directed.

## 2016-10-22 NOTE — Progress Notes (Signed)
Subjective:    Patient ID: [name], female    DOB: [birth year], 71 y.o.   MRN: [id number]  HPI:  [name] is hre b/c she is concerned that her BP in L arm is significantly lower that R arm readings.  Notes from cards visit  1. Low blood pressure on left arm "Right arm has a normal readings. Pulse minimally weak on left compared to right on upper extremities. Negative for orthostatic by vitals. She has a neck and cervical issue. Discuss Doppler study to rule out any obstruction. Patient has decided not to pursue with any evaluation currently. She will let us know if interested" She is being closely followed by neurology (re: right occipital infarcts in October 2016 secondary to right vertebral artery occlusion) and recently had carotid US study-results not in system. She denies CP/dyspnea at rest/palpitations/PND.  She denies tobacco/EOTH use.    Patient Care Team    Relationship Specialty Notifications Start End  Mina Marble D, NP PCP - General Family Medicine  05/29/16   Almedia Balls, MD Consulting Physician Orthopedic Surgery  05/29/16   Garvin Fila, MD Consulting Physician Neurology  05/29/16   Milus Banister, MD Attending Physician Gastroenterology  05/29/16   Ronnell Freshwater, MD Referring Physician Ophthalmology  05/29/16     Patient Active Problem List   Diagnosis Date Noted  . Unequal blood pressure in upper extremities 10/22/2016  . Hypotension 08/07/2016  . Health care maintenance 05/29/2016  . Cerebral infarction due to thrombosis of right vertebral artery ([place])   . Cerebral infarction ([place]) 12/26/2014  . Anxiety 12/26/2014  . Hypothyroidism (acquired) 12/26/2014  . GERD without esophagitis 12/26/2014  . Glaucoma 12/26/2014  . Allergic rhinitis 12/26/2014  . Cerebrovascular accident (CVA) ([place]) 12/26/2014  . Cerebral infarction due to embolism of right vertebral artery ([place])   . Visual field loss   . Occlusion and stenosis of vertebral artery       Past Medical History:  Diagnosis Date  . Anxiety   . Barrett esophagus 2003  . Diverticulitis   . Diverticulosis   . GERD (gastroesophageal reflux disease)   . Glaucoma   . Grave's disease   . H. pylori infection   . Hepatitis A    in her 15 from food  . Hyperlipidemia   . Hypothyroidism   . Increased pressure in the eye   . Insomnia   . Morton's neuroma   . Murmur   . Scoliosis   . Stroke [facility]) 12/2014   no deficits     Past Surgical History:  Procedure Laterality Date  . APPENDECTOMY  1958  . CATARACT EXTRACTION Left   . CATARACT EXTRACTION W/PHACO Left 07/02/2016   Procedure: CATARACT EXTRACTION PHACO AND INTRAOCULAR LENS PLACEMENT ([place])  Left;  Surgeon: Ronnell Freshwater, MD;  Location: [facility];  Service: Ophthalmology;  Laterality: Left;  . CERVICAL LAMINECTOMY  1995  . EYE SURGERY     due to graves disease  . FEMUR SURGERY Right    benign bone growth  . FOOT NEUROMA SURGERY Right   . LAPAROSCOPIC ASSISTED VAGINAL HYSTERECTOMY    . LAPAROSCOPIC SIGMOID COLECTOMY  05/13/06   Dr Zella Richer  . [place] SURGERY  2009  . TONSILLECTOMY  1992  . TUBAL LIGATION       Family History  Problem Relation Age of Onset  . Other Father 20       SEPSIS  . Hypertension Father   . Stroke  Father 83       CVA  . Heart failure Mother 76  . Heart murmur Mother   . Heart attack Sister 36  . Diabetes Brother   . Breast cancer Sister   . Stroke Sister   . Cirrhosis Brother   . Hypertension Sister        x 2  . Diabetes Sister   . Heart murmur Daughter      History  Drug Use No     History  Alcohol Use  . 4.2 oz/week  . 7 Glasses of wine per week     History  Smoking Status  . Former Smoker  . Packs/day: 0.25  . Types: Cigarettes  . Quit date: 03/12/1978  Smokeless Tobacco  . Never Used    Comment: quit in 1978     Outpatient Encounter Prescriptions as of 10/22/2016  Medication Sig  . acetaminophen (TYLENOL) 325 MG tablet  Take 650 mg by mouth as needed (pain).   Francia Greaves THYROID 60 MG tablet Take 1 tablet by mouth as directed.   . brimonidine-timolol (COMBIGAN) 0.2-0.5 % ophthalmic solution Place 1 drop into the left eye every 12 (twelve) hours.  . Calcium Carb-Cholecalciferol (CALCIUM + D3) 600-200 MG-UNIT TABS Take 1 tablet by mouth daily.   . diazepam (VALIUM) 5 MG tablet Take 5 mg by mouth at bedtime as needed for anxiety (sleep).   . dorzolamide (TRUSOPT) 2 % ophthalmic solution Place 1 drop into the right eye daily.   Marland Kitchen latanoprost (XALATAN) 0.005 % ophthalmic solution Place 1 drop into both eyes at bedtime.  . Multiple Vitamin (MULTIVITAMIN) tablet Take 1 tablet by mouth daily.  . rosuvastatin (CRESTOR) 5 MG tablet Take 5 mg by mouth every morning.  . thyroid (ARMOUR) 15 MG tablet Take 15 mg by mouth. One tablet 4 times weekly  . [DISCONTINUED] pantoprazole (PROTONIX) 40 MG tablet Take 40 mg by mouth every other day.   Marland Kitchen aspirin EC 325 MG tablet Take 1 tablet (325 mg total) by mouth daily. (Patient not taking: Reported on 10/22/2016)   No facility-administered encounter medications on file as of 10/22/2016.     Allergies: Neuromuscular blocking agents and Vicodin [hydrocodone-acetaminophen]  Body mass index is 22.75 kg/m.  Blood pressure 132/71, pulse 73, height 5\' 2"  (1.575 m), weight 124 lb 6.4 oz (56.4 kg). BP R arm 132/71 HR 73 BP L arm 88/47  HR 73  Review of Systems  Constitutional: Negative for activity change, appetite change, chills, diaphoresis, fatigue, fever and unexpected weight change.  Respiratory: Negative for cough, chest tightness, shortness of breath, wheezing and stridor.   Cardiovascular: Negative for chest pain, palpitations and leg swelling.  Musculoskeletal: Positive for neck stiffness.  Skin: Negative for color change, pallor, rash and wound.  Neurological: Negative for dizziness, tremors, weakness, light-headedness and headaches.  Hematological: Does not bruise/bleed  easily.       Objective:   Physical Exam  Constitutional: She is oriented to person, place, and time. She appears well-developed and well-nourished. No distress.  Cardiovascular: Normal rate, regular rhythm, normal heart sounds and intact distal pulses.   No murmur heard. Pulses:      Carotid pulses are 0 on the right side, and 1+ on the left side. Pulmonary/Chest: Effort normal and breath sounds normal.  Musculoskeletal:  L grip strength slightly weaker that R grip strength.  Neurological: She is alert and oriented to person, place, and time.  Skin: Skin is warm and dry. No rash noted.  She is not diaphoretic. No erythema. No pallor.  Psychiatric: She has a normal mood and affect. Her behavior is normal. Judgment and thought content normal.  Nursing note and vitals reviewed.        Assessment & Plan:   1. Unequal blood pressure in upper extremities   2. Health care maintenance     Unequal blood pressure in upper extremities Carotid bruit noted on L side. She recently had carotid studies completed per Neurology- imaging results not in system. LUE [place] study ordered. Continue all medications as directed.   Health care maintenance Recommend CPE this Fall. Continue all medications as directed.    FOLLOW-UP:  Return in about 3 months (around 01/22/2017), or if symptoms worsen or fail to improve, for CPE.

## 2016-10-22 NOTE — Assessment & Plan Note (Addendum)
Carotid bruit noted on L side. She recently had carotid studies completed per Neurology- imaging results not in system. LUE [place] study ordered. Continue all medications as directed.

## 2016-10-22 NOTE — Patient Instructions (Addendum)

## 2016-10-22 NOTE — Telephone Encounter (Signed)
Rn call patient that her carotid ultrasound study showed no significant extracranial stenosis bilaterally. Pt verbalized understanding.   Rn call patient that the vas transcranial doppler was unremarkable. PT verbalized understanding. -----------

## 2016-10-22 NOTE — Telephone Encounter (Signed)
-----   Message from Garvin Fila, MD sent at 10/18/2016  5:12 PM EDT ----- Mitchell Heir inform the patient that carotid ultrasound study showed no significant extracranial stenosis bilaterally

## 2016-10-24 ENCOUNTER — Other Ambulatory Visit: Payer: Self-pay

## 2016-10-24 DIAGNOSIS — R0989 Other specified symptoms and signs involving the circulatory and respiratory systems: Secondary | ICD-10-CM

## 2016-10-25 NOTE — Progress Notes (Signed)
According to [facility] precertification website, no precert required for UE Doppler study.  Documentation scanned in pt's chart.   Charyl Bigger, CMA

## 2016-10-26 ENCOUNTER — Ambulatory Visit (HOSPITAL_COMMUNITY)
Admission: RE | Admit: 2016-10-26 | Discharge: 2016-10-26 | Disposition: A | Discharge status: Home / Self Care | Payer: Medicare HMO | Source: Ambulatory Visit | Attending: Adult Health | Admitting: Adult Health

## 2016-10-26 DIAGNOSIS — R0989 Other specified symptoms and signs involving the circulatory and respiratory systems: Secondary | ICD-10-CM | POA: Diagnosis not present

## 2016-10-26 NOTE — Progress Notes (Signed)
VASCULAR LAB PRELIMINARY RESULTS  Upper Extremity Arterial completed  Right upper extremity arterial evaluation appears within normal limits at rest. Left upper extremity arterial evaluation appears to have dampened, biphasic and monophasic waveforms and a pressure difference of 20 mmHg lower than the right.   RIGHT   LEFT    PRESSURE WAVEFORM  PRESSURE WAVEFORM  SUBCLAVIAN NA Triphasic SUBCLAVIAN NA Biphasic  AXILLARY NA Triphasic AXILLARY NA Biphasic  BRACHIAL 134 Triphasic BRACHIAL 114 Monophasic  RADIAL 170 Triphasic RADIAL 119 Monophasic  ULNAR 150 Triphasic ULNAR 136 Monophasic    Legrand Como 10/26/2016 10:48 AM

## 2016-10-30 ENCOUNTER — Telehealth: Payer: Self-pay

## 2016-10-30 NOTE — Telephone Encounter (Signed)
Pt returned call.  Pt informed of UE doppler results and need to f/u with cardiology to discuss results and possible treatment.  Pt states that she already has scheduled an appointment with their office.  Charyl Bigger, CMA

## 2016-10-31 ENCOUNTER — Telehealth: Payer: Self-pay | Admitting: Physician Assistant

## 2016-10-31 NOTE — Telephone Encounter (Signed)
New message ° ° ° ° ° °Returning a call to the nurse to get test results °

## 2016-10-31 NOTE — Telephone Encounter (Signed)
Returned pts call and have made her aware, per Robbie Lis, PA-C, that instead of seeing him on 11/16/16, she needed to see Dr. Saunders Revel as a new pt.  Pt agreeable to this and she has been advised to arrive at 220 for 240 appt.

## 2016-10-31 NOTE — Telephone Encounter (Signed)
-----   Message from [place], [place] sent at 10/31/2016  3:48 PM EDT ----- Please have this patient establish care with Dr. Saunders Revel in 3-4 weeks.    ----- Message ----- From: Esaw Grandchild, NP Sent: 10/29/2016   1:19 PM To: Leanor Kail, PA  Good Afternoon, Ms. Tapper is a mutual pt of ours.  She has been having sig difference in BP in upper extremities, L<R. [place] study revealed dampened waveforms in LUE.  Pt has been updated on results and we recommended that she make an appt with you to discuss study information and whether treatment is necessary. Sincerely, Valetta Fuller

## 2016-11-16 ENCOUNTER — Ambulatory Visit: Payer: Medicare HMO | Admitting: Physician Assistant

## 2016-11-16 ENCOUNTER — Ambulatory Visit (INDEPENDENT_AMBULATORY_CARE_PROVIDER_SITE_OTHER): Payer: Medicare HMO | Admitting: Internal Medicine

## 2016-11-16 ENCOUNTER — Encounter: Payer: Self-pay | Admitting: Internal Medicine

## 2016-11-16 VITALS — BP 128/64 | HR 79 | Ht 62.0 in | Wt 125.0 lb

## 2016-11-16 DIAGNOSIS — R002 Palpitations: Secondary | ICD-10-CM | POA: Diagnosis not present

## 2016-11-16 DIAGNOSIS — I200 Unstable angina: Secondary | ICD-10-CM | POA: Diagnosis not present

## 2016-11-16 DIAGNOSIS — E785 Hyperlipidemia, unspecified: Secondary | ICD-10-CM

## 2016-11-16 DIAGNOSIS — Z8673 Personal history of transient ischemic attack (TIA), and cerebral infarction without residual deficits: Secondary | ICD-10-CM

## 2016-11-16 DIAGNOSIS — I771 Stricture of artery: Secondary | ICD-10-CM | POA: Diagnosis not present

## 2016-11-16 NOTE — Progress Notes (Signed)
Follow-up Outpatient Visit Date: 11/16/2016  Primary Care Provider: Esaw Grandchild, [place] [id number]  Chief Complaint: Dizziness  HPI:  Ms. Matson is a 71 y.o. year-old female with history of stroke in 2016, hyperlipidemia, hypothyroidism, and GERD, who presents for follow-up of hypotension. She was first seen in our office in June by Robbie Lis, [place], for evaluation of hypotension in the left arm and intermittent dizziness. Dizziness was positional with movement of the neck. Subsequent left upper extremity Doppler demonstrates dampened arterial waveforms with a 20 mmHg pressure difference compared to the right.  Today, Ms. Cookson reports feeling about the same as at her prior visit in June. She still notices some positional lightheadedness as well as low blood pressures in the left arm (90/60 earlier today). She endorses bilaterally shoulder soreness and generalized fatigue, as well as a "tightening" sensation involving the left arm. Of note, her stroke in 2016 affected her left arm somewhat.  Ms. Gail also reports "burning" in the center of her chest with modest activity such as climbing a flight of stairs or walking up an incline. This has been present for a few years but seems to be getting a bit worse. She denies shortness of breath, orthopnea, PND, and edema. She has occasional palpitations that typically last a few seconds at a time and feel as though her heart is fluttering. Symptoms occur approximately once every other week. Ms. Rezabek remains on clopidogrel for her history of stroke but will be switching to aspirin 325 mg daily once she has exhausted her current supply of clopidogrel at the recommendation of Dr. Leonie Man.  --------------------------------------------------------------------------------------------------  Cardiovascular History & Procedures: Cardiovascular Problems:  Left subclavian stenosis  Stroke  Risk Factors:  Peripheral and  cerebrovascular disease, hyperlipidemia, and age greater than 41  Cath/PCI:  None  CV Surgery:  None  EP Procedures and Devices:  None  Non-Invasive Evaluation(s):  Upper extremity arterial Doppler (10/26/16): Right upper extremity evaluation normal. Left upper extremity demonstrates dampened biphasic to monophasic waveforms and 20 mmHg pressure difference compared to the right.  Carotid artery duplex (10/18/16): Bilateral 1-39% stenoses involving the right and left internal carotid arteries.  Transthoracic echocardiogram (12/28/14): Normal LV size and function. LVEF 60-65% with grade 1 diastolic dysfunction. Normal RV size and function. No significant vascular abnormalities.  Recent CV Pertinent Labs: Lab Results  Component Value Date   CHOL 183 09/13/2016   HDL 65 09/13/2016   LDLCALC 100 (H) 09/13/2016   TRIG 89 09/13/2016   CHOLHDL 2.8 09/13/2016   CHOLHDL 3.9 12/27/2014   INR 0.95 12/26/2014   K 5.3 (H) 09/13/2016   BUN 17 09/13/2016   CREATININE 0.72 09/13/2016    Past medical and surgical history were reviewed and updated in EPIC.  Current Meds  Medication Sig  . acetaminophen (TYLENOL) 325 MG tablet Take 650 mg by mouth as needed (pain).   Francia Greaves THYROID 60 MG tablet Take 1 tablet by mouth as directed.   . Calcium Carb-Cholecalciferol (CALCIUM + D3) 600-200 MG-UNIT TABS Take 1 tablet by mouth daily.   . clopidogrel (PLAVIX) 75 MG tablet Take 75 mg by mouth daily.  . diazepam (VALIUM) 5 MG tablet Take 5 mg by mouth at bedtime as needed for anxiety (sleep).   . dorzolamide (TRUSOPT) 2 % ophthalmic solution Place 1 drop into the right eye daily.   Marland Kitchen latanoprost (XALATAN) 0.005 % ophthalmic solution Place 1 drop into both eyes at bedtime.  . Multiple  Vitamin (MULTIVITAMIN) tablet Take 1 tablet by mouth daily.  . rosuvastatin (CRESTOR) 5 MG tablet Take 5 mg by mouth every morning.  . thyroid (ARMOUR) 15 MG tablet Take 15 mg by mouth. One tablet 4 times weekly     Allergies: Neuromuscular blocking agents and Vicodin [hydrocodone-acetaminophen]  Social History   Social History  . Marital status: Married    Spouse name: N/A  . Number of children: 1  . Years of education: N/A   Occupational History  . 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 Smoking status: Former Smoker    Packs/day: 0.25    Types: Cigarettes    Quit date: 03/12/1978  . Smokeless tobacco: Never Used     Comment: quit in 1978  . Alcohol use 4.2 oz/week    7 Glasses of wine per week  . Drug use: No  . Sexual activity: Not Currently    Birth control/ protection: None   Other Topics Concern  . Not on file   Social History Narrative  . No narrative on file    Family History  Problem Relation Age of Onset  . Other Father 4       SEPSIS  . Hypertension Father   . Stroke Father 99       CVA  . Heart failure Mother 32  . Heart murmur Mother   . Heart attack Sister 51  . Diabetes Brother   . Breast cancer Sister   . Stroke Sister   . Cirrhosis Brother   . Hypertension Sister        x 2  . Diabetes Sister   . Heart murmur Daughter     Review of Systems: Review of Systems  Constitutional: Positive for malaise/fatigue.  HENT: Negative.   Eyes: Negative.   Respiratory: Negative.   Cardiovascular: Positive for chest pain and palpitations. Negative for claudication, leg swelling and PND.  Gastrointestinal: Negative.   Genitourinary: Negative.   Musculoskeletal: Positive for myalgias (bilateral achiness in legs, particularly when standing for extended periods).  Skin: Negative.   Neurological: Positive for dizziness. Negative for sensory change.  Endo/Heme/Allergies: Negative.   Psychiatric/Behavioral: Negative.    --------------------------------------------------------------------------------------------------  Physical Exam: BP 128/64 (BP Location: Left Arm, Patient Position: Sitting, Cuff Size: Normal)    Pulse 79   Ht 5\' 2"  (1.575 m)   Wt 125 lb (56.7 kg)   BMI 22.86 kg/m   General:  Thin woman, seated comfortably in the exam room. HEENT: No conjunctival pallor or scleral icterus. Moist mucous membranes.  OP clear. Neck: Supple without lymphadenopathy, thyromegaly, JVD, or HJR. No carotid bruit. Lungs: Normal work of breathing. Clear to auscultation bilaterally without wheezes or crackles. Heart: Regular rate and rhythm without murmurs, rubs, or gallops. Non-displaced PMI. Abd: Bowel sounds present. Soft, NT/ND without hepatosplenomegaly Ext: No lower extremity edema. 2+ right radial and pedal pulses. Trace left radial pulse. Skin: Warm and dry without rash.  EKG:  Normal sinus rhythm without significant abnormalities. Subtle ST segment elevation in V1 and V2 most likely represent early repolarization. No significant change since 08/07/16.  Lab Results  Component Value Date   WBC 4.2 09/13/2016   HGB 14.8 09/13/2016   HCT 44.9 09/13/2016   MCV 90 09/13/2016   PLT 269 09/13/2016    Lab Results  Component Value Date   NA 136 09/13/2016   K 5.3 (H) 09/13/2016   CL 99 09/13/2016  CO2 23 09/13/2016   BUN 17 09/13/2016   CREATININE 0.72 09/13/2016   GLUCOSE 108 (H) 09/13/2016   ALT 20 09/13/2016    Lab Results  Component Value Date   CHOL 183 09/13/2016   HDL 65 09/13/2016   LDLCALC 100 (H) 09/13/2016   TRIG 89 09/13/2016   CHOLHDL 2.8 09/13/2016    --------------------------------------------------------------------------------------------------  ASSESSMENT AND PLAN: Accelerating angina Ms. Spitzley describes several years of substernal burning, which has gradually worsened over the last 1-2 years and is now present when climbing a flight of stairs or walking up a slight incline, consistent with CCS class III angina. Last ischemia evaluation in 2012 was negative. We have agreed to perform an exercise myocardial perfusion stress test. Ms. Smouse should continue with  clopidogrel (with planned switch to aspirin after completing current supply of clopidogrel).  Left subclavian stenosis Recent vascular studies showed diminished flow in the left arm, compatible with decreased blood pressures on this side. I have reviewed the neck CTA images from 2016, which clearly demonstrate a focal moderate stenosis of the proximal left subclavian artery. I suspect that some of her dizziness may be related to subclavian steal and/or vertebrobasilar insufficiency related to left subclavian artery stenosis and chronic occlusion of right vertebral artery (noted on prior CTA). We have agreed to referral to vascular surgery, though we will defer this until after further cardiac workup (as described above).  Palpitation and history of stroke Symptoms concerning for underlying arrhythmia, including possible PAF. We will obtain 30 day event monitor for further characterization.  Hyperlipidemia Most recent LDL was 100 in July. With history of CVA and peripheral vascular disease, her goal LDL is less than 70. Ms. Coffin is concerned that her myalgias may be related to rosuvastatin. I have recommended a statin holiday; if her pain resolves with holding rosuvastatin, we will need to consider an alternate statin versus PCSK9 inhibitor therapy.  Follow-up: Return to clinic in 6 weeks.  Nelva Bush, MD 11/17/2016 9:16 PM

## 2016-11-16 NOTE — Patient Instructions (Signed)
Medication Instructions:  Your physician recommends that you continue on your current medications as directed. Please refer to the Current Medication list given to you today.   Labwork: None ordered  Testing/Procedures: Your physician has requested that you have en exercise stress myoview. For further information please visit [email or web address]. Please follow instruction sheet, as given.  Your physician has recommended that you wear an event monitor. Event monitors are medical devices that record the heart's electrical activity. Doctors most often [place] these monitors to diagnose arrhythmias. Arrhythmias are problems with the speed or rhythm of the heartbeat. The monitor is a small, portable device. You can wear one while you do your normal daily activities. This is usually used to diagnose what is causing palpitations/syncope (passing out).   Follow-Up: Your physician wants you to follow-up in: 6 weeks with Dr. Saunders Revel.   Any Other Special Instructions Will Be Listed Below (If Applicable).     If you need a refill on your cardiac medications before your next appointment, please call your pharmacy.

## 2016-11-17 DIAGNOSIS — E785 Hyperlipidemia, unspecified: Secondary | ICD-10-CM | POA: Insufficient documentation

## 2016-11-17 DIAGNOSIS — I25118 Atherosclerotic heart disease of native coronary artery with other forms of angina pectoris: Secondary | ICD-10-CM | POA: Insufficient documentation

## 2016-11-17 DIAGNOSIS — I200 Unstable angina: Secondary | ICD-10-CM | POA: Insufficient documentation

## 2016-11-17 DIAGNOSIS — I771 Stricture of artery: Secondary | ICD-10-CM | POA: Insufficient documentation

## 2016-11-17 DIAGNOSIS — R002 Palpitations: Secondary | ICD-10-CM | POA: Insufficient documentation

## 2016-11-19 ENCOUNTER — Telehealth (HOSPITAL_COMMUNITY): Payer: Self-pay | Admitting: *Deleted

## 2016-11-19 NOTE — Telephone Encounter (Signed)
Left message on voicemail per DPR in reference to upcoming appointment scheduled on 11/22/16 at [facility] with detailed instructions given per Myocardial Perfusion Study Information Sheet for the test. LM to arrive 15 minutes early, and that it is imperative to arrive on time for appointment to keep from having the test rescheduled. If you need to cancel or reschedule your appointment, please call the office within 24 hours of your appointment. Failure to do so may result in a cancellation of your appointment, and a $50 no show fee. Phone number given for call back for any questions.

## 2016-11-20 DIAGNOSIS — H401122 Primary open-angle glaucoma, left eye, moderate stage: Secondary | ICD-10-CM | POA: Diagnosis not present

## 2016-11-22 ENCOUNTER — Ambulatory Visit (HOSPITAL_COMMUNITY): Payer: Medicare HMO | Attending: Cardiovascular Disease

## 2016-11-22 DIAGNOSIS — R42 Dizziness and giddiness: Secondary | ICD-10-CM | POA: Diagnosis not present

## 2016-11-22 DIAGNOSIS — R0789 Other chest pain: Secondary | ICD-10-CM | POA: Insufficient documentation

## 2016-11-22 DIAGNOSIS — I200 Unstable angina: Secondary | ICD-10-CM | POA: Diagnosis not present

## 2016-11-22 DIAGNOSIS — R5383 Other fatigue: Secondary | ICD-10-CM | POA: Insufficient documentation

## 2016-11-22 DIAGNOSIS — R079 Chest pain, unspecified: Secondary | ICD-10-CM | POA: Diagnosis present

## 2016-11-22 DIAGNOSIS — I2510 Atherosclerotic heart disease of native coronary artery without angina pectoris: Secondary | ICD-10-CM | POA: Insufficient documentation

## 2016-11-22 DIAGNOSIS — Z8673 Personal history of transient ischemic attack (TIA), and cerebral infarction without residual deficits: Secondary | ICD-10-CM | POA: Diagnosis not present

## 2016-11-22 LAB — MYOCARDIAL PERFUSION IMAGING
CHL CUP MPHR: 149 {beats}/min
CHL CUP NUCLEAR SDS: 9
CHL CUP NUCLEAR SRS: 11
CSEPHR: 91 %
Estimated workload: 7 METS
Exercise duration (min): 5 min
Exercise duration (sec): 30 s
LHR: 0.27
LV sys vol: 11 mL
LVDIAVOL: 46 mL (ref 46–106)
NUC STRESS TID: 1.09
Peak HR: 137 {beats}/min
Rest HR: 79 {beats}/min
SSS: 20

## 2016-11-22 MED ORDER — TECHNETIUM TC 99M TETROFOSMIN IV KIT
31.3000 | PACK | Freq: Once | INTRAVENOUS | Status: AC | PRN
  Administered 2016-11-22: 31.3 via INTRAVENOUS
  Filled 2016-11-22: qty 32

## 2016-11-22 MED ORDER — TECHNETIUM TC 99M TETROFOSMIN IV KIT
10.5000 | PACK | Freq: Once | INTRAVENOUS | Status: AC | PRN
  Administered 2016-11-22: 10.5 via INTRAVENOUS
  Filled 2016-11-22: qty 11

## 2016-11-23 ENCOUNTER — Telehealth: Payer: Self-pay | Admitting: *Deleted

## 2016-11-23 DIAGNOSIS — I771 Stricture of artery: Secondary | ICD-10-CM

## 2016-11-23 DIAGNOSIS — I6501 Occlusion and stenosis of right vertebral artery: Secondary | ICD-10-CM

## 2016-11-23 NOTE — Telephone Encounter (Signed)
Notes recorded by Nelva Bush, MD on 11/23/2016 at 12:09 PM EDT Please let [name] know that her stress test is normal without evidence of a significant blockage. We will follow-up as planned after completion of the event monitor. If she has any worsening symptoms in the meantime, she should let us know. In addition, please refer her to vascular surgery for evaluation of left subclavian and right basilar artery disease.

## 2016-11-28 ENCOUNTER — Ambulatory Visit (INDEPENDENT_AMBULATORY_CARE_PROVIDER_SITE_OTHER): Payer: Medicare HMO

## 2016-11-28 DIAGNOSIS — R002 Palpitations: Secondary | ICD-10-CM

## 2016-11-28 DIAGNOSIS — Z8673 Personal history of transient ischemic attack (TIA), and cerebral infarction without residual deficits: Secondary | ICD-10-CM | POA: Diagnosis not present

## 2016-12-05 ENCOUNTER — Other Ambulatory Visit: Payer: Self-pay | Admitting: Family Medicine

## 2016-12-11 DIAGNOSIS — H401122 Primary open-angle glaucoma, left eye, moderate stage: Secondary | ICD-10-CM | POA: Diagnosis not present

## 2016-12-12 DIAGNOSIS — R69 Illness, unspecified: Secondary | ICD-10-CM | POA: Diagnosis not present

## 2016-12-13 ENCOUNTER — Other Ambulatory Visit: Payer: Self-pay | Admitting: Adult Health

## 2016-12-13 DIAGNOSIS — I639 Cerebral infarction, unspecified: Secondary | ICD-10-CM

## 2016-12-13 NOTE — Telephone Encounter (Signed)
We have not prescribed these medications for the patient previously.  Please review and refill if appropriate.  T. Nelson, CMA  

## 2016-12-19 ENCOUNTER — Other Ambulatory Visit: Payer: Self-pay | Admitting: Adult Health

## 2016-12-19 MED ORDER — CLOPIDOGREL BISULFATE 75 MG PO TABS: 75.0000 mg | ORAL_TABLET | Freq: Every day | ORAL | 6 refills | Status: DC

## 2016-12-19 NOTE — Telephone Encounter (Signed)
We have not prescribed these medications for the patient previously.  Please review and refill if appropriate.  T. Nelson, CMA  

## 2016-12-25 DIAGNOSIS — E890 Postprocedural hypothyroidism: Secondary | ICD-10-CM | POA: Diagnosis not present

## 2016-12-26 ENCOUNTER — Other Ambulatory Visit: Payer: Self-pay | Admitting: *Deleted

## 2016-12-26 ENCOUNTER — Encounter: Payer: Self-pay | Admitting: Surgery

## 2016-12-26 ENCOUNTER — Ambulatory Visit (INDEPENDENT_AMBULATORY_CARE_PROVIDER_SITE_OTHER): Payer: Medicare HMO | Admitting: Surgery

## 2016-12-26 ENCOUNTER — Encounter: Payer: Self-pay | Admitting: *Deleted

## 2016-12-26 VITALS — BP 125/73 | HR 77 | Temp 97.6°F | Resp 16 | Ht 62.0 in | Wt 127.4 lb

## 2016-12-26 DIAGNOSIS — G458 Other transient cerebral ischemic attacks and related syndromes: Secondary | ICD-10-CM

## 2016-12-26 NOTE — Progress Notes (Signed)
[facility] [place]  Patient name: [name] MRN: [id number] DOB: 1946-03-09 Sex: female   REQUESTING PROVIDER:    Dr. Saunders Revel   REASON FOR CONSULT:    Left subclavian stenosis  HISTORY OF PRESENT ILLNESS:   [name] is a 71 y.o. female, who is Referred today for evaluation of asymmetric blood pressures.  The patient initially began having problems in April or she became lightheaded.  She checked her blood pressure and found that it was lower and the left arm measuring 92/68.  Her right arm remained normal with systolic pressures in the 120s.  She went on to have a duplex ultrasound which showed 1-39 percent lateral carotid stenosis with irregular blood flow in the vertebral arteries.  The patient does not state that she knows what her symptoms will occur.  She does say her left arm will fatigue at times.  She does have a history of stroke approximately 2 years ago secondary to a right vertebral artery occlusion.  She is managed for hypercholesterolemia with a statin.  She is a former smoker  PAST MEDICAL HISTORY    Past Medical History:  Diagnosis Date  . Anxiety   . Barrett esophagus 2003  . Diverticulitis   . Diverticulosis   . GERD (gastroesophageal reflux disease)   . Glaucoma   . Grave's disease   . H. pylori infection   . Hepatitis A    in her 14 from food  . Hyperlipidemia   . Hypothyroidism   . Increased pressure in the eye   . Insomnia   . Morton's neuroma   . Murmur   . Scoliosis   . Stroke [facility]) 12/2014   no deficits     FAMILY HISTORY   Family History  Problem Relation Age of Onset  . Other Father 64       SEPSIS  . Hypertension Father   . Stroke Father 98       CVA  . Heart failure Mother 26  . Heart murmur Mother   . Heart attack Sister 74  . Diabetes Brother   . Breast cancer Sister   . Stroke Sister   . Cirrhosis Brother   . Hypertension Sister        x 2  . Diabetes Sister    . Heart murmur Daughter     SOCIAL HISTORY:   Social History   Social History  . Marital status: Married    Spouse name: N/A  . Number of children: 1  . Years of education: N/A   Occupational History  . 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 Smoking status: Former Smoker    Packs/day: 0.25    Types: Cigarettes    Quit date: 03/12/1978  . Smokeless tobacco: Never Used     Comment: quit in 1978  . Alcohol use 4.2 oz/week    7 Glasses of wine per week  . Drug use: No  . Sexual activity: Not Currently    Birth control/ protection: None   Other Topics Concern  . Not on file   Social History Narrative  . No narrative on file    ALLERGIES:    Allergies  Allergen Reactions  . Neuromuscular Blocking Agents Other (See Comments)    Multiple symptoms, SOB, fatigue, weakness  . Vicodin [Hydrocodone-Acetaminophen]     "Can't remember exact reaction"    CURRENT MEDICATIONS:  Current Outpatient Prescriptions  Medication Sig Dispense Refill  . acetaminophen (TYLENOL) 325 MG tablet Take 650 mg by mouth as needed (pain).     Francia Greaves THYROID 60 MG tablet Take 1 tablet by mouth as directed.     . Calcium Carb-Cholecalciferol (CALCIUM + D3) 600-200 MG-UNIT TABS Take 1 tablet by mouth daily.     . clopidogrel (PLAVIX) 75 MG tablet Take 1 tablet (75 mg total) by mouth daily. 30 tablet 6  . dorzolamide (TRUSOPT) 2 % ophthalmic solution Place 1 drop into the right eye daily.     Marland Kitchen latanoprost (XALATAN) 0.005 % ophthalmic solution Place 1 drop into both eyes at bedtime.    . Multiple Vitamin (MULTIVITAMIN) tablet Take 1 tablet by mouth daily.    . rosuvastatin (CRESTOR) 5 MG tablet TAKE 1 TABLET BY MOUTH EVERY DAY 3 tablet 1  . thyroid (ARMOUR) 15 MG tablet Take 15 mg by mouth. One tablet 4 times weekly    . diazepam (VALIUM) 5 MG tablet Take 5 mg by mouth at bedtime as needed for anxiety (sleep).      No current  facility-administered medications for this visit.     REVIEW OF SYSTEMS:   [X]  denotes positive finding, [ ]  denotes negative finding Cardiac  Comments:  Chest pain or chest pressure:    Shortness of breath upon exertion:    Short of breath when lying flat:    Irregular heart rhythm: ?       Vascular    Pain in calf, thigh, or hip brought on by ambulation:    Pain in feet at night that wakes you up from your sleep:     Blood clot in your veins:    Leg swelling:         Pulmonary    Oxygen at home:    Productive cough:     Wheezing:         Neurologic    Sudden weakness in arms or legs:     Sudden numbness in arms or legs:     Sudden onset of difficulty speaking or slurred speech:    Temporary loss of vision in one eye:     Problems with dizziness:         Gastrointestinal    Blood in stool:      Vomited blood:         Genitourinary    Burning when urinating:     Blood in urine:        Psychiatric    Major depression:         Hematologic    Bleeding problems:    Problems with blood clotting too easily:        Skin    Rashes or ulcers:        Constitutional    Fever or chills:     PHYSICAL EXAM:   Vitals:   12/26/16 1022 12/26/16 1023  BP: 93/73 125/73  Pulse: 77   Resp: 16   Temp: 97.6 F (36.4 C)   TempSrc: Oral   SpO2: 97%   Weight: 127 lb 6.4 oz (57.8 kg)   Height: 5\' 2"  (1.575 m)     GENERAL: The patient is a well-nourished female, in no acute distress. The vital signs are documented above. CARDIAC: There is a regular rate and rhythm.  VASCULAR: I cannot palpate a left radial pulse.  No carotid bruits PULMONARY: Nonlabored respirations MUSCULOSKELETAL: There are no major deformities or cyanosis.  NEUROLOGIC: No focal weakness or paresthesias are detected. SKIN: There are no ulcers or rashes noted. PSYCHIATRIC: The patient has a normal affect.  STUDIES:   I have reviewed her ultrasound which shows irregular blood flow in bilateral  vertebral arteries.  1-39 percent bilateral carotid stenosis. I have reviewed her CT scan from 2 years ago.  There is some soft plaque at the origin of the subclavian artery but no significant stenosis  ASSESSMENT and PLAN   Possible left subclavian steal: I discussed with the patient that the next option would be to get more diagnostic information.  We can do this with a CT angiogram or grow straight angiography.  We have decided to proceed with angiography via a right femoral approach.  I'll perform aortic arch angiogram to evaluate the great vessels and determine the direction of blood flow of her left vertebral artery.  If she has a significant left subclavian stenosis this will be treated at that time.  If she does not have significant left subclavian stenosis I will do a left arm arteriogram to determine the cause of her discrepancy in her blood pressure.  I discussed the risks and benefits of the procedure including the risk of bleeding as well as the small risk of stroke.  This is been scheduled for Tuesday, October 23.   [name], MD [facility] [phone] Pager [phone]

## 2017-01-01 ENCOUNTER — Encounter: Payer: Self-pay | Admitting: *Deleted

## 2017-01-01 ENCOUNTER — Telehealth: Payer: Self-pay | Admitting: Surgery

## 2017-01-01 ENCOUNTER — Ambulatory Visit (HOSPITAL_COMMUNITY)
Admission: RE | Disposition: A | Discharge status: Home / Self Care | Payer: Self-pay | Source: Ambulatory Visit | Attending: Surgery

## 2017-01-01 ENCOUNTER — Ambulatory Visit (HOSPITAL_COMMUNITY)
Admission: RE | Admit: 2017-01-01 | Discharge: 2017-01-01 | Disposition: A | Discharge status: Home / Self Care | Payer: Medicare HMO | Source: Ambulatory Visit | Attending: Surgery | Admitting: Surgery

## 2017-01-01 DIAGNOSIS — I6501 Occlusion and stenosis of right vertebral artery: Secondary | ICD-10-CM | POA: Insufficient documentation

## 2017-01-01 DIAGNOSIS — Z8673 Personal history of transient ischemic attack (TIA), and cerebral infarction without residual deficits: Secondary | ICD-10-CM | POA: Insufficient documentation

## 2017-01-01 DIAGNOSIS — F419 Anxiety disorder, unspecified: Secondary | ICD-10-CM | POA: Diagnosis not present

## 2017-01-01 DIAGNOSIS — R69 Illness, unspecified: Secondary | ICD-10-CM | POA: Diagnosis not present

## 2017-01-01 DIAGNOSIS — E0500 Thyrotoxicosis with diffuse goiter without thyrotoxic crisis or storm: Secondary | ICD-10-CM | POA: Insufficient documentation

## 2017-01-01 DIAGNOSIS — E7800 Pure hypercholesterolemia, unspecified: Secondary | ICD-10-CM | POA: Diagnosis not present

## 2017-01-01 DIAGNOSIS — K219 Gastro-esophageal reflux disease without esophagitis: Secondary | ICD-10-CM | POA: Diagnosis not present

## 2017-01-01 DIAGNOSIS — G458 Other transient cerebral ischemic attacks and related syndromes: Secondary | ICD-10-CM | POA: Diagnosis not present

## 2017-01-01 DIAGNOSIS — I708 Atherosclerosis of other arteries: Secondary | ICD-10-CM | POA: Insufficient documentation

## 2017-01-01 DIAGNOSIS — Z823 Family history of stroke: Secondary | ICD-10-CM | POA: Diagnosis not present

## 2017-01-01 DIAGNOSIS — Z8249 Family history of ischemic heart disease and other diseases of the circulatory system: Secondary | ICD-10-CM | POA: Insufficient documentation

## 2017-01-01 DIAGNOSIS — Z87891 Personal history of nicotine dependence: Secondary | ICD-10-CM | POA: Diagnosis not present

## 2017-01-01 DIAGNOSIS — H409 Unspecified glaucoma: Secondary | ICD-10-CM | POA: Diagnosis not present

## 2017-01-01 DIAGNOSIS — M419 Scoliosis, unspecified: Secondary | ICD-10-CM | POA: Insufficient documentation

## 2017-01-01 DIAGNOSIS — Z006 Encounter for examination for normal comparison and control in clinical research program: Secondary | ICD-10-CM

## 2017-01-01 HISTORY — PX: UPPER EXTREMITY ANGIOGRAPHY: CATH118270

## 2017-01-01 HISTORY — PX: AORTIC ARCH ANGIOGRAPHY: CATH118224

## 2017-01-01 HISTORY — PX: PERIPHERAL VASCULAR INTERVENTION: CATH118257

## 2017-01-01 LAB — POCT I-STAT, CHEM 8
BUN: 13 mg/dL (ref 6–20)
CHLORIDE: 101 mmol/L (ref 101–111)
Calcium, Ion: 1.27 mmol/L (ref 1.15–1.40)
Creatinine, Ser: 0.7 mg/dL (ref 0.44–1.00)
Glucose, Bld: 96 mg/dL (ref 65–99)
HCT: 45 % (ref 36.0–46.0)
Hemoglobin: 15.3 g/dL — ABNORMAL HIGH (ref 12.0–15.0)
POTASSIUM: 4.1 mmol/L (ref 3.5–5.1)
SODIUM: 138 mmol/L (ref 135–145)
TCO2: 26 mmol/L (ref 22–32)

## 2017-01-01 LAB — POCT ACTIVATED CLOTTING TIME
Activated Clotting Time: 230 seconds
Activated Clotting Time: 191 seconds
Activated Clotting Time: 175 seconds

## 2017-01-01 SURGERY — AORTIC ARCH ANGIOGRAPHY
Anesthesia: LOCAL

## 2017-01-01 MED ORDER — SODIUM CHLORIDE 0.9% FLUSH: 3.0000 mL | Freq: Two times a day (BID) | INTRAVENOUS | Status: DC

## 2017-01-01 MED ORDER — HYDRALAZINE HCL 20 MG/ML IJ SOLN: 5.0000 mg | INTRAMUSCULAR | Status: DC | PRN

## 2017-01-01 MED ORDER — HEPARIN SODIUM (PORCINE) 1000 UNIT/ML IJ SOLN
INTRAMUSCULAR | Status: AC
  Filled 2017-01-01: qty 1

## 2017-01-01 MED ORDER — SODIUM CHLORIDE 0.9% FLUSH: 3.0000 mL | INTRAVENOUS | Status: DC | PRN

## 2017-01-01 MED ORDER — SODIUM CHLORIDE 0.9 % IV SOLN: 250.0000 mL | INTRAVENOUS | Status: DC | PRN

## 2017-01-01 MED ORDER — MORPHINE SULFATE (PF) 10 MG/ML IV SOLN: 2.0000 mg | INTRAVENOUS | Status: DC | PRN

## 2017-01-01 MED ORDER — ASPIRIN EC 81 MG PO TBEC: 81.0000 mg | DELAYED_RELEASE_TABLET | Freq: Every day | ORAL | Status: DC

## 2017-01-01 MED ORDER — LABETALOL HCL 5 MG/ML IV SOLN: 10.0000 mg | INTRAVENOUS | Status: DC | PRN

## 2017-01-01 MED ORDER — FENTANYL CITRATE (PF) 100 MCG/2ML IJ SOLN
INTRAMUSCULAR | Status: DC | PRN
  Administered 2017-01-01: 25 ug via INTRAVENOUS

## 2017-01-01 MED ORDER — ACETAMINOPHEN 500 MG PO TABS
ORAL_TABLET | ORAL | Status: AC
  Filled 2017-01-01: qty 2

## 2017-01-01 MED ORDER — HEPARIN (PORCINE) IN NACL 2-0.9 UNIT/ML-% IJ SOLN
INTRAMUSCULAR | Status: AC
  Filled 2017-01-01: qty 1000

## 2017-01-01 MED ORDER — LIDOCAINE HCL 2 % IJ SOLN
INTRAMUSCULAR | Status: AC
  Filled 2017-01-01: qty 10

## 2017-01-01 MED ORDER — ACETAMINOPHEN 500 MG PO TABS: 500.0000 mg | ORAL_TABLET | Freq: Once | ORAL | Status: DC

## 2017-01-01 MED ORDER — ACETAMINOPHEN 500 MG PO TABS
1000.0000 mg | ORAL_TABLET | Freq: Once | ORAL | Status: AC
  Administered 2017-01-01: 1000 mg via ORAL

## 2017-01-01 MED ORDER — CLOPIDOGREL BISULFATE 75 MG PO TABS: 75.0000 mg | ORAL_TABLET | Freq: Every day | ORAL | Status: DC

## 2017-01-01 MED ORDER — FENTANYL CITRATE (PF) 100 MCG/2ML IJ SOLN
INTRAMUSCULAR | Status: AC
  Filled 2017-01-01: qty 2

## 2017-01-01 MED ORDER — SODIUM CHLORIDE 0.9 % WEIGHT BASED INFUSION: 1.0000 mL/kg/h | INTRAVENOUS | Status: DC

## 2017-01-01 MED ORDER — HEPARIN SODIUM (PORCINE) 1000 UNIT/ML IJ SOLN
INTRAMUSCULAR | Status: DC | PRN
  Administered 2017-01-01: 6000 [IU] via INTRAVENOUS

## 2017-01-01 MED ORDER — LIDOCAINE HCL (PF) 1 % IJ SOLN
INTRAMUSCULAR | Status: DC | PRN
  Administered 2017-01-01: 8 mL

## 2017-01-01 MED ORDER — MIDAZOLAM HCL 2 MG/2ML IJ SOLN
INTRAMUSCULAR | Status: DC | PRN
  Administered 2017-01-01: 1 mg via INTRAVENOUS

## 2017-01-01 MED ORDER — MIDAZOLAM HCL 2 MG/2ML IJ SOLN
INTRAMUSCULAR | Status: AC
  Filled 2017-01-01: qty 2

## 2017-01-01 MED ORDER — SODIUM CHLORIDE 0.9 % IV SOLN
INTRAVENOUS | Status: DC
  Administered 2017-01-01: 06:00:00 via INTRAVENOUS

## 2017-01-01 SURGICAL SUPPLY — 17 items
BALLN MUSTANG 9X20X135 (BALLOONS) ×3
BALLOON MUSTANG 9X20X135 (BALLOONS) IMPLANT
CATH ANGIO 5F BER2 100CM (CATHETERS) ×1 IMPLANT
DEVICE TORQUE H2O (MISCELLANEOUS) ×1 IMPLANT
GUIDEWIRE ANGLED .035X260CM (WIRE) ×1 IMPLANT
KIT ENCORE 26 ADVANTAGE (KITS) ×1 IMPLANT
KIT MICROINTRODUCER STIFF 5F (SHEATH) ×1 IMPLANT
KIT PV (KITS) ×3 IMPLANT
SHEATH PINNACLE 5F 10CM (SHEATH) ×1 IMPLANT
SHEATH PINNACLE 7F 10CM (SHEATH) ×1 IMPLANT
SHEATH SHUTTLE 7FR (SHEATH) ×1 IMPLANT
STENT EXPRESS LD 8X27X135 (Permanent Stent) ×1 IMPLANT
SYR MEDRAD MARK V 150ML (SYRINGE) ×3 IMPLANT
TRANSDUCER W/STOPCOCK (MISCELLANEOUS) ×3 IMPLANT
TRAY PV CATH (CUSTOM PROCEDURE TRAY) ×3 IMPLANT
WIRE BENTSON .035X145CM (WIRE) ×1 IMPLANT
WIRE ROSEN-J .035X260CM (WIRE) ×1 IMPLANT

## 2017-01-01 NOTE — Telephone Encounter (Signed)
-----   Message from Mena Goes, RN sent at 01/01/2017  9:46 AM EDT ----- Regarding: 4-6 weeks w/ duplex   ----- Message ----- From: Serafina Mitchell, MD Sent: 01/01/2017   8:42 AM To: Vvs Charge Pool  01/01/2017:  Surgeon:  [name] Procedure Performed:  1.  Ultrasound-guided access, right femoral artery  2.  Aortic arch angiogram  3.  First order catheterization of (left subclavian artery arteriogram  4.  Stent left subclavian artery  5.  Conscious sedation (48 minutes)  Follow-up 46 weeks with carotid/subclavian artery duplex and to see me

## 2017-01-01 NOTE — Progress Notes (Signed)
STROKE-AF research study month 24 follow up visit completed while patient was in short stay for an op procedure. Next research required visit is due between the following dates 24/mar/2019-19/may/2019

## 2017-01-01 NOTE — Interval H&P Note (Signed)
History and Physical Interval Note:  01/01/2017 7:01 AM  [name]  has presented today for surgery, with the diagnosis of sss  The various methods of treatment have been discussed with the patient and family. After consideration of risks, benefits and other options for treatment, the patient has consented to  Procedure(s): AORTIC ARCH ANGIOGRAPHY (N/A) as a surgical intervention .  The patient's history has been reviewed, patient examined, no change in status, stable for surgery.  I have reviewed the patient's chart and labs.  Questions were answered to the patient's satisfaction.     [name]

## 2017-01-01 NOTE — Telephone Encounter (Signed)
Sched appt 02/11/17; lab at 2:00 and MD at 3:00. Lm on hm#.

## 2017-01-01 NOTE — Progress Notes (Signed)
Patients 7 fr right femoral arterial sheath was pulled per order. Manual pressure was held for 30 minutes. Patient tolerated pull well. Sheath site during and post sheath pull was clean, dry, intact and had no sign of a hematoma. Patients VS remained WNL's during and post sheath pull. Patients bedrest began at 1050 and ends in 4 fours at 1450. Vascular site assessment was a level 0. Sheath removal site was dressed with a gauze dressing and was clean, dry, and intact. Patient was educated about post sheath pull management and stated he understood and had no questions.

## 2017-01-01 NOTE — Op Note (Signed)
Patient name: [name] MRN: [id number] DOB: Feb 22, 1946 Sex: female  01/01/2017 Pre-operative Diagnosis: Left subclavian steal Post-operative diagnosis:  Same Surgeon:  [name] Procedure Performed:  1.  Ultrasound-guided access, right femoral artery  2.  Aortic arch angiogram  3.  First order catheterization of (left subclavian artery arteriogram  4.  Stent left subclavian artery  5.  Conscious sedation (48 minutes)     Indications:  The patient had a history of a right vertebral artery occlusion with subsequent stroke.  She is recently found to have asymmetric upper extremity blood pressures and has issues with left arm weakness as well as dizziness.  She comes in today for further evaluation  Procedure:  The patient was identified in the holding area and taken to room 8.  The patient was then placed supine on the table and prepped and draped in the usual sterile fashion.  A time out was called.  Conscious sedation was administered with the use of IV fentanyl and Versed in a continuous physician and nurse monitoring.  Heart rate, blood pressure, and oxygen saturations were continuously monitored.  Ultrasound was used to evaluate the right common femoral artery.  It was patent .  A digital ultrasound image was acquired.  A micropuncture needle was used to access the right common femoral artery under ultrasound guidance.  An 018 wire was advanced without resistance and a micropuncture sheath was placed.  The 018 wire was removed and a benson wire was placed.  The micropuncture sheath was exchanged for a 5 french sheath.  A pigtail catheter was advanced into the ascending aorta and aortic arch injury and was performed.  Next using a Berenstein 2 catheter and a Benson wire, the subclavian artery was selected and advanced.  Findings:   Aorticarch:  A type I aortic arch is identified.  No significant ostial great vessel stenosis is identified.  The right vertebral artery is occluded.   There is a nearly occlusive lesion within the left subclavian artery.  There is a large left vertebral artery which is widely patent.  Vertebral artery flow is antegrade.  Left subclavian:  A greater than 80% stenosis is identified in the proximal portion of the left subclavian artery, proximal to the vertebral artery.  This does not appear to extend to the origin of the artery.   Intervention:  After the above images were acquired the decision was made to proceed with intervention.  I used.  I used a Berenstein 2 catheter and a Glidewire to get wire access out across the stenosis in the proximal subclavian artery.  The catheter was advanced over the wire.  The Glidewire was removed and a Rosen wire was inserted.  A 7 French 90 cm sheath was inserted and the patient was fully heparinized.  The sheath was advanced up to the origin of the subclavian artery.  I selected a Xpress 8 x 25 balloon expandable stent and placed this across the lesion and deployed this taking the balloon to nominal pressure.  Follow-up imaging revealed resolution of the stenosis, however I elected to flare the proximal portion of the stent.  This was done with a 9 x 20 balloon.  Again the follow up imaging was performed which showed successful resolution of the stenosis.  Catheters and wires were removed and the sheath was exchanged out for a short sheath.  The patient taken the holding area in stable condition.   Impression:  #1  occluded right vertebral artery  #2  greater than 80% stenosis in the proximal right subclavian artery which was successfully stented using an 8 x 25 balloon expandable stent with resolution of the stenosis    V. [name], M.D. [facility] [place] Office: [phone] Pager:  [phone]

## 2017-01-01 NOTE — H&P (View-Only) (Signed)
[facility] of [place]  Patient name: [name] MRN: [id number] DOB: 1945-11-20 Sex: female   REQUESTING PROVIDER:    Dr. Saunders Revel   REASON FOR CONSULT:    Left subclavian stenosis  HISTORY OF PRESENT ILLNESS:   [name] is a 71 y.o. female, who is Referred today for evaluation of asymmetric blood pressures.  The patient initially began having problems in April or she became lightheaded.  She checked her blood pressure and found that it was lower and the left arm measuring 92/68.  Her right arm remained normal with systolic pressures in the 120s.  She went on to have a duplex ultrasound which showed 1-39 percent lateral carotid stenosis with irregular blood flow in the vertebral arteries.  The patient does not state that she knows what her symptoms will occur.  She does say her left arm will fatigue at times.  She does have a history of stroke approximately 2 years ago secondary to a right vertebral artery occlusion.  She is managed for hypercholesterolemia with a statin.  She is a former smoker  PAST MEDICAL HISTORY    Past Medical History:  Diagnosis Date  . Anxiety   . Barrett esophagus 2003  . Diverticulitis   . Diverticulosis   . GERD (gastroesophageal reflux disease)   . Glaucoma   . Grave's disease   . H. pylori infection   . Hepatitis A    in her 66 from food  . Hyperlipidemia   . Hypothyroidism   . Increased pressure in the eye   . Insomnia   . Morton's neuroma   . Murmur   . Scoliosis   . Stroke [facility]) 12/2014   no deficits     FAMILY HISTORY   Family History  Problem Relation Age of Onset  . Other Father 33       SEPSIS  . Hypertension Father   . Stroke Father 50       CVA  . Heart failure Mother 46  . Heart murmur Mother   . Heart attack Sister 12  . Diabetes Brother   . Breast cancer Sister   . Stroke Sister   . Cirrhosis Brother   . Hypertension Sister        x 2  . Diabetes Sister    . Heart murmur Daughter     SOCIAL HISTORY:   Social History   Social History  . Marital status: Married    Spouse name: [name]  . Number of children: 1  . 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 Smoking status: Former Smoker    Packs/day: 0.25    Types: Cigarettes    Quit date: 03/12/1978  . Smokeless tobacco: Never Used     Comment: quit in 1978  . Alcohol use 4.2 oz/week    7 Glasses of wine per week  . Drug use: No  . Sexual activity: Not Currently    Birth control/ protection: None   Other Topics Concern  . Not on file   Social History Narrative  . No narrative on file    ALLERGIES:    Allergies  Allergen Reactions  . Neuromuscular Blocking Agents Other (See Comments)    Multiple symptoms, SOB, fatigue, weakness  . Vicodin [Hydrocodone-Acetaminophen]     "Can't remember exact reaction"    CURRENT MEDICATIONS:  Current Outpatient Prescriptions  Medication Sig Dispense Refill  . acetaminophen (TYLENOL) 325 MG tablet Take 650 mg by mouth as needed (pain).     Francia Greaves THYROID 60 MG tablet Take 1 tablet by mouth as directed.     . Calcium Carb-Cholecalciferol (CALCIUM + D3) 600-200 MG-UNIT TABS Take 1 tablet by mouth daily.     . clopidogrel (PLAVIX) 75 MG tablet Take 1 tablet (75 mg total) by mouth daily. 30 tablet 6  . dorzolamide (TRUSOPT) 2 % ophthalmic solution Place 1 drop into the right eye daily.     Marland Kitchen latanoprost (XALATAN) 0.005 % ophthalmic solution Place 1 drop into both eyes at bedtime.    . Multiple Vitamin (MULTIVITAMIN) tablet Take 1 tablet by mouth daily.    . rosuvastatin (CRESTOR) 5 MG tablet TAKE 1 TABLET BY MOUTH EVERY DAY 3 tablet 1  . thyroid (ARMOUR) 15 MG tablet Take 15 mg by mouth. One tablet 4 times weekly    . diazepam (VALIUM) 5 MG tablet Take 5 mg by mouth at bedtime as needed for anxiety (sleep).      No current  facility-administered medications for this visit.     REVIEW OF SYSTEMS:   [X]  denotes positive finding, [ ]  denotes negative finding Cardiac  Comments:  Chest pain or chest pressure:    Shortness of breath upon exertion:    Short of breath when lying flat:    Irregular heart rhythm: ?       Vascular    Pain in calf, thigh, or hip brought on by ambulation:    Pain in feet at night that wakes you up from your sleep:     Blood clot in your veins:    Leg swelling:         Pulmonary    Oxygen at home:    Productive cough:     Wheezing:         Neurologic    Sudden weakness in arms or legs:     Sudden numbness in arms or legs:     Sudden onset of difficulty speaking or slurred speech:    Temporary loss of vision in one eye:     Problems with dizziness:         Gastrointestinal    Blood in stool:      Vomited blood:         Genitourinary    Burning when urinating:     Blood in urine:        Psychiatric    Major depression:         Hematologic    Bleeding problems:    Problems with blood clotting too easily:        Skin    Rashes or ulcers:        Constitutional    Fever or chills:     PHYSICAL EXAM:   Vitals:   12/26/16 1022 12/26/16 1023  BP: 93/73 125/73  Pulse: 77   Resp: 16   Temp: 97.6 F (36.4 C)   TempSrc: Oral   SpO2: 97%   Weight: 127 lb 6.4 oz (57.8 kg)   Height: 5\' 2"  (1.575 m)     GENERAL: The patient is a well-nourished female, in no acute distress. The vital signs are documented above. CARDIAC: There is a regular rate and rhythm.  VASCULAR: I cannot palpate a left radial pulse.  No carotid bruits PULMONARY: Nonlabored respirations MUSCULOSKELETAL: There are no major deformities or cyanosis.  NEUROLOGIC: No focal weakness or paresthesias are detected. SKIN: There are no ulcers or rashes noted. PSYCHIATRIC: The patient has a normal affect.  STUDIES:   I have reviewed her ultrasound which shows irregular blood flow in bilateral  vertebral arteries.  1-39 percent bilateral carotid stenosis. I have reviewed her CT scan from 2 years ago.  There is some soft plaque at the origin of the subclavian artery but no significant stenosis  ASSESSMENT and PLAN   Possible left subclavian steal: I discussed with the patient that the next option would be to get more diagnostic information.  We can do this with a CT angiogram or grow straight angiography.  We have decided to proceed with angiography via a right femoral approach.  I'll perform aortic arch angiogram to evaluate the great vessels and determine the direction of blood flow of her left vertebral artery.  If she has a significant left subclavian stenosis this will be treated at that time.  If she does not have significant left subclavian stenosis I will do a left arm arteriogram to determine the cause of her discrepancy in her blood pressure.  I discussed the risks and benefits of the procedure including the risk of bleeding as well as the small risk of stroke.  This is been scheduled for Tuesday, October 23.   [name], MD [facility] [phone] Pager [phone]

## 2017-01-01 NOTE — Discharge Instructions (Signed)

## 2017-01-02 ENCOUNTER — Encounter (HOSPITAL_COMMUNITY): Payer: Self-pay | Admitting: Surgery

## 2017-01-02 ENCOUNTER — Other Ambulatory Visit: Payer: Self-pay

## 2017-01-02 DIAGNOSIS — I6502 Occlusion and stenosis of left vertebral artery: Secondary | ICD-10-CM

## 2017-01-02 DIAGNOSIS — Z48812 Encounter for surgical aftercare following surgery on the circulatory system: Secondary | ICD-10-CM

## 2017-01-02 MED FILL — Heparin Sodium (Porcine) 2 Unit/ML in Sodium Chloride 0.9%: INTRAMUSCULAR | Qty: 1000 | Status: AC

## 2017-01-02 MED FILL — Lidocaine HCl Local Inj 2%: INTRAMUSCULAR | Qty: 10 | Status: AC

## 2017-01-06 NOTE — Progress Notes (Signed)
Follow-up Outpatient Visit Date: 01/07/2017  Primary Care Provider: Esaw Grandchild, NP [place] [address]  Chief Complaint: Follow-up chest pain  HPI:  [name] is a 71 y.o. year-old female with history of peripheral vascular disease with right vertebral artery occlusion and left subclavian artery stenosis status post stenting (12/2016), stroke in 2016, hyperlipidemia, hypothyroidism, and GERD, who presents for follow-up of chest pain and dizziness. I last saw her in early September, at which time she noted continued dizziness as well as occasional "burning" in the chest with modest activity (gradually increasing over 1-2 years). She also endorsed occasional palpitations. Subsequent myocardial perfusion stress test and event monitor were normal. Evaluation by Dr. Trula Slade (vascular surgery), revealed 80% proximal left subclavian artery stenosis, which was successfully treated with stenting.  Today, [name] reports that she has been feeling relatively well.  She has not had any further dizziness since her left subclavian artery intervention last week.  Previously noted heaviness in the left arm is no longer present.  Differential blood pressures (lower on the left) have also improved.  She continues to have occasional burning in the chest with exertion.  It is not present all the time and is stable since her last visit.  Discomfort typically lasts less than 5 minutes and resolves promptly with rest.  She denies shortness of breath, palpitations, and lightheadedness.  [name] notes occasional "unusual" sensation with slight irregularity in her heartbeat, which has been present for years.  She has always attributed this to thoracic outlet syndrome, though she now questions that diagnosis.  She remains on dual antiplatelet therapy with aspirin and clopidogrel following her left subclavian artery stent last week.  He has some bruising in the groin but no pain, swelling, or  bleeding.  --------------------------------------------------------------------------------------------------  Risk Factors:  Peripheral and cerebrovascular disease, hyperlipidemia, and age greater than 61  Cath/PCI:  None  CV Surgery:  Aortography and left subclavian angiography; intervention (01/01/17, Dr. Trula Slade): Occluded right vertebral artery. 80% left subclavian artery stenosis with successful intervention with Xpress 8 x 25 mm balloon-expandable stent.  EP Procedures and Devices:  30-day event monitor (11/28/16): Sinus rhythm. No significant arrhythmia.  Non-Invasive Evaluation(s):  Exercise MPI (11/22/16): Low risk study with small in size, mild in severity, fixed apical anterior and apical defect likely representing apical thinning. LVEF >65% with normal wall motion. Patient complained of fatigue during test, exercising 5:30 minutes.  Upper extremity arterial Doppler (10/26/16): Right upper extremity evaluation normal. Left upper extremity demonstrates dampened biphasic to monophasic waveforms and 20 mmHg pressure difference compared to the right.  Carotid artery duplex (10/18/16): Bilateral 1-39% stenoses involving the right and left internal carotid arteries.  Transthoracic echocardiogram (12/28/14): Normal LV size and function. LVEF 60-65% with grade 1 diastolic dysfunction. Normal RV size and function. No significant vascular abnormalities.  Recent CV Pertinent Labs: Lab Results  Component Value Date   CHOL 183 09/13/2016   HDL 65 09/13/2016   LDLCALC 100 (H) 09/13/2016   TRIG 89 09/13/2016   CHOLHDL 2.8 09/13/2016   CHOLHDL 3.9 12/27/2014   INR 0.95 12/26/2014   K 4.1 01/01/2017   BUN 13 01/01/2017   BUN 17 09/13/2016   CREATININE 0.70 01/01/2017    Past medical and surgical history were reviewed and updated in EPIC.  Current Meds  Medication Sig  . acetaminophen (TYLENOL) 500 MG tablet Take 1,000 mg by mouth daily as needed for moderate pain or  headache.  Francia Greaves THYROID 60  MG tablet Take 1 tablet by mouth daily before breakfast.   . aspirin EC 81 MG tablet Take 81 mg by mouth daily.  . Calcium Carb-Cholecalciferol (CALCIUM + D3) 600-200 MG-UNIT TABS Take 1 tablet by mouth 4 (four) times a week.   . clopidogrel (PLAVIX) 75 MG tablet Take 1 tablet (75 mg total) by mouth daily.  . diazepam (VALIUM) 5 MG tablet Take 5 mg by mouth at bedtime as needed (sleep).   . diphenhydrAMINE (BENADRYL) 25 MG tablet Take 25 mg by mouth at bedtime as needed for sleep.  . dorzolamide (TRUSOPT) 2 % ophthalmic solution Place 1 drop into both eyes daily.   Marland Kitchen latanoprost (XALATAN) 0.005 % ophthalmic solution Place 1 drop into both eyes at bedtime.  . Multiple Vitamin (MULTIVITAMIN) tablet Take 1 tablet by mouth daily.  Marland Kitchen thyroid (ARMOUR) 15 MG tablet Take 15 mg by mouth. Take with 60 mg tablet 4 times per week (Fri, Sat, Sun, Mon)  . [DISCONTINUED] rosuvastatin (CRESTOR) 5 MG tablet TAKE 1 TABLET BY MOUTH EVERY DAY    Allergies: Neuromuscular blocking agents and Vicodin [hydrocodone-acetaminophen]  Social History   Social History  . Marital status: Married    Spouse name: N/A  . Number of children: 1  . Years of education: N/A   Occupational History  . 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 Smoking status: Former Smoker    Packs/day: 0.25    Types: Cigarettes    Quit date: 03/12/1978  . Smokeless tobacco: Never Used     Comment: quit in 1978  . Alcohol use 4.2 oz/week    7 Glasses of wine per week  . Drug use: No  . Sexual activity: Not Currently    Birth control/ protection: None   Other Topics Concern  . Not on file   Social History Narrative  . No narrative on file    Family History  Problem Relation Age of Onset  . Other Father 36       SEPSIS  . Hypertension Father   . Stroke Father 55       CVA  . Heart failure Mother 17  . Heart murmur Mother   . Heart attack Sister 25    . Diabetes Brother   . Breast cancer Sister   . Stroke Sister   . Cirrhosis Brother   . Hypertension Sister        x 2  . Diabetes Sister   . Heart murmur Daughter     Review of Systems: A 12-system review of systems was performed and was negative except as noted in the HPI.  --------------------------------------------------------------------------------------------------  Physical Exam: BP (!) 108/56 (BP Location: Right Leg, Patient Position: Sitting, Cuff Size: Normal) Comment: left arm : 102/60  Pulse 89   Ht 5\' 2"  (1.575 m)   Wt 125 lb 9.6 oz (57 kg)   SpO2 98%   BMI 22.97 kg/m   General: Well-developed, well-nourished woman, seated comfortably in the exam room. HEENT: No conjunctival pallor or scleral icterus. Moist mucous membranes.  OP clear. Neck: Supple without lymphadenopathy, thyromegaly, JVD, or HJR. No carotid bruit. Lungs: Normal work of breathing. Clear to auscultation bilaterally without wheezes or crackles. Heart: Regular rate and rhythm without murmurs, rubs, or gallops. Non-displaced PMI. Abd: Bowel sounds present. Soft, NT/ND without hepatosplenomegaly Ext: No lower extremity edema. Radial, PT, and DP pulses are 2+ bilaterally.  Wheezing noted in  the right groin without hematoma or bruit.  2+ right femoral artery pulse. Skin: Warm and dry without rash.   Lab Results  Component Value Date   WBC 4.2 09/13/2016   HGB 15.3 (H) 01/01/2017   HCT 45.0 01/01/2017   MCV 90 09/13/2016   PLT 269 09/13/2016    Lab Results  Component Value Date   NA 138 01/01/2017   K 4.1 01/01/2017   CL 101 01/01/2017   CO2 23 09/13/2016   BUN 13 01/01/2017   CREATININE 0.70 01/01/2017   GLUCOSE 96 01/01/2017   ALT 20 09/13/2016    Lab Results  Component Value Date   CHOL 183 09/13/2016   HDL 65 09/13/2016   LDLCALC 100 (H) 09/13/2016   TRIG 89 09/13/2016   CHOLHDL 2.8 09/13/2016     --------------------------------------------------------------------------------------------------  ASSESSMENT AND PLAN: Stable angina Ms. Camberos reports exertional burning in her chest for the last several years.  This has been stable since her last visit.  Myocardial perfusion stress test was low risk with a small fixed apical and apical anterior defect most likely representing artifact.  We discussed addition of antianginal therapy, including beta-blocker, long-acting nitrate, and renal was seen.  [name] states that she is not significantly limited by her pain does not wish to start a medication at this time.  However, she will do some research about the medications and let us know if she wishes to try.  Peripheral vascular disease [name] has known right vertebral artery occlusion and was recently found to have high-grade stenosis of the left subclavian artery.  She underwent successful stenting by Dr. Trula Slade and has noted some improvement in both her dizziness and left arm heaviness.  She should continue with dual antiplatelet therapy, as directed by Dr. Trula Slade, as well as lipid therapy (see details below).  Bruising is noted at right femoral arteriotomy site, but the site is otherwise benign without evidence of hematoma or bruit.  Palpitations and history of stroke [name] notes minimal palpitations since her last visit.  30-day event monitor did not show any arrhythmias.  No further arrhythmia workup at this time.  Continue secondary prevention of stroke.  Hyperlipidemia (goal LDL less than 70) Most recent LDL this summer was 100.  [name] seems to be tolerating rosuvastatin reasonably well at this time.  I have asked her to increase rosuvastatin to 10 mg daily in an attempt to optimize her LDL.  If she is intolerant of this higher dose, will need to consider PCSK9 inhibitor.  We will repeat a lipid panel and ALT in 3 months.  Follow-up: Return to clinic in 3  months.  Nelva Bush, MD 01/07/2017 1:47 PM

## 2017-01-07 ENCOUNTER — Encounter: Payer: Self-pay | Admitting: Internal Medicine

## 2017-01-07 ENCOUNTER — Ambulatory Visit (INDEPENDENT_AMBULATORY_CARE_PROVIDER_SITE_OTHER): Payer: Medicare HMO | Admitting: Internal Medicine

## 2017-01-07 VITALS — BP 108/56 | HR 89 | Ht 62.0 in | Wt 125.6 lb

## 2017-01-07 DIAGNOSIS — Z8673 Personal history of transient ischemic attack (TIA), and cerebral infarction without residual deficits: Secondary | ICD-10-CM | POA: Diagnosis not present

## 2017-01-07 DIAGNOSIS — I739 Peripheral vascular disease, unspecified: Secondary | ICD-10-CM | POA: Diagnosis not present

## 2017-01-07 DIAGNOSIS — R002 Palpitations: Secondary | ICD-10-CM | POA: Diagnosis not present

## 2017-01-07 DIAGNOSIS — I208 Other forms of angina pectoris: Secondary | ICD-10-CM

## 2017-01-07 DIAGNOSIS — I2089 Other forms of angina pectoris: Secondary | ICD-10-CM

## 2017-01-07 DIAGNOSIS — E785 Hyperlipidemia, unspecified: Secondary | ICD-10-CM | POA: Diagnosis not present

## 2017-01-07 MED ORDER — ROSUVASTATIN CALCIUM 10 MG PO TABS: 10.0000 mg | ORAL_TABLET | Freq: Every day | ORAL | 1 refills | Status: DC

## 2017-01-07 NOTE — Patient Instructions (Signed)
Medication Instructions:  Increase crestor (rosuvastatin) to 10 mg daily. You can take 2 of your 5 mg tablets daily at the same time and use your current supply.  Labwork: Your physician recommends that you return for a FASTING lipid profile /ALT in about 3 months a few days before your appointment with Dr End in 3 months.         Isosorbide Mononitrate extended-release tablets What is this medicine? ISOSORBIDE MONONITRATE (eye soe SOR bide mon oh NYE trate) is a vasodilator. It relaxes blood vessels, increasing the blood and oxygen supply to your heart. This medicine is used to prevent chest pain caused by angina. It will not help to stop an episode of chest pain. This medicine may be used for other purposes; ask your health care provider or pharmacist if you have questions. COMMON BRAND NAME(S): Imdur, Isotrate ER What should I tell my health care provider before I take this medicine? They need to know if you have any of these conditions: -previous heart attack or heart failure -an unusual or allergic reaction to isosorbide mononitrate, nitrates, other medicines, foods, dyes, or preservatives -pregnant or trying to get pregnant -breast-feeding How should I use this medicine? Take this medicine by mouth with a glass of water. Follow the directions on the prescription label. Do not crush or chew. Take your medicine at regular intervals. Do not take your medicine more often than directed. Do not stop taking this medicine except on the advice of your doctor or health care professional. Talk to your pediatrician regarding the use of this medicine in children. Special care may be needed. Overdosage: If you think you have taken too much of this medicine contact a poison control center or emergency room at once. NOTE: This medicine is only for you. Do not share this medicine with others. What if I miss a dose? If you miss a dose, take it as soon as you can. If it is almost time for your next  dose, take only that dose. Do not take double or extra doses. What may interact with this medicine? Do not take this medicine with any of the following medications: -medicines used to treat erectile dysfunction (ED) like avanafil, sildenafil, tadalafil, and vardenafil -riociguat This medicine may also interact with the following medications: -medicines for high blood pressure -other medicines for angina or heart failure This list may not describe all possible interactions. Give your health care provider a list of all the medicines, herbs, non-prescription drugs, or dietary supplements you use. Also tell them if you smoke, drink alcohol, or use illegal drugs. Some items may interact with your medicine. What should I watch for while using this medicine? Check your heart rate and blood pressure regularly while you are taking this medicine. Ask your doctor or health care professional what your heart rate and blood pressure should be and when you should contact him or her. Tell your doctor or health care professional if you feel your medicine is no longer working. You may get dizzy. Do not drive, use machinery, or do anything that needs mental alertness until you know how this medicine affects you. To reduce the risk of dizzy or fainting spells, do not sit or stand up quickly, especially if you are an older patient. Alcohol can make you more dizzy, and increase flushing and rapid heartbeats. Avoid alcoholic drinks. Do not treat yourself for coughs, colds, or pain while you are taking this medicine without asking your doctor or health care professional for advice.  Some ingredients may increase your blood pressure. What side effects may I notice from receiving this medicine? Side effects that you should report to your doctor or health care professional as soon as possible: -bluish discoloration of lips, fingernails, or palms of hands -irregular heartbeat, palpitations -low blood pressure -nausea,  vomiting -persistent headache -unusually weak or tired Side effects that usually do not require medical attention (report to your doctor or health care professional if they continue or are bothersome): -flushing of the face or neck -rash This list may not describe all possible side effects. Call your doctor for medical advice about side effects. You may report side effects to FDA at 1-800-FDA-1088. Where should I keep my medicine? Keep out of the reach of children. Store between 15 and 30 degrees C (59 and 86 degrees F). Keep container tightly closed. Throw away any unused medicine after the expiration date. NOTE: This sheet is a summary. It may not cover all possible information. If you have questions about this medicine, talk to your doctor, pharmacist, or health care provider.  2018 Elsevier/Gold Standard (2012-12-26 14:48:19)     Ranolazine tablets, extended release What is this medicine? RANOLAZINE (ra NOE la zeen) is a heart medicine. It is used to treat chronic chest pain (angina). This medicine must be taken regularly. It will not relieve an acute episode of chest pain. This medicine may be used for other purposes; ask your health care provider or pharmacist if you have questions. COMMON BRAND NAME(S): Ranexa What should I tell my health care provider before I take this medicine? They need to know if you have any of these conditions: -heart disease -irregular heartbeat -kidney disease -liver disease -low levels of potassium or magnesium in the blood -an unusual or allergic reaction to ranolazine, other medicines, foods, dyes, or preservatives -pregnant or trying to get pregnant -breast-feeding How should I use this medicine? Take this medicine by mouth with a glass of water. Follow the directions on the prescription label. Do not cut, crush, or chew this medicine. Take with or without food. Do not take this medication with grapefruit juice. Take your doses at regular  intervals. Do not take your medicine more often then directed. Talk to your pediatrician regarding the use of this medicine in children. Special care may be needed. Overdosage: If you think you have taken too much of this medicine contact a poison control center or emergency room at once. NOTE: This medicine is only for you. Do not share this medicine with others. What if I miss a dose? If you miss a dose, take it as soon as you can. If it is almost time for your next dose, take only that dose. Do not take double or extra doses. What may interact with this medicine? Do not take this medicine with any of the following medications: -antivirals for HIV or AIDS -cerivastatin -certain antibiotics like chloramphenicol, clarithromycin, dalfopristin; quinupristin, isoniazid, rifabutin, rifampin, rifapentine -certain medicines used for cancer like imatinib, nilotinib -certain medicines for fungal infections like fluconazole, itraconazole, ketoconazole, posaconazole, voriconazole -certain medicines for irregular heart beat like dofetilide, dronedarone -certain medicines for seizures like carbamazepine, fosphenytoin, oxcarbazepine, phenobarbital, phenytoin -cisapride -conivaptan -cyclosporine -grapefruit or grapefruit juice -lumacaftor; ivacaftor -nefazodone -pimozide -quinacrine -St John's wort -thioridazine -ziprasidone This medicine may also interact with the following medications: -alfuzosin -certain medicines for depression, anxiety, or psychotic disturbances like bupropion, citalopram, fluoxetine, fluphenazine, paroxetine, perphenazine, risperidone, sertraline, trifluoperazine -certain medicines for cholesterol like atorvastatin, lovastatin, simvastatin -certain medicines for stomach problems like  octreotide, palonosetron, prochlorperazine -eplerenone -ergot alkaloids like dihydroergotamine, ergonovine, ergotamine, methylergonovine -metformin -nicardipine -other medicines that prolong  the QT interval (cause an abnormal heart rhythm) -sirolimus -tacrolimus This list may not describe all possible interactions. Give your health care provider a list of all the medicines, herbs, non-prescription drugs, or dietary supplements you use. Also tell them if you smoke, drink alcohol, or use illegal drugs. Some items may interact with your medicine. What should I watch for while using this medicine? Visit your doctor for regular check ups. Tell your doctor or healthcare professional if your symptoms do not start to get better or if they get worse. This medicine will not relieve an acute attack of angina or chest pain. This medicine can change your heart rhythm. Your health care provider may check your heart rhythm by ordering an electrocardiogram (ECG) while you are taking this medicine. You may get drowsy or dizzy. Do not drive, use machinery, or do anything that needs mental alertness until you know how this medicine affects you. Do not stand or sit up quickly, especially if you are an older patient. This reduces the risk of dizzy or fainting spells. Alcohol may interfere with the effect of this medicine. Avoid alcoholic drinks. If you are scheduled for any medical or dental procedure, tell your healthcare provider that you are taking this medicine. This medicine can interact with other medicines used during surgery. What side effects may I notice from receiving this medicine? Side effects that you should report to your doctor or health care professional as soon as possible: -allergic reactions like skin rash, itching or hives, swelling of the face, lips, or tongue -breathing problems -changes in vision -fast, irregular or pounding heartbeat -feeling faint or lightheaded, falls -low or high blood pressure -numbness or tingling feelings -ringing in the ears -tremor or shakiness -slow heartbeat (fewer than 50 beats per minute) -swelling of the legs or feet Side effects that usually do  not require medical attention (report to your doctor or health care professional if they continue or are bothersome): -constipation -drowsy -dry mouth -headache -nausea or vomiting -stomach upset This list may not describe all possible side effects. Call your doctor for medical advice about side effects. You may report side effects to FDA at 1-800-FDA-1088. Where should I keep my medicine? Keep out of the reach of children. Store at room temperature between 15 and 30 degrees C (59 and 86 degrees F). Throw away any unused medicine after the expiration date. NOTE: This sheet is a summary. It may not cover all possible information. If you have questions about this medicine, talk to your doctor, pharmacist, or health care provider.  2018 Elsevier/Gold Standard (2015-03-31 12:24:15)       Metoprolol tablets What is this medicine? METOPROLOL (me TOE proe lole) is a beta-blocker. Beta-blockers reduce the workload on the heart and help it to beat more regularly. This medicine is used to treat high blood pressure and to prevent chest pain. It is also used to after a heart attack and to prevent an additional heart attack from occurring. This medicine may be used for other purposes; ask your health care provider or pharmacist if you have questions. COMMON BRAND NAME(S): Lopressor What should I tell my health care provider before I take this medicine? They need to know if you have any of these conditions: -diabetes -heart or vessel disease like slow heart rate, worsening heart failure, heart block, sick sinus syndrome or Raynaud's disease -kidney disease -liver disease -lung or  breathing disease, like asthma or emphysema -pheochromocytoma -thyroid disease -an unusual or allergic reaction to metoprolol, other beta-blockers, medicines, foods, dyes, or preservatives -pregnant or trying to get pregnant -breast-feeding How should I use this medicine? Take this medicine by mouth with a drink of  water. Follow the directions on the prescription label. Take this medicine immediately after meals. Take your doses at regular intervals. Do not take more medicine than directed. Do not stop taking this medicine suddenly. This could lead to serious heart-related effects. Talk to your pediatrician regarding the use of this medicine in children. Special care may be needed. Overdosage: If you think you have taken too much of this medicine contact a poison control center or emergency room at once. NOTE: This medicine is only for you. Do not share this medicine with others. What if I miss a dose? If you miss a dose, take it as soon as you can. If it is almost time for your next dose, take only that dose. Do not take double or extra doses. What may interact with this medicine? This medicine may interact with the following medications: -certain medicines for blood pressure, heart disease, irregular heart beat -certain medicines for depression like monoamine oxidase (MAO) inhibitors, fluoxetine, or paroxetine -clonidine -dobutamine -epinephrine -isoproterenol -reserpine This list may not describe all possible interactions. Give your health care provider a list of all the medicines, herbs, non-prescription drugs, or dietary supplements you use. Also tell them if you smoke, drink alcohol, or use illegal drugs. Some items may interact with your medicine. What should I watch for while using this medicine? Visit your doctor or health care professional for regular check ups. Contact your doctor right away if your symptoms worsen. Check your blood pressure and pulse rate regularly. Ask your health care professional what your blood pressure and pulse rate should be, and when you should contact them. You may get drowsy or dizzy. Do not drive, use machinery, or do anything that needs mental alertness until you know how this medicine affects you. Do not sit or stand up quickly, especially if you are an older patient.  This reduces the risk of dizzy or fainting spells. Contact your doctor if these symptoms continue. Alcohol may interfere with the effect of this medicine. Avoid alcoholic drinks. What side effects may I notice from receiving this medicine? Side effects that you should report to your doctor or health care professional as soon as possible: -allergic reactions like skin rash, itching or hives -cold or numb hands or feet -depression -difficulty breathing -faint -fever with sore throat -irregular heartbeat, chest pain -rapid weight gain -swollen legs or ankles Side effects that usually do not require medical attention (report to your doctor or health care professional if they continue or are bothersome): -anxiety or nervousness -change in sex drive or performance -dry skin -headache -nightmares or trouble sleeping -short term memory loss -stomach upset or diarrhea -unusually tired This list may not describe all possible side effects. Call your doctor for medical advice about side effects. You may report side effects to FDA at 1-800-FDA-1088. Where should I keep my medicine? Keep out of the reach of children. Store at room temperature between 15 and 30 degrees C (59 and 86 degrees F). Throw away any unused medicine after the expiration date. NOTE: This sheet is a summary. It may not cover all possible information. If you have questions about this medicine, talk to your doctor, pharmacist, or health care provider.  2018 Elsevier/Gold Standard (2012-10-31 14:40:36)  Do not eat or drink after midnight the night before your lab. The lab is open from 7:30 AM-5 PM.   Testing/Procedures: None  Follow-Up: Your physician recommends that you schedule a follow-up appointment in: about 3 months with Dr ENd        If you need a refill on your cardiac medications before your next appointment, please call your pharmacy.

## 2017-01-08 DIAGNOSIS — I208 Other forms of angina pectoris: Secondary | ICD-10-CM | POA: Insufficient documentation

## 2017-01-08 DIAGNOSIS — Z8673 Personal history of transient ischemic attack (TIA), and cerebral infarction without residual deficits: Secondary | ICD-10-CM | POA: Insufficient documentation

## 2017-01-15 ENCOUNTER — Telehealth: Payer: Self-pay | Admitting: *Deleted

## 2017-01-15 NOTE — Telephone Encounter (Addendum)
Left message with patient about STROKE-[employer] research study updated ICF mailed to her. Informed her if she would like to continue please sign, date in marked areas and mail back to research office with provided envelope. All follow up visit will be completed via phone. Instructed to call office for any questions.   Update: Received patient signed ICF (IRB dated 26/SEP/2018) version 3  on 20/Nov/2018. I will mail Patient a copy for her records.

## 2017-01-22 ENCOUNTER — Encounter: Payer: Medicare HMO | Admitting: Adult Health

## 2017-02-11 ENCOUNTER — Encounter: Payer: Self-pay | Admitting: Surgery

## 2017-02-11 ENCOUNTER — Ambulatory Visit (HOSPITAL_COMMUNITY)
Admit: 2017-02-11 | Discharge: 2017-02-11 | Disposition: A | Discharge status: Home / Self Care | Payer: Medicare HMO | Attending: Surgery | Admitting: Surgery

## 2017-02-11 ENCOUNTER — Ambulatory Visit: Payer: Medicare HMO | Admitting: Surgery

## 2017-02-11 VITALS — BP 127/79 | HR 77 | Temp 97.1°F | Resp 18 | Ht 62.0 in | Wt 128.1 lb

## 2017-02-11 DIAGNOSIS — Z48812 Encounter for surgical aftercare following surgery on the circulatory system: Secondary | ICD-10-CM | POA: Diagnosis not present

## 2017-02-11 DIAGNOSIS — I6502 Occlusion and stenosis of left vertebral artery: Secondary | ICD-10-CM | POA: Insufficient documentation

## 2017-02-11 DIAGNOSIS — G458 Other transient cerebral ischemic attacks and related syndromes: Secondary | ICD-10-CM | POA: Diagnosis not present

## 2017-02-11 DIAGNOSIS — I6523 Occlusion and stenosis of bilateral carotid arteries: Secondary | ICD-10-CM | POA: Diagnosis not present

## 2017-02-11 LAB — VAS US CAROTID
LCCADSYS: -106 cm/s
LCCAPDIAS: 32 cm/s
LCCAPSYS: 92 cm/s
LEFT ECA DIAS: -13 cm/s
Left CCA dist dias: -31 cm/s
Left ICA dist dias: -33 cm/s
Left ICA dist sys: -80 cm/s
Left ICA prox dias: -22 cm/s
Left ICA prox sys: -71 cm/s
RCCADSYS: -65 cm/s
RCCAPDIAS: 22 cm/s
RIGHT CCA MID DIAS: -19 cm/s
RIGHT ECA DIAS: -14 cm/s
Right CCA prox sys: 72 cm/s

## 2017-02-11 NOTE — Progress Notes (Signed)
[facility] [place]  Patient name: [name] MRN: [id number] DOB: [birth year] Sex: female   REASON FOR VISIT:    Follow up  HISOTRY OF PRESENT ILLNESS:    [name] is a 71 y.o. female who I initially saw for evaluation of asymmetric blood pressures.  The patient initially began having problems in April or she became lightheaded.  She checked her blood pressure and found that it was lower and the left arm measuring 92/68.  Her right arm remained normal with systolic pressures in the 120s.  She went on to have a duplex ultrasound which showed 1-39 percent lateral carotid stenosis with irregular blood flow in the vertebral arteries.  The patient does not state that she knows what her symptoms will occur.  She does say her left arm will fatigue at times.  She does have a history of stroke approximately 2 years ago secondary to a right vertebral artery occlusion.  She is managed for hypercholesterolemia with a statin.  She is a former smoker  She ended up going for arteriogram on 01/01/2017.  A greater than 80% stenosis was identified in the proximal portion of the left subclavian, proximal to the vertebral artery.  This was treated with primary stenting using 8 x 25 balloon expandable stent.  She has not had any symptoms.  PAST MEDICAL HISTORY:   Past Medical History:  Diagnosis Date  . Anxiety   . Barrett esophagus 2003  . Diverticulitis   . Diverticulosis   . GERD (gastroesophageal reflux disease)   . Glaucoma   . Grave's disease   . H. pylori infection   . Hepatitis A    in her 42 from food  . Hyperlipidemia   . Hypothyroidism   . Increased pressure in the eye   . Insomnia   . Morton's neuroma   . Murmur   . Scoliosis   . Stroke [facility]) 12/2014   no deficits     FAMILY HISTORY:   Family History  Problem Relation Age of Onset  . Other Father 75       SEPSIS  . Hypertension Father   . Stroke Father 32       CVA  . Heart failure Mother 79  . Heart murmur Mother   . Heart attack Sister 22  . Diabetes Brother   . Breast cancer Sister   . Stroke Sister   . Cirrhosis Brother   . Hypertension Sister        x 2  . Diabetes Sister   . Heart murmur Daughter     SOCIAL HISTORY:   Social History   Tobacco Use  . Smoking status: Former Smoker    Packs/day: 0.25    Types: Cigarettes    Last attempt to quit: 03/12/1978    Years since quitting: 38.9  . Smokeless tobacco: Never Used  . Tobacco comment: quit in 1978  Substance Use Topics  . Alcohol use: Yes    Alcohol/week: 4.2 oz    Types: 7 Glasses of wine per week     ALLERGIES:   Allergies  Allergen Reactions  . Neuromuscular Blocking Agents Other (See Comments)    Multiple symptoms, SOB, fatigue, weakness  . Vicodin [Hydrocodone-Acetaminophen]     Altered mental state     CURRENT MEDICATIONS:   Current Outpatient Medications  Medication Sig Dispense Refill  . acetaminophen (TYLENOL) 500 MG tablet Take 1,000 mg by mouth daily as needed for moderate pain or  headache.    Francia Greaves THYROID 60 MG tablet Take 1 tablet by mouth daily before breakfast.     . aspirin EC 81 MG tablet Take 81 mg by mouth daily.    . Calcium Carb-Cholecalciferol (CALCIUM + D3) 600-200 MG-UNIT TABS Take 1 tablet by mouth 4 (four) times a week.     . clopidogrel (PLAVIX) 75 MG tablet Take 1 tablet (75 mg total) by mouth daily. 30 tablet 6  . diazepam (VALIUM) 5 MG tablet Take 5 mg by mouth at bedtime as needed (sleep).     . diphenhydrAMINE (BENADRYL) 25 MG tablet Take 25 mg by mouth at bedtime as needed for sleep.    . dorzolamide (TRUSOPT) 2 % ophthalmic solution Place 1 drop into both eyes daily.     Marland Kitchen latanoprost (XALATAN) 0.005 % ophthalmic solution Place 1 drop into both eyes at bedtime.    . Multiple Vitamin (MULTIVITAMIN) tablet Take 1 tablet by mouth daily.    . rosuvastatin (CRESTOR) 10 MG tablet Take 1 tablet (10 mg total) by mouth daily. 90  tablet 1  . thyroid (ARMOUR) 15 MG tablet Take 15 mg by mouth. Take with 60 mg tablet 4 times per week (Fri, Sat, Sun, Mon)     No current facility-administered medications for this visit.     REVIEW OF SYSTEMS:   [X]  denotes positive finding, [ ]  denotes negative finding Cardiac  Comments:  Chest pain or chest pressure:    Shortness of breath upon exertion:    Short of breath when lying flat:    Irregular heart rhythm:        Vascular    Pain in calf, thigh, or hip brought on by ambulation:    Pain in feet at night that wakes you up from your sleep:     Blood clot in your veins:    Leg swelling:         Pulmonary    Oxygen at home:    Productive cough:     Wheezing:         Neurologic    Sudden weakness in arms or legs:     Sudden numbness in arms or legs:     Sudden onset of difficulty speaking or slurred speech:    Temporary loss of vision in one eye:     Problems with dizziness:         Gastrointestinal    Blood in stool:     Vomited blood:         Genitourinary    Burning when urinating:     Blood in urine:        Psychiatric    Major depression:         Hematologic    Bleeding problems:    Problems with blood clotting too easily:        Skin    Rashes or ulcers:        Constitutional    Fever or chills:      PHYSICAL EXAM:   Vitals:   02/11/17 1510 02/11/17 1511  BP: 136/80 127/79  Pulse: 77   Resp: 18   Temp: (!) 97.1 F (36.2 C)   TempSrc: Oral   SpO2: 100%   Weight: 128 lb 1.6 oz (58.1 kg)   Height: 5\' 2"  (1.575 m)     GENERAL: The patient is a well-nourished female, in no acute distress. The vital signs are documented above. CARDIAC: There is a regular rate and  rhythm.  PULMONARY: Non-labored respirations MUSCULOSKELETAL: There are no major deformities or cyanosis. NEUROLOGIC: No focal weakness or paresthesias are detected. SKIN: There are no ulcers or rashes noted. PSYCHIATRIC: The patient has a normal affect.  STUDIES:   I  have ordered and reviewed her ultrasound.  No significant carotid stenosis was identified.  The left vertebral artery is antegrade.  The right is retrograde.  MEDICAL ISSUES:   Status post left carotid stenting.  The patient's symptoms of vertebrobasilar insufficiency have resolved.  I will continue to monitor her stent with serial ultrasounds.  The next will be in 6 months.  I encouraged her to continue aspirin and Plavix as dual antiplatelet therapy.    [name], MD [facility] [phone] Pager [phone]

## 2017-02-22 NOTE — Addendum Note (Signed)
Addended by: Lianne Cure A on: 02/22/2017 11:57 AM   Modules accepted: Orders

## 2017-02-24 NOTE — Progress Notes (Signed)
Subjective:    Patient ID: [name], female    DOB: 1945/09/03, 71 y.o.   MRN: [id number]  HPI:  [name] presents for CPE.   10/09/16 seen by neurology/Dr. Leonie Fox- recommended to stop Clopidogrel 75mg  and start ASA 325mg  daily once she completed current supply of Rx 11/16/16 seen by cards/[name] and it was recommended that she remain on Clopidogrel 75mg  daily instead of changing to ASA 325mg  daily. She is currently on Clopidogrel 75mg  and tolerating well She underwent successful L subclavian stent 01/01/17 and was seen by vascular surgeon 02/11/17. She was last seen by cards 01/07/17, her LDL goal >70 and they increased her rosuvastatin to 10mg /daily- she is tolerating well. Cards will re-check her lipids 03/2017 She is experiencing emotional liability- specifically irritability and "feeling more tearful" than normal- worsening over the last few months. She denies thoughts of harming herself/others and she has been on Fluoxetine in the past and tolerated well. She also reports low labido, vaginal dryness, and "full/heavy breasts"- all worsening over the last year. She estimates to drink 40-50 oz water/day, follows heart healthy diet, and practices yoga 3-4 times/week. She also reports insomnia and excessive worry when she wakes early am.  Healthcare Maintenance: PAP-not indicated, hx of hysterectomy Mammogram-next due 09/2018 Colonoscopy- last completed 6/18- benign polyps removed, next due 2028  Review of Systems  Constitutional: Positive for fatigue. Negative for activity change, appetite change, chills, diaphoresis, fever and unexpected weight change.  HENT: Negative for congestion.   Eyes: Negative for visual disturbance.  Respiratory: Negative for cough, chest tightness, shortness of breath, wheezing and stridor.   Cardiovascular: Negative for chest pain, palpitations and leg swelling.  Gastrointestinal: Negative for abdominal distention, blood in stool, constipation, diarrhea,  nausea and vomiting.  Endocrine: Negative for cold intolerance, heat intolerance, polydipsia, polyphagia and polyuria.  Genitourinary: Negative for difficulty urinating, flank pain and hematuria.  Musculoskeletal: Negative for arthralgias, back pain, gait problem, joint swelling, myalgias, neck pain and neck stiffness.  Neurological: Negative for dizziness, weakness and headaches.  Hematological: Does not bruise/bleed easily.  Psychiatric/Behavioral: Positive for sleep disturbance. Negative for self-injury. The patient is nervous/anxious. The patient is not hyperactive.        Objective:   Physical Exam  Constitutional: She is oriented to person, place, and time. She appears well-developed and well-nourished. No distress.  HENT:  Head: Normocephalic and atraumatic.  Right Ear: Hearing, tympanic membrane, external ear and ear canal normal. Tympanic membrane is not erythematous and not bulging. No decreased hearing is noted.  Left Ear: Hearing, tympanic membrane, external ear and ear canal normal. Tympanic membrane is not erythematous and not bulging. No decreased hearing is noted.  Nose: Nose normal. No mucosal edema. Right sinus exhibits no maxillary sinus tenderness and no frontal sinus tenderness. Left sinus exhibits no maxillary sinus tenderness and no frontal sinus tenderness.  Mouth/Throat: Uvula is midline, oropharynx is clear and moist and mucous membranes are normal.  Eyes: Conjunctivae are normal. Pupils are equal, round, and reactive to light.  Neck: Normal range of motion. Neck supple.  Cardiovascular: Normal rate, regular rhythm, normal heart sounds and intact distal pulses.  No murmur heard. Pulmonary/Chest: Effort normal and breath sounds normal. No respiratory distress. She has no decreased breath sounds. She has no wheezes. She has no rhonchi. She has no rales. She exhibits no tenderness. Right breast exhibits no inverted nipple, no mass, no nipple discharge and no skin change.  Left breast exhibits no inverted nipple, no mass,  no nipple discharge and no skin change.    Fibrous breast tissue- L breat 9 o clock position  Abdominal: Soft. Bowel sounds are normal. She exhibits no distension and no mass. There is no tenderness. There is no rebound and no guarding.  Musculoskeletal: Normal range of motion. She exhibits no edema or tenderness.  Lymphadenopathy:    She has no cervical adenopathy.  Neurological: She is alert and oriented to person, place, and time. Coordination normal.  Skin: Skin is warm and dry. No rash noted. She is not diaphoretic. No erythema. No pallor.  Psychiatric: She has a normal mood and affect. Her behavior is normal. Judgment and thought content normal.  Nursing note and vitals reviewed.         Assessment & Plan:   1. Vaginal dryness   2. Other specified hypotension   3. Health care maintenance   4. Depression, recurrent (HCC)     Hypotension BP stable 121/75, HR 87 Denies acute cardiac sx's.  Health care maintenance Increase water intake, strive for at least 64 ounces/day.   Follow Heart Healthy diet Increase regular exercise.  Recommend at least 30 minutes daily, 5 days per week of walking, jogging, biking, swimming, YouTube/Pinterest workout videos.   Vaginal dryness Referral to OB/GYN placed  Depression, recurrent ([place]) Please start once daily Prozac 20mg  and follow-up in 4 weeks. Declined CBT referral today.    FOLLOW-UP:  Return in about 4 weeks (around 03/25/2017) for Evaluate Medication Effectiveness, Depression.

## 2017-02-25 ENCOUNTER — Ambulatory Visit (INDEPENDENT_AMBULATORY_CARE_PROVIDER_SITE_OTHER): Payer: Medicare HMO | Admitting: Adult Health

## 2017-02-25 ENCOUNTER — Encounter: Payer: Self-pay | Admitting: Adult Health

## 2017-02-25 VITALS — BP 121/75 | HR 87 | Ht 62.0 in | Wt 126.4 lb

## 2017-02-25 DIAGNOSIS — N898 Other specified noninflammatory disorders of vagina: Secondary | ICD-10-CM | POA: Diagnosis not present

## 2017-02-25 DIAGNOSIS — F339 Major depressive disorder, recurrent, unspecified: Secondary | ICD-10-CM | POA: Diagnosis not present

## 2017-02-25 DIAGNOSIS — I9589 Other hypotension: Secondary | ICD-10-CM | POA: Diagnosis not present

## 2017-02-25 DIAGNOSIS — R69 Illness, unspecified: Secondary | ICD-10-CM | POA: Diagnosis not present

## 2017-02-25 DIAGNOSIS — Z0000 Encounter for general adult medical examination without abnormal findings: Secondary | ICD-10-CM

## 2017-02-25 MED ORDER — FLUOXETINE HCL 20 MG PO CAPS: 20.0000 mg | ORAL_CAPSULE | Freq: Every day | ORAL | 1 refills | Status: DC

## 2017-02-25 NOTE — Assessment & Plan Note (Signed)
Increase water intake, strive for at least 64 ounces/day.   Follow Heart Healthy diet Increase regular exercise.  Recommend at least 30 minutes daily, 5 days per week of walking, jogging, biking, swimming, YouTube/Pinterest workout videos.

## 2017-02-25 NOTE — Patient Instructions (Addendum)
Heart-Healthy Eating Plan Many factors influence your heart health, including eating and exercise habits. Heart (coronary) risk increases with abnormal blood fat (lipid) levels. Heart-healthy meal planning includes limiting unhealthy fats, increasing healthy fats, and making other small dietary changes. This includes maintaining a healthy body weight to help keep lipid levels within a normal range. What is my plan? Your health care provider recommends that you:  Get no more than ___25__% of the total calories in your daily diet from fat.  Limit your intake of saturated fat to less than __5____% of your total calories each day.  Limit the amount of cholesterol in your diet to less than __300___ mg per day.  What types of fat should I choose?  Choose healthy fats more often. Choose monounsaturated and polyunsaturated fats, such as olive oil and canola oil, flaxseeds, walnuts, almonds, and seeds.  Eat more omega-3 fats. Good choices include salmon, mackerel, sardines, tuna, flaxseed oil, and ground flaxseeds. Aim to eat fish at least two times each week.  Limit saturated fats. Saturated fats are primarily found in animal products, such as meats, butter, and cream. Plant sources of saturated fats include palm oil, palm kernel oil, and coconut oil.  Avoid foods with partially hydrogenated oils in them. These contain trans fats. Examples of foods that contain trans fats are stick margarine, some tub margarines, cookies, crackers, and other baked goods. What general guidelines do I need to follow?  Check food labels carefully to identify foods with trans fats or high amounts of saturated fat.  Fill one half of your plate with vegetables and green salads. Eat 4-5 servings of vegetables per day. A serving of vegetables equals 1 cup of raw leafy vegetables,  cup of raw or cooked cut-up vegetables, or  cup of vegetable juice.  Fill one fourth of your plate with whole grains. Look for the word  "whole" as the first word in the ingredient list.  Fill one fourth of your plate with lean protein foods.  Eat 4-5 servings of fruit per day. A serving of fruit equals one medium whole fruit,  cup of dried fruit,  cup of fresh, frozen, or canned fruit, or  cup of 100% fruit juice.  Eat more foods that contain soluble fiber. Examples of foods that contain this type of fiber are apples, broccoli, carrots, beans, peas, and barley. Aim to get 20-30 g of fiber per day.  Eat more home-cooked food and less restaurant, buffet, and fast food.  Limit or avoid alcohol.  Limit foods that are high in starch and sugar.  Avoid fried foods.  Cook foods by using methods other than frying. Baking, boiling, grilling, and broiling are all great options. Other fat-reducing suggestions include: ? Removing the skin from poultry. ? Removing all visible fats from meats. ? Skimming the fat off of stews, soups, and gravies before serving them. ? Steaming vegetables in water or broth.  Lose weight if you are overweight. Losing just 5-10% of your initial body weight can help your overall health and prevent diseases such as diabetes and heart disease.  Increase your consumption of nuts, legumes, and seeds to 4-5 servings per week. One serving of dried beans or legumes equals  cup after being cooked, one serving of nuts equals 1 ounces, and one serving of seeds equals  ounce or 1 tablespoon.  You may need to monitor your salt (sodium) intake, especially if you have high blood pressure. Talk with your health care provider or dietitian to get  more information about reducing sodium. What foods can I eat? Grains  Breads, including [place], white, pita, wheat, raisin, rye, oatmeal, and [place]. Tortillas that are neither fried nor made with lard or trans fat. Low-fat rolls, including hotdog and hamburger buns and English muffins. Biscuits. Muffins. Waffles. Pancakes. Light popcorn. Whole-grain cereals. Flatbread.  Melba toast. Pretzels. Breadsticks. Rusks. Low-fat snacks and crackers, including oyster, saltine, matzo, graham, animal, and rye. Rice and pasta, including brown rice and those that are made with whole wheat. Vegetables All vegetables. Fruits All fruits, but limit coconut. Meats and Other Protein Sources Lean, well-trimmed beef, veal, pork, and lamb. Chicken and [place] without skin. All fish and shellfish. Wild duck, rabbit, pheasant, and venison. Egg whites or low-cholesterol egg substitutes. Dried beans, peas, lentils, and tofu.Seeds and most nuts. Dairy Low-fat or nonfat cheeses, including ricotta, string, and mozzarella. Skim or 1% milk that is liquid, powdered, or evaporated. Buttermilk that is made with low-fat milk. Nonfat or low-fat yogurt. Beverages Mineral water. Diet carbonated beverages. Sweets and Desserts Sherbets and fruit ices. Honey, jam, marmalade, jelly, and syrups. Meringues and gelatins. Pure sugar candy, such as hard candy, jelly beans, gumdrops, mints, marshmallows, and small amounts of dark chocolate. [employer] Eat all sweets and desserts in moderation. Fats and Oils Nonhydrogenated (trans-free) margarines. Vegetable oils, including soybean, sesame, sunflower, olive, peanut, safflower, corn, canola, and cottonseed. Salad dressings or mayonnaise that are made with a vegetable oil. Limit added fats and oils that you use for cooking, baking, salads, and as spreads. Other Cocoa powder. Coffee and tea. All seasonings and condiments. The items listed above may not be a complete list of recommended foods or beverages. Contact your dietitian for more options. What foods are not recommended? Grains Breads that are made with saturated or trans fats, oils, or whole milk. Croissants. Butter rolls. Cheese breads. Sweet rolls. Donuts. Buttered popcorn. Chow mein noodles. High-fat crackers, such as cheese or butter crackers. Meats and Other Protein Sources Fatty meats, such  as hotdogs, short ribs, sausage, spareribs, bacon, ribeye roast or steak, and mutton. High-fat deli meats, such as salami and bologna. Caviar. Domestic duck and goose. Organ meats, such as kidney, liver, sweetbreads, brains, gizzard, chitterlings, and heart. Dairy Cream, sour cream, cream cheese, and creamed cottage cheese. Whole milk cheeses, including blue (bleu), Monte[place], [place], [place], [place], [place], [place], [place], [place], and [place]. Whole or 2% milk that is liquid, evaporated, or condensed. Whole buttermilk. Cream sauce or high-fat cheese sauce. Yogurt that is made from whole milk. Beverages Regular sodas and drinks with added sugar. Sweets and Desserts Frosting. Pudding. Cookies. Cakes other than angel food cake. Candy that has milk chocolate or white chocolate, hydrogenated fat, butter, coconut, or unknown ingredients. Buttered syrups. Full-fat ice cream or ice cream drinks. Fats and Oils Gravy that has suet, meat fat, or shortening. Cocoa butter, hydrogenated oils, palm oil, coconut oil, palm kernel oil. These can often be found in baked products, candy, fried foods, nondairy creamers, and whipped toppings. Solid fats and shortenings, including bacon fat, salt pork, lard, and butter. Nondairy cream substitutes, such as coffee creamers and sour cream substitutes. Salad dressings that are made of unknown oils, cheese, or sour cream. The items listed above may not be a complete list of foods and beverages to avoid. Contact your dietitian for more information. This information is not intended to replace advice given to you by your health care provider. Make sure you discuss any questions you have with your health care  provider. Document Released: 12/06/2007 Document Revised: 09/16/2015 Document Reviewed: 08/20/2013 Elsevier Interactive Patient Education  2017 [place].  Fluoxetine capsules or tablets (Depression/Mood Disorders) What is this medicine? FLUOXETINE (floo OX e  teen) belongs to a class of drugs known as selective serotonin reuptake inhibitors (SSRIs). It helps to treat mood problems such as depression, obsessive compulsive disorder, and panic attacks. It can also treat certain eating disorders. This medicine may be used for other purposes; ask your health care provider or pharmacist if you have questions. COMMON BRAND NAME(S): Prozac What should I tell my health care provider before I take this medicine? They need to know if you have any of these conditions: -bipolar disorder or a family history of bipolar disorder -bleeding disorders -glaucoma -heart disease -liver disease -low levels of sodium in the blood -seizures -suicidal thoughts, plans, or attempt; a previous suicide attempt by you or a family member -take MAOIs like Carbex, Eldepryl, Marplan, Nardil, and Parnate -take medicines that treat or prevent blood clots -thyroid disease -an unusual or allergic reaction to fluoxetine, other medicines, foods, dyes, or preservatives -pregnant or trying to get pregnant -breast-feeding How should I use this medicine? Take this medicine by mouth with a glass of water. Follow the directions on the prescription label. You can take this medicine with or without food. Take your medicine at regular intervals. Do not take it more often than directed. Do not stop taking this medicine suddenly except upon the advice of your doctor. Stopping this medicine too quickly may cause serious side effects or your condition may worsen. A special MedGuide will be given to you by the pharmacist with each prescription and refill. Be sure to read this information carefully each time. Talk to your pediatrician regarding the use of this medicine in children. While this drug may be prescribed for children as young as 7 years for selected conditions, precautions do apply. Overdosage: If you think you have taken too much of this medicine contact a poison control center or emergency  room at once. NOTE: This medicine is only for you. Do not share this medicine with others. What if I miss a dose? If you miss a dose, skip the missed dose and go back to your regular dosing schedule. Do not take double or extra doses. What may interact with this medicine? Do not take this medicine with any of the following medications: -other medicines containing fluoxetine, like Sarafem or Symbyax -cisapride -linezolid -MAOIs like Carbex, Eldepryl, Marplan, Nardil, and Parnate -methylene blue (injected into a vein) -pimozide -thioridazine This medicine may also interact with the following medications: -alcohol -amphetamines -aspirin and aspirin-like medicines -carbamazepine -certain medicines for depression, anxiety, or psychotic disturbances -certain medicines for migraine headaches like almotriptan, eletriptan, frovatriptan, naratriptan, rizatriptan, sumatriptan, zolmitriptan -digoxin -diuretics -fentanyl -flecainide -furazolidone -isoniazid -lithium -medicines for sleep -medicines that treat or prevent blood clots like warfarin, enoxaparin, and dalteparin -NSAIDs, medicines for pain and inflammation, like ibuprofen or naproxen -phenytoin -procarbazine -propafenone -rasagiline -ritonavir -supplements like St. John's wort, kava kava, valerian -tramadol -tryptophan -vinblastine This list may not describe all possible interactions. Give your health care provider a list of all the medicines, herbs, non-prescription drugs, or dietary supplements you use. Also tell them if you smoke, drink alcohol, or use illegal drugs. Some items may interact with your medicine. What should I watch for while using this medicine? Tell your doctor if your symptoms do not get better or if they get worse. Visit your doctor or health care professional for regular  checks on your progress. Because it may take several weeks to see the full effects of this medicine, it is important to continue your  treatment as prescribed by your doctor. Patients and their families should watch out for new or worsening thoughts of suicide or depression. Also watch out for sudden changes in feelings such as feeling anxious, agitated, panicky, irritable, hostile, aggressive, impulsive, severely restless, overly excited and hyperactive, or not being able to sleep. If this happens, especially at the beginning of treatment or after a change in dose, call your health care professional. Dennis Bast may get drowsy or dizzy. Do not drive, use machinery, or do anything that needs mental alertness until you know how this medicine affects you. Do not stand or sit up quickly, especially if you are an older patient. This reduces the risk of dizzy or fainting spells. Alcohol may interfere with the effect of this medicine. Avoid alcoholic drinks. Your mouth may get dry. Chewing sugarless gum or sucking hard candy, and drinking plenty of water may help. Contact your doctor if the problem does not go away or is severe. This medicine may affect blood sugar levels. If you have diabetes, check with your doctor or health care professional before you change your diet or the dose of your diabetic medicine. What side effects may I notice from receiving this medicine? Side effects that you should report to your doctor or health care professional as soon as possible: -allergic reactions like skin rash, itching or hives, swelling of the face, lips, or tongue -anxious -black, tarry stools -breathing problems -changes in vision -confusion -elevated mood, decreased need for sleep, racing thoughts, impulsive behavior -eye pain -fast, irregular heartbeat -feeling faint or lightheaded, falls -feeling agitated, angry, or irritable -hallucination, loss of contact with reality -loss of balance or coordination -loss of memory -painful or prolonged erections -restlessness, pacing, inability to keep still -seizures -stiff muscles -suicidal thoughts  or other mood changes -trouble sleeping -unusual bleeding or bruising -unusually weak or tired -vomiting Side effects that usually do not require medical attention (report to your doctor or health care professional if they continue or are bothersome): -change in appetite or weight -change in sex drive or performance -diarrhea -dry mouth -headache -increased sweating -nausea -tremors This list may not describe all possible side effects. Call your doctor for medical advice about side effects. You may report side effects to FDA at 1-800-FDA-1088. Where should I keep my medicine? Keep out of the reach of children. Store at room temperature between 15 and 30 degrees C (59 and 86 degrees F). Throw away any unused medicine after the expiration date. NOTE: This sheet is a summary. It may not cover all possible information. If you have questions about this medicine, talk to your doctor, pharmacist, or health care provider.  2018 Elsevier/Gold Standard (2015-07-30 15:55:27)  Increase water intake, strive for at least 64 ounces/day.   Follow Heart Healthy diet Increase regular exercise.  Recommend at least 30 minutes daily, 5 days per week of walking, jogging, biking, swimming, YouTube/Pinterest workout videos. Please start once daily Prozac 20mg  and follow-up in 4 weeks. Referral to OB/GYN placed. NICE TO SEE YOU!

## 2017-02-25 NOTE — Assessment & Plan Note (Addendum)
Please start once daily Prozac 20mg  and follow-up in 4 weeks. Declined CBT referral today.

## 2017-02-25 NOTE — Assessment & Plan Note (Signed)
BP stable 121/75, HR 87 Denies acute cardiac sx's.

## 2017-02-25 NOTE — Assessment & Plan Note (Signed)
Referral to OB/GYN placed  

## 2017-03-14 DIAGNOSIS — E890 Postprocedural hypothyroidism: Secondary | ICD-10-CM | POA: Diagnosis not present

## 2017-04-01 ENCOUNTER — Ambulatory Visit: Payer: Medicare HMO | Admitting: Adult Health

## 2017-04-03 ENCOUNTER — Telehealth: Payer: Self-pay

## 2017-04-03 ENCOUNTER — Other Ambulatory Visit: Payer: Medicare HMO | Admitting: *Deleted

## 2017-04-03 DIAGNOSIS — I739 Peripheral vascular disease, unspecified: Secondary | ICD-10-CM

## 2017-04-03 DIAGNOSIS — E785 Hyperlipidemia, unspecified: Secondary | ICD-10-CM

## 2017-04-03 LAB — LIPID PANEL
Chol/HDL Ratio: 2.6 ratio (ref 0.0–4.4)
Cholesterol, Total: 158 mg/dL (ref 100–199)
HDL: 60 mg/dL (ref >39)
LDL Calculated: 86 mg/dL (ref 0–99)
Triglycerides: 58 mg/dL (ref 0–149)
VLDL CHOLESTEROL CAL: 12 mg/dL (ref 5–40)

## 2017-04-03 LAB — ALT: ALT: 26 IU/L (ref 0–32)

## 2017-04-03 NOTE — Telephone Encounter (Signed)
Spoke with patient regarding her lab work and Dr. Darnelle Bos suggestion to increase Rosuvastatin to 20 mg daily.    She is somewhat reluctant to this because she feels like it makes her "crazy like".    She has a visit with Dr. Saunders Revel on 1/28, which she will further discuss the medication with him.  We will also set her up for lab work to be done in the future.

## 2017-04-04 ENCOUNTER — Other Ambulatory Visit: Payer: Medicare HMO

## 2017-04-04 MED ORDER — ROSUVASTATIN CALCIUM 20 MG PO TABS: 20.0000 mg | ORAL_TABLET | Freq: Every day | ORAL | 3 refills | Status: DC

## 2017-04-04 NOTE — Telephone Encounter (Signed)
Spoke with patient to review medication change (Crestor), increase to 20mg  daily.  We will also set her up for labs (Lipid Profile & ALT) to be drawn in 3 months.  We will set this up on Monday during clinic visit.

## 2017-04-04 NOTE — Progress Notes (Signed)
Follow-up Outpatient Visit Date: 04/08/2017  Primary Care Provider: Esaw Grandchild, NP [place] [address]  Chief Complaint: Chest pain  HPI:  [name] is a 72 y.o. year-old female with history of  peripheral vascular disease with right vertebral artery occlusion and left subclavian artery stenosis status post stenting (12/2016), stroke in 2016, hyperlipidemia, hypothyroidism, and GERD, who presents for follow-up of peripheral vascular disease and chest pain. I last saw her in late October after she underwent stenting of the left subclavian artery by Dr. Trula Slade. She noted resolution of previously noted "heaviness" in her left arm. She continued to have occasional atypical chest pain and palpitations, though given negative stress test and 30-day event monitor, we agreed to defer additional workup/treatment.  Today, [name] reports that she continues to have substernal "burning" when she walks. The discomfort comes on within 1-2 minutes of walking and typically resolves after 2-3 minutes (even if she continues to exert herself). She notes mild associated shortness of breath. Severity seems to be getting gradually worse; the pain was particularly noticeable during a recent trip to [place]. Her left arm feels well without heaviness. Groin bruising following PV intervention has resolved. Sporadic palpitations without accompanying symptoms are unchanged. She denies orthopnea and edema. [name] increased rosuvastatin to 10 mg daily after our last visit. She has felt less energetic for the last few months. She wonders if this could be related to rosuvastatin or stress. She as started on fluoxetine by her PCP about a month ago with some improvement in her energy and mood.  --------------------------------------------------------------------------------------------------  Cardiovascular History & Procedures: Cardiovascular Problems:  Peripheral vascular  disease  Stroke  Risk Factors:  Peripheral and cerebrovascular disease, hyperlipidemia, and age greater than 2  Cath/PCI:  None  CV Surgery:  Aortography and left subclavian angiography; intervention (01/01/17, Dr. Trula Slade): Occluded right vertebral artery. 80% left subclavian artery stenosis with successful intervention with Xpress 8 x 25 mm balloon-expandable stent.  EP Procedures and Devices:  30-day event monitor (11/28/16): Sinus rhythm. No significant arrhythmia.  Non-Invasive Evaluation(s):  Exercise MPI (11/22/16): Low risk study with small in size, mild in severity, fixed apical anterior and apical defect likely representing apical thinning. LVEF >65% with normal wall motion. Patient complained of fatigue during test, exercising 5:30 minutes.  Upper extremity arterial Doppler (10/26/16): Right upper extremity evaluation normal. Left upper extremity demonstrates dampened biphasic to monophasic waveforms and 20 mmHg pressure difference compared to the right.  Carotid artery duplex (10/18/16): Bilateral 1-39% stenoses involving the right and left internal carotid arteries.  Transthoracic echocardiogram (12/28/14): Normal LV size and function. LVEF 60-65% with grade 1 diastolic dysfunction. Normal RV size and function. No significant vascular abnormalities.  Recent CV Pertinent Labs: Lab Results  Component Value Date   CHOL 158 04/03/2017   HDL 60 04/03/2017   LDLCALC 86 04/03/2017   TRIG 58 04/03/2017   CHOLHDL 2.6 04/03/2017   CHOLHDL 3.9 12/27/2014   INR 0.95 12/26/2014   K 4.1 01/01/2017   BUN 13 01/01/2017   BUN 17 09/13/2016   CREATININE 0.70 01/01/2017    Past medical and surgical history were reviewed and updated in EPIC.  Current Meds  Medication Sig  . acetaminophen (TYLENOL) 500 MG tablet Take 1,000 mg by mouth daily as needed for moderate pain or headache.  Marland Kitchen aspirin EC 81 MG tablet Take 81 mg by mouth daily.  . Calcium Carb-Cholecalciferol  (CALCIUM + D3) 600-200 MG-UNIT TABS Take 1  tablet by mouth 4 (four) times a week.   . clopidogrel (PLAVIX) 75 MG tablet Take 1 tablet (75 mg total) by mouth daily.  . diazepam (VALIUM) 5 MG tablet Take 5 mg by mouth at bedtime as needed (sleep).   . diphenhydrAMINE (BENADRYL) 25 MG tablet Take 25 mg by mouth at bedtime as needed for sleep.  . dorzolamide (TRUSOPT) 2 % ophthalmic solution Place 1 drop into both eyes daily.   Marland Kitchen FLUoxetine (PROZAC) 20 MG capsule Take 1 capsule (20 mg total) by mouth daily.  Marland Kitchen latanoprost (XALATAN) 0.005 % ophthalmic solution Place 1 drop into both eyes at bedtime.  Marland Kitchen levothyroxine (SYNTHROID, LEVOTHROID) 100 MCG tablet Take 100 mcg by mouth daily.  . Multiple Vitamin (MULTIVITAMIN) tablet Take 1 tablet by mouth daily.  . pantoprazole (PROTONIX) 20 MG tablet Take 20 mg by mouth as needed.  . rosuvastatin (CRESTOR) 20 MG tablet Take 1 tablet (20 mg total) by mouth daily.  . timolol (BETIMOL) 0.5 % ophthalmic solution Place 1 drop into both eyes daily.    Allergies: Neuromuscular blocking agents and Vicodin [hydrocodone-acetaminophen]  Social History   Socioeconomic History  . Marital status: Married    Spouse name: Not on file  . Number of children: 1  . Years of education: Not on file  . Highest education level: Not on file  Social Needs  . Financial resource strain: Not on file  . Food insecurity - worry: Not on file  . Food insecurity - inability: Not on file  . Transportation needs - medical: Not on file  . Transportation needs - non-medical: Not on file  Occupational History  . 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 Smoking status: Former Smoker    Packs/day: 0.25    Types: Cigarettes    Last attempt to quit: 03/12/1978    Years since quitting: 39.1  . Smokeless tobacco: Never Used  . Tobacco comment: quit in 1978  Substance and Sexual Activity  . Alcohol use: Yes    Alcohol/week: 4.2 oz     Types: 7 Glasses of wine per week  . Drug use: No  . Sexual activity: Not Currently    Birth control/protection: None  Other Topics Concern  . Not on file  Social History Narrative  . Not on file    Family History  Problem Relation Age of Onset  . Other Father 57       SEPSIS  . Hypertension Father   . Stroke Father 35       CVA  . Heart failure Mother 90  . Heart murmur Mother   . Heart attack Sister 53  . Diabetes Brother   . Breast cancer Sister   . Stroke Sister   . Cirrhosis Brother   . Hypertension Sister        x 2  . Diabetes Sister   . Heart murmur Daughter     Review of Systems: A 12-system review of systems was performed and was negative except as noted in the HPI.  --------------------------------------------------------------------------------------------------  Physical Exam: BP 118/60   Pulse 80   Ht 5\' 2"  (1.575 m)   Wt 129 lb 6.4 oz (58.7 kg)   SpO2 98%   BMI 23.67 kg/m   General:  Well-developed well-nourished woman, seated comfortably in the exam room. HEENT: No conjunctival pallor or scleral icterus. Moist mucous membranes.  OP clear. Neck: Supple without lymphadenopathy, thyromegaly, JVD,  or HJR. Lungs: Normal work of breathing. Clear to auscultation bilaterally without wheezes or crackles. Heart: Regular rate and rhythm without murmurs, rubs, or gallops. Non-displaced PMI. Abd: Bowel sounds present. Soft, NT/ND without hepatosplenomegaly Ext: No lower extremity edema. Radial, PT, and DP pulses are 2+ bilaterally. Skin: Warm and dry without rash.  Lab Results  Component Value Date   WBC 4.2 09/13/2016   HGB 15.3 (H) 01/01/2017   HCT 45.0 01/01/2017   MCV 90 09/13/2016   PLT 269 09/13/2016    Lab Results  Component Value Date   NA 138 01/01/2017   K 4.1 01/01/2017   CL 101 01/01/2017   CO2 23 09/13/2016   BUN 13 01/01/2017   CREATININE 0.70 01/01/2017   GLUCOSE 96 01/01/2017   ALT 26 04/03/2017    Lab Results  Component  Value Date   CHOL 158 04/03/2017   HDL 60 04/03/2017   LDLCALC 86 04/03/2017   TRIG 58 04/03/2017   CHOLHDL 2.6 04/03/2017    --------------------------------------------------------------------------------------------------  ASSESSMENT AND PLAN: Accelerating angina Ms. Texidor continues to have atypical chest pain. She describes a burning that often comes on with activity but then ceases after 2-3 minutes, even if she continues to exert herself. Myocardial perfusion stress test last fall was low risk. However, in light of her continued (and slightly progressive) symptoms, we have agreed to proceed with left heart catheterization. I have reviewed the risks, indications, and alternatives to cardiac catheterization, possible angioplasty, and stenting with the patient. Risks include but are not limited to bleeding, infection, vascular injury, stroke, myocardial infection, arrhythmia, kidney injury, radiation-related injury in the case of prolonged fluoroscopy use, emergency cardiac surgery, and death. The patient understands the risks of serious complication is 1-2 in 1093 with diagnostic cardiac cath and 1-2% or less with angioplasty/stenting. In the meantime, we will start isosorbide mononitrate 15 mg daily.  Hyperlipidemia Most recent lipid panel earlier this month showed LDL of 86 (goal < 70). I have recommended that we increase rosuvastatin to 20 mg daily and repeat a lipid panel/ALT in ~3 months.  Peripheral vascular disease No new symptoms. Continue ASA/clopidogrel and follow-up with Dr. Trula Slade following left subclavian artery stent. Continue secondary prevention, including statin therapy.  Follow-up: TBD based on results of catheterization.  Nelva Bush, MD 04/08/2017 7:42 PM

## 2017-04-04 NOTE — H&P (View-Only) (Signed)
Follow-up Outpatient Visit Date: 04/08/2017  Primary Care Provider: Esaw Grandchild, NP [place] [address]  Chief Complaint: Chest pain  HPI:  Ms. Ose is a 72 y.o. year-old female with history of  peripheral vascular disease with right vertebral artery occlusion and left subclavian artery stenosis status post stenting (12/2016), stroke in 2016, hyperlipidemia, hypothyroidism, and GERD, who presents for follow-up of peripheral vascular disease and chest pain. I last saw her in late October after she underwent stenting of the left subclavian artery by Dr. Trula Slade. She noted resolution of previously noted "heaviness" in her left arm. She continued to have occasional atypical chest pain and palpitations, though given negative stress test and 30-day event monitor, we agreed to defer additional workup/treatment.  Today, Ms. Wands reports that she continues to have substernal "burning" when she walks. The discomfort comes on within 1-2 minutes of walking and typically resolves after 2-3 minutes (even if she continues to exert herself). She notes mild associated shortness of breath. Severity seems to be getting gradually worse; the pain was particularly noticeable during a recent trip to [place]. Her left arm feels well without heaviness. Groin bruising following PV intervention has resolved. Sporadic palpitations without accompanying symptoms are unchanged. She denies orthopnea and edema. Ms. Frechette increased rosuvastatin to 10 mg daily after our last visit. She has felt less energetic for the last few months. She wonders if this could be related to rosuvastatin or stress. She as started on fluoxetine by her PCP about a month ago with some improvement in her energy and mood.  --------------------------------------------------------------------------------------------------  Cardiovascular History & Procedures: Cardiovascular Problems:  Peripheral vascular  disease  Stroke  Risk Factors:  Peripheral and cerebrovascular disease, hyperlipidemia, and age greater than 32  Cath/PCI:  None  CV Surgery:  Aortography and left subclavian angiography; intervention (01/01/17, Dr. Trula Slade): Occluded right vertebral artery. 80% left subclavian artery stenosis with successful intervention with Xpress 8 x 25 mm balloon-expandable stent.  EP Procedures and Devices:  30-day event monitor (11/28/16): Sinus rhythm. No significant arrhythmia.  Non-Invasive Evaluation(s):  Exercise MPI (11/22/16): Low risk study with small in size, mild in severity, fixed apical anterior and apical defect likely representing apical thinning. LVEF >65% with normal wall motion. Patient complained of fatigue during test, exercising 5:30 minutes.  Upper extremity arterial Doppler (10/26/16): Right upper extremity evaluation normal. Left upper extremity demonstrates dampened biphasic to monophasic waveforms and 20 mmHg pressure difference compared to the right.  Carotid artery duplex (10/18/16): Bilateral 1-39% stenoses involving the right and left internal carotid arteries.  Transthoracic echocardiogram (12/28/14): Normal LV size and function. LVEF 60-65% with grade 1 diastolic dysfunction. Normal RV size and function. No significant vascular abnormalities.  Recent CV Pertinent Labs: Lab Results  Component Value Date   CHOL 158 04/03/2017   HDL 60 04/03/2017   LDLCALC 86 04/03/2017   TRIG 58 04/03/2017   CHOLHDL 2.6 04/03/2017   CHOLHDL 3.9 12/27/2014   INR 0.95 12/26/2014   K 4.1 01/01/2017   BUN 13 01/01/2017   BUN 17 09/13/2016   CREATININE 0.70 01/01/2017    Past medical and surgical history were reviewed and updated in EPIC.  Current Meds  Medication Sig  . acetaminophen (TYLENOL) 500 MG tablet Take 1,000 mg by mouth daily as needed for moderate pain or headache.  Marland Kitchen aspirin EC 81 MG tablet Take 81 mg by mouth daily.  . Calcium Carb-Cholecalciferol  (CALCIUM + D3) 600-200 MG-UNIT TABS Take 1  tablet by mouth 4 (four) times a week.   . clopidogrel (PLAVIX) 75 MG tablet Take 1 tablet (75 mg total) by mouth daily.  . diazepam (VALIUM) 5 MG tablet Take 5 mg by mouth at bedtime as needed (sleep).   . diphenhydrAMINE (BENADRYL) 25 MG tablet Take 25 mg by mouth at bedtime as needed for sleep.  . dorzolamide (TRUSOPT) 2 % ophthalmic solution Place 1 drop into both eyes daily.   Marland Kitchen FLUoxetine (PROZAC) 20 MG capsule Take 1 capsule (20 mg total) by mouth daily.  Marland Kitchen latanoprost (XALATAN) 0.005 % ophthalmic solution Place 1 drop into both eyes at bedtime.  Marland Kitchen levothyroxine (SYNTHROID, LEVOTHROID) 100 MCG tablet Take 100 mcg by mouth daily.  . Multiple Vitamin (MULTIVITAMIN) tablet Take 1 tablet by mouth daily.  . pantoprazole (PROTONIX) 20 MG tablet Take 20 mg by mouth as needed.  . rosuvastatin (CRESTOR) 20 MG tablet Take 1 tablet (20 mg total) by mouth daily.  . timolol (BETIMOL) 0.5 % ophthalmic solution Place 1 drop into both eyes daily.    Allergies: Neuromuscular blocking agents and Vicodin [hydrocodone-acetaminophen]  Social History   Socioeconomic History  . Marital status: Married    Spouse name: Not on file  . Number of children: 1  . Years of education: Not on file  . Highest education level: Not on file  Social Needs  . Financial resource strain: Not on file  . Food insecurity - worry: Not on file  . Food insecurity - inability: Not on file  . Transportation needs - medical: Not on file  . Transportation needs - non-medical: Not on file  Occupational History  . 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 Smoking status: Former Smoker    Packs/day: 0.25    Types: Cigarettes    Last attempt to quit: 03/12/1978    Years since quitting: 39.1  . Smokeless tobacco: Never Used  . Tobacco comment: quit in 1978  Substance and Sexual Activity  . Alcohol use: Yes    Alcohol/week: 4.2 oz     Types: 7 Glasses of wine per week  . Drug use: No  . Sexual activity: Not Currently    Birth control/protection: None  Other Topics Concern  . Not on file  Social History Narrative  . Not on file    Family History  Problem Relation Age of Onset  . Other Father 62       SEPSIS  . Hypertension Father   . Stroke Father 16       CVA  . Heart failure Mother 45  . Heart murmur Mother   . Heart attack Sister 45  . Diabetes Brother   . Breast cancer Sister   . Stroke Sister   . Cirrhosis Brother   . Hypertension Sister        x 2  . Diabetes Sister   . Heart murmur Daughter     Review of Systems: A 12-system review of systems was performed and was negative except as noted in the HPI.  --------------------------------------------------------------------------------------------------  Physical Exam: BP 118/60   Pulse 80   Ht 5\' 2"  (1.575 m)   Wt 129 lb 6.4 oz (58.7 kg)   SpO2 98%   BMI 23.67 kg/m   General:  Well-developed well-nourished woman, seated comfortably in the exam room. HEENT: No conjunctival pallor or scleral icterus. Moist mucous membranes.  OP clear. Neck: Supple without lymphadenopathy, thyromegaly, JVD,  or HJR. Lungs: Normal work of breathing. Clear to auscultation bilaterally without wheezes or crackles. Heart: Regular rate and rhythm without murmurs, rubs, or gallops. Non-displaced PMI. Abd: Bowel sounds present. Soft, NT/ND without hepatosplenomegaly Ext: No lower extremity edema. Radial, PT, and DP pulses are 2+ bilaterally. Skin: Warm and dry without rash.  Lab Results  Component Value Date   WBC 4.2 09/13/2016   HGB 15.3 (H) 01/01/2017   HCT 45.0 01/01/2017   MCV 90 09/13/2016   PLT 269 09/13/2016    Lab Results  Component Value Date   NA 138 01/01/2017   K 4.1 01/01/2017   CL 101 01/01/2017   CO2 23 09/13/2016   BUN 13 01/01/2017   CREATININE 0.70 01/01/2017   GLUCOSE 96 01/01/2017   ALT 26 04/03/2017    Lab Results  Component  Value Date   CHOL 158 04/03/2017   HDL 60 04/03/2017   LDLCALC 86 04/03/2017   TRIG 58 04/03/2017   CHOLHDL 2.6 04/03/2017    --------------------------------------------------------------------------------------------------  ASSESSMENT AND PLAN: Accelerating angina Ms. Vanderploeg continues to have atypical chest pain. She describes a burning that often comes on with activity but then ceases after 2-3 minutes, even if she continues to exert herself. Myocardial perfusion stress test last fall was low risk. However, in light of her continued (and slightly progressive) symptoms, we have agreed to proceed with left heart catheterization. I have reviewed the risks, indications, and alternatives to cardiac catheterization, possible angioplasty, and stenting with the patient. Risks include but are not limited to bleeding, infection, vascular injury, stroke, myocardial infection, arrhythmia, kidney injury, radiation-related injury in the case of prolonged fluoroscopy use, emergency cardiac surgery, and death. The patient understands the risks of serious complication is 1-2 in 5852 with diagnostic cardiac cath and 1-2% or less with angioplasty/stenting. In the meantime, we will start isosorbide mononitrate 15 mg daily.  Hyperlipidemia Most recent lipid panel earlier this month showed LDL of 86 (goal < 70). I have recommended that we increase rosuvastatin to 20 mg daily and repeat a lipid panel/ALT in ~3 months.  Peripheral vascular disease No new symptoms. Continue ASA/clopidogrel and follow-up with Dr. Trula Slade following left subclavian artery stent. Continue secondary prevention, including statin therapy.  Follow-up: TBD based on results of catheterization.  Nelva Bush, MD 04/08/2017 7:42 PM

## 2017-04-06 ENCOUNTER — Other Ambulatory Visit: Payer: Self-pay | Admitting: Adult Health

## 2017-04-08 ENCOUNTER — Telehealth: Payer: Self-pay | Admitting: Internal Medicine

## 2017-04-08 ENCOUNTER — Ambulatory Visit: Payer: Medicare HMO | Admitting: Internal Medicine

## 2017-04-08 ENCOUNTER — Encounter: Payer: Self-pay | Admitting: Internal Medicine

## 2017-04-08 VITALS — BP 118/60 | HR 80 | Ht 62.0 in | Wt 129.4 lb

## 2017-04-08 DIAGNOSIS — I200 Unstable angina: Secondary | ICD-10-CM

## 2017-04-08 DIAGNOSIS — E785 Hyperlipidemia, unspecified: Secondary | ICD-10-CM | POA: Diagnosis not present

## 2017-04-08 DIAGNOSIS — I739 Peripheral vascular disease, unspecified: Secondary | ICD-10-CM

## 2017-04-08 DIAGNOSIS — R079 Chest pain, unspecified: Secondary | ICD-10-CM

## 2017-04-08 DIAGNOSIS — Z01812 Encounter for preprocedural laboratory examination: Secondary | ICD-10-CM

## 2017-04-08 MED ORDER — ISOSORBIDE MONONITRATE ER 30 MG PO TB24: 30.0000 mg | ORAL_TABLET | Freq: Every day | ORAL | 3 refills | Status: DC

## 2017-04-08 NOTE — Patient Instructions (Addendum)
Medication Instructions:   Start Imdur 15mg  daily; Take half of tablet by mouth daily  -- If you need a refill on your cardiac medications before your next appointment, please call your pharmacy. --  Labwork:  To be determined  Testing/Procedures: None ordered  Follow-Up: To be determined after catheterization  Thank you for choosing [facility]!!    Any Other Special Instructions Will Be Listed Below (If Applicable).

## 2017-04-08 NOTE — Telephone Encounter (Signed)
Patient calling, would like to schedule cath  for 04-19-17.

## 2017-04-08 NOTE — Telephone Encounter (Signed)
Spoke with patient and scheduled a heart cath to be done on 2/8. Her lab work will be done on 2/1.  A letter will be given to patient when she arrives for labs.

## 2017-04-09 ENCOUNTER — Telehealth: Payer: Self-pay

## 2017-04-09 DIAGNOSIS — E785 Hyperlipidemia, unspecified: Secondary | ICD-10-CM

## 2017-04-09 NOTE — Telephone Encounter (Signed)
Patient confirmed to be here at [place] for labs and EKG.

## 2017-04-09 NOTE — Progress Notes (Deleted)
Subjective:    Patient ID: [name], female    DOB: 19-Jul-1945, 72 y.o.   MRN: [id number]  HPI: 02/25/17 OV:  [name] presents for CPE.   10/09/16 seen by neurology/Dr. Leonie Man- recommended to stop Clopidogrel 75mg  and start ASA 325mg  daily once she completed current supply of Rx 11/16/16 seen by cards/Dr. End and it was recommended that she remain on Clopidogrel 75mg  daily instead of changing to ASA 325mg  daily. She is currently on Clopidogrel 75mg  and tolerating well She underwent successful L subclavian stent 01/01/17 and was seen by vascular surgeon 02/11/17. She was last seen by cards 01/07/17, her LDL goal >70 and they increased her rosuvastatin to 10mg /daily- she is tolerating well. Cards will re-check her lipids 03/2017 She is experiencing emotional liability- specifically irritability and "feeling more tearful" than normal- worsening over the last few months. She denies thoughts of harming herself/others and she has been on Fluoxetine in the past and tolerated well. She also reports low labido, vaginal dryness, and "full/heavy breasts"- all worsening over the last year. She estimates to drink 40-50 oz water/day, follows heart healthy diet, and practices yoga 3-4 times/week. She also reports insomnia and excessive worry when she wakes early am.  Healthcare Maintenance: PAP-not indicated, hx of hysterectomy Mammogram-next due 09/2018 Colonoscopy- last completed 6/18- benign polyps removed, next due 2028  04/15/17 OV: [name] is here for f/u: anxiety/depression and she was started on Fluoxetine 20mg  DQ   Review of Systems  Constitutional: Positive for fatigue. Negative for activity change, appetite change, chills, diaphoresis, fever and unexpected weight change.  HENT: Negative for congestion.   Eyes: Negative for visual disturbance.  Respiratory: Negative for cough, chest tightness, shortness of breath, wheezing and stridor.   Cardiovascular: Negative for chest pain,  palpitations and leg swelling.  Gastrointestinal: Negative for abdominal distention, blood in stool, constipation, diarrhea, nausea and vomiting.  Endocrine: Negative for cold intolerance, heat intolerance, polydipsia, polyphagia and polyuria.  Genitourinary: Negative for difficulty urinating, flank pain and hematuria.  Musculoskeletal: Negative for arthralgias, back pain, gait problem, joint swelling, myalgias, neck pain and neck stiffness.  Neurological: Negative for dizziness, weakness and headaches.  Hematological: Does not bruise/bleed easily.  Psychiatric/Behavioral: Positive for sleep disturbance. Negative for self-injury. The patient is nervous/anxious. The patient is not hyperactive.        Objective:   Physical Exam  Constitutional: She is oriented to person, place, and time. She appears well-developed and well-nourished. No distress.  HENT:  Head: Normocephalic and atraumatic.  Right Ear: Hearing, tympanic membrane, external ear and ear canal normal. Tympanic membrane is not erythematous and not bulging. No decreased hearing is noted.  Left Ear: Hearing, tympanic membrane, external ear and ear canal normal. Tympanic membrane is not erythematous and not bulging. No decreased hearing is noted.  Nose: Nose normal. No mucosal edema. Right sinus exhibits no maxillary sinus tenderness and no frontal sinus tenderness. Left sinus exhibits no maxillary sinus tenderness and no frontal sinus tenderness.  Mouth/Throat: Uvula is midline, oropharynx is clear and moist and mucous membranes are normal.  Eyes: Conjunctivae are normal. Pupils are equal, round, and reactive to light.  Neck: Normal range of motion. Neck supple.  Cardiovascular: Normal rate, regular rhythm, normal heart sounds and intact distal pulses.  No murmur heard. Pulmonary/Chest: Effort normal and breath sounds normal. No respiratory distress. She has no decreased breath sounds. She has no wheezes. She has no rhonchi. She has no  rales. She exhibits no tenderness. Right breast  exhibits no inverted nipple, no mass, no nipple discharge and no skin change. Left breast exhibits no inverted nipple, no mass, no nipple discharge and no skin change.    Fibrous breast tissue- L breat 9 o clock position  Abdominal: Soft. Bowel sounds are normal. She exhibits no distension and no mass. There is no tenderness. There is no rebound and no guarding.  Musculoskeletal: Normal range of motion. She exhibits no edema or tenderness.  Lymphadenopathy:    She has no cervical adenopathy.  Neurological: She is alert and oriented to person, place, and time. Coordination normal.  Skin: Skin is warm and dry. No rash noted. She is not diaphoretic. No erythema. No pallor.  Psychiatric: She has a normal mood and affect. Her behavior is normal. Judgment and thought content normal.  Nursing note and vitals reviewed.         Assessment & Plan:   No diagnosis found.  No problem-specific Assessment & Plan notes found for this encounter.    FOLLOW-UP:  No Follow-up on file.

## 2017-04-12 ENCOUNTER — Other Ambulatory Visit: Payer: Medicare HMO | Admitting: *Deleted

## 2017-04-12 ENCOUNTER — Ambulatory Visit (INDEPENDENT_AMBULATORY_CARE_PROVIDER_SITE_OTHER): Payer: Medicare HMO | Admitting: Nurse Practitioner

## 2017-04-12 VITALS — BP 106/72 | HR 70 | Ht 62.0 in | Wt 121.2 lb

## 2017-04-12 DIAGNOSIS — I200 Unstable angina: Secondary | ICD-10-CM | POA: Diagnosis not present

## 2017-04-12 DIAGNOSIS — R079 Chest pain, unspecified: Secondary | ICD-10-CM

## 2017-04-12 DIAGNOSIS — Z01812 Encounter for preprocedural laboratory examination: Secondary | ICD-10-CM | POA: Diagnosis not present

## 2017-04-12 NOTE — Progress Notes (Signed)
1.) Reason for visit: EKG  2.) Name of MD requesting visit: Dr. Saunders Revel  3.) H&P: Patient presents for pre-cath EKG and lab work. She denies complaints or concerns.   4.) ROS related to problem: Patient presents for pre-cath EKG. Patient last seen in the office on 1/28 and is taking Imdur 15 mg daily as prescribed at the visit. She denies any concerns or complaints. She is ambulatory in NAD and alert to time, place, and person.   5.) Assessment and plan per MD: EKG reviewed by Dr. Saunders Revel who advised patient may continue current treatment plan

## 2017-04-12 NOTE — Patient Instructions (Signed)
Medication Instructions:  Your physician recommends that you continue on your current medications as directed. Please refer to the Current Medication list given to you today.   Labwork: Done Today   Testing/Procedures: Keep your appointment for Cardiac cath on 2/8   Follow-Up: Your physician recommends that you schedule a follow-up appointment in: per Dr. Saunders Revel after cardiac cath   If you need a refill on your cardiac medications before your next appointment, please call your pharmacy.   Thank you for choosing [facility]! Christen Bame, RN [phone]

## 2017-04-13 LAB — PROTIME-INR
INR: 1 (ref 0.8–1.2)
Prothrombin Time: 10.4 s (ref 9.1–12.0)

## 2017-04-13 LAB — CBC WITH DIFFERENTIAL/PLATELET
BASOS ABS: 0 10*3/uL (ref 0.0–0.2)
BASOS: 0 %
EOS (ABSOLUTE): 0.2 10*3/uL (ref 0.0–0.4)
Eos: 4 %
HEMOGLOBIN: 14.9 g/dL (ref 11.1–15.9)
Hematocrit: 44.9 % (ref 34.0–46.6)
IMMATURE GRANS (ABS): 0 10*3/uL (ref 0.0–0.1)
Immature Granulocytes: 0 %
LYMPHS: 34 %
Lymphocytes Absolute: 1.9 10*3/uL (ref 0.7–3.1)
MCH: 30.3 pg (ref 26.6–33.0)
MCHC: 33.2 g/dL (ref 31.5–35.7)
MCV: 91 fL (ref 79–97)
Monocytes Absolute: 0.6 10*3/uL (ref 0.1–0.9)
Monocytes: 11 %
NEUTROS ABS: 2.7 10*3/uL (ref 1.4–7.0)
NEUTROS PCT: 51 %
Platelets: 285 10*3/uL (ref 150–379)
RBC: 4.91 x10E6/uL (ref 3.77–5.28)
RDW: 13.4 % (ref 12.3–15.4)
WBC: 5.5 10*3/uL (ref 3.4–10.8)

## 2017-04-13 LAB — BASIC METABOLIC PANEL
BUN / CREAT RATIO: 21 (ref 12–28)
BUN: 15 mg/dL (ref 8–27)
CO2: 21 mmol/L (ref 20–29)
CREATININE: 0.71 mg/dL (ref 0.57–1.00)
Calcium: 10 mg/dL (ref 8.7–10.3)
Chloride: 96 mmol/L (ref 96–106)
GFR calc non Af Amer: 85 mL/min/{1.73_m2} (ref >59)
GFR, EST AFRICAN AMERICAN: 98 mL/min/{1.73_m2} (ref >59)
GLUCOSE: 122 mg/dL — AB (ref 65–99)
Potassium: 5.3 mmol/L — ABNORMAL HIGH (ref 3.5–5.2)
SODIUM: 134 mmol/L (ref 134–144)

## 2017-04-15 ENCOUNTER — Ambulatory Visit: Payer: Medicare HMO | Admitting: Adult Health

## 2017-04-15 ENCOUNTER — Telehealth: Payer: Self-pay

## 2017-04-15 NOTE — Telephone Encounter (Signed)
Informed patient of lab results

## 2017-04-16 ENCOUNTER — Telehealth: Payer: Self-pay | Admitting: *Deleted

## 2017-04-16 DIAGNOSIS — H401122 Primary open-angle glaucoma, left eye, moderate stage: Secondary | ICD-10-CM | POA: Diagnosis not present

## 2017-04-16 NOTE — Telephone Encounter (Signed)
Cardiac Cath scheduled for: Friday April 19, 2017 7:30 AM [facility] Main Entrance A/[place] at: 5:30 AM  AM meds can be  taken pre-cath with sip of water including: ASA 81 mg am of cath Plavix 75 mg am of cath   Nothing to eat or drink after midnight. Confirmed patient has responsible person to drive home post procedure and observe patient for 24 hours:

## 2017-04-16 NOTE — Telephone Encounter (Signed)
Allergies-confirmed allergies listed in Epic with patient. Diabetes -no -confirmed with patient.  I spoke with patient, confirmed and discussed heart cath instructions, she verbalized understanding, thanked me for call.

## 2017-04-19 ENCOUNTER — Ambulatory Visit (HOSPITAL_COMMUNITY)
Admission: RE | Admit: 2017-04-19 | Discharge: 2017-04-19 | Disposition: A | Discharge status: Home / Self Care | Payer: Medicare HMO | Source: Ambulatory Visit | Attending: Internal Medicine | Admitting: Internal Medicine

## 2017-04-19 ENCOUNTER — Encounter (HOSPITAL_COMMUNITY)
Admission: RE | Disposition: A | Discharge status: Home / Self Care | Payer: Self-pay | Source: Ambulatory Visit | Attending: Internal Medicine

## 2017-04-19 DIAGNOSIS — Z8673 Personal history of transient ischemic attack (TIA), and cerebral infarction without residual deficits: Secondary | ICD-10-CM | POA: Insufficient documentation

## 2017-04-19 DIAGNOSIS — E039 Hypothyroidism, unspecified: Secondary | ICD-10-CM | POA: Insufficient documentation

## 2017-04-19 DIAGNOSIS — I6501 Occlusion and stenosis of right vertebral artery: Secondary | ICD-10-CM | POA: Diagnosis not present

## 2017-04-19 DIAGNOSIS — Z87891 Personal history of nicotine dependence: Secondary | ICD-10-CM | POA: Diagnosis not present

## 2017-04-19 DIAGNOSIS — Z7902 Long term (current) use of antithrombotics/antiplatelets: Secondary | ICD-10-CM | POA: Diagnosis not present

## 2017-04-19 DIAGNOSIS — K219 Gastro-esophageal reflux disease without esophagitis: Secondary | ICD-10-CM | POA: Insufficient documentation

## 2017-04-19 DIAGNOSIS — I200 Unstable angina: Secondary | ICD-10-CM | POA: Diagnosis present

## 2017-04-19 DIAGNOSIS — I25118 Atherosclerotic heart disease of native coronary artery with other forms of angina pectoris: Secondary | ICD-10-CM | POA: Diagnosis present

## 2017-04-19 DIAGNOSIS — Z8249 Family history of ischemic heart disease and other diseases of the circulatory system: Secondary | ICD-10-CM | POA: Insufficient documentation

## 2017-04-19 DIAGNOSIS — I25110 Atherosclerotic heart disease of native coronary artery with unstable angina pectoris: Secondary | ICD-10-CM | POA: Diagnosis not present

## 2017-04-19 DIAGNOSIS — I739 Peripheral vascular disease, unspecified: Secondary | ICD-10-CM | POA: Insufficient documentation

## 2017-04-19 DIAGNOSIS — Z7982 Long term (current) use of aspirin: Secondary | ICD-10-CM | POA: Insufficient documentation

## 2017-04-19 DIAGNOSIS — E785 Hyperlipidemia, unspecified: Secondary | ICD-10-CM | POA: Diagnosis not present

## 2017-04-19 HISTORY — PX: LEFT HEART CATH AND CORONARY ANGIOGRAPHY: CATH118249

## 2017-04-19 HISTORY — PX: INTRAVASCULAR PRESSURE WIRE/FFR STUDY: CATH118243

## 2017-04-19 LAB — POCT ACTIVATED CLOTTING TIME
ACTIVATED CLOTTING TIME: 252 s
ACTIVATED CLOTTING TIME: 263 s

## 2017-04-19 SURGERY — LEFT HEART CATH AND CORONARY ANGIOGRAPHY
Anesthesia: LOCAL

## 2017-04-19 MED ORDER — LIDOCAINE HCL (PF) 1 % IJ SOLN
INTRAMUSCULAR | Status: AC
  Filled 2017-04-19: qty 30

## 2017-04-19 MED ORDER — HEPARIN SODIUM (PORCINE) 1000 UNIT/ML IJ SOLN
INTRAMUSCULAR | Status: AC
  Filled 2017-04-19: qty 1

## 2017-04-19 MED ORDER — VERAPAMIL HCL 2.5 MG/ML IV SOLN
INTRAVENOUS | Status: AC
  Filled 2017-04-19: qty 2

## 2017-04-19 MED ORDER — SODIUM CHLORIDE 0.9 % IV SOLN: 250.0000 mL | INTRAVENOUS | Status: DC | PRN

## 2017-04-19 MED ORDER — MIDAZOLAM HCL 2 MG/2ML IJ SOLN
INTRAMUSCULAR | Status: AC
  Filled 2017-04-19: qty 2

## 2017-04-19 MED ORDER — IOPAMIDOL (ISOVUE-370) INJECTION 76%
INTRAVENOUS | Status: AC
  Filled 2017-04-19: qty 50

## 2017-04-19 MED ORDER — VERAPAMIL HCL 2.5 MG/ML IV SOLN
INTRAVENOUS | Status: DC | PRN
  Administered 2017-04-19: 10 mL via INTRA_ARTERIAL

## 2017-04-19 MED ORDER — SODIUM CHLORIDE 0.9 % WEIGHT BASED INFUSION
3.0000 mL/kg/h | INTRAVENOUS | Status: AC
  Administered 2017-04-19: 3 mL/kg/h via INTRAVENOUS

## 2017-04-19 MED ORDER — HEPARIN SODIUM (PORCINE) 1000 UNIT/ML IJ SOLN
INTRAMUSCULAR | Status: DC | PRN
  Administered 2017-04-19: 3000 [IU] via INTRAVENOUS
  Administered 2017-04-19: 1500 [IU] via INTRAVENOUS
  Administered 2017-04-19: 3000 [IU] via INTRAVENOUS

## 2017-04-19 MED ORDER — MIDAZOLAM HCL 2 MG/2ML IJ SOLN
INTRAMUSCULAR | Status: DC | PRN
  Administered 2017-04-19: 1 mg via INTRAVENOUS

## 2017-04-19 MED ORDER — HEPARIN (PORCINE) IN NACL 2-0.9 UNIT/ML-% IJ SOLN
INTRAMUSCULAR | Status: AC
  Filled 2017-04-19: qty 1000

## 2017-04-19 MED ORDER — ADENOSINE (DIAGNOSTIC) 140MCG/KG/MIN
INTRAVENOUS | Status: DC | PRN
  Administered 2017-04-19: 140 ug/kg/min via INTRAVENOUS

## 2017-04-19 MED ORDER — SODIUM CHLORIDE 0.9% FLUSH: 3.0000 mL | INTRAVENOUS | Status: DC | PRN

## 2017-04-19 MED ORDER — FENTANYL CITRATE (PF) 100 MCG/2ML IJ SOLN
INTRAMUSCULAR | Status: DC | PRN
  Administered 2017-04-19: 50 ug via INTRAVENOUS

## 2017-04-19 MED ORDER — LABETALOL HCL 5 MG/ML IV SOLN: 10.0000 mg | INTRAVENOUS | Status: DC | PRN

## 2017-04-19 MED ORDER — HYDRALAZINE HCL 20 MG/ML IJ SOLN: 5.0000 mg | INTRAMUSCULAR | Status: DC | PRN

## 2017-04-19 MED ORDER — NITROGLYCERIN 1 MG/10 ML FOR IR/CATH LAB
INTRA_ARTERIAL | Status: DC | PRN
  Administered 2017-04-19: 200 ug via INTRACORONARY

## 2017-04-19 MED ORDER — ASPIRIN 81 MG PO CHEW: 81.0000 mg | CHEWABLE_TABLET | ORAL | Status: DC

## 2017-04-19 MED ORDER — ADENOSINE 12 MG/4ML IV SOLN
INTRAVENOUS | Status: AC
  Filled 2017-04-19: qty 16

## 2017-04-19 MED ORDER — SODIUM CHLORIDE 0.9 % IV SOLN: INTRAVENOUS | Status: AC

## 2017-04-19 MED ORDER — SODIUM CHLORIDE 0.9 % WEIGHT BASED INFUSION: 1.0000 mL/kg/h | INTRAVENOUS | Status: DC

## 2017-04-19 MED ORDER — FENTANYL CITRATE (PF) 100 MCG/2ML IJ SOLN
INTRAMUSCULAR | Status: AC
  Filled 2017-04-19: qty 2

## 2017-04-19 MED ORDER — IOHEXOL 350 MG/ML SOLN
INTRAVENOUS | Status: DC | PRN
  Administered 2017-04-19: 90 mL via INTRAVENOUS

## 2017-04-19 MED ORDER — SODIUM CHLORIDE 0.9% FLUSH: 3.0000 mL | Freq: Two times a day (BID) | INTRAVENOUS | Status: DC

## 2017-04-19 MED ORDER — LIDOCAINE HCL (PF) 1 % IJ SOLN
INTRAMUSCULAR | Status: DC | PRN
  Administered 2017-04-19: 2 mL

## 2017-04-19 MED ORDER — HEPARIN (PORCINE) IN NACL 2-0.9 UNIT/ML-% IJ SOLN
INTRAMUSCULAR | Status: AC | PRN
  Administered 2017-04-19: 1000 mL

## 2017-04-19 SURGICAL SUPPLY — 15 items
CATH IMPULSE 5F ANG/FL3.5 (CATHETERS) ×1 IMPLANT
CATH INFINITI 5 FR JL3.5 (CATHETERS) ×1 IMPLANT
CATH LAUNCHER 6FR EBU3.5 (CATHETERS) ×1 IMPLANT
CATH MICROCATH NAVVUS (MICROCATHETER) IMPLANT
DEVICE RAD COMP TR BAND LRG (VASCULAR PRODUCTS) ×1 IMPLANT
GLIDESHEATH SLEND SS 6F .021 (SHEATH) ×1 IMPLANT
GUIDEWIRE INQWIRE 1.5J.035X260 (WIRE) IMPLANT
INQWIRE 1.5J .035X260CM (WIRE) ×2
KIT ESSENTIALS PG (KITS) ×1 IMPLANT
KIT HEART LEFT (KITS) ×2 IMPLANT
MICROCATHETER NAVVUS (MICROCATHETER) ×2
PACK CARDIAC CATHETERIZATION (CUSTOM PROCEDURE TRAY) ×2 IMPLANT
TRANSDUCER W/STOPCOCK (MISCELLANEOUS) ×2 IMPLANT
TUBING CIL FLEX 10 FLL-RA (TUBING) ×2 IMPLANT
WIRE RUNTHROUGH .014X180CM (WIRE) ×1 IMPLANT

## 2017-04-19 NOTE — Discharge Instructions (Signed)

## 2017-04-19 NOTE — Brief Op Note (Signed)
BRIEF CARDIAC CATHETERIZATION NOTE  DATE: 04/19/2017 TIME: 8:36 AM  PATIENT:  [name]  72 y.o. female  PRE-OPERATIVE DIAGNOSIS:  Accelerating angina  POST-OPERATIVE DIAGNOSIS:  Same  PROCEDURE:  Procedure(s): LEFT HEART CATH AND CORONARY ANGIOGRAPHY (N/A) INTRAVASCULAR PRESSURE WIRE/FFR STUDY (N/A)  SURGEON:  Surgeon(s) and Role:    * Payeton Germani, Harrell Gave, MD - Primary  FINDINGS: 1. Non-obstructive CAD. 50% mid LAD (FFR 0.92). 2. Normal LV contraction with mildly elevated LVEDP.  RECOMMENDATIONS: 1. Medical therapy.  Nelva Bush, MD [facility] Pager: [phone]

## 2017-04-19 NOTE — Research (Signed)
CAD FEM Informed Consent   Subject Name: [name]  Subject met inclusion and exclusion criteria.  The informed consent form, study requirements and expectations were reviewed with the subject and questions and concerns were addressed prior to the signing of the consent form.  The subject verbalized understanding of the trail requirements.  The subject agreed to participate in the CAD FEM trial and signed the informed consent.  The informed consent was obtained prior to performance of any protocol-specific procedures for the subject.  A copy of the signed informed consent was given to the subject and a copy was placed in the subject's medical record.  [name],[name] 04/19/2017, [id number]

## 2017-04-19 NOTE — Interval H&P Note (Signed)
History and Physical Interval Note:  04/19/2017 6:47 AM  [name]  has presented today for cardiac catheterization, with the diagnosis of accelerating angina. The various methods of treatment have been discussed with the patient and family. After consideration of risks, benefits and other options for treatment, the patient has consented to  Procedure(s): LEFT HEART CATH AND CORONARY ANGIOGRAPHY (N/A) as a surgical intervention .  The patient's history has been reviewed, patient examined, no change in status, stable for surgery.  I have reviewed the patient's chart and labs.  Questions were answered to the patient's satisfaction.    Cath Lab Visit (complete for each Cath Lab visit)  Clinical Evaluation Leading to the Procedure:   ACS: No.  Non-ACS:    Anginal Classification: CCS III  Anti-ischemic medical therapy: Minimal Therapy (1 class of medications)  Non-Invasive Test Results: Low-risk stress test findings: cardiac mortality <1%/year  Prior CABG: No previous CABG  [name]

## 2017-04-22 ENCOUNTER — Encounter (HOSPITAL_COMMUNITY): Payer: Self-pay | Admitting: Internal Medicine

## 2017-05-02 DIAGNOSIS — H401132 Primary open-angle glaucoma, bilateral, moderate stage: Secondary | ICD-10-CM | POA: Diagnosis not present

## 2017-05-15 ENCOUNTER — Encounter: Payer: Self-pay | Admitting: Internal Medicine

## 2017-05-21 DIAGNOSIS — R69 Illness, unspecified: Secondary | ICD-10-CM | POA: Diagnosis not present

## 2017-05-23 ENCOUNTER — Ambulatory Visit: Payer: Medicare HMO | Admitting: Internal Medicine

## 2017-05-23 ENCOUNTER — Encounter: Payer: Self-pay | Admitting: *Deleted

## 2017-05-23 ENCOUNTER — Encounter: Payer: Self-pay | Admitting: Internal Medicine

## 2017-05-23 VITALS — BP 130/74 | HR 75 | Ht 62.0 in | Wt 126.0 lb

## 2017-05-23 DIAGNOSIS — I739 Peripheral vascular disease, unspecified: Secondary | ICD-10-CM | POA: Diagnosis not present

## 2017-05-23 DIAGNOSIS — I25118 Atherosclerotic heart disease of native coronary artery with other forms of angina pectoris: Secondary | ICD-10-CM | POA: Diagnosis not present

## 2017-05-23 DIAGNOSIS — E785 Hyperlipidemia, unspecified: Secondary | ICD-10-CM

## 2017-05-23 NOTE — Patient Instructions (Addendum)
Medication Instructions:  Your physician recommends that you continue on your current medications as directed. Please refer to the Current Medication list given to you today.  -- If you need a refill on your cardiac medications before your next appointment, please call your pharmacy. --  Labwork: None ordered  Testing/Procedures: None ordered  Follow-Up: Your physician wants you to follow-up in: 1 year with Dr. End.    You will receive a reminder letter in the mail two months in advance. If you don't receive a letter, please call our office to schedule the follow-up appointment.  Thank you for choosing [facility]!!    Any Other Special Instructions Will Be Listed Below (If Applicable).         

## 2017-05-23 NOTE — Progress Notes (Signed)
Follow-up Outpatient Visit Date: 05/23/2017  Primary Care Provider: Esaw Grandchild, NP [place] [address]  Chief Complaint: Chest pain and dizziness  HPI:  [name] is a 72 y.o. year-old female with history of nonobstructive CAD, peripheral vascular disease with right vertebral artery occlusion and left subclavian artery stenosis status post stenting (12/2016),stroke in 2016, hyperlipidemia, hypothyroidism, and GERD, who presents for follow-up of chest pain. I last saw her in late January, at which time she reported continued exertional substernal chest "burning." Subsequent cath showed non-obstructive CAD with negative FFR of the LAD.  Today, [name] reports that she is feeling about the same.  She did not have any chest pain following her cath up until she began walking the last few days.  She still describes a burning with going uphill that resolves promptly with rest.  It is no worse than at prior visits.  She does not have any associated symptoms.  She also makes note of intermittent dizziness when she turns her head quickly.  This is been present for years.  She is concerned about the potential for thoracic outlet syndrome or stimulation of the vagus nerve.  She denies shortness of breath, palpitations, orthopnea, PND, and edema.  She is tolerating her medications well, including low-dose isosorbide mononitrate.  She remains on dual antiplatelet therapy with aspirin and clopidogrel following left subclavian artery stenting by Dr. Trula Slade last fall.  She is scheduled to follow-up with him again in the spring.  --------------------------------------------------------------------------------------------------  Cardiovascular History & Procedures: Cardiovascular Problems:  Peripheral vascular disease  Stroke  Risk Factors:  Peripheral and cerebrovascular disease, hyperlipidemia, and age greater than 7  Cath/PCI:  LHC (04/19/17): LMCA normal. LAD with  sequential 50% and 25% lesions (FFR 0.92). Ramus with minimal disease. LCx normal. RCA with mild diffuse disease. LVEDP 17 mmHg. LVEF 55-65%.  CV Surgery:  Aortography and left subclavian angiography; intervention (01/01/17, Dr. Trula Slade): Occluded right vertebral artery. 80% left subclavian artery stenosis with successful intervention with Xpress 8 x 25 mm balloon-expandable stent.  EP Procedures and Devices:  30-day event monitor (11/28/16): Sinus rhythm. No significant arrhythmia.  Non-Invasive Evaluation(s):  Exercise MPI (11/22/16): Low risk study with small in size, mild in severity, fixed apical anterior and apical defect likely representing apical thinning. LVEF >65% with normal wall motion. Patient complained of fatigue during test, exercising 5:30 minutes.  Upper extremity arterial Doppler (10/26/16): Right upper extremity evaluation normal. Left upper extremity demonstrates dampened biphasic to monophasic waveforms and 20 mmHg pressure difference compared to the right.  Carotid artery duplex (10/18/16): Bilateral 1-39% stenoses involving the right and left internal carotid arteries.  Transthoracic echocardiogram (12/28/14): Normal LV size and function. LVEF 60-65% with grade 1 diastolic dysfunction. Normal RV size and function. No significant vascular abnormalities.  Recent CV Pertinent Labs: Lab Results  Component Value Date   CHOL 158 04/03/2017   HDL 60 04/03/2017   LDLCALC 86 04/03/2017   TRIG 58 04/03/2017   CHOLHDL 2.6 04/03/2017   CHOLHDL 3.9 12/27/2014   INR 1.0 04/12/2017   K 5.3 (H) 04/12/2017   BUN 15 04/12/2017   CREATININE 0.71 04/12/2017    Past medical and surgical history were reviewed and updated in EPIC.  Current Meds  Medication Sig  . acetaminophen (TYLENOL) 500 MG tablet Take 500 mg by mouth daily as needed for moderate pain or headache.   Marland Kitchen aspirin EC 81 MG tablet Take 81 mg by mouth daily.  . calcium carbonate (  OSCAL) 1500 (600 Ca) MG TABS  tablet Take 600 mg of elemental calcium by mouth daily with breakfast.  . clopidogrel (PLAVIX) 75 MG tablet Take 1 tablet (75 mg total) by mouth daily.  . diazepam (VALIUM) 5 MG tablet Take 5 mg by mouth at bedtime as needed for anxiety (sleep).   . diphenhydrAMINE (BENADRYL) 25 MG tablet Take 25 mg by mouth at bedtime as needed for sleep.  . dorzolamide (TRUSOPT) 2 % ophthalmic solution Place 1 drop into both eyes daily.   . isosorbide mononitrate (IMDUR) 30 MG 24 hr tablet Take 1 tablet (30 mg total) by mouth daily. (Patient taking differently: Take 15 mg by mouth daily. )  . latanoprost (XALATAN) 0.005 % ophthalmic solution Place 1 drop into both eyes at bedtime.  Marland Kitchen levothyroxine (SYNTHROID, LEVOTHROID) 100 MCG tablet Take 100 mcg by mouth daily.  . Multiple Vitamin (MULTIVITAMIN) tablet Take 1 tablet by mouth daily.  . pantoprazole (PROTONIX) 40 MG tablet Take 40 mg by mouth daily as needed (indigestion).  . rosuvastatin (CRESTOR) 20 MG tablet Take 1 tablet (20 mg total) by mouth daily.  . timolol (BETIMOL) 0.5 % ophthalmic solution Place 1 drop into both eyes daily.    Allergies: Neuromuscular blocking agents and Vicodin [hydrocodone-acetaminophen]  Social History   Socioeconomic History  . Marital status: Married    Spouse name: Not on file  . Number of children: 1  . Years of education: Not on file  . Highest education level: Not on file  Social Needs  . Financial resource strain: Not on file  . Food insecurity - worry: Not on file  . Food insecurity - inability: Not on file  . Transportation needs - medical: Not on file  . Transportation needs - non-medical: Not on file  Occupational History  . Occupation: Merchant navy officer    Comment: @ [employer] on [employer]  Tobacco Use  . Smoking status: Former Smoker    Packs/day: 0.25    Types: Cigarettes    Last attempt to quit: 03/12/1978    Years since quitting: 39.2  . Smokeless tobacco: Never Used  . Tobacco comment:  quit in 1978  Substance and Sexual Activity  . Alcohol use: Yes    Alcohol/week: 4.2 oz    Types: 7 Glasses of wine per week  . Drug use: No  . Sexual activity: Not Currently    Birth control/protection: None  Other Topics Concern  . Not on file  Social History Narrative  . Not on file    Family History  Problem Relation Age of Onset  . Other Father 37       SEPSIS  . Hypertension Father   . Stroke Father 60       CVA  . Heart failure Mother 76  . Heart murmur Mother   . Heart attack Sister 85  . Diabetes Brother   . Breast cancer Sister   . Stroke Sister   . Cirrhosis Brother   . Hypertension Sister        x 2  . Diabetes Sister   . Heart murmur Daughter     Review of Systems: A 12-system review of systems was performed and was negative except as noted in the HPI.  --------------------------------------------------------------------------------------------------  Physical Exam: BP 130/74   Pulse 75   Ht 5\' 2"  (1.575 m)   Wt 126 lb (57.2 kg)   BMI 23.05 kg/m   General: Well-developed, well-nourished woman, seated comfortably in the exam  room. HEENT: No conjunctival pallor or scleral icterus. Moist mucous membranes.  OP clear. Neck: Supple without lymphadenopathy, thyromegaly, JVD, or HJR. No carotid bruit. Lungs: Normal work of breathing. Clear to auscultation bilaterally without wheezes or crackles. Heart: Regular rate and rhythm without murmurs, rubs, or gallops. Non-displaced PMI. Abd: Bowel sounds present. Soft, NT/ND without hepatosplenomegaly Ext: No lower extremity edema. Radial, PT, and DP pulses are 2+ bilaterally.  Right radial arteriotomy site is well-healed. Skin: Warm and dry without rash.  EKG:  NSR without abnormalities.  Lab Results  Component Value Date   WBC 5.5 04/12/2017   HGB 14.9 04/12/2017   HCT 44.9 04/12/2017   MCV 91 04/12/2017   PLT 285 04/12/2017    Lab Results  Component Value Date   NA 134 04/12/2017   K 5.3 (H)  04/12/2017   CL 96 04/12/2017   CO2 21 04/12/2017   BUN 15 04/12/2017   CREATININE 0.71 04/12/2017   GLUCOSE 122 (H) 04/12/2017   ALT 26 04/03/2017    Lab Results  Component Value Date   CHOL 158 04/03/2017   HDL 60 04/03/2017   LDLCALC 86 04/03/2017   TRIG 58 04/03/2017   CHOLHDL 2.6 04/03/2017    --------------------------------------------------------------------------------------------------  ASSESSMENT AND PLAN: Nonobstructive coronary artery disease with stable angina Overall, exertional symptoms are similar to precath.  Ms. Fleer does not report significant improvement with isosorbide mononitrate.  I still up suspicious for either microvascular dysfunction or a noncardiac cause for her pain I will have her continue to exercise, as hopefully this will improve her exercise capacity and lessen her exertional chest pain.  She can continue with isosorbide and potentially increase this to 30 mg daily, if she tolerates this from a headache standpoint.  She will continue aggressive secondary prevention with antiplatelet therapy and statin.  Peripheral vascular disease No left arm symptoms to suggest restenosis of the left subclavian artery.  Continue DAPT under the direction of Dr. Trula Slade as well as statin therapy.  Hyperlipidemia Most recent LDL slightly above goal in January.  Rosuvastatin was increased to 20 mg daily at that time, which we will continue with.  Follow-up: Return to clinic in 1 year.  Nelva Bush, MD 05/23/2017 2:13 PM

## 2017-07-05 ENCOUNTER — Other Ambulatory Visit: Payer: Self-pay | Admitting: Internal Medicine

## 2017-07-05 DIAGNOSIS — E785 Hyperlipidemia, unspecified: Secondary | ICD-10-CM

## 2017-07-05 DIAGNOSIS — I739 Peripheral vascular disease, unspecified: Secondary | ICD-10-CM

## 2017-07-05 NOTE — Telephone Encounter (Signed)
Please review for refill, Thanks !  

## 2017-07-06 ENCOUNTER — Other Ambulatory Visit: Payer: Self-pay | Admitting: Adult Health

## 2017-07-08 ENCOUNTER — Other Ambulatory Visit: Payer: Self-pay | Admitting: Adult Health

## 2017-07-08 MED ORDER — CLOPIDOGREL BISULFATE 75 MG PO TABS: 75.0000 mg | ORAL_TABLET | Freq: Every day | ORAL | 2 refills | Status: DC

## 2017-07-08 NOTE — Progress Notes (Unsigned)
Cards recommended that she remain on Clopidogrel 75mg  QD, not to stop and replaced with ASA per Neuro rec

## 2017-07-08 NOTE — Telephone Encounter (Signed)
Your 02/25/17 note stated that pt's neurologist advised her to stop clopidogrel once her current supply was depleted.  Please review refill request.  Charyl Bigger, CMA

## 2017-07-09 ENCOUNTER — Other Ambulatory Visit: Payer: Medicare HMO

## 2017-07-09 DIAGNOSIS — E785 Hyperlipidemia, unspecified: Secondary | ICD-10-CM | POA: Diagnosis not present

## 2017-07-09 LAB — LIPID PANEL
CHOLESTEROL TOTAL: 140 mg/dL (ref 100–199)
Chol/HDL Ratio: 2.6 ratio (ref 0.0–4.4)
HDL: 53 mg/dL (ref >39)
LDL Calculated: 65 mg/dL (ref 0–99)
TRIGLYCERIDES: 112 mg/dL (ref 0–149)
VLDL Cholesterol Cal: 22 mg/dL (ref 5–40)

## 2017-07-09 LAB — ALT: ALT: 23 IU/L (ref 0–32)

## 2017-07-11 DIAGNOSIS — E890 Postprocedural hypothyroidism: Secondary | ICD-10-CM | POA: Diagnosis not present

## 2017-07-15 ENCOUNTER — Encounter: Payer: Self-pay | Admitting: *Deleted

## 2017-07-15 DIAGNOSIS — Z006 Encounter for examination for normal comparison and control in clinical research program: Secondary | ICD-10-CM

## 2017-07-15 NOTE — Progress Notes (Signed)
STROKE~AF Research study month 30 telephone follow up completed. Patient has added Imdur 30 mg 1/2 pill daily to her medication. Patient denies any  stroke like symptoms. Next research required visit is due 20/sep/2019 and that will conclude the follow up.

## 2017-07-18 DIAGNOSIS — E890 Postprocedural hypothyroidism: Secondary | ICD-10-CM | POA: Diagnosis not present

## 2017-07-22 DIAGNOSIS — R69 Illness, unspecified: Secondary | ICD-10-CM | POA: Diagnosis not present

## 2017-07-26 ENCOUNTER — Emergency Department (HOSPITAL_BASED_OUTPATIENT_CLINIC_OR_DEPARTMENT_OTHER)
Admission: EM | Admit: 2017-07-26 | Discharge: 2017-07-26 | Disposition: A | Discharge status: Home / Self Care | Payer: Medicare HMO | Attending: Emergency Medicine | Admitting: Emergency Medicine

## 2017-07-26 ENCOUNTER — Other Ambulatory Visit: Payer: Self-pay

## 2017-07-26 ENCOUNTER — Encounter (HOSPITAL_BASED_OUTPATIENT_CLINIC_OR_DEPARTMENT_OTHER): Payer: Self-pay | Admitting: Emergency Medicine

## 2017-07-26 ENCOUNTER — Emergency Department (HOSPITAL_BASED_OUTPATIENT_CLINIC_OR_DEPARTMENT_OTHER): Payer: Medicare HMO

## 2017-07-26 DIAGNOSIS — I2510 Atherosclerotic heart disease of native coronary artery without angina pectoris: Secondary | ICD-10-CM | POA: Insufficient documentation

## 2017-07-26 DIAGNOSIS — K5641 Fecal impaction: Secondary | ICD-10-CM | POA: Diagnosis not present

## 2017-07-26 DIAGNOSIS — E039 Hypothyroidism, unspecified: Secondary | ICD-10-CM | POA: Insufficient documentation

## 2017-07-26 DIAGNOSIS — K5900 Constipation, unspecified: Secondary | ICD-10-CM | POA: Diagnosis not present

## 2017-07-26 DIAGNOSIS — Z7902 Long term (current) use of antithrombotics/antiplatelets: Secondary | ICD-10-CM | POA: Diagnosis not present

## 2017-07-26 DIAGNOSIS — Z7982 Long term (current) use of aspirin: Secondary | ICD-10-CM | POA: Diagnosis not present

## 2017-07-26 DIAGNOSIS — Z87891 Personal history of nicotine dependence: Secondary | ICD-10-CM | POA: Diagnosis not present

## 2017-07-26 DIAGNOSIS — R1030 Lower abdominal pain, unspecified: Secondary | ICD-10-CM | POA: Diagnosis not present

## 2017-07-26 DIAGNOSIS — K5730 Diverticulosis of large intestine without perforation or abscess without bleeding: Secondary | ICD-10-CM | POA: Diagnosis not present

## 2017-07-26 DIAGNOSIS — Z79899 Other long term (current) drug therapy: Secondary | ICD-10-CM | POA: Diagnosis not present

## 2017-07-26 LAB — CBC WITH DIFFERENTIAL/PLATELET
BASOS PCT: 0 %
Basophils Absolute: 0 10*3/uL (ref 0.0–0.1)
Eosinophils Absolute: 0 10*3/uL (ref 0.0–0.7)
Eosinophils Relative: 0 %
HEMATOCRIT: 44.9 % (ref 36.0–46.0)
Hemoglobin: 15.2 g/dL — ABNORMAL HIGH (ref 12.0–15.0)
LYMPHS ABS: 1.3 10*3/uL (ref 0.7–4.0)
LYMPHS PCT: 13 %
MCH: 30.6 pg (ref 26.0–34.0)
MCHC: 33.9 g/dL (ref 30.0–36.0)
MCV: 90.3 fL (ref 78.0–100.0)
MONO ABS: 0.7 10*3/uL (ref 0.1–1.0)
MONOS PCT: 7 %
NEUTROS ABS: 7.9 10*3/uL — AB (ref 1.7–7.7)
Neutrophils Relative %: 80 %
Platelets: 252 10*3/uL (ref 150–400)
RBC: 4.97 MIL/uL (ref 3.87–5.11)
RDW: 12.9 % (ref 11.5–15.5)
WBC: 9.9 10*3/uL (ref 4.0–10.5)

## 2017-07-26 LAB — URINALYSIS, ROUTINE W REFLEX MICROSCOPIC
BILIRUBIN URINE: NEGATIVE
GLUCOSE, UA: NEGATIVE mg/dL
HGB URINE DIPSTICK: NEGATIVE
Ketones, ur: 15 mg/dL — AB
Leukocytes, UA: NEGATIVE
Nitrite: NEGATIVE
Protein, ur: NEGATIVE mg/dL
pH: 5.5 (ref 5.0–8.0)

## 2017-07-26 LAB — COMPREHENSIVE METABOLIC PANEL
ALBUMIN: 4.5 g/dL (ref 3.5–5.0)
ALK PHOS: 86 U/L (ref 38–126)
ALT: 24 U/L (ref 14–54)
ANION GAP: 10 (ref 5–15)
AST: 28 U/L (ref 15–41)
BUN: 13 mg/dL (ref 6–20)
CALCIUM: 9 mg/dL (ref 8.9–10.3)
CO2: 23 mmol/L (ref 22–32)
Chloride: 101 mmol/L (ref 101–111)
Creatinine, Ser: 0.61 mg/dL (ref 0.44–1.00)
GFR calc Af Amer: >60 mL/min (ref >60)
GFR calc non Af Amer: >60 mL/min (ref >60)
GLUCOSE: 108 mg/dL — AB (ref 65–99)
Potassium: 4.3 mmol/L (ref 3.5–5.1)
SODIUM: 134 mmol/L — AB (ref 135–145)
Total Bilirubin: 0.8 mg/dL (ref 0.3–1.2)
Total Protein: 7.3 g/dL (ref 6.5–8.1)

## 2017-07-26 LAB — LIPASE, BLOOD: Lipase: 29 U/L (ref 11–51)

## 2017-07-26 MED ORDER — MORPHINE SULFATE (PF) 4 MG/ML IV SOLN
4.0000 mg | Freq: Once | INTRAVENOUS | Status: AC
  Administered 2017-07-26: 4 mg via INTRAVENOUS
  Filled 2017-07-26: qty 1

## 2017-07-26 MED ORDER — ONDANSETRON HCL 4 MG/2ML IJ SOLN
4.0000 mg | Freq: Once | INTRAMUSCULAR | Status: AC
  Administered 2017-07-26: 4 mg via INTRAVENOUS
  Filled 2017-07-26: qty 2

## 2017-07-26 MED ORDER — SODIUM CHLORIDE 0.9 % IV BOLUS
500.0000 mL | Freq: Once | INTRAVENOUS | Status: AC
  Administered 2017-07-26: 500 mL via INTRAVENOUS

## 2017-07-26 MED ORDER — IOPAMIDOL (ISOVUE-300) INJECTION 61%
100.0000 mL | Freq: Once | INTRAVENOUS | Status: AC | PRN
  Administered 2017-07-26: 100 mL via INTRAVENOUS

## 2017-07-26 NOTE — ED Notes (Signed)
Pt returns from CT scan

## 2017-07-26 NOTE — ED Notes (Signed)
Pt placed on cont POX monitoring

## 2017-07-26 NOTE — ED Notes (Signed)
Presents to ED w/ c/o lower abd pain, states has been constipated for the past few days, last PM states took OTC laxative, states at approxmately 5am felt the urge to have BM, states had cramping and was unable to have a normal BM, states it was loose and painful, had episode of vomiting x 3, pt states had a sigmoidectomy in 2008, she is concerned she has an intestinal blockage.

## 2017-07-26 NOTE — ED Notes (Addendum)
Note charted in error

## 2017-07-26 NOTE — ED Provider Notes (Signed)
[facility] EMERGENCY DEPARTMENT Provider Note   CSN: [id number] Arrival date & time: 07/26/17  1438     History   Chief Complaint Chief Complaint  Patient presents with  . Constipation  . Abdominal Pain    HPI [name] is a 72 y.o. female.  The history is provided by the patient. No language interpreter was used.  Constipation   Associated symptoms include abdominal pain.  Abdominal Pain   Associated symptoms include constipation.   [name] is a 72 y.o. female who presents to the Emergency Department complaining of abdominal pain, constipation. She reports that several days of constipation with very small caliber stools. Last night she took a bisacodyl laxative and when she awoke this morning she reports severe lower abdominal cramping as well as very small amounts of liquid stool. She has associated episodes of emesis times three. No fevers. Cramping is across her lower abdomen and it is waxing and waning in nature. She has a history of prior sigmoidectomy for perforated diverticulitis - symptoms were similar at that time but were constant instead of waxing and waning.   Past Medical History:  Diagnosis Date  . Anxiety   . Barrett esophagus 2003  . Diverticulitis   . Diverticulosis   . GERD (gastroesophageal reflux disease)   . Glaucoma   . Grave's disease   . H. pylori infection   . Hepatitis A    in her 78 from food  . Hyperlipidemia   . Hypothyroidism   . Increased pressure in the eye   . Insomnia   . Morton's neuroma   . Murmur   . Scoliosis   . Stroke [facility]) 12/2014   no deficits    Patient Active Problem List   Diagnosis Date Noted  . Coronary artery disease of native artery of native heart with stable angina pectoris ([place]) 05/23/2017  . Peripheral vascular disease ([place]) 05/23/2017  . Vaginal dryness 02/25/2017  . Depression, recurrent ([place]) 02/25/2017  . Stable angina ([place]) 01/08/2017  . History of stroke 01/08/2017  .  Accelerating angina ([place]) 11/17/2016  . Subclavian artery stenosis, left ([place]) 11/17/2016  . Palpitations 11/17/2016  . Hyperlipidemia LDL goal <70 11/17/2016  . Unequal blood pressure in upper extremities 10/22/2016  . Hypotension 08/07/2016  . Health care maintenance 05/29/2016  . Cerebral infarction due to thrombosis of right vertebral artery ([place])   . Cerebral infarction ([place]) 12/26/2014  . Anxiety 12/26/2014  . Hypothyroidism 12/26/2014  . GERD (gastroesophageal reflux disease) 12/26/2014  . Glaucoma 12/26/2014  . Allergic rhinitis 12/26/2014  . Cerebrovascular accident (CVA) ([place]) 12/26/2014  . Cerebral infarction due to embolism of right vertebral artery ([place])   . Visual field loss   . Occlusion and stenosis of vertebral artery   . Insomnia 03/23/2013  . Morton's neuroma 11/02/2011  . Chronic sinusitis 10/09/2011    Past Surgical History:  Procedure Laterality Date  . AORTIC ARCH ANGIOGRAPHY N/A 01/01/2017   Procedure: AORTIC ARCH ANGIOGRAPHY;  Surgeon: Serafina Mitchell, MD;  Location: [place] CV LAB;  Service: Cardiovascular;  Laterality: N/A;  . APPENDECTOMY  1958  . CATARACT EXTRACTION Left   . CATARACT EXTRACTION W/PHACO Left 07/02/2016   Procedure: CATARACT EXTRACTION PHACO AND INTRAOCULAR LENS PLACEMENT ([place])  Left;  Surgeon: Ronnell Freshwater, MD;  Location: [place] [facility];  Service: Ophthalmology;  Laterality: Left;  . CERVICAL LAMINECTOMY  1995  . COLON SURGERY    . EYE SURGERY     due  to graves disease  . FEMUR SURGERY Right    benign bone growth  . FOOT NEUROMA SURGERY Right   . INTRAVASCULAR PRESSURE WIRE/FFR STUDY N/A 04/19/2017   Procedure: INTRAVASCULAR PRESSURE WIRE/FFR STUDY;  Surgeon: Nelva Bush, MD;  Location: [place] CV LAB;  Service: Cardiovascular;  Laterality: N/A;  . LAPAROSCOPIC ASSISTED VAGINAL HYSTERECTOMY    . LAPAROSCOPIC SIGMOID COLECTOMY  05/13/06   Dr Zella Richer  . LEFT HEART CATH AND CORONARY ANGIOGRAPHY N/A  04/19/2017   Procedure: LEFT HEART CATH AND CORONARY ANGIOGRAPHY;  Surgeon: Nelva Bush, MD;  Location: [place] CV LAB;  Service: Cardiovascular;  Laterality: N/A;  . LUMBAR [place] SURGERY  2009  . PERIPHERAL VASCULAR INTERVENTION Left 01/01/2017   Procedure: PERIPHERAL VASCULAR INTERVENTION;  Surgeon: Serafina Mitchell, MD;  Location: [place] CV LAB;  Service: Cardiovascular;  Laterality: Left;  . SUBCLAVIAN STENT PLACEMENT    . TONSILLECTOMY  1992  . TUBAL LIGATION    . UPPER EXTREMITY ANGIOGRAPHY Left 01/01/2017   Procedure: Upper Extremity Angiography;  Surgeon: Serafina Mitchell, MD;  Location: [place] CV LAB;  Service: Cardiovascular;  Laterality: Left;     OB History   None      Home Medications    Prior to Admission medications   Medication Sig Start Date End Date Taking? Authorizing Provider  acetaminophen (TYLENOL) 500 MG tablet Take 500 mg by mouth daily as needed for moderate pain or headache.    Yes [provider]  aspirin EC 81 MG tablet Take 81 mg by mouth daily.   Yes [provider]  clopidogrel (PLAVIX) 75 MG tablet Take 1 tablet (75 mg total) by mouth daily. 07/08/17  Yes Danford, Valetta Fuller D, NP  diazepam (VALIUM) 5 MG tablet Take 5 mg by mouth at bedtime as needed for anxiety (sleep).  08/20/14  Yes [provider]  diphenhydrAMINE (BENADRYL) 25 MG tablet Take 25 mg by mouth at bedtime as needed for sleep.   Yes [provider]  latanoprost (XALATAN) 0.005 % ophthalmic solution Place 1 drop into both eyes at bedtime.   Yes [provider]  levothyroxine (SYNTHROID, LEVOTHROID) 100 MCG tablet Take 100 mcg by mouth daily. 02/08/17  Yes [provider]  pantoprazole (PROTONIX) 40 MG tablet Take 40 mg by mouth daily as needed (indigestion).   Yes [provider]  timolol (BETIMOL) 0.5 % ophthalmic solution Place 1 drop into both eyes daily.   Yes [provider]  calcium carbonate (OSCAL) 1500  (600 Ca) MG TABS tablet Take 600 mg of elemental calcium by mouth daily with breakfast.    [provider]  dorzolamide (TRUSOPT) 2 % ophthalmic solution Place 1 drop into both eyes daily.     [provider]  isosorbide mononitrate (IMDUR) 30 MG 24 hr tablet Take 1 tablet (30 mg total) by mouth daily. Patient taking differently: Take 15 mg by mouth daily.  04/08/17 07/07/17  End, Harrell Gave, MD  Multiple Vitamin (MULTIVITAMIN) tablet Take 1 tablet by mouth daily.    [provider]  rosuvastatin (CRESTOR) 20 MG tablet Take 1 tablet (20 mg total) by mouth daily. 04/04/17 07/03/17  End, Harrell Gave, MD    Family History Family History  Problem Relation Age of Onset  . Other Father 88       SEPSIS  . Hypertension Father   . Stroke Father 37       CVA  . Heart failure Mother 5  . Heart murmur Mother   .  Heart attack Sister 63  . Diabetes Brother   . Breast cancer Sister   . Stroke Sister   . Cirrhosis Brother   . Hypertension Sister        x 2  . Diabetes Sister   . Heart murmur Daughter     Social History Social History   Tobacco Use  . Smoking status: Former Smoker    Packs/day: 0.25    Types: Cigarettes    Last attempt to quit: 03/12/1978    Years since quitting: 39.4  . Smokeless tobacco: Never Used  . Tobacco comment: quit in 1978  Substance Use Topics  . Alcohol use: Yes    Alcohol/week: 4.2 oz    Types: 7 Glasses of wine per week  . Drug use: No     Allergies   Neuromuscular blocking agents and Vicodin [hydrocodone-acetaminophen]   Review of Systems Review of Systems  Gastrointestinal: Positive for abdominal pain and constipation.  All other systems reviewed and are negative.    Physical Exam Updated Vital Signs BP 118/67 (BP Location: Right Arm)   Pulse 72   Temp 98 F (36.7 C) (Oral)   Resp 16   Ht 5\' 2"  (1.575 m)   Wt 57.2 kg (126 lb)   SpO2 98%   BMI 23.05 kg/m   Physical Exam  Constitutional: She is oriented to  person, place, and time. She appears well-developed and well-nourished.  HENT:  Head: Normocephalic and atraumatic.  Cardiovascular: Normal rate and regular rhythm.  No murmur heard. Pulmonary/Chest: Effort normal and breath sounds normal. No respiratory distress.  Abdominal: Soft. Bowel sounds are normal.  Moderate generalized abdominal tenderness with voluntary guarding across the lower abdomen.  Genitourinary:  Genitourinary Comments: Empty rectal vault. Nontender rectal exam.  Musculoskeletal: She exhibits no edema or tenderness.  Neurological: She is alert and oriented to person, place, and time.  Skin: Skin is warm and dry.  Psychiatric: She has a normal mood and affect. Her behavior is normal.  Nursing note and vitals reviewed.    ED Treatments / Results  Labs (all labs ordered are listed, but only abnormal results are displayed) Labs Reviewed  COMPREHENSIVE METABOLIC PANEL - Abnormal; Notable for the following components:      Result Value   Sodium 134 (*)    Glucose, Bld 108 (*)    All other components within normal limits  CBC WITH DIFFERENTIAL/PLATELET - Abnormal; Notable for the following components:   Hemoglobin 15.2 (*)    Neutro Abs 7.9 (*)    All other components within normal limits  URINALYSIS, ROUTINE W REFLEX MICROSCOPIC - Abnormal; Notable for the following components:   Specific Gravity, Urine >1.030 (*)    Ketones, ur 15 (*)    All other components within normal limits  LIPASE, BLOOD    EKG None  Radiology Ct Abdomen Pelvis W Contrast  Result Date: 07/26/2017 CLINICAL DATA:  Constipation for a few days with onset of abdominal pain today. History of sigmoid colectomy 10 years ago. EXAM: CT ABDOMEN AND PELVIS WITH CONTRAST TECHNIQUE: Multidetector CT imaging of the abdomen and pelvis was performed using the standard protocol following bolus administration of intravenous contrast. CONTRAST:  100 ml ISOVUE-300 IOPAMIDOL (ISOVUE-300) INJECTION 61%  COMPARISON:  CT abdomen and pelvis 10/07/2014. FINDINGS: Lower chest: Lung bases are clear. No pleural or pericardial effusion. Hepatobiliary: 0.4 cm cyst in the right hepatic lobe is unchanged. The liver otherwise appears normal. Gallbladder and biliary tree are normal appearance. Pancreas: Unremarkable. No  pancreatic ductal dilatation or surrounding inflammatory changes. Spleen: Normal in size without focal abnormality. Adrenals/Urinary Tract: Adrenal glands are unremarkable. Kidneys are normal, without renal calculi, focal lesion, or hydronephrosis. Bladder is unremarkable. Stomach/Bowel: Postoperative change of sigmoid colectomy is identified. A moderate volume of stool is seen throughout the remainder of the colon with scattered diverticula noted. There is a large stool ball in the rectosigmoid colon. The stomach and small bowel appear normal. There is no bowel obstruction. Vascular/Lymphatic: Aortic atherosclerosis. No enlarged abdominal or pelvic lymph nodes. Reproductive: Status post hysterectomy. No adnexal masses. Other: No free or focal fluid.  No free intraperitoneal air. Musculoskeletal: No acute abnormality. There is some degenerative disc disease in the mid and lower lumbar spine. No focal bony lesion. IMPRESSION: Negative for bowel obstruction. Status post sigmoid colectomy. Large stool ball in the rectosigmoid colon is most consistent with fecal impaction. Atherosclerosis. Mild diverticulosis without diverticulitis. Electronically Signed   By: Inge Rise M.D.   On: 07/26/2017 16:41    Procedures Procedures (including critical care time)  Medications Ordered in ED Medications  sodium chloride 0.9 % bolus 500 mL (0 mLs Intravenous Stopped 07/26/17 1607)  morphine 4 MG/ML injection 4 mg (4 mg Intravenous Given 07/26/17 1535)  ondansetron (ZOFRAN) injection 4 mg (4 mg Intravenous Given 07/26/17 1535)  iopamidol (ISOVUE-300) 61 % injection 100 mL (100 mLs Intravenous Contrast Given 07/26/17  1613)     Initial Impression / Assessment and Plan / ED Course  I have reviewed the triage vital signs and the nursing notes.  Pertinent labs & imaging results that were available during my care of the patient were reviewed by me and considered in my medical decision making (see chart for details).     Patient with history of prior sigmoid ectomy here for evaluation of lower abdominal pain, constipation. CT obtained due to concern for perforation versus obstruction. CT is consistent with vehicle impaction. On rectal examination unable to palpate in impaction. Discussed with patient's recommendation for attempted enema to see if she can get relief of her symptoms. Patient prefers attempting this is an outpatient. Plan to discharge home with fleets enema times once night and she can reattempt tomorrow if she is unsuccessful. Discussed importance of close outpatient follow-up as well as return precautions.  Final Clinical Impressions(s) / ED Diagnoses   Final diagnoses:  Constipation, unspecified constipation type  Fecal impaction [facility])    ED Discharge Orders    None       Quintella Reichert, MD 07/26/17 2337

## 2017-07-26 NOTE — ED Notes (Signed)
Waiting on results of CMP prior to performing CT scan; per radiology protocol

## 2017-07-26 NOTE — ED Notes (Signed)
INFORMED PT AND FAMILY TO BE NPO, until further notice from RN

## 2017-07-26 NOTE — ED Notes (Signed)
Safety measures in place, sr x 2 up, husband at side, pt instructed not to get off stretcher w/o nursing staff at side. Reinforced NPO status until further notice

## 2017-07-26 NOTE — ED Notes (Signed)
ED Provider at bedside. 

## 2017-07-26 NOTE — ED Notes (Signed)
Pt up to restroom, appears comfortable.

## 2017-07-26 NOTE — ED Notes (Signed)
IVF now at [facility]

## 2017-07-26 NOTE — ED Triage Notes (Signed)
Pt reports constipation for the past few days. Pt took laxative without relief. No reports abd pain and nausea.

## 2017-07-26 NOTE — ED Notes (Signed)
Patient transported to CT via stretcher, sr x 2 up  

## 2017-07-26 NOTE — Discharge Instructions (Addendum)
You can use a Fleets enema at home tonight.  If unsuccessful you may repeat tomorrow.  Drink plenty of fluids.  Get rechecked immediately if you develop severe pain, fevers, or new concerning symptoms.

## 2017-08-12 ENCOUNTER — Ambulatory Visit: Payer: Medicare HMO | Admitting: Family

## 2017-08-12 ENCOUNTER — Ambulatory Visit (HOSPITAL_COMMUNITY)
Admission: RE | Admit: 2017-08-12 | Discharge: 2017-08-12 | Disposition: A | Discharge status: Home / Self Care | Payer: Medicare HMO | Source: Ambulatory Visit | Attending: Family | Admitting: Family

## 2017-08-12 ENCOUNTER — Encounter: Payer: Self-pay | Admitting: Family

## 2017-08-12 VITALS — BP 119/73 | HR 75 | Temp 97.4°F | Resp 14 | Ht 62.0 in | Wt 123.8 lb

## 2017-08-12 DIAGNOSIS — G458 Other transient cerebral ischemic attacks and related syndromes: Secondary | ICD-10-CM | POA: Diagnosis not present

## 2017-08-12 DIAGNOSIS — I6501 Occlusion and stenosis of right vertebral artery: Secondary | ICD-10-CM | POA: Diagnosis not present

## 2017-08-12 DIAGNOSIS — I771 Stricture of artery: Secondary | ICD-10-CM

## 2017-08-12 NOTE — Progress Notes (Signed)
Chief Complaint: Follow up Extracranial Carotid Artery Stenosis   History of Present Illness  [name] is a 72 y.o. female whom Dr. Trula Slade initially saw in October 2018 for evaluation of asymmetric blood pressures. The patient initially began having problems in April 2018, she became lightheaded.  She checked her blood pressure and found that it was lower in the left arm measuring 92/68. Her right arm remained normal with systolic pressures in the 120s. She went on to have a duplex ultrasound which showed 1-39 percent bilateral carotid stenosis with irregular blood flow in the vertebral arteries.  She no longer has fatigue in her left arm.  She does have a history of stroke in 2016 secondary to a right vertebral artery occlusion. She had a left eye visual changes with her stroke in October 2016.   She is managed for hypercholesterolemia with a statin. She is a former smoker.   She ended up going for arteriogram on 01/01/2017.  A greater than 80% stenosis was identified in the proximal portion of the left subclavian, proximal to the vertebral artery.  This was treated with primary stenting using 8 x 25 balloon expandable stent.  She has not had any symptoms. has known carotid stenosis.  Dr. Trula Slade last evaluated pt on 02-11-17. At that time no significant carotid stenosis was identified on duplex.  The left vertebral artery was antegrade, the right was retrograde. The patient's symptoms of vertebrobasilar insufficiency had resolved. Continue to monitor her stent with serial ultrasounds.  The next will be in 6 months.  Dr. Trula Slade encouraged her to continue aspirin and Plavix as dual antiplatelet therapy.  She stays physically active.    Diabetic: no Tobacco use: quit in 1978  Pt meds include: Statin : yes; she thinks the statin causes occasional leg pain.  ASA: yes Other anticoagulants/antiplatelets: Plavix   Past Medical History:  Diagnosis Date  . Anxiety   . Barrett  esophagus 2003  . Diverticulitis   . Diverticulosis   . GERD (gastroesophageal reflux disease)   . Glaucoma   . Grave's disease   . H. pylori infection   . Hepatitis A    in her 57 from food  . Hyperlipidemia   . Hypothyroidism   . Increased pressure in the eye   . Insomnia   . Morton's neuroma   . Murmur   . Scoliosis   . Stroke [facility]) 12/2014   no deficits    Social History Social History   Tobacco Use  . Smoking status: Former Smoker    Packs/day: 0.25    Types: Cigarettes    Last attempt to quit: 03/12/1978    Years since quitting: 39.4  . Smokeless tobacco: Never Used  . Tobacco comment: quit in 1978  Substance Use Topics  . Alcohol use: Yes    Alcohol/week: 4.2 oz    Types: 7 Glasses of wine per week  . Drug use: No    Family History Family History  Problem Relation Age of Onset  . Other Father 11       SEPSIS  . Hypertension Father   . Stroke Father 35       CVA  . Heart failure Mother 23  . Heart murmur Mother   . Heart attack Sister 45  . Diabetes Brother   . Breast cancer Sister   . Stroke Sister   . Cirrhosis Brother   . Hypertension Sister        x 2  .  Diabetes Sister   . Heart murmur Daughter     Surgical History Past Surgical History:  Procedure Laterality Date  . AORTIC ARCH ANGIOGRAPHY N/A 01/01/2017   Procedure: AORTIC ARCH ANGIOGRAPHY;  Surgeon: Serafina Mitchell, MD;  Location: [place] CV LAB;  Service: Cardiovascular;  Laterality: N/A;  . APPENDECTOMY  1958  . CATARACT EXTRACTION Left   . CATARACT EXTRACTION W/PHACO Left 07/02/2016   Procedure: CATARACT EXTRACTION PHACO AND INTRAOCULAR LENS PLACEMENT ([place])  Left;  Surgeon: Ronnell Freshwater, MD;  Location: [place];  Service: Ophthalmology;  Laterality: Left;  . CERVICAL LAMINECTOMY  1995  . COLON SURGERY    . EYE SURGERY     due to graves disease  . FEMUR SURGERY Right    benign bone growth  . FOOT NEUROMA SURGERY Right   . INTRAVASCULAR PRESSURE  WIRE/FFR STUDY N/A 04/19/2017   Procedure: INTRAVASCULAR PRESSURE WIRE/FFR STUDY;  Surgeon: Nelva Bush, MD;  Location: [place] CV LAB;  Service: Cardiovascular;  Laterality: N/A;  . LAPAROSCOPIC ASSISTED VAGINAL HYSTERECTOMY    . LAPAROSCOPIC SIGMOID COLECTOMY  05/13/06   Dr Zella Richer  . LEFT HEART CATH AND CORONARY ANGIOGRAPHY N/A 04/19/2017   Procedure: LEFT HEART CATH AND CORONARY ANGIOGRAPHY;  Surgeon: Nelva Bush, MD;  Location: [place] CV LAB;  Service: Cardiovascular;  Laterality: N/A;  . LUMBAR [place] SURGERY  2009  . PERIPHERAL VASCULAR INTERVENTION Left 01/01/2017   Procedure: PERIPHERAL VASCULAR INTERVENTION;  Surgeon: Serafina Mitchell, MD;  Location: [place] CV LAB;  Service: Cardiovascular;  Laterality: Left;  . SUBCLAVIAN STENT PLACEMENT    . TONSILLECTOMY  1992  . TUBAL LIGATION    . UPPER EXTREMITY ANGIOGRAPHY Left 01/01/2017   Procedure: Upper Extremity Angiography;  Surgeon: Serafina Mitchell, MD;  Location: [place] CV LAB;  Service: Cardiovascular;  Laterality: Left;    Allergies  Allergen Reactions  . Neuromuscular Blocking Agents Other (See Comments)    Multiple symptoms, SOB, fatigue, weakness  . Vicodin [Hydrocodone-Acetaminophen] Other (See Comments)    Altered mental state    Current Outpatient Medications  Medication Sig Dispense Refill  . acetaminophen (TYLENOL) 500 MG tablet Take 500 mg by mouth daily as needed for moderate pain or headache.     Marland Kitchen aspirin EC 81 MG tablet Take 81 mg by mouth daily.    . clopidogrel (PLAVIX) 75 MG tablet Take 1 tablet (75 mg total) by mouth daily. 90 tablet 2  . diazepam (VALIUM) 5 MG tablet Take 5 mg by mouth at bedtime as needed for anxiety (sleep).     . diphenhydrAMINE (BENADRYL) 25 MG tablet Take 25 mg by mouth at bedtime as needed for sleep.    . dorzolamide-timolol (COSOPT) 22.3-6.8 MG/ML ophthalmic solution Place into both eyes daily.    Marland Kitchen latanoprost (XALATAN) 0.005 % ophthalmic solution Place 1  drop into both eyes at bedtime.    Marland Kitchen levothyroxine (SYNTHROID, LEVOTHROID) 88 MCG tablet Take 88 mcg by mouth daily.  4  . pantoprazole (PROTONIX) 40 MG tablet Take 40 mg by mouth daily as needed (indigestion).    . isosorbide mononitrate (IMDUR) 30 MG 24 hr tablet Take 1 tablet (30 mg total) by mouth daily. (Patient taking differently: Take 15 mg by mouth daily. ) 90 tablet 3  . rosuvastatin (CRESTOR) 20 MG tablet Take 1 tablet (20 mg total) by mouth daily. 90 tablet 3   No current facility-administered medications for this visit.     Review of Systems : See  HPI for pertinent positives and negatives.  Physical Examination  Vitals:   08/12/17 1325 08/12/17 1327  BP: 117/76 119/73  Pulse: 75   Resp: 14   Temp: (!) 97.4 F (36.3 C)   TempSrc: Oral   SpO2: 98%   Weight: 123 lb 12.8 oz (56.2 kg)   Height: 5\' 2"  (1.575 m)    Body mass index is 22.64 kg/m.  General: WDWN female in NAD GAIT: normal Eyes: PERRLA HENT: No gross abnormalities.  Pulmonary:  Respirations are non-labored, good air movement, CTAB, no rales, rhonchi, or wheezing. Cardiac: regular rhythm, no detected murmur.  VASCULAR EXAM Carotid Bruits Right Left   Negative Negative     Abdominal aortic pulse is not palpable. Radial pulses are 2+ palpable and equal.                                                                                                                            LE Pulses Right Left       POPLITEAL  not palpable   not palpable       POSTERIOR TIBIAL  2+ palpable   not palpable        DORSALIS PEDIS      ANTERIOR TIBIAL not palpable  2+ palpable     Gastrointestinal: soft, nontender, BS WNL, no r/g, no palpable masses. Musculoskeletal: no muscle atrophy/wasting. M/S 5/5 throughout, extremities without ischemic changes. Skin: No rashes, no ulcers, no cellulitis.   Neurologic:  A&O X 3; appropriate affect, sensation is normal; speech is normal, CN 2-12 intact, pain and light touch  intact in extremities, motor exam as listed above. Psychiatric: Normal thought content, mood appropriate to clinical situation.    Assessment: DELETHA JAFFEE is a 72 y.o. female who is s/p primary stenting of the proximal portion of the left subclavian, proximal to the vertebral artery, using 8 x 25 balloon expandable stent on 01-01-17 by Dr. Trula Slade.  She has no further dizziness, left arm fatigue has resolved, no subsequent neurological events.    DATA Carotid Duplex (08/12/17): 1-39% bilateral ICA stenosis. Right vertebral artery occluded, left flow is antegrade. Bilateral subclavian artery waveforms are normal.  No change compared to the exam on 02-11-17 except that the right vertebral artery is now occluded, was retrograde.    Plan: Follow-up in 6 months with Carotid Duplex scan; if stable at that time can extend follow up to 1 year.    I discussed in depth with the patient the nature of atherosclerosis, and emphasized the importance of maximal medical management including strict control of blood pressure, blood glucose, and lipid levels, obtaining regular exercise, and continued cessation of smoking.  The patient is aware that without maximal medical management the underlying atherosclerotic disease process will progress, limiting the benefit of any interventions. The patient was given information about stroke prevention and what symptoms should prompt the patient to seek immediate medical care. Thank you for allowing [place] to participate in this  patient's care.  Clemon Chambers, RN, MSN, FNP-C [facility] Office: [facility] Physician: Trula Slade  08/12/17 1:48 PM

## 2017-08-12 NOTE — Patient Instructions (Signed)

## 2017-08-13 DIAGNOSIS — R69 Illness, unspecified: Secondary | ICD-10-CM | POA: Diagnosis not present

## 2017-08-23 ENCOUNTER — Other Ambulatory Visit: Payer: Self-pay

## 2017-08-23 DIAGNOSIS — I6300 Cerebral infarction due to thrombosis of unspecified precerebral artery: Secondary | ICD-10-CM

## 2017-09-03 DIAGNOSIS — H401132 Primary open-angle glaucoma, bilateral, moderate stage: Secondary | ICD-10-CM | POA: Diagnosis not present

## 2017-09-16 DIAGNOSIS — E890 Postprocedural hypothyroidism: Secondary | ICD-10-CM | POA: Diagnosis not present

## 2017-09-17 DIAGNOSIS — R69 Illness, unspecified: Secondary | ICD-10-CM | POA: Diagnosis not present

## 2017-09-23 ENCOUNTER — Encounter (HOSPITAL_COMMUNITY): Payer: Self-pay | Admitting: Emergency Medicine

## 2017-09-23 ENCOUNTER — Emergency Department (HOSPITAL_COMMUNITY): Payer: Medicare HMO

## 2017-09-23 ENCOUNTER — Other Ambulatory Visit: Payer: Self-pay

## 2017-09-23 ENCOUNTER — Emergency Department (HOSPITAL_COMMUNITY)
Admission: EM | Admit: 2017-09-23 | Discharge: 2017-09-23 | Discharge status: Against Medical Advice | Payer: Medicare HMO | Attending: Emergency Medicine | Admitting: Emergency Medicine

## 2017-09-23 DIAGNOSIS — J984 Other disorders of lung: Secondary | ICD-10-CM | POA: Diagnosis not present

## 2017-09-23 DIAGNOSIS — R932 Abnormal findings on diagnostic imaging of liver and biliary tract: Secondary | ICD-10-CM | POA: Diagnosis not present

## 2017-09-23 DIAGNOSIS — I77810 Thoracic aortic ectasia: Secondary | ICD-10-CM | POA: Diagnosis not present

## 2017-09-23 DIAGNOSIS — R0789 Other chest pain: Secondary | ICD-10-CM | POA: Diagnosis not present

## 2017-09-23 DIAGNOSIS — I700 Atherosclerosis of aorta: Secondary | ICD-10-CM | POA: Diagnosis not present

## 2017-09-23 DIAGNOSIS — E785 Hyperlipidemia, unspecified: Secondary | ICD-10-CM | POA: Diagnosis not present

## 2017-09-23 DIAGNOSIS — Z79899 Other long term (current) drug therapy: Secondary | ICD-10-CM | POA: Diagnosis not present

## 2017-09-23 DIAGNOSIS — Z7982 Long term (current) use of aspirin: Secondary | ICD-10-CM | POA: Diagnosis not present

## 2017-09-23 DIAGNOSIS — R079 Chest pain, unspecified: Secondary | ICD-10-CM | POA: Diagnosis not present

## 2017-09-23 DIAGNOSIS — Z8673 Personal history of transient ischemic attack (TIA), and cerebral infarction without residual deficits: Secondary | ICD-10-CM | POA: Diagnosis not present

## 2017-09-23 DIAGNOSIS — R1013 Epigastric pain: Secondary | ICD-10-CM

## 2017-09-23 DIAGNOSIS — K2900 Acute gastritis without bleeding: Secondary | ICD-10-CM | POA: Diagnosis not present

## 2017-09-23 DIAGNOSIS — Z7902 Long term (current) use of antithrombotics/antiplatelets: Secondary | ICD-10-CM | POA: Diagnosis not present

## 2017-09-23 DIAGNOSIS — R1012 Left upper quadrant pain: Secondary | ICD-10-CM | POA: Diagnosis not present

## 2017-09-23 LAB — COMPREHENSIVE METABOLIC PANEL
ALK PHOS: 82 U/L (ref 38–126)
ALT: 23 U/L (ref 0–44)
ANION GAP: 8 (ref 5–15)
AST: 29 U/L (ref 15–41)
Albumin: 4.3 g/dL (ref 3.5–5.0)
BILIRUBIN TOTAL: 0.6 mg/dL (ref 0.3–1.2)
BUN: 11 mg/dL (ref 8–23)
CALCIUM: 9.3 mg/dL (ref 8.9–10.3)
CO2: 24 mmol/L (ref 22–32)
CREATININE: 0.82 mg/dL (ref 0.44–1.00)
Chloride: 104 mmol/L (ref 98–111)
Glucose, Bld: 99 mg/dL (ref 70–99)
Potassium: 4.8 mmol/L (ref 3.5–5.1)
SODIUM: 136 mmol/L (ref 135–145)
TOTAL PROTEIN: 6.8 g/dL (ref 6.5–8.1)

## 2017-09-23 LAB — CBC
HEMATOCRIT: 44.5 % (ref 36.0–46.0)
Hemoglobin: 14.2 g/dL (ref 12.0–15.0)
MCH: 30 pg (ref 26.0–34.0)
MCHC: 31.9 g/dL (ref 30.0–36.0)
MCV: 94.1 fL (ref 78.0–100.0)
Platelets: 227 10*3/uL (ref 150–400)
RBC: 4.73 MIL/uL (ref 3.87–5.11)
RDW: 12 % (ref 11.5–15.5)
WBC: 5.7 10*3/uL (ref 4.0–10.5)

## 2017-09-23 LAB — I-STAT TROPONIN, ED: Troponin i, poc: 0 ng/mL (ref 0.00–0.08)

## 2017-09-23 LAB — TROPONIN I: Troponin I: <0.03 ng/mL (ref <0.03)

## 2017-09-23 LAB — LIPASE, BLOOD: LIPASE: 47 U/L (ref 11–51)

## 2017-09-23 NOTE — ED Triage Notes (Signed)
Patient complains of sudden onset of epigastric pressure that radiated to back earlier today while seated. Rates pain 2/10 right now, states pain was 8/10 at its worst. Alert, oriented, and in no apparent distress at this time.

## 2017-09-23 NOTE — ED Notes (Signed)
Family member spoke with nurse first regarding leaving. Nurse spoke with family member and patient. States they will leave to go see a family member who is also a provider.

## 2017-09-23 NOTE — ED Provider Notes (Signed)
Patient placed in Quick Look pathway, seen and evaluated   Chief Complaint: Epigastric pain  HPI:   72 year old female with a history of sudden onset epigastric pain that radiates to her mid back that began earlier today.  Pain is currently a 2 out of 10 was previously an 8 out of 10.  No known aggravating or alleviating factors.   ROS: Abdominal pain (one)  Physical Exam:   Gen: No distress  Neuro: Awake and Alert  Skin: Warm    Focused Exam: TTP in the epigastric and LUQ. No rebound or guarding.   Initiation of care has begun. The patient has been counseled on the process, plan, and necessity for staying for the completion/evaluation, and the remainder of the medical screening examination    Joanne Gavel, PA-C 09/23/17 1725    Drenda Freeze, MD 10/01/17 1304

## 2017-10-10 ENCOUNTER — Ambulatory Visit: Payer: Medicare HMO | Admitting: Neurology

## 2017-10-14 ENCOUNTER — Other Ambulatory Visit: Payer: Self-pay | Admitting: Adult Health

## 2017-10-14 DIAGNOSIS — Z1231 Encounter for screening mammogram for malignant neoplasm of breast: Secondary | ICD-10-CM

## 2017-10-22 ENCOUNTER — Ambulatory Visit: Payer: Medicare HMO | Admitting: Neurology

## 2017-10-22 ENCOUNTER — Encounter: Payer: Self-pay | Admitting: Neurology

## 2017-10-22 VITALS — BP 127/58 | HR 70 | Wt 121.6 lb

## 2017-10-22 DIAGNOSIS — I6990 Unspecified sequelae of unspecified cerebrovascular disease: Secondary | ICD-10-CM | POA: Diagnosis not present

## 2017-10-22 NOTE — Patient Instructions (Addendum)
I had a long d/w patient about her remote stroke, risk for recurrent stroke/TIAs, personally independently reviewed imaging studies and stroke evaluation results and answered questions.Continue  Plavix 75 mg and aspirin 81 mg for secondary stroke prevention for now given her coronary artery disease but I advised her to discuss with her cardiologist whether she can stop aspirin since she is having some gastritis symptoms. Also And maintain strict control of hypertension with blood pressure goal below 130/90, diabetes with hemoglobin A1c goal below 6.5% and lipids with LDL cholesterol goal below 70 mg/dL. I also advised the patient to eat a healthy diet with plenty of whole grains, cereals, fruits and vegetables, exercise regularly and maintain ideal body weight No scheduled follow-up with me is necessary but she may be referred back as needed.

## 2017-10-23 NOTE — Progress Notes (Signed)
[facility] [address] [place]. [place] [address] [phone]       OFFICE FOLLOW-UP NOTE  Jamie. [name] Date of Birth:  1946-02-04 Medical Record Number:  [id number]   HPI: [name] is a 72 year old Caucasian lady seen today for the first office follow-up visit following admission to [facility] in October 2016 for stroke.[name] is an 72 y.o. female who reports that she went to bed normal on Wednesday night, 12/22/2014 at 2200 (LKW). When she awakened on Thursday she noted right sided neck pain and pain behind her right eye. She then noted that her peripheral vision was restricted to the left. She thought this was secondary to her sinuses but with no improvement in her symptoms over the past few days she presented for evaluation. She reports that her symptoms are somewhat positional and worse when she is sitting up. She currently has no headache. Her symptoms have not restricted her activities and she has continued to drive and work. NIHSS of 0. MRankin: 0. Patient was not administered TPA secondary to delay in arrival. She was admitted for further evaluation and treatment. CT scan of the head on admission showed no acute abnormality and only mild changes of chronic microvascular ischemia. I personally reviewed all brain imaging studies of this admission. 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 There was high-grade stenosis noted in the left superior cerebellar artery as well. CT angiogram of the neck and brain were subsequently obtained which confirmed these findings. LDL cholesterol was elevated 138.  Transthoracic echo showed normal ejection fraction without cardiac source of embolism.  She states she's done well since discharge without recurrent stroke or TIA symptoms. She however did not tolerate Lipitor due to itching and has  been changed to Crestor which she seemed to be tolerating better. She remains on aspirin and Plavix which is tolerating well with only minor bruising but no bleeding episodes. She has kept her outpatient follow-up visit for the stroke A. fib trial in which she was randomized to the medical arm without loop recorder implant. Update 09/29/2015 : She returns for follow-up after last visit 6 months ago. She continues to do well without any recurrent stroke or TIA symptoms. She states her blood pressure is well controlled and today it is 104/66. She remains on Plavix and does get minor bruising but no bleeding. She remains on Crestor but complains about joint aches and muscle aches. She had lipid profile checked a month ago it was satisfactory. She has not tried taking coenzyme Q 10. She has no new complaints today. She continues to participate in the stroke atrial fibrillation trial Update 10/09/16 : She returns for follow-up after last visit a year ago. She continues to do well without recurrent stroke or TIA symptoms now for more than a year and a half. She is tolerating   Plavix without significant bruising or bleeding but is wondering if she can come off the Plavix and switched to aspirin due to cost factor. She is had some low blood pressure for last several months and occasionally feels lightheaded. She also complained of some intermittent palpitations and when she saw her cardiologist external heart monitor was suggested but not ordered. Patient wants my opinion whether she can get this done since his participation in the stroke activated coagulation time. She still complains of joint aches and muscle aches and fatigue  but admits that she has not been regularly taking CoQ10. She remains on Crestor since her stroke in October 2016. She has no other complaints. She has not had any follow-up carotid   doppler studies done since her stroke Update 10/22/2017 : She returns for follow-up after last visit a year ago. She  continues to do well without recurrent stroke or TIA symptoms. She had follow-up carotid ultrasound done on 08/12/17 which showed chronic right vertebral artery occlusion without significant carotid stenosis. She had lipid profile checked on 07/09/17 which showed LDL cholesterol of 65 mg percent. Patient had some chest discomfort which she describes as gastritis. She saw a cardiologist and has been taking aspirin and Plavix. She had a cardiac cath done in February 2019 which showed only mild occlusive disease and she was not felt to be candidate for cardiac intervention. She states her blood pressure is under good control today is 127/58. Patient is nearing end of completion the stroke A. fib trial and has a final visit scheduled for next month. She has no new complaints today. ROS:   14 system review of systems is positive for  ,  stomach pain, gastritis fatigue,  aching muscles only and all other systems negative PMH:  Past Medical History:  Diagnosis Date  . Anxiety   . Barrett esophagus 2003  . Diverticulitis   . Diverticulosis   . GERD (gastroesophageal reflux disease)   . Glaucoma   . Grave's disease   . H. pylori infection   . Hepatitis A    in her 48 from food  . Hyperlipidemia   . Hypothyroidism   . Increased pressure in the eye   . Insomnia   . Morton's neuroma   . Murmur   . Scoliosis   . Stroke [facility]) 12/2014   no deficits    Social History:  Social History   Socioeconomic History  . Marital status: Married    Spouse name: Not on file  . Number of children: 1  . Years of education: Not on file  . Highest education level: Not on file  Occupational History  . Occupation: Merchant navy officer    Comment: @ [employer] [place]  . Financial resource strain: Not on file  . Food insecurity:    Worry: Not on file    Inability: Not on file  . Transportation needs:    Medical: Not on file    Non-medical: Not on file  Tobacco Use  . Smoking status:  Former Smoker    Packs/day: 0.25    Types: Cigarettes    Last attempt to quit: 03/12/1978    Years since quitting: 39.6  . Smokeless tobacco: Never Used  . Tobacco comment: quit in 1978  Substance and Sexual Activity  . Alcohol use: Yes    Alcohol/week: 7.0 standard drinks    Types: 7 Glasses of wine per week  . Drug use: No  . Sexual activity: Not Currently    Birth control/protection: None  Lifestyle  . Physical activity:    Days per week: Not on file    Minutes per session: Not on file  . Stress: Not on file  Relationships  . Social connections:    Talks on phone: Not on file    Gets together: Not on file    Attends religious service: Not on file    Active member of club or organization: Not on file    Attends meetings of clubs or organizations:  Not on file    Relationship status: Not on file  . Intimate partner violence:    Fear of current or ex partner: Not on file    Emotionally abused: Not on file    Physically abused: Not on file    Forced sexual activity: Not on file  Other Topics Concern  . Not on file  Social History Narrative  . Not on file    Medications:   Current Outpatient Medications on File Prior to Visit  Medication Sig Dispense Refill  . acetaminophen (TYLENOL) 500 MG tablet Take 500 mg by mouth daily as needed for moderate pain or headache.     Marland Kitchen aspirin EC 81 MG tablet Take 81 mg by mouth daily.    . clopidogrel (PLAVIX) 75 MG tablet Take 1 tablet (75 mg total) by mouth daily. 90 tablet 2  . diazepam (VALIUM) 5 MG tablet Take 5 mg by mouth at bedtime as needed for anxiety (sleep).     . diphenhydrAMINE (BENADRYL) 25 MG tablet Take 25 mg by mouth at bedtime as needed for sleep.    . dorzolamide-timolol (COSOPT) 22.3-6.8 MG/ML ophthalmic solution Place into both eyes daily.    . isosorbide mononitrate (IMDUR) 30 MG 24 hr tablet Take 1 tablet (30 mg total) by mouth daily. (Patient taking differently: Take 15 mg by mouth daily. ) 90 tablet 3  .  latanoprost (XALATAN) 0.005 % ophthalmic solution Place 1 drop into both eyes at bedtime.    Marland Kitchen levothyroxine (SYNTHROID, LEVOTHROID) 88 MCG tablet Take 88 mcg by mouth daily.  4  . pantoprazole (PROTONIX) 40 MG tablet Take 40 mg by mouth daily as needed (indigestion).    . rosuvastatin (CRESTOR) 20 MG tablet Take 1 tablet (20 mg total) by mouth daily. 90 tablet 3   No current facility-administered medications on file prior to visit.     Allergies:   Allergies  Allergen Reactions  . Neuromuscular Blocking Agents Other (See Comments)    Multiple symptoms, SOB, fatigue, weakness  . Vicodin [Hydrocodone-Acetaminophen] Other (See Comments)    Altered mental state    Physical Exam General: frail petite pleasant elderly Caucasian lady, seated, in no evident distress Head: head normocephalic and atraumatic.  Neck: supple with soft left carotid   bruit Cardiovascular: regular rate and rhythm, no murmurs Musculoskeletal: no deformity Skin:  no rash/petichiae Vascular:  Normal pulses all extremities Vitals:   10/22/17 1049  BP: (!) 127/58  Pulse: 70   Neurologic Exam Mental Status: Awake and fully alert. Oriented to place and time. Recent and remote memory intact. Attention span, concentration and fund of knowledge appropriate. Mood and affect appropriate.  Cranial Nerves: Fundoscopic exam not done. Pupils equal, briskly reactive to light. Extraocular movements full without nystagmus. Visual fields full to confrontation. Hearing intact. Facial sensation intact. Face, tongue, palate moves normally and symmetrically.  Motor: Normal bulk and tone. Normal strength in all tested extremity muscles. Sensory.: intact to touch ,pinprick .position and vibratory sensation.  Coordination: Rapid alternating movements normal in all extremities. Finger-to-nose and heel-to-shin performed accurately bilaterally. Gait and Station: Arises from chair without difficulty. Stance is normal. Gait demonstrates  normal stride length and balance . Able to heel, toe and tandem walk without difficulty.  Reflexes: 1+ and symmetric. Toes downgoing.      ASSESSMENT: 27 year Caucasian lady with right occipital infarcts in October 2016 secondary to right vertebral artery occlusion. Vascular risk factors of hyperlipidemia, cerebrovascular disease. Patient is particpating in the stroke atrial  fibrillation trial and randomized to the standard of care arm ( no loop recorder implant)    PLAN: I had a long d/w patient about her remote stroke, risk for recurrent stroke/TIAs, personally independently reviewed imaging studies and stroke evaluation results and answered questions.Continue  Plavix 75 mg and aspirin 81 mg for secondary stroke prevention for now given her coronary artery disease but I advised her to discuss with her cardiologist whether she can stop aspirin since she is having some gastritis symptoms. Also And maintain strict control of hypertension with blood pressure goal below 130/90, diabetes with hemoglobin A1c goal below 6.5% and lipids with LDL cholesterol goal below 70 mg/dL. I also advised the patient to eat a healthy diet with plenty of whole grains, cereals, fruits and vegetables, exercise regularly and maintain ideal body weight No scheduled follow-up with me is necessary but she may be referred back as needed.Greater than 50% time during this 25 minute visit was spent on counseling and coordination of care about   stroke risk and prevention   Antony Contras, MD Note: This document was prepared with digital dictation and possible smart phrase technology. Any transcriptional errors that result from this process are unintentional

## 2017-11-26 DIAGNOSIS — R69 Illness, unspecified: Secondary | ICD-10-CM | POA: Diagnosis not present

## 2017-11-29 ENCOUNTER — Telehealth: Payer: Self-pay | Admitting: *Deleted

## 2017-11-29 NOTE — Telephone Encounter (Signed)
Spoke with husband patient Is doing well he will have her call research office for last STROKE~AF Research study telephone follow up.

## 2017-12-02 ENCOUNTER — Encounter: Payer: Self-pay | Admitting: *Deleted

## 2017-12-02 NOTE — Research (Unsigned)
STROKE~AF Research study month 36 telephone follow up visit completed. Patient states she stopped her ASA on 10/22/17 after her Neurology visit with Dr. Leonie Man. No Adverse events reported. I thanked her for her participation in the research study.

## 2017-12-09 ENCOUNTER — Ambulatory Visit
Admission: RE | Admit: 2017-12-09 | Discharge: 2017-12-09 | Disposition: A | Discharge status: Home / Self Care | Payer: Medicare HMO | Source: Ambulatory Visit | Attending: Adult Health | Admitting: Adult Health

## 2017-12-09 ENCOUNTER — Other Ambulatory Visit: Payer: Self-pay | Admitting: Adult Health

## 2017-12-09 DIAGNOSIS — Z1231 Encounter for screening mammogram for malignant neoplasm of breast: Secondary | ICD-10-CM

## 2018-01-01 DIAGNOSIS — M65321 Trigger finger, right index finger: Secondary | ICD-10-CM | POA: Insufficient documentation

## 2018-01-07 DIAGNOSIS — H401132 Primary open-angle glaucoma, bilateral, moderate stage: Secondary | ICD-10-CM | POA: Diagnosis not present

## 2018-01-24 DIAGNOSIS — Z0100 Encounter for examination of eyes and vision without abnormal findings: Secondary | ICD-10-CM | POA: Diagnosis not present

## 2018-01-24 DIAGNOSIS — H353131 Nonexudative age-related macular degeneration, bilateral, early dry stage: Secondary | ICD-10-CM | POA: Diagnosis not present

## 2018-01-28 DIAGNOSIS — H2511 Age-related nuclear cataract, right eye: Secondary | ICD-10-CM | POA: Diagnosis not present

## 2018-01-28 DIAGNOSIS — Z0100 Encounter for examination of eyes and vision without abnormal findings: Secondary | ICD-10-CM | POA: Diagnosis not present

## 2018-02-04 ENCOUNTER — Other Ambulatory Visit: Payer: Self-pay

## 2018-02-04 ENCOUNTER — Encounter: Payer: Self-pay | Admitting: *Deleted

## 2018-02-04 NOTE — Discharge Instructions (Signed)

## 2018-02-12 ENCOUNTER — Ambulatory Visit
Admission: RE | Admit: 2018-02-12 | Discharge: 2018-02-12 | Disposition: A | Discharge status: Home / Self Care | Payer: Medicare HMO | Source: Ambulatory Visit | Attending: Ophthalmology | Admitting: Ophthalmology

## 2018-02-12 ENCOUNTER — Ambulatory Visit: Payer: Medicare HMO | Admitting: Anesthesiology

## 2018-02-12 ENCOUNTER — Encounter
Admission: RE | Disposition: A | Discharge status: Home / Self Care | Payer: Self-pay | Source: Ambulatory Visit | Attending: Ophthalmology

## 2018-02-12 DIAGNOSIS — E7800 Pure hypercholesterolemia, unspecified: Secondary | ICD-10-CM | POA: Diagnosis not present

## 2018-02-12 DIAGNOSIS — Z79899 Other long term (current) drug therapy: Secondary | ICD-10-CM | POA: Insufficient documentation

## 2018-02-12 DIAGNOSIS — Z7982 Long term (current) use of aspirin: Secondary | ICD-10-CM | POA: Insufficient documentation

## 2018-02-12 DIAGNOSIS — H25811 Combined forms of age-related cataract, right eye: Secondary | ICD-10-CM | POA: Diagnosis not present

## 2018-02-12 DIAGNOSIS — K219 Gastro-esophageal reflux disease without esophagitis: Secondary | ICD-10-CM | POA: Diagnosis not present

## 2018-02-12 DIAGNOSIS — H2511 Age-related nuclear cataract, right eye: Secondary | ICD-10-CM | POA: Insufficient documentation

## 2018-02-12 DIAGNOSIS — E039 Hypothyroidism, unspecified: Secondary | ICD-10-CM | POA: Insufficient documentation

## 2018-02-12 DIAGNOSIS — Z0100 Encounter for examination of eyes and vision without abnormal findings: Secondary | ICD-10-CM | POA: Diagnosis not present

## 2018-02-12 DIAGNOSIS — I2510 Atherosclerotic heart disease of native coronary artery without angina pectoris: Secondary | ICD-10-CM | POA: Diagnosis not present

## 2018-02-12 DIAGNOSIS — Z87891 Personal history of nicotine dependence: Secondary | ICD-10-CM | POA: Diagnosis not present

## 2018-02-12 HISTORY — PX: CATARACT EXTRACTION W/PHACO: SHX586

## 2018-02-12 HISTORY — PX: CATARACT EXTRACTION: SUR2

## 2018-02-12 SURGERY — PHACOEMULSIFICATION, CATARACT, WITH IOL INSERTION
Anesthesia: Monitor Anesthesia Care | Site: Eye | Laterality: Right

## 2018-02-12 MED ORDER — OXYCODONE HCL 5 MG PO TABS: 5.0000 mg | ORAL_TABLET | Freq: Once | ORAL | Status: DC | PRN

## 2018-02-12 MED ORDER — CEFUROXIME OPHTHALMIC INJECTION 1 MG/0.1 ML
INJECTION | OPHTHALMIC | Status: DC | PRN
  Administered 2018-02-12: 0.1 mL via INTRACAMERAL

## 2018-02-12 MED ORDER — TETRACAINE HCL 0.5 % OP SOLN
1.0000 [drp] | OPHTHALMIC | Status: DC | PRN
  Administered 2018-02-12 (×2): 1 [drp] via OPHTHALMIC

## 2018-02-12 MED ORDER — OXYCODONE HCL 5 MG/5ML PO SOLN: 5.0000 mg | Freq: Once | ORAL | Status: DC | PRN

## 2018-02-12 MED ORDER — LIDOCAINE HCL (PF) 2 % IJ SOLN
INTRAOCULAR | Status: DC | PRN
  Administered 2018-02-12: 2 mL

## 2018-02-12 MED ORDER — NA HYALUR & NA CHOND-NA HYALUR 0.4-0.35 ML IO KIT
PACK | INTRAOCULAR | Status: DC | PRN
  Administered 2018-02-12: 1 mL via INTRAOCULAR

## 2018-02-12 MED ORDER — BRIMONIDINE TARTRATE-TIMOLOL 0.2-0.5 % OP SOLN
OPHTHALMIC | Status: DC | PRN
  Administered 2018-02-12: 1 [drp] via OPHTHALMIC

## 2018-02-12 MED ORDER — ARMC OPHTHALMIC DILATING DROPS
1.0000 "application " | OPHTHALMIC | Status: DC | PRN
  Administered 2018-02-12 (×3): 1 via OPHTHALMIC

## 2018-02-12 MED ORDER — EPINEPHRINE PF 1 MG/ML IJ SOLN
INTRAOCULAR | Status: DC | PRN
  Administered 2018-02-12: 55 mL via OPHTHALMIC

## 2018-02-12 MED ORDER — FENTANYL CITRATE (PF) 100 MCG/2ML IJ SOLN
INTRAMUSCULAR | Status: DC | PRN
  Administered 2018-02-12: 50 ug via INTRAVENOUS

## 2018-02-12 MED ORDER — MIDAZOLAM HCL 2 MG/2ML IJ SOLN
INTRAMUSCULAR | Status: DC | PRN
  Administered 2018-02-12 (×2): 1 mg via INTRAVENOUS

## 2018-02-12 MED ORDER — MOXIFLOXACIN HCL 0.5 % OP SOLN
1.0000 [drp] | OPHTHALMIC | Status: DC | PRN
  Administered 2018-02-12 (×3): 1 [drp] via OPHTHALMIC

## 2018-02-12 SURGICAL SUPPLY — 21 items
CANNULA ANT/CHMB 27G (MISCELLANEOUS) ×1 IMPLANT
CANNULA ANT/CHMB 27GA (MISCELLANEOUS) ×2 IMPLANT
GLOVE SURG LX 7.5 STRW (GLOVE) ×1
GLOVE SURG LX STRL 7.5 STRW (GLOVE) ×1 IMPLANT
GLOVE SURG TRIUMPH 8.0 PF LTX (GLOVE) ×2 IMPLANT
GOWN STRL REUS W/ TWL LRG LVL3 (GOWN DISPOSABLE) ×2 IMPLANT
GOWN STRL REUS W/TWL LRG LVL3 (GOWN DISPOSABLE) ×4
LENS IOL ACRSF IQ ULTRA 19.0 (Intraocular Lens) IMPLANT
LENS IOL ACRYSOF IQ 19.0 (Intraocular Lens) ×2 IMPLANT
MARKER SKIN DUAL TIP RULER LAB (MISCELLANEOUS) ×2 IMPLANT
NDL FILTER BLUNT 18X1 1/2 (NEEDLE) ×1 IMPLANT
NEEDLE FILTER BLUNT 18X 1/2SAF (NEEDLE) ×1
NEEDLE FILTER BLUNT 18X1 1/2 (NEEDLE) ×1 IMPLANT
PACK CATARACT BRASINGTON (MISCELLANEOUS) ×2 IMPLANT
PACK EYE AFTER SURG (MISCELLANEOUS) ×2 IMPLANT
PACK OPTHALMIC (MISCELLANEOUS) ×2 IMPLANT
SYR 3ML LL SCALE MARK (SYRINGE) ×2 IMPLANT
SYR 5ML LL (SYRINGE) ×2 IMPLANT
SYR TB 1ML LUER SLIP (SYRINGE) ×2 IMPLANT
WATER STERILE IRR 500ML POUR (IV SOLUTION) ×2 IMPLANT
WIPE NON LINTING 3.25X3.25 (MISCELLANEOUS) ×2 IMPLANT

## 2018-02-12 NOTE — Transfer of Care (Signed)
Immediate Anesthesia Transfer of Care Note  Patient: [name]  Procedure(s) Performed: CATARACT EXTRACTION PHACO AND INTRAOCULAR LENS PLACEMENT (IOC)  RIGHT (Right Eye)  Patient Location: PACU  Anesthesia Type: MAC  Level of Consciousness: awake, alert  and patient cooperative  Airway and Oxygen Therapy: Patient Spontanous Breathing and Patient connected to supplemental oxygen  Post-op Assessment: Post-op Vital signs reviewed, Patient's Cardiovascular Status Stable, Respiratory Function Stable, Patent Airway and No signs of Nausea or vomiting  Post-op Vital Signs: Reviewed and stable  Complications: No apparent anesthesia complications

## 2018-02-12 NOTE — Anesthesia Postprocedure Evaluation (Signed)
Anesthesia Post Note  Patient: [name]  Procedure(s) Performed: CATARACT EXTRACTION PHACO AND INTRAOCULAR LENS PLACEMENT (IOC)  RIGHT (Right Eye)  Patient location during evaluation: PACU Anesthesia Type: MAC Level of consciousness: awake and alert Pain management: pain level controlled Vital Signs Assessment: post-procedure vital signs reviewed and stable Respiratory status: spontaneous breathing Cardiovascular status: blood pressure returned to baseline and stable Anesthetic complications: no    Jaci Standard, III,  Deaaron Fulghum D

## 2018-02-12 NOTE — Anesthesia Preprocedure Evaluation (Signed)
Anesthesia Evaluation  Patient identified by MRN, date of birth, ID band Patient awake    Reviewed: Allergy & Precautions, H&P , NPO status , Patient's Chart, lab work & pertinent test results  History of Anesthesia Complications Negative for: history of anesthetic complications  Airway Mallampati: I  TM Distance: >3 FB Neck ROM: full    Dental no notable dental hx.    Pulmonary former smoker,    Pulmonary exam normal breath sounds clear to auscultation       Cardiovascular + angina + CAD  Normal cardiovascular exam+ Valvular Problems/Murmurs  Rhythm:regular Rate:Normal     Neuro/Psych  Neuromuscular disease CVA    GI/Hepatic Medicated,(+) A  Endo/Other  Hypothyroidism   Renal/GU negative Renal ROS     Musculoskeletal   Abdominal   Peds  Hematology negative hematology ROS (+)   Anesthesia Other Findings   Reproductive/Obstetrics                             Anesthesia Physical Anesthesia Plan  ASA: III  Anesthesia Plan: MAC   Post-op Pain Management:    Induction:   PONV Risk Score and Plan:   Airway Management Planned:   Additional Equipment:   Intra-op Plan:   Post-operative Plan:   Informed Consent: I have reviewed the patients History and Physical, chart, labs and discussed the procedure including the risks, benefits and alternatives for the proposed anesthesia with the patient or authorized representative who has indicated his/her understanding and acceptance.     Plan Discussed with:   Anesthesia Plan Comments:         Anesthesia Quick Evaluation

## 2018-02-12 NOTE — H&P (Signed)

## 2018-02-12 NOTE — Op Note (Signed)
LOCATION:  [facility]   PREOPERATIVE DIAGNOSIS:    Nuclear sclerotic cataract right eye. H25.11   POSTOPERATIVE DIAGNOSIS:  Nuclear sclerotic cataract right eye.     PROCEDURE:  Phacoemusification with posterior chamber intraocular lens placement of the right eye   LENS:   Implant Name Type Inv. Item Serial No. Manufacturer Lot No. LRB No. Used  LENS IOL ACRYSOF IQ 19.0 - [id number] Intraocular Lens LENS IOL ACRYSOF IQ 19.0 [id number] ALCON  Right 1        ULTRASOUND TIME: 16 % of 0 minutes, 53 seconds.  CDE 8.3   SURGEON:  Wyonia Hough, MD   ANESTHESIA:  Topical with tetracaine drops and 2% Xylocaine jelly, augmented with 1% preservative-free intracameral lidocaine.    COMPLICATIONS:  None.   DESCRIPTION OF PROCEDURE:  The patient was identified in the holding room and transported to the operating room and placed in the supine position under the operating microscope.  The right eye was identified as the operative eye and it was prepped and draped in the usual sterile ophthalmic fashion.   A 1 millimeter clear-corneal paracentesis was made at the 12:00 position.  0.5 ml of preservative-free 1% lidocaine was injected into the anterior chamber. The anterior chamber was filled with Viscoat viscoelastic.  A 2.4 millimeter keratome was used to make a near-clear corneal incision at the 9:00 position.  A curvilinear capsulorrhexis was made with a cystotome and capsulorrhexis forceps.  Balanced salt solution was used to hydrodissect and hydrodelineate the nucleus.   Phacoemulsification was then used in stop and chop fashion to remove the lens nucleus and epinucleus.  The remaining cortex was then removed using the irrigation and aspiration handpiece. Provisc was then placed into the capsular bag to distend it for lens placement.  A lens was then injected into the capsular bag.  The remaining viscoelastic was aspirated.   Wounds were hydrated with balanced salt solution.   The anterior chamber was inflated to a physiologic pressure with balanced salt solution.  No wound leaks were noted. Cefuroxime 0.1 ml of a 10mg /ml solution was injected into the anterior chamber for a dose of 1 mg of intracameral antibiotic at the completion of the case.   Timolol and Brimonidine drops were applied to the eye.  The patient was taken to the recovery room in stable condition without complications of anesthesia or surgery.   [name] 02/12/2018, 11:52 AM

## 2018-02-12 NOTE — Anesthesia Procedure Notes (Signed)
Procedure Name: MAC Date/Time: 02/12/2018 11:34 AM Performed by: Cameron Ali, CRNA Pre-anesthesia Checklist: Patient identified, Emergency Drugs available, Suction available, Timeout performed and Patient being monitored Patient Re-evaluated:Patient Re-evaluated prior to induction Oxygen Delivery Method: Nasal cannula Placement Confirmation: positive ETCO2

## 2018-02-13 ENCOUNTER — Encounter: Payer: Self-pay | Admitting: Ophthalmology

## 2018-02-17 ENCOUNTER — Ambulatory Visit: Payer: Medicare HMO | Admitting: Family

## 2018-02-17 ENCOUNTER — Ambulatory Visit (HOSPITAL_COMMUNITY)
Admission: RE | Admit: 2018-02-17 | Discharge: 2018-02-17 | Disposition: A | Discharge status: Home / Self Care | Payer: Medicare HMO | Source: Ambulatory Visit | Attending: Surgery | Admitting: Surgery

## 2018-02-17 ENCOUNTER — Encounter: Payer: Self-pay | Admitting: Family

## 2018-02-17 VITALS — BP 122/70 | HR 74 | Temp 97.3°F | Resp 14 | Ht 62.0 in | Wt 124.0 lb

## 2018-02-17 DIAGNOSIS — I6300 Cerebral infarction due to thrombosis of unspecified precerebral artery: Secondary | ICD-10-CM | POA: Insufficient documentation

## 2018-02-17 DIAGNOSIS — I6501 Occlusion and stenosis of right vertebral artery: Secondary | ICD-10-CM

## 2018-02-17 DIAGNOSIS — I771 Stricture of artery: Secondary | ICD-10-CM | POA: Diagnosis not present

## 2018-02-17 NOTE — Progress Notes (Signed)
Chief Complaint: Follow up Subclavian Artery Stenosis   History of Present Illness  [name] is a 72 y.o. female whom Dr. Trula Slade initially saw in October 2018 forevaluation of asymmetric blood pressures. The patient initially began having problems in April 2018, she became lightheaded.  She checked her blood pressure and found that it was lower in the left arm measuring 92/68. Her right arm remained normal with systolic pressures in the 120s. She went on to have a duplex ultrasound which showed 1-39 percent bilateral carotid stenosis with irregular blood flow in the vertebral arteries.  She no longer has fatigue in her left arm.  She does have a history of stroke in 2016 secondary to a right vertebral artery occlusion. She had a left eye visual changes with her stroke in October 2016.   She is managed for hypercholesterolemia with a statin. She is a former smoker.   She had an arteriogram on 01/01/2017. A greater than 80% stenosis was identified in the proximal portion of the left subclavian, proximal to the vertebral artery. This was treated with primary stenting using 8 x 25 balloon expandable stent. She has not had any symptoms. has known carotid stenosis.  Dr. Trula Slade last evaluated pt on 02-11-17. At that time no significant carotid stenosis was identified on duplex. The left vertebral artery was antegrade, the right was retrograde. The patient's symptoms of vertebrobasilar insufficiency had resolved. Continue to monitor her stent with serial ultrasounds. The next will be in 6 months.   She stays physically active.   Diabetic: no Tobacco use: quit in 1978  Pt meds include: Statin : yes; she thinks the statin causes occasional leg pain.  ASA: no, stopped about August 2019 on advice of her neurologist and she states her cardiologist agreed Other anticoagulants/antiplatelets: Plavix   Past Medical History:  Diagnosis Date  . Anxiety   . Barrett esophagus  2003  . Diverticulitis   . Diverticulosis   . GERD (gastroesophageal reflux disease)   . Glaucoma   . Grave's disease   . H. pylori infection   . Hepatitis A    in her 9 from food  . Hyperlipidemia   . Hypothyroidism   . Increased pressure in the eye   . Insomnia   . Morton's neuroma   . Murmur   . Scoliosis   . Stroke [facility]) 12/2014   no deficits    Social History Social History   Tobacco Use  . Smoking status: Former Smoker    Packs/day: 0.25    Types: Cigarettes    Last attempt to quit: 03/12/1978    Years since quitting: 39.9  . Smokeless tobacco: Never Used  . Tobacco comment: quit in 1978  Substance Use Topics  . Alcohol use: Yes    Alcohol/week: 7.0 standard drinks    Types: 7 Glasses of wine per week  . Drug use: No    Family History Family History  Problem Relation Age of Onset  . Other Father 63       SEPSIS  . Hypertension Father   . Stroke Father 91       CVA  . Heart failure Mother 64  . Heart murmur Mother   . Heart attack Sister 47  . Diabetes Brother   . Breast cancer Sister   . Stroke Sister   . Cirrhosis Brother   . Hypertension Sister        x 2  . Diabetes Sister   . Heart murmur  Daughter     Surgical History Past Surgical History:  Procedure Laterality Date  . AORTIC ARCH ANGIOGRAPHY N/A 01/01/2017   Procedure: AORTIC ARCH ANGIOGRAPHY;  Surgeon: Serafina Mitchell, MD;  Location: [place] [facility];  Service: Cardiovascular;  Laterality: N/A;  . APPENDECTOMY  1958  . CATARACT EXTRACTION Left   . CATARACT EXTRACTION  02/12/2018  . CATARACT EXTRACTION W/PHACO Left 07/02/2016   Procedure: CATARACT EXTRACTION PHACO AND INTRAOCULAR LENS PLACEMENT ([place])  Left;  Surgeon: Ronnell Freshwater, MD;  Location: [place];  Service: Ophthalmology;  Laterality: Left;  . CATARACT EXTRACTION W/PHACO Right 02/12/2018   Procedure: CATARACT EXTRACTION PHACO AND INTRAOCULAR LENS PLACEMENT ([place])  RIGHT;  Surgeon: Leandrew Koyanagi, MD;   Location: [place];  Service: Ophthalmology;  Laterality: Right;  . CERVICAL LAMINECTOMY  1995  . COLON SURGERY    . EYE SURGERY     due to graves disease  . FEMUR SURGERY Right    benign bone growth  . FOOT NEUROMA SURGERY Right   . INTRAVASCULAR PRESSURE WIRE/FFR STUDY N/A 04/19/2017   Procedure: INTRAVASCULAR PRESSURE WIRE/FFR STUDY;  Surgeon: Nelva Bush, MD;  Location: [place] [facility];  Service: Cardiovascular;  Laterality: N/A;  . LAPAROSCOPIC ASSISTED VAGINAL HYSTERECTOMY    . LAPAROSCOPIC SIGMOID COLECTOMY  05/13/06   Dr Zella Richer  . LEFT HEART CATH AND CORONARY ANGIOGRAPHY N/A 04/19/2017   Procedure: LEFT HEART CATH AND CORONARY ANGIOGRAPHY;  Surgeon: Nelva Bush, MD;  Location: [place] [facility];  Service: Cardiovascular;  Laterality: N/A;  . LUMBAR [place] SURGERY  2009  . PERIPHERAL VASCULAR INTERVENTION Left 01/01/2017   Procedure: PERIPHERAL VASCULAR INTERVENTION;  Surgeon: Serafina Mitchell, MD;  Location: [place] [facility];  Service: Cardiovascular;  Laterality: Left;  . SUBCLAVIAN STENT PLACEMENT    . TONSILLECTOMY  1992  . TUBAL LIGATION    . UPPER EXTREMITY ANGIOGRAPHY Left 01/01/2017   Procedure: Upper Extremity Angiography;  Surgeon: Serafina Mitchell, MD;  Location: [place] [facility];  Service: Cardiovascular;  Laterality: Left;    Allergies  Allergen Reactions  . Neuromuscular Blocking Agents Other (See Comments)    Multiple symptoms, SOB, fatigue, weakness  . Vicodin [Hydrocodone-Acetaminophen] Other (See Comments)    Altered mental state    Current Outpatient Medications  Medication Sig Dispense Refill  . acetaminophen (TYLENOL) 500 MG tablet Take 500 mg by mouth daily as needed for moderate pain or headache.     . Calcium Carbonate-Vitamin D (CALCIUM 600+D PO) Take by mouth daily.    . clopidogrel (PLAVIX) 75 MG tablet Take 1 tablet (75 mg total) by mouth daily. 90 tablet 2  . diazepam (VALIUM) 5 MG tablet Take 5 mg by mouth at bedtime  as needed for anxiety (sleep).     . diphenhydrAMINE (BENADRYL) 25 MG tablet Take 25 mg by mouth at bedtime as needed for sleep.    . dorzolamide-timolol (COSOPT) 22.3-6.8 MG/ML ophthalmic solution Place into both eyes daily.    Marland Kitchen latanoprost (XALATAN) 0.005 % ophthalmic solution Place 1 drop into both eyes at bedtime.    Marland Kitchen levothyroxine (SYNTHROID, LEVOTHROID) 88 MCG tablet Take 88 mcg by mouth daily.  4  . Multiple Vitamin (MULTIVITAMIN) tablet Take 1 tablet by mouth daily.    . Multiple Vitamins-Minerals (PRESERVISION AREDS 2 PO) Take by mouth daily.    . pantoprazole (PROTONIX) 40 MG tablet Take 40 mg by mouth daily as needed (indigestion).    . isosorbide mononitrate (IMDUR) 30 MG  24 hr tablet Take 1 tablet (30 mg total) by mouth daily. (Patient taking differently: Take 15 mg by mouth daily. ) 90 tablet 3  . rosuvastatin (CRESTOR) 20 MG tablet Take 1 tablet (20 mg total) by mouth daily. 90 tablet 3   No current facility-administered medications for this visit.     Review of Systems : See HPI for pertinent positives and negatives.  Physical Examination  Vitals:   02/17/18 1431  BP: 122/70  Pulse: 74  Resp: 14  Temp: (!) 97.3 F (36.3 C)  SpO2: 97%  Weight: 124 lb (56.2 kg)  Height: 5\' 2"  (1.575 m)   Body mass index is 22.68 kg/m.  General: WDWN female in NAD GAIT: normal Eyes: PERRLA HENT: No gross abnormalities.  Pulmonary:  Respirations are non-labored, good air movement, CTAB, no rales, rhonchi, or wheezing. Cardiac: regular rhythm, no detected murmur.  VASCULAR EXAM Carotid Bruits Right Left   Negative Negative     Abdominal aortic pulse is not palpable. Radial pulses are 2+ palpable and equal.                                                                                                                                          LE Pulses Right Left       POPLITEAL  not palpable   not palpable       POSTERIOR TIBIAL  2+ palpable   not palpable         DORSALIS PEDIS      ANTERIOR TIBIAL not palpable  2+ palpable     Gastrointestinal: soft, nontender, BS WNL, no r/g, no palpable masses. Musculoskeletal: no muscle atrophy/wasting. M/S 5/5 throughout, extremities without ischemic changes. Skin: No rashes, no ulcers, no cellulitis.   Neurologic:  A&O X 3; appropriate affect, sensation is normal; speech is normal, CN 2-12 intact, pain and light touch intact in extremities, motor exam as listed above. Psychiatric: Normal thought content, mood appropriate to clinical situation    Assessment: AILI CASILLAS is a 72 y.o. female whowho is s/p primary stenting of the proximal portion of the left subclavian, proximal to the vertebral artery, using 8 x 25 balloon expandable stent on 01-01-17 by Dr. Trula Slade.  She has no further dizziness, left arm fatigue has resolved, no subsequent neurological events.   Fortunately she does not have DM and quit smoking in 1978.  She takes a daily statin and Plavix.    DATA Carotid Duplex (02-17-18): 1-39% bilateral ICA stenosis. Right vertebral artery occluded, left flow is antegrade. Bilateral subclavian artery waveforms are normal.  No change compared to the exam on 08-12-17.    Plan:  Follow-up in 1 year with Carotid Duplex scan.  I discussed in depth with the patient the nature of atherosclerosis, and emphasized the importance of maximal medical management including strict control of blood pressure, blood glucose, and lipid levels, obtaining regular  exercise, and continued cessation of smoking.  The patient is aware that without maximal medical management the underlying atherosclerotic disease process will progress, limiting the benefit of any interventions. The patient was given information about stroke prevention and what symptoms should prompt the patient to seek immediate medical care. Thank you for allowing [place] to participate in this patient's care.  Clemon Chambers, RN, MSN, FNP-C [facility] Office: [facility] Physician: Trula Slade  02/17/18 2:49 PM

## 2018-02-17 NOTE — Patient Instructions (Signed)

## 2018-02-25 ENCOUNTER — Telehealth: Payer: Self-pay | Admitting: Internal Medicine

## 2018-02-25 NOTE — Telephone Encounter (Signed)
Dr End,  Would you like patient to have any lab work done prior to visit with you in March 2020?

## 2018-02-25 NOTE — Telephone Encounter (Signed)
New message   Patient would like to set up blood work before her office visit in March with Dr. Saunders Revel. Please advise.

## 2018-02-25 NOTE — Telephone Encounter (Signed)
Only labs that will be due around March are a fasting lipid panel.  It looks like she had other labs in July, which were normal.  We could defer repeating labs until around July.  Nelva Bush, MD [facility] Pager: [phone]

## 2018-02-25 NOTE — Telephone Encounter (Signed)
Patient verbalized understanding that no labs are needed at this time. She was appreciative and aware of upcoming appointment date and time.

## 2018-03-03 ENCOUNTER — Encounter: Payer: Self-pay | Admitting: Adult Health

## 2018-03-03 ENCOUNTER — Ambulatory Visit (INDEPENDENT_AMBULATORY_CARE_PROVIDER_SITE_OTHER): Payer: Medicare HMO | Admitting: Adult Health

## 2018-03-03 VITALS — BP 119/71 | HR 67 | Temp 97.8°F | Ht 62.0 in | Wt 123.7 lb

## 2018-03-03 DIAGNOSIS — E039 Hypothyroidism, unspecified: Secondary | ICD-10-CM

## 2018-03-03 DIAGNOSIS — Z0000 Encounter for general adult medical examination without abnormal findings: Secondary | ICD-10-CM

## 2018-03-03 DIAGNOSIS — I959 Hypotension, unspecified: Secondary | ICD-10-CM | POA: Diagnosis not present

## 2018-03-03 DIAGNOSIS — R1013 Epigastric pain: Secondary | ICD-10-CM | POA: Diagnosis not present

## 2018-03-03 DIAGNOSIS — E785 Hyperlipidemia, unspecified: Secondary | ICD-10-CM | POA: Diagnosis not present

## 2018-03-03 NOTE — Assessment & Plan Note (Addendum)
Follows heart healthy diet, reports taking Crestor 20mg  QD

## 2018-03-03 NOTE — Patient Instructions (Signed)
  Abdominal Pain, Adult  Many things can cause belly (abdominal) pain. Most times, belly pain is not dangerous. Many cases of belly pain can be watched and treated at home. Sometimes belly pain is serious, though. Your doctor will try to find the cause of your belly pain. Follow these instructions at home:  Take over-the-counter and prescription medicines only as told by your doctor. Do not take medicines that help you poop (laxatives) unless told to by your doctor.  Drink enough fluid to keep your pee (urine) clear or pale yellow.  Watch your belly pain for any changes.  Keep all follow-up visits as told by your doctor. This is important. Contact a doctor if:  Your belly pain changes or gets worse.  You are not hungry, or you lose weight without trying.  You are having trouble pooping (constipated) or have watery poop (diarrhea) for more than 2-3 days.  You have pain when you pee or poop.  Your belly pain wakes you up at night.  Your pain gets worse with meals, after eating, or with certain foods.  You are throwing up and cannot keep anything down.  You have a fever. Get help right away if:  Your pain does not go away as soon as your doctor says it should.  You cannot stop throwing up.  Your pain is only in areas of your belly, such as the right side or the left lower part of the belly.  You have bloody or black poop, or poop that looks like tar.  You have very bad pain, cramping, or bloating in your belly.  You have signs of not having enough fluid or water in your body (dehydration), such as: ? Dark pee, very little pee, or no pee. ? Cracked lips. ? Dry mouth. ? Sunken eyes. ? Sleepiness. ? Weakness. This information is not intended to replace advice given to you by your health care provider. Make sure you discuss any questions you have with your health care provider. Document Released: 08/15/2007 Document Revised: 09/16/2015 Document Reviewed: 08/10/2015 Elsevier  Interactive Patient Education  2019 [employer].  Continue to drink plenty of fluids and eat a healthy diet. Remain off the OTC eye medication and follow up with your Eye Specialist as directed. If abdominal pain and mild nausea do not completely resolve in 2 weeks, please call your Gastroenterologist. Please follow-up in 4 months - complete physical with fasting labs. HAPPY HOLIDAYS! NICE TO SEE YOU!

## 2018-03-03 NOTE — Progress Notes (Signed)
Subjective:    Patient ID: [name], female    DOB: 17-Nov-1945, 72 y.o.   MRN: [id number]  HPI:  [name] presents upper abd pain and mild nausea that has almost completely resolved. She reports starting OTC Preservision (on/aboute 02/13/18)for early onset macular degeneration as recommended by her Optometrist. She reports using the OTC medication for 1.5 weeks then developed diffuse abd pain, mild nausea with two occurrences of emesis. She also reports abd bloating and gas.  She stopped the Preservision 5 days ago and the sx's have sig reduced. She has not called the Optometrist who recommended the medication. She has not called her established Copywriter, advertising. She denies hematuria/hematochezia  Patient Care Team    Relationship Specialty Notifications Start End  Danford, Berna Spare, NP PCP - General Family Medicine  05/29/16   End, Harrell Gave, MD PCP - Cardiology Cardiology Admissions 04/05/17   Almedia Balls, MD Consulting Physician Orthopedic Surgery  05/29/16   Garvin Fila, MD Consulting Physician Neurology  05/29/16   Milus Banister, MD Attending Physician Gastroenterology  05/29/16   Ronnell Freshwater, MD Referring Physician Ophthalmology  05/29/16     Patient Active Problem List   Diagnosis Date Noted  . Epigastric pain 03/03/2018  . Coronary artery disease of native artery of native heart with stable angina pectoris ([place]) 05/23/2017  . Peripheral vascular disease ([place]) 05/23/2017  . Vaginal dryness 02/25/2017  . Depression, recurrent ([place]) 02/25/2017  . Stable angina ([place]) 01/08/2017  . History of stroke 01/08/2017  . Accelerating angina ([place]) 11/17/2016  . Subclavian artery stenosis, left ([place]) 11/17/2016  . Palpitations 11/17/2016  . Hyperlipidemia LDL goal <70 11/17/2016  . Unequal blood pressure in upper extremities 10/22/2016  . Hypotension 08/07/2016  . Health care maintenance 05/29/2016  . Cerebral infarction due to thrombosis of right vertebral  artery ([place])   . Cerebral infarction ([place]) 12/26/2014  . Anxiety 12/26/2014  . Hypothyroidism 12/26/2014  . GERD (gastroesophageal reflux disease) 12/26/2014  . Glaucoma 12/26/2014  . Allergic rhinitis 12/26/2014  . Cerebrovascular accident (CVA) ([place]) 12/26/2014  . Cerebral infarction due to embolism of right vertebral artery ([place])   . Visual field loss   . Occlusion and stenosis of vertebral artery   . Insomnia 03/23/2013  . Morton's neuroma 11/02/2011  . Chronic sinusitis 10/09/2011     Past Medical History:  Diagnosis Date  . Anxiety   . Barrett esophagus 2003  . Diverticulitis   . Diverticulosis   . GERD (gastroesophageal reflux disease)   . Glaucoma   . Grave's disease   . H. pylori infection   . Hepatitis A    in her 83 from food  . Hyperlipidemia   . Hypothyroidism   . Increased pressure in the eye   . Insomnia   . Morton's neuroma   . Murmur   . Scoliosis   . Stroke [facility]) 12/2014   no deficits     Past Surgical History:  Procedure Laterality Date  . AORTIC ARCH ANGIOGRAPHY N/A 01/01/2017   Procedure: AORTIC ARCH ANGIOGRAPHY;  Surgeon: Serafina Mitchell, MD;  Location: [place] [facility];  Service: Cardiovascular;  Laterality: N/A;  . APPENDECTOMY  1958  . CATARACT EXTRACTION Left   . CATARACT EXTRACTION  02/12/2018  . CATARACT EXTRACTION W/PHACO Left 07/02/2016   Procedure: CATARACT EXTRACTION PHACO AND INTRAOCULAR LENS PLACEMENT ([place])  Left;  Surgeon: Ronnell Freshwater, MD;  Location: [place];  Service: Ophthalmology;  Laterality: Left;  . CATARACT  EXTRACTION W/PHACO Right 02/12/2018   Procedure: CATARACT EXTRACTION PHACO AND INTRAOCULAR LENS PLACEMENT ([place])  RIGHT;  Surgeon: Leandrew Koyanagi, MD;  Location: [place];  Service: Ophthalmology;  Laterality: Right;  . CERVICAL LAMINECTOMY  1995  . COLON SURGERY    . EYE SURGERY     due to graves disease  . FEMUR SURGERY Right    benign bone growth  . FOOT NEUROMA SURGERY  Right   . INTRAVASCULAR PRESSURE WIRE/FFR STUDY N/A 04/19/2017   Procedure: INTRAVASCULAR PRESSURE WIRE/FFR STUDY;  Surgeon: Nelva Bush, MD;  Location: [place] [facility];  Service: Cardiovascular;  Laterality: N/A;  . LAPAROSCOPIC ASSISTED VAGINAL HYSTERECTOMY    . LAPAROSCOPIC SIGMOID COLECTOMY  05/13/06   Dr Zella Richer  . LEFT HEART CATH AND CORONARY ANGIOGRAPHY N/A 04/19/2017   Procedure: LEFT HEART CATH AND CORONARY ANGIOGRAPHY;  Surgeon: Nelva Bush, MD;  Location: [place] [facility];  Service: Cardiovascular;  Laterality: N/A;  . LUMBAR [place] SURGERY  2009  . PERIPHERAL VASCULAR INTERVENTION Left 01/01/2017   Procedure: PERIPHERAL VASCULAR INTERVENTION;  Surgeon: Serafina Mitchell, MD;  Location: [facility] [place] [facility];  Service: Cardiovascular;  Laterality: Left;  . SUBCLAVIAN STENT PLACEMENT    . TONSILLECTOMY  1992  . TUBAL LIGATION    . UPPER EXTREMITY ANGIOGRAPHY Left 01/01/2017   Procedure: Upper Extremity Angiography;  Surgeon: Serafina Mitchell, MD;  Location: [place] [facility];  Service: Cardiovascular;  Laterality: Left;     Family History  Problem Relation Age of Onset  . Other Father 55       SEPSIS  . Hypertension Father   . Stroke Father 73       CVA  . Heart failure Mother 48  . Heart murmur Mother   . Heart attack Sister 7  . Diabetes Brother   . Breast cancer Sister   . Stroke Sister   . Cirrhosis Brother   . Hypertension Sister        x 2  . Diabetes Sister   . Heart murmur Daughter      Social History   Substance and Sexual Activity  Drug Use No     Social History   Substance and Sexual Activity  Alcohol Use Yes  . Alcohol/week: 7.0 standard drinks  . Types: 7 Glasses of wine per week     Social History   Tobacco Use  Smoking Status Former Smoker  . Packs/day: 0.25  . Types: Cigarettes  . Last attempt to quit: 03/12/1978  . Years since quitting: 40.0  Smokeless Tobacco Never Used  Tobacco Comment   quit in 1978      Outpatient Encounter Medications as of 03/03/2018  Medication Sig Note  . acetaminophen (TYLENOL) 500 MG tablet Take 500 mg by mouth daily as needed for moderate pain or headache.    . Calcium Carbonate-Vitamin D (CALCIUM 600+D PO) Take by mouth daily.   . clopidogrel (PLAVIX) 75 MG tablet Take 1 tablet (75 mg total) by mouth daily.   . diazepam (VALIUM) 5 MG tablet Take 5 mg by mouth at bedtime as needed for anxiety (sleep).    . diphenhydrAMINE (BENADRYL) 25 MG tablet Take 25 mg by mouth at bedtime as needed for sleep. 02/12/2018: PRN  . dorzolamide-timolol (COSOPT) 22.3-6.8 MG/ML ophthalmic solution Place into both eyes daily.   . isosorbide dinitrate (ISORDIL) 30 MG tablet Take 15 mg by mouth daily.   Marland Kitchen latanoprost (XALATAN) 0.005 % ophthalmic solution Place 1 drop into both  eyes at bedtime.   Marland Kitchen levothyroxine (SYNTHROID, LEVOTHROID) 88 MCG tablet Take 88 mcg by mouth daily.   . Multiple Vitamin (MULTIVITAMIN) tablet Take 1 tablet by mouth daily.   . Multiple Vitamins-Minerals (PRESERVISION AREDS 2 PO) Take by mouth daily.   . pantoprazole (PROTONIX) 40 MG tablet Take 40 mg by mouth daily as needed (indigestion).   . rosuvastatin (CRESTOR) 20 MG tablet Take 1 tablet (20 mg total) by mouth daily.   . [DISCONTINUED] isosorbide mononitrate (IMDUR) 30 MG 24 hr tablet Take 1 tablet (30 mg total) by mouth daily. (Patient taking differently: Take 15 mg by mouth daily. ) 04/09/2017: Has at pharmacy will pick up today and start    No facility-administered encounter medications on file as of 03/03/2018.     Allergies: Neuromuscular blocking agents and Vicodin [hydrocodone-acetaminophen]  Body mass index is 22.63 kg/m.  Blood pressure 119/71, pulse 67, temperature 97.8 F (36.6 C), temperature source Oral, height 5\' 2"  (1.575 m), weight 123 lb 11.2 oz (56.1 kg), SpO2 98 %.  Review of Systems  Constitutional: Positive for fatigue. Negative for activity change, appetite change, chills,  diaphoresis, fever and unexpected weight change.  Eyes: Negative for visual disturbance.  Respiratory: Negative for cough, chest tightness, shortness of breath, wheezing and stridor.   Cardiovascular: Negative for chest pain, palpitations and leg swelling.  Gastrointestinal: Positive for abdominal pain and nausea. Negative for abdominal distention, anal bleeding, blood in stool, constipation, diarrhea, rectal pain and vomiting.  Genitourinary: Negative for difficulty urinating, enuresis and hematuria.  Musculoskeletal: Negative for arthralgias, back pain, gait problem, joint swelling, myalgias, neck pain and neck stiffness.  Skin: Negative for color change, pallor, rash and wound.  Neurological: Negative for dizziness and headaches.  Hematological: Does not bruise/bleed easily.  Psychiatric/Behavioral: Negative for agitation, behavioral problems, confusion, decreased concentration, dysphoric mood, hallucinations, self-injury, sleep disturbance and suicidal ideas. The patient is not nervous/anxious and is not hyperactive.        Objective:   Physical Exam Vitals signs and nursing note reviewed.  Constitutional:      Appearance: She is well-developed.  HENT:     Head: Normocephalic and atraumatic.  Eyes:     Extraocular Movements: Extraocular movements intact.     Pupils: Pupils are equal, round, and reactive to light.  Cardiovascular:     Rate and Rhythm: Normal rate and regular rhythm.     Heart sounds: Normal heart sounds.  Pulmonary:     Effort: Pulmonary effort is normal. No respiratory distress.     Breath sounds: Normal breath sounds. No stridor. No wheezing, rhonchi or rales.  Chest:     Chest wall: No tenderness.  Abdominal:     General: Abdomen is flat. Bowel sounds are normal. There is no distension. There are no signs of injury.     Tenderness: There is abdominal tenderness in the right upper quadrant and right lower quadrant. There is no right CVA tenderness, left CVA  tenderness, guarding or rebound. Negative signs include Murphy's sign, Rovsing's sign, McBurney's sign, psoas sign and obturator sign.  Skin:    General: Skin is warm and dry.     Capillary Refill: Capillary refill takes less than 2 seconds.  Neurological:     General: No focal deficit present.     Mental Status: She is alert and oriented to person, place, and time.  Psychiatric:        Mood and Affect: Mood normal. Mood is not anxious or depressed.  Behavior: Behavior normal.       Assessment & Plan:   1. Hypothyroidism, unspecified type   2. Health care maintenance   3. Hypotension, unspecified hypotension type   4. Epigastric pain   5. Hyperlipidemia LDL goal <70     Epigastric pain Continue to drink plenty of fluids and eat a healthy diet. Remain off the OTC eye medication and follow up with your Eye Specialist as directed. If abdominal pain and mild nausea do not completely resolve in 2 weeks, please call your Gastroenterologist. Please follow-up in 4 months - complete physical with fasting labs.  Hyperlipidemia LDL goal <70 Follows heart healthy diet, reports taking Crestor 20mg  QD    FOLLOW-UP:  Return in about 4 months (around 07/03/2018) for CPE, Fasting Labs.

## 2018-03-03 NOTE — Assessment & Plan Note (Signed)
Continue to drink plenty of fluids and eat a healthy diet. Remain off the OTC eye medication and follow up with your Eye Specialist as directed. If abdominal pain and mild nausea do not completely resolve in 2 weeks, please call your Gastroenterologist. Please follow-up in 4 months - complete physical with fasting labs.

## 2018-03-11 ENCOUNTER — Other Ambulatory Visit: Payer: Self-pay | Admitting: Internal Medicine

## 2018-03-13 NOTE — Telephone Encounter (Signed)
Spoke with patient. Patient states she will continue seeing Dr. Saunders Revel here in the [place] office.

## 2018-04-13 ENCOUNTER — Other Ambulatory Visit: Payer: Self-pay | Admitting: Internal Medicine

## 2018-04-14 NOTE — Telephone Encounter (Signed)
Refill Request.  

## 2018-04-30 ENCOUNTER — Telehealth: Payer: Self-pay | Admitting: Internal Medicine

## 2018-04-30 ENCOUNTER — Other Ambulatory Visit: Payer: Self-pay | Admitting: Orthopedic Surgery

## 2018-04-30 DIAGNOSIS — M65321 Trigger finger, right index finger: Secondary | ICD-10-CM | POA: Diagnosis not present

## 2018-04-30 NOTE — Telephone Encounter (Signed)
Dr. Saunders Revel, She has appointment with you 3/20 however surgery on 3/16.

## 2018-04-30 NOTE — Telephone Encounter (Signed)
   Primary Cardiologist: Nelva Bush, MD  Chart reviewed as part of pre-operative protocol coverage.   Patient with with history of nonobstructive CAD, peripheral vascular disease with right vertebral artery occlusion and left subclavian artery stenosis status post stenting (12/2016),stroke in 2016, hyperlipidemia, hypothyroidism, and GERD.   She takes ASA and Plavix of PAD (followed by Dr. Trula Slade, recently seen by Vinnie Level Nickel 02/2018).   Will route to vascular and PCP to review Plavix. Then will need phone call for cardiac clearance.   [place], [place] 04/30/2018, 3:09 PM

## 2018-04-30 NOTE — Telephone Encounter (Signed)
You can try to move up her appointment with me, if an opening is available.  Otherwise, she should be seen by an APP prior to her surgery date for preop eval.  Thanks.  Nelva Bush, MD [facility] Pager: [phone]

## 2018-04-30 NOTE — Telephone Encounter (Signed)
Patient is on DAPT following left subclavian artery stent by Dr. Trula Slade.  She does not have a cardiac indication for DAPT.  Will defer recommendations regarding clopidogrel to vascular surgery.  As it has been almost 1 year since her last office visit, [name] will need to be seen in order to provide perioperative cardiac risk assessment.  Nelva Bush, MD [facility] Pager: [phone]

## 2018-04-30 NOTE — Telephone Encounter (Signed)
° °  [facility] Pre-operative Risk Assessment    Request for surgical clearance:  1. What type of surgery is being performed? Right Index Finger Trigger Release    2. When is this surgery scheduled? May 26, 2018   3. What type of clearance is required (medical clearance vs. Pharmacy clearance to hold med vs. Both)? both  4. Are there any medications that need to be held prior to surgery and how long? Plavix    5. Practice name and name of physician performing surgery? [facility], Dr [name]   6. What is your office phone number [phone]    7.   What is your office fax number [phone]  8.   Anesthesia type (None, local, MAC, general) ? Choice    [name] 04/30/2018, 2:51 PM  _________________________________________________________________   (provider comments below)

## 2018-05-01 NOTE — Telephone Encounter (Signed)
Pt has been scheduled to see Christell Faith, PA-C, 05/09/2018 @ 2:00 Pt advised that we left appt with Dr. Saunders Revel on for 05/30/2018 and if she feels she don't need it, per pt, she will cancel.

## 2018-05-08 NOTE — Progress Notes (Signed)
Cardiology Office Note Date:  05/09/2018  Patient ID:  [name], [name] 1945-06-18, MRN [id number] PCP:  Esaw Grandchild, NP  Cardiologist:  Dr. Saunders Revel, MD    Chief Complaint: Preoperative evaluation  History of Present Illness: [name] is a 73 y.o. female with history of nonobstructive CAD, PVD with right vertebral artery occlusion and left subclavian artery stenosis status post stenting and 12/2016 by vascular surgery, stroke in 2016, hyperlipidemia, hypothyroidism, and GERD who presents for preoperative cardiac evaluation.  Prior echo from 12/2014 showed an EF of 60 to 87%, grade 1 diastolic dysfunction, normal RV size and systolic function, no significant valvular abnormalities.  Carotid artery ultrasound from 10/2016 showed bilateral 1 to 39% ICA stenosis.  Upper extremity arterial ultrasound in 10/2016 showed the right upper extremity was normal with the left upper extremity demonstrating dampened biphasic to monophasic waveforms and 20 mmHg pressure difference when compared to the right.  This was followed by evaluation by vascular surgery in which she was found to have left subclavian artery stenosis and underwent successful stenting on 01/01/2017.  Exercise Myoview in 11/2016 was a low risk study with an LVEF of 65%.  Patient complained of fatigue during the stress test and was able to exercise for 5 minutes and 30 seconds.  30-day event monitor in 11/2016 showed sinus rhythm with no significant arrhythmia.  Patient was evaluated in 03/2017 with substernal chest burning.  Subsequent cath in 04/2017 showed left main was normal, proximal LAD 50% stenosis with an FFR of 0.92, mid LAD 25% stenosis, ramus with minimal luminal irregularities, LCx angiographically normal, small in size OM's, mild diffuse disease throughout the RCA.  LVEDP 17 mm.  With an EF of 55 to 65%.  Medical therapy for microvascular disease was recommended.  She was last seen in the office and 05/2017 and reported feeling  about the same.  She reported not having any chest pain following her cath until she began walking within the past several days leading up to her appointment at that time.  She also noted intermittent dizziness when she turns her head quickly which has been present for several years.  Patient contacted our office in 10/2017 requesting to come off aspirin which was felt to be acceptable.  It was recommended she continue Plavix as directed by vascular surgery.  We received a request for preoperative cardiac evaluation for right trigger finger release which is scheduled for 05/26/2018.  It has been recommended the patient contact her vascular surgeon for recommendations regarding the patient's Plavix as she is on this for noncardiac reasons.  She comes in today to assess her risk stratification for noncardiac procedure.  She comes in doing well from a cardiac perspective.  No further chest pain.  No shortness of breath, dizziness, presyncope, or syncope.  She is walking regularly and walked for 3-1/2 miles on 05/07/2018 without limitation.  She really feels like Imdur 15 mg daily has helped significantly.  No falls since she was last seen.  No BRBPR or melena.  No lower extremity swelling, abdominal distention, orthopnea, PND, early satiety.  She has not followed up with vascular surgery for routine imaging of her subclavian artery stent.  She remains on monotherapy with Plavix.  She is tentatively scheduled to have trigger finger release performed on 05/26/2018.  She needs clearance and guidance from vascular surgery with regards to her Plavix.  D[facility] Activity Status Index: 29.45 METs  Truman Hayward Revised Cardiac Index: Low risk with an estimated rate  of 0.9% for MI, pulmonary edema, ventricular fibrillation, cardiac arrest, or complete heart block  Labs: 09/2017 - CBC normal, potassium 4.8, serum creatinine 0.82, LFT normal 06/2017 - LDL 65, ALT normal  Past Medical History:  Diagnosis Date  . Anxiety   . Barrett  esophagus 2003  . Diverticulitis   . Diverticulosis   . GERD (gastroesophageal reflux disease)   . Glaucoma   . Grave's disease   . H. pylori infection   . Hepatitis A    in her 75 from food  . Hyperlipidemia   . Hypothyroidism   . Increased pressure in the eye   . Insomnia   . Morton's neuroma   . Murmur   . Scoliosis   . Stroke [facility]) 12/2014   no deficits    Past Surgical History:  Procedure Laterality Date  . AORTIC ARCH ANGIOGRAPHY N/A 01/01/2017   Procedure: AORTIC ARCH ANGIOGRAPHY;  Surgeon: Serafina Mitchell, MD;  Location: [place] [facility];  Service: Cardiovascular;  Laterality: N/A;  . APPENDECTOMY  1958  . CATARACT EXTRACTION Left   . CATARACT EXTRACTION  02/12/2018  . CATARACT EXTRACTION W/PHACO Left 07/02/2016   Procedure: CATARACT EXTRACTION PHACO AND INTRAOCULAR LENS PLACEMENT ([place])  Left;  Surgeon: Ronnell Freshwater, MD;  Location: [place];  Service: Ophthalmology;  Laterality: Left;  . CATARACT EXTRACTION W/PHACO Right 02/12/2018   Procedure: CATARACT EXTRACTION PHACO AND INTRAOCULAR LENS PLACEMENT ([place])  RIGHT;  Surgeon: Leandrew Koyanagi, MD;  Location: [place];  Service: Ophthalmology;  Laterality: Right;  . CERVICAL LAMINECTOMY  1995  . COLON SURGERY    . EYE SURGERY     due to graves disease  . FEMUR SURGERY Right    benign bone growth  . FOOT NEUROMA SURGERY Right   . INTRAVASCULAR PRESSURE WIRE/FFR STUDY N/A 04/19/2017   Procedure: INTRAVASCULAR PRESSURE WIRE/FFR STUDY;  Surgeon: Nelva Bush, MD;  Location: [place] [facility];  Service: Cardiovascular;  Laterality: N/A;  . LAPAROSCOPIC ASSISTED VAGINAL HYSTERECTOMY    . LAPAROSCOPIC SIGMOID COLECTOMY  05/13/06   Dr Zella Richer  . LEFT HEART CATH AND CORONARY ANGIOGRAPHY N/A 04/19/2017   Procedure: LEFT HEART CATH AND CORONARY ANGIOGRAPHY;  Surgeon: Nelva Bush, MD;  Location: [place] [facility];  Service: Cardiovascular;  Laterality: N/A;  . LUMBAR [place] SURGERY   2009  . PERIPHERAL VASCULAR INTERVENTION Left 01/01/2017   Procedure: PERIPHERAL VASCULAR INTERVENTION;  Surgeon: Serafina Mitchell, MD;  Location: [place] [facility];  Service: Cardiovascular;  Laterality: Left;  . SUBCLAVIAN STENT PLACEMENT    . TONSILLECTOMY  1992  . TUBAL LIGATION    . UPPER EXTREMITY ANGIOGRAPHY Left 01/01/2017   Procedure: Upper Extremity Angiography;  Surgeon: Serafina Mitchell, MD;  Location: [place] [facility];  Service: Cardiovascular;  Laterality: Left;    Current Meds  Medication Sig  . acetaminophen (TYLENOL) 500 MG tablet Take 500 mg by mouth daily as needed for moderate pain or headache.   . Calcium Carbonate-Vitamin D (CALCIUM 600+D PO) Take by mouth daily.  . clopidogrel (PLAVIX) 75 MG tablet Take 1 tablet (75 mg total) by mouth daily.  . diazepam (VALIUM) 5 MG tablet Take 5 mg by mouth at bedtime as needed for anxiety (sleep).   . diphenhydrAMINE (BENADRYL) 25 MG tablet Take 25 mg by mouth at bedtime as needed for sleep.  . dorzolamide-timolol (COSOPT) 22.3-6.8 MG/ML ophthalmic solution Place into both eyes daily.  . isosorbide mononitrate (IMDUR) 30 MG 24 hr tablet  Take 15 mg by mouth daily.  Marland Kitchen latanoprost (XALATAN) 0.005 % ophthalmic solution Place 1 drop into both eyes at bedtime.  Marland Kitchen levothyroxine (SYNTHROID, LEVOTHROID) 88 MCG tablet Take 88 mcg by mouth daily.  . Multiple Vitamin (MULTIVITAMIN) tablet Take 1 tablet by mouth daily.  . Multiple Vitamins-Minerals (PRESERVISION AREDS 2 PO) Take by mouth daily.  . pantoprazole (PROTONIX) 40 MG tablet Take 40 mg by mouth daily as needed (indigestion).  . rosuvastatin (CRESTOR) 20 MG tablet TAKE 1 TABLET BY MOUTH EVERY DAY    Allergies:   Neuromuscular blocking agents and Vicodin [hydrocodone-acetaminophen]   Social History:  The patient  reports that she quit smoking about 40 years ago. Her smoking use included cigarettes. She smoked 0.25 packs per day. She has never used smokeless tobacco. She reports  current alcohol use of about 7.0 standard drinks of alcohol per week. She reports that she does not use drugs.   Family History:  The patient's family history includes Breast cancer in her sister; Cirrhosis in her brother; Diabetes in her brother and sister; Heart attack (age of onset: 8) in her sister; Heart failure (age of onset: 4) in her mother; Heart murmur in her daughter and mother; Hypertension in her father and sister; Other (age of onset: 16) in her father; Stroke in her sister; Stroke (age of onset: 46) in her father.  ROS:   Review of Systems  Constitutional: Negative for chills, diaphoresis, fever, malaise/fatigue and weight loss.  HENT: Negative for congestion.   Eyes: Negative for discharge and redness.  Respiratory: Negative for cough, hemoptysis, sputum production, shortness of breath and wheezing.   Cardiovascular: Negative for chest pain, palpitations, orthopnea, claudication, leg swelling and PND.  Gastrointestinal: Negative for abdominal pain, blood in stool, heartburn, melena, nausea and vomiting.  Genitourinary: Negative for hematuria.  Musculoskeletal: Negative for falls and myalgias.  Skin: Negative for rash.  Neurological: Negative for dizziness, tingling, tremors, sensory change, speech change, focal weakness, loss of consciousness and weakness.  Endo/Heme/Allergies: Does not bruise/bleed easily.  Psychiatric/Behavioral: Negative for substance abuse. The patient is not nervous/anxious.   All other systems reviewed and are negative.    PHYSICAL EXAM:  VS:  BP 124/62 (BP Location: Left Arm, Patient Position: Sitting, Cuff Size: Normal)   Pulse 72   Ht 5\' 2"  (1.575 m)   Wt 125 lb 12 oz (57 kg)   BMI 23.00 kg/m  BMI: Body mass index is 23 kg/m.  Physical Exam  Constitutional: She is oriented to person, place, and time. She appears well-developed and well-nourished.  HENT:  Head: Normocephalic and atraumatic.  Eyes: Right eye exhibits no discharge. Left eye  exhibits no discharge.  Neck: Normal range of motion. No JVD present.  Cardiovascular: Normal rate, regular rhythm, S1 normal, S2 normal and normal heart sounds. Exam reveals no distant heart sounds, no friction rub, no midsystolic click and no opening snap.  No murmur heard. Pulses:      Dorsalis pedis pulses are 2+ on the right side and 2+ on the left side.       Posterior tibial pulses are 2+ on the right side and 2+ on the left side.  Pulmonary/Chest: Effort normal and breath sounds normal. No respiratory distress. She has no decreased breath sounds. She has no wheezes. She has no rales. She exhibits no tenderness.  Abdominal: Soft. She exhibits no distension. There is no abdominal tenderness.  Musculoskeletal:        General: No edema.  Neurological: She  is alert and oriented to person, place, and time.  Skin: Skin is warm and dry. No cyanosis. Nails show no clubbing.  Psychiatric: She has a normal mood and affect. Her speech is normal and behavior is normal. Judgment and thought content normal.     EKG:  Was ordered and interpreted by me today. Shows NSR, 72 bpm, low voltage QRS, no acute st/t changes   Recent Labs: 09/23/2017: ALT 23; BUN 11; Creatinine, Ser 0.82; Hemoglobin 14.2; Platelets 227; Potassium 4.8; Sodium 136  07/09/2017: Chol/HDL Ratio 2.6; Cholesterol, Total 140; HDL 53; LDL Calculated 65; Triglycerides 112   CrCl cannot be calculated (Patient's most recent lab result is older than the maximum 21 days allowed.).   Wt Readings from Last 3 Encounters:  05/09/18 125 lb 12 oz (57 kg)  03/03/18 123 lb 11.2 oz (56.1 kg)  02/17/18 124 lb (56.2 kg)     Other studies reviewed: Additional studies/records reviewed today include: summarized above  ASSESSMENT AND PLAN:  1. Preoperative cardiac evaluation: Patient is tentatively scheduled for trigger finger release on 05/26/2018.  She has been found to be low risk for noncardiac surgery as outlined above.  She is well  compensated and without symptoms of angina or decompensated heart failure.  No further cardiac testing is needed in the preoperative timeframe.  She has been advised to contact her vascular surgeon for directions and management of her Plavix.  2. Nonobstructive CAD: No symptoms concerning for angina.  Continue Plavix given that this is being used for her vascular stenting.  From a cardiac perspective, she could be transitioned from Plavix to aspirin 81 mg daily.  However, this decision will be left to vascular surgery as outlined below.  Continue Imdur 15 mg daily.  Aggressive risk factor modification.  3. PVD/subclavian artery stenosis: She has been lost to follow-up with vascular surgery.  Recommendations regarding her antiplatelet therapy will need to be made by vascular surgery.  Given that she is overdue for follow-up imaging with them, I have provided her with their number to schedule an appointment and have them weigh on the discontinuing of Plavix in the perioperative timeframe.  She understands not to make any medication changes with regards to her antiplatelet therapy without guidance from a vascular surgery.  4. Hyperlipidemia: Most recent LDL of 65 from 06/2017.  Remains on Crestor.  5. History of stroke: Followed by neurology.  LDL at goal as above.  Remains on Plavix as above.  Disposition: F/u with Dr. Saunders Revel in 6 months.   Current medicines are reviewed at length with the patient today.  The patient did not have any concerns regarding medicines.  Signed, Christell Faith, PA-C 05/09/2018 2:11 PM     [place] [address], [place] [address] [phone]

## 2018-05-09 ENCOUNTER — Encounter: Payer: Self-pay | Admitting: Physician Assistant

## 2018-05-09 ENCOUNTER — Ambulatory Visit: Payer: Medicare HMO | Admitting: Physician Assistant

## 2018-05-09 VITALS — BP 124/62 | HR 72 | Ht 62.0 in | Wt 125.8 lb

## 2018-05-09 DIAGNOSIS — Z01810 Encounter for preprocedural cardiovascular examination: Secondary | ICD-10-CM

## 2018-05-09 DIAGNOSIS — I2510 Atherosclerotic heart disease of native coronary artery without angina pectoris: Secondary | ICD-10-CM

## 2018-05-09 DIAGNOSIS — I6300 Cerebral infarction due to thrombosis of unspecified precerebral artery: Secondary | ICD-10-CM

## 2018-05-09 DIAGNOSIS — E785 Hyperlipidemia, unspecified: Secondary | ICD-10-CM | POA: Diagnosis not present

## 2018-05-09 DIAGNOSIS — I739 Peripheral vascular disease, unspecified: Secondary | ICD-10-CM | POA: Diagnosis not present

## 2018-05-09 DIAGNOSIS — G458 Other transient cerebral ischemic attacks and related syndromes: Secondary | ICD-10-CM

## 2018-05-09 NOTE — Patient Instructions (Signed)
Medication Instructions:  Your physician recommends that you continue on your current medications as directed. Please refer to the Current Medication list given to you today.  If you need a refill on your cardiac medications before your next appointment, please call your pharmacy.   Lab work: None ordered  If you have labs (blood work) drawn today and your tests are completely normal, you will receive your results only by: Marland Kitchen MyChart Message (if you have MyChart) OR . A paper copy in the mail If you have any lab test that is abnormal or we need to change your treatment, we will call you to review the results.  Testing/Procedures: None ordered   Follow-Up: At [facility], you and your health needs are our priority.  As part of our continuing mission to provide you with exceptional heart care, we have created designated Provider Care Teams.  These Care Teams include your primary Cardiologist (physician) and Advanced Practice Providers (APPs -  Physician Assistants and Nurse Practitioners) who all work together to provide you with the care you need, when you need it. You will need a follow up appointment in 6 months.  Please see Nelva Bush, MD.  Any Other Special Instructions Will Be Listed Below (If Applicable). Please call Dr. Awilda Bill office for follow up and ultrasound.  [phone].

## 2018-05-12 ENCOUNTER — Telehealth: Payer: Self-pay | Admitting: Surgery

## 2018-05-12 NOTE — Telephone Encounter (Addendum)
Pt is aware and verbalized understanding. Thurston Hole., LPN  ----- Message from Serafina Mitchell, MD sent at 05/09/2018  7:10 PM EST ----- Regarding: RE: Hold Plavix Hold plavix for 7 days and restart when appropriate ----- Message ----- From: Kaleen Mask, LPN Sent: 10/29/8136   4:57 PM EST To: Serafina Mitchell, MD Subject: Hold Plavix                                    Hello.  Pt called late wanting to know how long she should hold her Plavix prior to surgery on her finger scheduled for March 16th.  Pt aware this message will be answered next wk.    Thanks, Thurston Hole., LPN

## 2018-05-16 ENCOUNTER — Other Ambulatory Visit: Payer: Self-pay

## 2018-05-16 ENCOUNTER — Encounter (HOSPITAL_BASED_OUTPATIENT_CLINIC_OR_DEPARTMENT_OTHER): Payer: Self-pay | Admitting: *Deleted

## 2018-05-16 NOTE — Progress Notes (Signed)
Spoke with patient for PAT call, she states Dr Stephens Shire office notified her this week that she could hold her Plavix x7d prior to surgery.

## 2018-05-23 NOTE — Telephone Encounter (Signed)
Contacted the [employer] and spoke with [place]. Suanne Marker confirms that the patient's note for pre-op cardiac clearance was received. No further action needed.

## 2018-05-23 NOTE — Telephone Encounter (Signed)
[facility] calling  Needs a status update on patient clearance ASAP, office has not received anything and patient's surgery is on Monday 3/16 Patient saw R Dunn on 2/28 Please complete clearance or call office at [phone] ask for Bethanie Dicker or any of the nurses Fax is [phone]

## 2018-05-23 NOTE — Telephone Encounter (Signed)
   Primary Cardiologist: Nelva Bush, MD  Pt was seen by Christell Faith, PA, on 05/09/18 and he cleared her for surgery. He also advised that she f/u with her vascular MD regarding recommendations for Plavix.   His note outlines "Preoperative cardiac evaluation: Patient is tentatively scheduled for trigger finger release on 05/26/2018.  She has been found to be low risk for noncardiac surgery as outlined above.  She is well compensated and without symptoms of angina or decompensated heart failure.  No further cardiac testing is needed in the preoperative timeframe.  She has been advised to contact her vascular surgeon for directions and management of her Plavix".   I will route this recommendation to the requesting party via Epic fax function and remove from pre-op pool.  Please call with questions.  Lyda Jester, PA-C 05/23/2018, 12:19 PM

## 2018-05-26 ENCOUNTER — Encounter (HOSPITAL_BASED_OUTPATIENT_CLINIC_OR_DEPARTMENT_OTHER)
Admission: RE | Disposition: A | Discharge status: Home / Self Care | Payer: Self-pay | Source: Home / Self Care | Attending: Orthopedic Surgery

## 2018-05-26 ENCOUNTER — Other Ambulatory Visit: Payer: Self-pay

## 2018-05-26 ENCOUNTER — Encounter (HOSPITAL_BASED_OUTPATIENT_CLINIC_OR_DEPARTMENT_OTHER): Payer: Self-pay | Admitting: *Deleted

## 2018-05-26 ENCOUNTER — Ambulatory Visit (HOSPITAL_BASED_OUTPATIENT_CLINIC_OR_DEPARTMENT_OTHER): Payer: Medicare HMO | Admitting: Anesthesiology

## 2018-05-26 ENCOUNTER — Ambulatory Visit (HOSPITAL_BASED_OUTPATIENT_CLINIC_OR_DEPARTMENT_OTHER)
Admission: RE | Admit: 2018-05-26 | Discharge: 2018-05-26 | Disposition: A | Discharge status: Home / Self Care | Payer: Medicare HMO | Attending: Orthopedic Surgery | Admitting: Orthopedic Surgery

## 2018-05-26 DIAGNOSIS — Z7902 Long term (current) use of antithrombotics/antiplatelets: Secondary | ICD-10-CM | POA: Insufficient documentation

## 2018-05-26 DIAGNOSIS — I2510 Atherosclerotic heart disease of native coronary artery without angina pectoris: Secondary | ICD-10-CM | POA: Diagnosis not present

## 2018-05-26 DIAGNOSIS — F419 Anxiety disorder, unspecified: Secondary | ICD-10-CM | POA: Diagnosis not present

## 2018-05-26 DIAGNOSIS — M65321 Trigger finger, right index finger: Secondary | ICD-10-CM | POA: Insufficient documentation

## 2018-05-26 DIAGNOSIS — K219 Gastro-esophageal reflux disease without esophagitis: Secondary | ICD-10-CM | POA: Diagnosis not present

## 2018-05-26 DIAGNOSIS — E039 Hypothyroidism, unspecified: Secondary | ICD-10-CM | POA: Insufficient documentation

## 2018-05-26 DIAGNOSIS — H409 Unspecified glaucoma: Secondary | ICD-10-CM | POA: Insufficient documentation

## 2018-05-26 DIAGNOSIS — E785 Hyperlipidemia, unspecified: Secondary | ICD-10-CM | POA: Diagnosis not present

## 2018-05-26 DIAGNOSIS — Z8673 Personal history of transient ischemic attack (TIA), and cerebral infarction without residual deficits: Secondary | ICD-10-CM | POA: Diagnosis not present

## 2018-05-26 DIAGNOSIS — Z79890 Hormone replacement therapy (postmenopausal): Secondary | ICD-10-CM | POA: Insufficient documentation

## 2018-05-26 DIAGNOSIS — Z87891 Personal history of nicotine dependence: Secondary | ICD-10-CM | POA: Insufficient documentation

## 2018-05-26 DIAGNOSIS — R69 Illness, unspecified: Secondary | ICD-10-CM | POA: Diagnosis not present

## 2018-05-26 DIAGNOSIS — Z79899 Other long term (current) drug therapy: Secondary | ICD-10-CM | POA: Diagnosis not present

## 2018-05-26 HISTORY — PX: TRIGGER FINGER RELEASE: SHX641

## 2018-05-26 SURGERY — RELEASE, A1 PULLEY, FOR TRIGGER FINGER
Anesthesia: Monitor Anesthesia Care | Site: Hand | Laterality: Right

## 2018-05-26 MED ORDER — ACETAMINOPHEN 500 MG PO TABS
ORAL_TABLET | ORAL | Status: AC
  Filled 2018-05-26: qty 2

## 2018-05-26 MED ORDER — CEFAZOLIN SODIUM-DEXTROSE 2-4 GM/100ML-% IV SOLN
INTRAVENOUS | Status: AC
  Filled 2018-05-26: qty 100

## 2018-05-26 MED ORDER — LIDOCAINE HCL (PF) 1 % IJ SOLN
INTRAMUSCULAR | Status: DC | PRN
  Administered 2018-05-26: 4.5 mL

## 2018-05-26 MED ORDER — BUPIVACAINE HCL (PF) 0.25 % IJ SOLN
INTRAMUSCULAR | Status: DC | PRN
  Administered 2018-05-26: 4.5 mL

## 2018-05-26 MED ORDER — ONDANSETRON HCL 4 MG/2ML IJ SOLN
4.0000 mg | Freq: Once | INTRAMUSCULAR | Status: AC | PRN
  Administered 2018-05-26: 4 mg via INTRAVENOUS

## 2018-05-26 MED ORDER — SCOPOLAMINE 1 MG/3DAYS TD PT72: 1.0000 | MEDICATED_PATCH | Freq: Once | TRANSDERMAL | Status: DC | PRN

## 2018-05-26 MED ORDER — BUPIVACAINE HCL (PF) 0.25 % IJ SOLN
INTRAMUSCULAR | Status: AC
  Filled 2018-05-26: qty 30

## 2018-05-26 MED ORDER — FENTANYL CITRATE (PF) 100 MCG/2ML IJ SOLN: 25.0000 ug | INTRAMUSCULAR | Status: DC | PRN

## 2018-05-26 MED ORDER — KETOROLAC TROMETHAMINE 15 MG/ML IJ SOLN: 15.0000 mg | Freq: Once | INTRAMUSCULAR | Status: DC | PRN

## 2018-05-26 MED ORDER — CEFAZOLIN SODIUM-DEXTROSE 2-4 GM/100ML-% IV SOLN
2.0000 g | INTRAVENOUS | Status: AC
  Administered 2018-05-26: 2 g via INTRAVENOUS

## 2018-05-26 MED ORDER — ACETAMINOPHEN 500 MG PO TABS
1000.0000 mg | ORAL_TABLET | Freq: Once | ORAL | Status: AC
  Administered 2018-05-26: 1000 mg via ORAL

## 2018-05-26 MED ORDER — LIDOCAINE HCL (PF) 1 % IJ SOLN
INTRAMUSCULAR | Status: AC
  Filled 2018-05-26: qty 30

## 2018-05-26 MED ORDER — 0.9 % SODIUM CHLORIDE (POUR BTL) OPTIME
TOPICAL | Status: DC | PRN
  Administered 2018-05-26: 200 mL

## 2018-05-26 MED ORDER — MIDAZOLAM HCL 2 MG/2ML IJ SOLN: 1.0000 mg | INTRAMUSCULAR | Status: DC | PRN

## 2018-05-26 MED ORDER — TRAMADOL HCL 50 MG PO TABS: ORAL_TABLET | ORAL | 0 refills | Status: DC

## 2018-05-26 MED ORDER — DEXAMETHASONE SODIUM PHOSPHATE 4 MG/ML IJ SOLN
INTRAMUSCULAR | Status: DC | PRN
  Administered 2018-05-26: 4 mg via INTRAVENOUS

## 2018-05-26 MED ORDER — LACTATED RINGERS IV SOLN
INTRAVENOUS | Status: DC
  Administered 2018-05-26: 13:00:00 via INTRAVENOUS

## 2018-05-26 MED ORDER — CHLORHEXIDINE GLUCONATE 4 % EX LIQD: 60.0000 mL | Freq: Once | CUTANEOUS | Status: DC

## 2018-05-26 MED ORDER — FENTANYL CITRATE (PF) 100 MCG/2ML IJ SOLN: 50.0000 ug | INTRAMUSCULAR | Status: DC | PRN

## 2018-05-26 MED ORDER — PROPOFOL 500 MG/50ML IV EMUL
INTRAVENOUS | Status: DC | PRN
  Administered 2018-05-26: 100 ug/kg/min via INTRAVENOUS

## 2018-05-26 SURGICAL SUPPLY — 32 items
APL PRP STRL LF DISP 70% ISPRP (MISCELLANEOUS) ×1
BLADE SURG 15 STRL LF DISP TIS (BLADE) ×2 IMPLANT
BLADE SURG 15 STRL SS (BLADE) ×4
BNDG CMPR 9X4 STRL LF SNTH (GAUZE/BANDAGES/DRESSINGS) ×1
BNDG COHESIVE 2X5 TAN STRL LF (GAUZE/BANDAGES/DRESSINGS) ×2 IMPLANT
BNDG ESMARK 4X9 LF (GAUZE/BANDAGES/DRESSINGS) ×1 IMPLANT
CHLORAPREP W/TINT 26 (MISCELLANEOUS) ×2 IMPLANT
CORD BIPOLAR FORCEPS 12FT (ELECTRODE) ×2 IMPLANT
COVER BACK TABLE 60X90IN (DRAPES) ×2 IMPLANT
COVER MAYO STAND STRL (DRAPES) ×2 IMPLANT
COVER WAND RF STERILE (DRAPES) IMPLANT
CUFF TOURN SGL QUICK 18X4 (TOURNIQUET CUFF) ×2 IMPLANT
DRAPE EXTREMITY T 121X128X90 (DISPOSABLE) ×2 IMPLANT
DRAPE SURG 17X23 STRL (DRAPES) ×2 IMPLANT
GAUZE SPONGE 4X4 12PLY STRL (GAUZE/BANDAGES/DRESSINGS) ×2 IMPLANT
GAUZE XEROFORM 1X8 LF (GAUZE/BANDAGES/DRESSINGS) ×2 IMPLANT
GLOVE BIO SURGEON STRL SZ7.5 (GLOVE) ×2 IMPLANT
GLOVE BIOGEL PI IND STRL 8 (GLOVE) ×1 IMPLANT
GLOVE BIOGEL PI INDICATOR 8 (GLOVE) ×1
GOWN STRL REUS W/ TWL LRG LVL3 (GOWN DISPOSABLE) ×1 IMPLANT
GOWN STRL REUS W/TWL LRG LVL3 (GOWN DISPOSABLE) ×2
GOWN STRL REUS W/TWL XL LVL3 (GOWN DISPOSABLE) ×2 IMPLANT
NDL HYPO 25X1 1.5 SAFETY (NEEDLE) ×1 IMPLANT
NEEDLE HYPO 25X1 1.5 SAFETY (NEEDLE) ×2 IMPLANT
NS IRRIG 1000ML POUR BTL (IV SOLUTION) ×2 IMPLANT
PACK BASIN DAY SURGERY FS (CUSTOM PROCEDURE TRAY) ×2 IMPLANT
STOCKINETTE 4X48 STRL (DRAPES) ×2 IMPLANT
SUT ETHILON 4 0 PS 2 18 (SUTURE) ×2 IMPLANT
SYR BULB 3OZ (MISCELLANEOUS) ×2 IMPLANT
SYR CONTROL 10ML LL (SYRINGE) ×2 IMPLANT
TOWEL GREEN STERILE FF (TOWEL DISPOSABLE) ×4 IMPLANT
UNDERPAD 30X30 (UNDERPADS AND DIAPERS) ×2 IMPLANT

## 2018-05-26 NOTE — Anesthesia Preprocedure Evaluation (Addendum)
Anesthesia Evaluation  Patient identified by MRN, date of birth, ID band Patient awake    Reviewed: Allergy & Precautions, NPO status , Patient's Chart, lab work & pertinent test results  Airway Mallampati: II  TM Distance: >3 FB Neck ROM: Full    Dental no notable dental hx.    Pulmonary former smoker,    Pulmonary exam normal breath sounds clear to auscultation       Cardiovascular + CAD and + Peripheral Vascular Disease  Normal cardiovascular exam Rhythm:Regular Rate:Normal  ECG: NSR, rate 72  Sees cardiologist (End)   Neuro/Psych PSYCHIATRIC DISORDERS Anxiety Depression CVA, No Residual Symptoms    GI/Hepatic Neg liver ROS, GERD  Medicated and Controlled,  Endo/Other  Hypothyroidism   Renal/GU negative Renal ROS     Musculoskeletal negative musculoskeletal ROS (+) Scoliosis   Abdominal   Peds  Hematology HLD   Anesthesia Other Findings RIGHT INDEX TRIGGER DIGIT  Reproductive/Obstetrics                            Anesthesia Physical Anesthesia Plan  ASA: III  Anesthesia Plan: MAC   Post-op Pain Management:    Induction: Intravenous  PONV Risk Score and Plan: 2 and Ondansetron, Dexamethasone and Treatment may vary due to age or medical condition  Airway Management Planned: Natural Airway  Additional Equipment:   Intra-op Plan:   Post-operative Plan:   Informed Consent: I have reviewed the patients History and Physical, chart, labs and discussed the procedure including the risks, benefits and alternatives for the proposed anesthesia with the patient or authorized representative who has indicated his/her understanding and acceptance.     Dental advisory given  Plan Discussed with: CRNA  Anesthesia Plan Comments:         Anesthesia Quick Evaluation

## 2018-05-26 NOTE — H&P (Signed)
[name] is an 73 y.o. female.   Chief Complaint: right index trigger HPI: 73 yo female with triggering right index finger.  This has been injected without lasting resolution.  She wishes to have a trigger release.  Allergies:  Allergies  Allergen Reactions  . Neuromuscular Blocking Agents Other (See Comments)    Multiple symptoms, SOB, fatigue, weakness  . Vicodin [Hydrocodone-Acetaminophen] Other (See Comments)    Altered mental state    Past Medical History:  Diagnosis Date  . Anxiety   . Barrett esophagus 2003  . Diverticulitis   . Diverticulosis   . GERD (gastroesophageal reflux disease)   . Glaucoma   . Grave's disease   . H. pylori infection   . Hepatitis A    in her 19 from food  . Hyperlipidemia   . Hypothyroidism   . Increased pressure in the eye   . Insomnia   . Morton's neuroma   . Murmur   . Scoliosis   . Stroke [facility]) 12/2014   no deficits    Past Surgical History:  Procedure Laterality Date  . AORTIC ARCH ANGIOGRAPHY N/A 01/01/2017   Procedure: AORTIC ARCH ANGIOGRAPHY;  Surgeon: Serafina Mitchell, MD;  Location: [place] [facility];  Service: Cardiovascular;  Laterality: N/A;  . APPENDECTOMY  1958  . CATARACT EXTRACTION Left   . CATARACT EXTRACTION  02/12/2018  . CATARACT EXTRACTION W/PHACO Left 07/02/2016   Procedure: CATARACT EXTRACTION PHACO AND INTRAOCULAR LENS PLACEMENT ([place])  Left;  Surgeon: Ronnell Freshwater, MD;  Location: [place];  Service: Ophthalmology;  Laterality: Left;  . CATARACT EXTRACTION W/PHACO Right 02/12/2018   Procedure: CATARACT EXTRACTION PHACO AND INTRAOCULAR LENS PLACEMENT ([place])  RIGHT;  Surgeon: Leandrew Koyanagi, MD;  Location: [place];  Service: Ophthalmology;  Laterality: Right;  . CERVICAL LAMINECTOMY  1995  . COLON SURGERY    . EYE SURGERY     due to graves disease  . FEMUR SURGERY Right    benign bone growth  . FOOT NEUROMA SURGERY Right   . INTRAVASCULAR PRESSURE WIRE/FFR STUDY  N/A 04/19/2017   Procedure: INTRAVASCULAR PRESSURE WIRE/FFR STUDY;  Surgeon: Nelva Bush, MD;  Location: [place] [facility];  Service: Cardiovascular;  Laterality: N/A;  . LAPAROSCOPIC ASSISTED VAGINAL HYSTERECTOMY    . LAPAROSCOPIC SIGMOID COLECTOMY  05/13/06   Dr Zella Richer  . LEFT HEART CATH AND CORONARY ANGIOGRAPHY N/A 04/19/2017   Procedure: LEFT HEART CATH AND CORONARY ANGIOGRAPHY;  Surgeon: Nelva Bush, MD;  Location: [place] [facility];  Service: Cardiovascular;  Laterality: N/A;  . LUMBAR [place] SURGERY  2009  . PERIPHERAL VASCULAR INTERVENTION Left 01/01/2017   Procedure: PERIPHERAL VASCULAR INTERVENTION;  Surgeon: Serafina Mitchell, MD;  Location: [place] [facility];  Service: Cardiovascular;  Laterality: Left;  . SUBCLAVIAN STENT PLACEMENT    . TONSILLECTOMY  1992  . TUBAL LIGATION    . UPPER EXTREMITY ANGIOGRAPHY Left 01/01/2017   Procedure: Upper Extremity Angiography;  Surgeon: Serafina Mitchell, MD;  Location: [place] [facility];  Service: Cardiovascular;  Laterality: Left;    Family History: Family History  Problem Relation Age of Onset  . Other Father 12       SEPSIS  . Hypertension Father   . Stroke Father 49       CVA  . Heart failure Mother 90  . Heart murmur Mother   . Heart attack Sister 54  . Diabetes Brother   . Breast cancer Sister   . Stroke  Sister   . Cirrhosis Brother   . Hypertension Sister        x 2  . Diabetes Sister   . Heart murmur Daughter     Social History:   reports that she quit smoking about 40 years ago. Her smoking use included cigarettes. She smoked 0.25 packs per day. She has never used smokeless tobacco. She reports current alcohol use of about 7.0 standard drinks of alcohol per week. She reports that she does not use drugs.  Medications: Medications Prior to Admission  Medication Sig Dispense Refill  . acetaminophen (TYLENOL) 500 MG tablet Take 500 mg by mouth daily as needed for moderate pain or headache.     . Calcium  Carbonate-Vitamin D (CALCIUM 600+D PO) Take by mouth daily.    . clopidogrel (PLAVIX) 75 MG tablet Take 1 tablet (75 mg total) by mouth daily. 90 tablet 2  . diazepam (VALIUM) 5 MG tablet Take 5 mg by mouth at bedtime as needed for anxiety (sleep).     . diphenhydrAMINE (BENADRYL) 25 MG tablet Take 25 mg by mouth at bedtime as needed for sleep.    . dorzolamide-timolol (COSOPT) 22.3-6.8 MG/ML ophthalmic solution Place into both eyes daily.    . isosorbide mononitrate (IMDUR) 30 MG 24 hr tablet Take 15 mg by mouth daily.    Marland Kitchen latanoprost (XALATAN) 0.005 % ophthalmic solution Place 1 drop into both eyes at bedtime.    Marland Kitchen levothyroxine (SYNTHROID, LEVOTHROID) 88 MCG tablet Take 88 mcg by mouth daily.  4  . Multiple Vitamin (MULTIVITAMIN) tablet Take 1 tablet by mouth daily.    . pantoprazole (PROTONIX) 40 MG tablet Take 40 mg by mouth daily as needed (indigestion).    . rosuvastatin (CRESTOR) 20 MG tablet TAKE 1 TABLET BY MOUTH EVERY DAY 90 tablet PRN    No results found for this or any previous visit (from the past 48 hour(s)).  No results found.   A comprehensive review of systems was negative.  Height 5\' 2"  (1.575 m), weight 56.7 kg.  General appearance: alert, cooperative and appears stated age Head: Normocephalic, without obvious abnormality, atraumatic Neck: supple, symmetrical, trachea midline Cardio: regular rate and rhythm Resp: clear to auscultation bilaterally Extremities: Intact sensation and capillary refill all digits.  +epl/fpl/io.  No wounds.  Pulses: 2+ and symmetric Skin: Skin color, texture, turgor normal. No rashes or lesions Neurologic: Grossly normal Incision/Wound: none  Assessment/Plan Right index finger trigger digit.  Non operative and operative treatment options have been discussed with the patient and patient wishes to proceed with operative treatment. Risks, benefits, and alternatives of surgery have been discussed and the patient agrees with the plan of  care.   [name] 05/26/2018, 12:10 PM

## 2018-05-26 NOTE — Op Note (Addendum)
05/26/2018 [facility]  Operative Note  PREOPERATIVE DIAGNOSIS: RIGHT INDEX TRIGGER DIGIT  POSTOPERATIVE DIAGNOSIS:  RIGHT INDEX TRIGGER DIGIT  PROCEDURE: Procedure(s): RIGHT INDEX RELEASE TRIGGER FINGER/A-1 PULLEY  SURGEON:  [name], MD  ASSISTANT:  none.  ANESTHESIA:  Local with sedation.  IV FLUIDS:  Per anesthesia flow sheet.  ESTIMATED BLOOD LOSS:  Minimal.  COMPLICATIONS:  None.  SPECIMENS:  None.  TOURNIQUET TIME:  Total Tourniquet Time Documented: Forearm (Right) - 9 minutes Total: Forearm (Right) - 9 minutes   DISPOSITION:  Stable to PACU.  LOCATION: [facility]  INDICATIONS: [name] is a 73 y.o. female with triggering right index finger.  This has been injected without lasting resolution.  She wishes to have a trigger release.  Risks, benefits and alternatives of surgery were discussed including the risk of blood loss, infection, damage to nerves, vessels, tendons, ligaments, bone, failure of surgery, need for additional surgery, complications with wound healing, continued pain, continued triggering and need for repeat surgery.  She voiced understanding of these risks and elected to proceed.  OPERATIVE COURSE:  After being identified preoperatively by myself, the patient and I agreed upon the procedure and site of procedure.  The surgical site was marked. The risks, benefits, and alternatives of surgery were reviewed and she wished to proceed.  Surgical consent had been signed. She was given preoperative IV antibiotic prophylaxis. She was transported to the operating room and placed on the operating room table in supine position with the Right upper extremity on an arm board. Sedation was induced by the anesthesiologist.   A surgical pause was performed between surgeons, anesthesia, and operating room staff, and all were in agreement as to the patient, procedure, and site of procedure.  The surgical field was injected with half  and half solution 1% plain lidocaine and 0.25% plain marcaine.  The Right upper extremity was prepped and draped in normal sterile orthopedic fashion. A surgical pause was performed between surgeons, anesthesia, and operating room staff, and all were in agreement as to the patient, procedure, and site of procedure.  Tourniquet at the proximal aspect of the forearm was inflated to 250 mmHg after exsanguination of the arm with an Esmarch bandage.  An incision was made at the volar aspect of the MP joint of the index finger.  This was carried into the subcutaneous tissues by preading technique.  Bipolar electrocautery was used to obtain hemostasis.  The radial and ulnar digital nerves were protected throughout the case. The flexor sheath was identified.  The A1 pulley was identified and sharply incised.  It was released in its entirety.  The proximal 1-2 mm of the A2 pulley was vented to allow better excursion of the tendons.  The finger was placed through a range of motion and there was noted to be no catching.  The tendons were brought through the wound and any adherences released.  The wound was then copiously irrigated with sterile saline. It was closed with 4-0 nylon in a horizontal mattress fashion.  It was injected with 0.25% plain Marcaine to aid in postoperative analgesia.  It was dressed with sterile Xeroform, 4x4s, and wrapped lightly with a Coban dressing.  Tourniquet was deflated at 9 minutes.  The fingertips were pink with brisk capillary refill after deflation of the tourniquet.  The operative drapes were broken down and the patient was awoken from anesthesia safely.  She was transferred back to the stretcher and taken to the PACU in  stable condition.   I will see her back in the office in 1 week for postoperative followup.  I will give her a prescription for Tramadol 50 mg 1-2 tabs PO q6 hours prn pain, dispense # 20.    [name], MD Electronically signed, 05/26/18

## 2018-05-26 NOTE — Anesthesia Postprocedure Evaluation (Signed)
Anesthesia Post Note  Patient: [name]  Procedure(s) Performed: RIGHT INDEX RELEASE TRIGGER FINGER/A-1 PULLEY (Right Hand)     Patient location during evaluation: PACU Anesthesia Type: MAC Level of consciousness: awake and alert Pain management: pain level controlled Vital Signs Assessment: post-procedure vital signs reviewed and stable Respiratory status: spontaneous breathing, nonlabored ventilation, respiratory function stable and patient connected to nasal cannula oxygen Cardiovascular status: stable and blood pressure returned to baseline Postop Assessment: no apparent nausea or vomiting Anesthetic complications: no    Last Vitals:  Vitals:   05/26/18 1458 05/26/18 1535  BP: 136/70 (!) 148/75  Pulse: 71 78  Resp: 14 16  Temp:  36.4 C  SpO2: 97% 98%    Last Pain:  Vitals:   05/26/18 1535  TempSrc:   PainSc: 0-No pain                 Ryan P Ellender

## 2018-05-26 NOTE — Transfer of Care (Signed)
Immediate Anesthesia Transfer of Care Note  Patient: [name]  Procedure(s) Performed: RIGHT INDEX RELEASE TRIGGER FINGER/A-1 PULLEY (Right )  Patient Location: PACU  Anesthesia Type:MAC  Level of Consciousness: awake, alert  and oriented  Airway & Oxygen Therapy: Patient Spontanous Breathing and Patient connected to face mask oxygen  Post-op Assessment: Report given to RN and Post -op Vital signs reviewed and stable  Post vital signs: Reviewed and stable  Last Vitals:  Vitals Value Taken Time  BP    Temp    Pulse 78 05/26/2018  2:18 PM  Resp 15 05/26/2018  2:18 PM  SpO2 98 % 05/26/2018  2:18 PM  Vitals shown include unvalidated device data.  Last Pain:  Vitals:   05/26/18 1239  TempSrc: Oral  PainSc: 0-No pain      Patients Stated Pain Goal: 3 (29/93/71 6967)  Complications: No apparent anesthesia complications

## 2018-05-26 NOTE — Discharge Instructions (Addendum)

## 2018-05-27 ENCOUNTER — Encounter (HOSPITAL_BASED_OUTPATIENT_CLINIC_OR_DEPARTMENT_OTHER): Payer: Self-pay | Admitting: Orthopedic Surgery

## 2018-05-30 ENCOUNTER — Ambulatory Visit: Payer: Medicare HMO | Admitting: Internal Medicine

## 2018-06-26 ENCOUNTER — Other Ambulatory Visit: Payer: Medicare HMO

## 2018-07-03 ENCOUNTER — Encounter: Payer: Medicare HMO | Admitting: Adult Health

## 2018-07-15 DIAGNOSIS — E890 Postprocedural hypothyroidism: Secondary | ICD-10-CM | POA: Diagnosis not present

## 2018-07-22 DIAGNOSIS — E890 Postprocedural hypothyroidism: Secondary | ICD-10-CM | POA: Diagnosis not present

## 2018-08-31 ENCOUNTER — Other Ambulatory Visit: Payer: Self-pay | Admitting: Adult Health

## 2018-09-01 ENCOUNTER — Telehealth: Payer: Self-pay

## 2018-09-01 NOTE — Telephone Encounter (Signed)
Please call pt to schedule appointment.  Patient needs CPE prior to any further refills.  Charyl Bigger, CMA

## 2018-09-15 ENCOUNTER — Other Ambulatory Visit: Payer: Self-pay

## 2018-09-15 ENCOUNTER — Other Ambulatory Visit (INDEPENDENT_AMBULATORY_CARE_PROVIDER_SITE_OTHER): Payer: Medicare HMO

## 2018-09-15 DIAGNOSIS — I959 Hypotension, unspecified: Secondary | ICD-10-CM | POA: Diagnosis not present

## 2018-09-15 DIAGNOSIS — E039 Hypothyroidism, unspecified: Secondary | ICD-10-CM | POA: Diagnosis not present

## 2018-09-15 DIAGNOSIS — R7303 Prediabetes: Secondary | ICD-10-CM | POA: Diagnosis not present

## 2018-09-15 DIAGNOSIS — Z0000 Encounter for general adult medical examination without abnormal findings: Secondary | ICD-10-CM

## 2018-09-16 LAB — CBC WITH DIFFERENTIAL/PLATELET
Basophils Absolute: 0 10*3/uL (ref 0.0–0.2)
Basos: 1 %
EOS (ABSOLUTE): 0.2 10*3/uL (ref 0.0–0.4)
Eos: 3 %
Hematocrit: 43.6 % (ref 34.0–46.6)
Hemoglobin: 14.7 g/dL (ref 11.1–15.9)
Immature Grans (Abs): 0 10*3/uL (ref 0.0–0.1)
Immature Granulocytes: 0 %
Lymphocytes Absolute: 1.5 10*3/uL (ref 0.7–3.1)
Lymphs: 28 %
MCH: 31.5 pg (ref 26.6–33.0)
MCHC: 33.7 g/dL (ref 31.5–35.7)
MCV: 93 fL (ref 79–97)
Monocytes Absolute: 0.6 10*3/uL (ref 0.1–0.9)
Monocytes: 11 %
Neutrophils Absolute: 3.2 10*3/uL (ref 1.4–7.0)
Neutrophils: 57 %
Platelets: 227 10*3/uL (ref 150–450)
RBC: 4.67 x10E6/uL (ref 3.77–5.28)
RDW: 12.6 % (ref 11.7–15.4)
WBC: 5.5 10*3/uL (ref 3.4–10.8)

## 2018-09-16 LAB — TSH: TSH: 4.14 u[IU]/mL (ref 0.450–4.500)

## 2018-09-16 LAB — COMPREHENSIVE METABOLIC PANEL
ALT: 20 IU/L (ref 0–32)
AST: 28 IU/L (ref 0–40)
Albumin/Globulin Ratio: 2.4 — ABNORMAL HIGH (ref 1.2–2.2)
Albumin: 4.7 g/dL (ref 3.7–4.7)
Alkaline Phosphatase: 91 IU/L (ref 39–117)
BUN/Creatinine Ratio: 17 (ref 12–28)
BUN: 12 mg/dL (ref 8–27)
Bilirubin Total: 0.5 mg/dL (ref 0.0–1.2)
CO2: 22 mmol/L (ref 20–29)
Calcium: 9.5 mg/dL (ref 8.7–10.3)
Chloride: 102 mmol/L (ref 96–106)
Creatinine, Ser: 0.72 mg/dL (ref 0.57–1.00)
GFR calc Af Amer: 96 mL/min/{1.73_m2} (ref >59)
GFR calc non Af Amer: 83 mL/min/{1.73_m2} (ref >59)
Globulin, Total: 2 g/dL (ref 1.5–4.5)
Glucose: 106 mg/dL — ABNORMAL HIGH (ref 65–99)
Potassium: 4.9 mmol/L (ref 3.5–5.2)
Sodium: 137 mmol/L (ref 134–144)
Total Protein: 6.7 g/dL (ref 6.0–8.5)

## 2018-09-16 LAB — LIPID PANEL
Chol/HDL Ratio: 2.4 ratio (ref 0.0–4.4)
Cholesterol, Total: 147 mg/dL (ref 100–199)
HDL: 62 mg/dL (ref >39)
LDL Calculated: 67 mg/dL (ref 0–99)
Triglycerides: 90 mg/dL (ref 0–149)
VLDL Cholesterol Cal: 18 mg/dL (ref 5–40)

## 2018-09-16 LAB — HEMOGLOBIN A1C
Est. average glucose Bld gHb Est-mCnc: 120 mg/dL
Hgb A1c MFr Bld: 5.8 % — ABNORMAL HIGH (ref 4.8–5.6)

## 2018-09-18 ENCOUNTER — Ambulatory Visit (INDEPENDENT_AMBULATORY_CARE_PROVIDER_SITE_OTHER): Payer: Medicare HMO | Admitting: Adult Health

## 2018-09-18 ENCOUNTER — Encounter: Payer: Self-pay | Admitting: Adult Health

## 2018-09-18 ENCOUNTER — Other Ambulatory Visit: Payer: Self-pay

## 2018-09-18 VITALS — BP 131/71 | HR 61 | Temp 97.7°F | Ht 62.0 in | Wt 123.8 lb

## 2018-09-18 DIAGNOSIS — Z0000 Encounter for general adult medical examination without abnormal findings: Secondary | ICD-10-CM

## 2018-09-18 DIAGNOSIS — E785 Hyperlipidemia, unspecified: Secondary | ICD-10-CM

## 2018-09-18 DIAGNOSIS — E039 Hypothyroidism, unspecified: Secondary | ICD-10-CM | POA: Diagnosis not present

## 2018-09-18 DIAGNOSIS — R3915 Urgency of urination: Secondary | ICD-10-CM

## 2018-09-18 DIAGNOSIS — Z23 Encounter for immunization: Secondary | ICD-10-CM | POA: Diagnosis not present

## 2018-09-18 LAB — POCT URINALYSIS DIPSTICK
Bilirubin, UA: NEGATIVE
Blood, UA: NEGATIVE
Glucose, UA: NEGATIVE
Ketones, UA: NEGATIVE
Leukocytes, UA: NEGATIVE
Nitrite, UA: NEGATIVE
Protein, UA: NEGATIVE
Spec Grav, UA: 1.01 (ref 1.010–1.025)
Urobilinogen, UA: 0.2 E.U./dL
pH, UA: 6 (ref 5.0–8.0)

## 2018-09-18 MED ORDER — CLOPIDOGREL BISULFATE 75 MG PO TABS: 75.0000 mg | ORAL_TABLET | Freq: Every day | ORAL | 1 refills | Status: DC

## 2018-09-18 MED ORDER — ROSUVASTATIN CALCIUM 20 MG PO TABS: 20.0000 mg | ORAL_TABLET | Freq: Every day | ORAL | 1 refills | Status: DC

## 2018-09-18 MED ORDER — DIAZEPAM 5 MG PO TABS: 5.0000 mg | ORAL_TABLET | Freq: Every evening | ORAL | 0 refills | Status: DC | PRN

## 2018-09-18 NOTE — Progress Notes (Signed)
Subjective:    Patient ID: [name], female    DOB: 1946/01/30, 73 y.o.   MRN: [id number]  HPI:  [name] is here for CPE Last Cardiology OV 04/2018-no change to medications, f/u scheduled for August 2020  Per Neuro hypertension with blood pressure goal below 130/90, diabetes with hemoglobin A1c goal below 6.5% and lipids with LDL cholesterol goal below 70 mg/dL. I  09/15/2018 Labs: TSH-WNL, 4.140  Lipid Panel-  Tot-147  TGs-90  HDL-62  LDL-67 - AT GOAL A1c-5.8  CBC-WNL  CMP-WNL   Healthcare Maintenance: PAP- Mammogram- Colonoscopy- Immunizations-Pneumo Dexa Scan-declined  Patient Care Team    Relationship Specialty Notifications Start End  Esaw Grandchild, NP PCP - General Family Medicine  05/29/16   End, Harrell Gave, MD PCP - Cardiology Cardiology Admissions 04/05/17   Almedia Balls, MD Consulting Physician Orthopedic Surgery  05/29/16   Garvin Fila, MD Consulting Physician Neurology  05/29/16   Milus Banister, MD Attending Physician Gastroenterology  05/29/16   Ronnell Freshwater, MD Referring Physician Ophthalmology  05/29/16     Patient Active Problem List   Diagnosis Date Noted  . Epigastric pain 03/03/2018  . Coronary artery disease of native artery of native heart with stable angina pectoris ([place]) 05/23/2017  . Peripheral vascular disease ([place]) 05/23/2017  . Vaginal dryness 02/25/2017  . Depression, recurrent ([place]) 02/25/2017  . Stable angina ([place]) 01/08/2017  . History of stroke 01/08/2017  . Accelerating angina ([place]) 11/17/2016  . Subclavian artery stenosis, left ([place]) 11/17/2016  . Palpitations 11/17/2016  . Hyperlipidemia LDL goal <70 11/17/2016  . Unequal blood pressure in upper extremities 10/22/2016  . Hypotension 08/07/2016  . Health care maintenance 05/29/2016  . Cerebral infarction due to thrombosis of right vertebral artery ([place])   . Cerebral infarction ([place]) 12/26/2014  . Anxiety 12/26/2014  . Hypothyroidism 12/26/2014  . GERD  (gastroesophageal reflux disease) 12/26/2014  . Glaucoma 12/26/2014  . Allergic rhinitis 12/26/2014  . Cerebrovascular accident (CVA) ([place]) 12/26/2014  . Cerebral infarction due to embolism of right vertebral artery ([place])   . Visual field loss   . Occlusion and stenosis of vertebral artery   . Insomnia 03/23/2013  . Morton's neuroma 11/02/2011  . Chronic sinusitis 10/09/2011     Past Medical History:  Diagnosis Date  . Anxiety   . Barrett esophagus 2003  . Diverticulitis   . Diverticulosis   . GERD (gastroesophageal reflux disease)   . Glaucoma   . Grave's disease   . H. pylori infection   . Hepatitis A    in her 65 from food  . Hyperlipidemia   . Hypothyroidism   . Increased pressure in the eye   . Insomnia   . Morton's neuroma   . Murmur   . Scoliosis   . Stroke [facility]) 12/2014   no deficits     Past Surgical History:  Procedure Laterality Date  . AORTIC ARCH ANGIOGRAPHY N/A 01/01/2017   Procedure: AORTIC ARCH ANGIOGRAPHY;  Surgeon: Serafina Mitchell, MD;  Location: [place] [facility];  Service: Cardiovascular;  Laterality: N/A;  . APPENDECTOMY  1958  . CATARACT EXTRACTION Left   . CATARACT EXTRACTION  02/12/2018  . CATARACT EXTRACTION W/PHACO Left 07/02/2016   Procedure: CATARACT EXTRACTION PHACO AND INTRAOCULAR LENS PLACEMENT ([place])  Left;  Surgeon: Ronnell Freshwater, MD;  Location: [place];  Service: Ophthalmology;  Laterality: Left;  . CATARACT EXTRACTION W/PHACO Right 02/12/2018   Procedure: CATARACT EXTRACTION PHACO AND INTRAOCULAR LENS  PLACEMENT ([place])  RIGHT;  Surgeon: Leandrew Koyanagi, MD;  Location: [place];  Service: Ophthalmology;  Laterality: Right;  . CERVICAL LAMINECTOMY  1995  . COLON SURGERY    . EYE SURGERY     due to graves disease  . FEMUR SURGERY Right    benign bone growth  . FOOT NEUROMA SURGERY Right   . INTRAVASCULAR PRESSURE WIRE/FFR STUDY N/A 04/19/2017   Procedure: INTRAVASCULAR PRESSURE WIRE/FFR STUDY;   Surgeon: Nelva Bush, MD;  Location: [place] [facility];  Service: Cardiovascular;  Laterality: N/A;  . LAPAROSCOPIC ASSISTED VAGINAL HYSTERECTOMY    . LAPAROSCOPIC SIGMOID COLECTOMY  05/13/06   Dr Zella Richer  . LEFT HEART CATH AND CORONARY ANGIOGRAPHY N/A 04/19/2017   Procedure: LEFT HEART CATH AND CORONARY ANGIOGRAPHY;  Surgeon: Nelva Bush, MD;  Location: [place] [facility];  Service: Cardiovascular;  Laterality: N/A;  . LUMBAR [place] SURGERY  2009  . PERIPHERAL VASCULAR INTERVENTION Left 01/01/2017   Procedure: PERIPHERAL VASCULAR INTERVENTION;  Surgeon: Serafina Mitchell, MD;  Location: [place] [facility];  Service: Cardiovascular;  Laterality: Left;  . SUBCLAVIAN STENT PLACEMENT    . TONSILLECTOMY  1992  . TRIGGER FINGER RELEASE Right 05/26/2018   Procedure: RIGHT INDEX RELEASE TRIGGER FINGER/A-1 PULLEY;  Surgeon: [name], MD;  Location: [place];  Service: Orthopedics;  Laterality: Right;  . TUBAL LIGATION    . UPPER EXTREMITY ANGIOGRAPHY Left 01/01/2017   Procedure: Upper Extremity Angiography;  Surgeon: Serafina Mitchell, MD;  Location: [place] [facility];  Service: Cardiovascular;  Laterality: Left;     Family History  Problem Relation Age of Onset  . Other Father 9       SEPSIS  . Hypertension Father   . Stroke Father 31       CVA  . Heart failure Mother 36  . Heart murmur Mother   . Heart attack Sister 36  . Diabetes Brother   . Breast cancer Sister   . Stroke Sister   . Cirrhosis Brother   . Hypertension Sister        x 2  . Diabetes Sister   . Heart murmur Daughter      Social History   Substance and Sexual Activity  Drug Use No     Social History   Substance and Sexual Activity  Alcohol Use Yes  . Alcohol/week: 7.0 standard drinks  . Types: 7 Glasses of wine per week     Social History   Tobacco Use  Smoking Status Former Smoker  . Packs/day: 0.25  . Types: Cigarettes  . Quit date: 03/12/1978  . Years since quitting:  40.5  Smokeless Tobacco Never Used  Tobacco Comment   quit in 1978     Outpatient Encounter Medications as of 09/18/2018  Medication Sig Note  . acetaminophen (TYLENOL) 500 MG tablet Take 500 mg by mouth daily as needed for moderate pain or headache.    . Calcium Carbonate-Vitamin D (CALCIUM 600+D PO) Take by mouth daily.   . clopidogrel (PLAVIX) 75 MG tablet Take 1 tablet (75 mg total) by mouth daily.   . diazepam (VALIUM) 5 MG tablet Take 1 tablet (5 mg total) by mouth at bedtime as needed for anxiety (sleep).   . diphenhydrAMINE (BENADRYL) 25 MG tablet Take 25 mg by mouth at bedtime as needed for sleep. 02/12/2018: PRN  . dorzolamide-timolol (COSOPT) 22.3-6.8 MG/ML ophthalmic solution Place into both eyes daily.   . isosorbide mononitrate (IMDUR) 30 MG 24  hr tablet Take 15 mg by mouth daily.   Marland Kitchen latanoprost (XALATAN) 0.005 % ophthalmic solution Place 1 drop into both eyes at bedtime.   Marland Kitchen levothyroxine (SYNTHROID, LEVOTHROID) 88 MCG tablet Take 88 mcg by mouth daily.   . Multiple Vitamin (MULTIVITAMIN) tablet Take 1 tablet by mouth daily.   . pantoprazole (PROTONIX) 40 MG tablet Take 40 mg by mouth daily as needed (indigestion).   . rosuvastatin (CRESTOR) 20 MG tablet Take 1 tablet (20 mg total) by mouth daily.   . [DISCONTINUED] clopidogrel (PLAVIX) 75 MG tablet Take 1 tablet (75 mg total) by mouth daily. PATIENT MUST HAVE OFFICE VISIT PRIOR TO ANY FURTHER REFILLS.   . [DISCONTINUED] diazepam (VALIUM) 5 MG tablet Take 5 mg by mouth at bedtime as needed for anxiety (sleep).    . [DISCONTINUED] rosuvastatin (CRESTOR) 20 MG tablet TAKE 1 TABLET BY MOUTH EVERY DAY   . [DISCONTINUED] traMADol (ULTRAM) 50 MG tablet 1-2 tabs PO q6 hours prn pain    No facility-administered encounter medications on file as of 09/18/2018.     Allergies: Neuromuscular blocking agents, Vicodin [hydrocodone-acetaminophen], and Morphine and related  Body mass index is 22.64 kg/m.  Blood pressure 131/71, pulse 61,  temperature 97.7 F (36.5 C), temperature source Oral, height 5\' 2"  (1.575 m), weight 123 lb 12.8 oz (56.2 kg), SpO2 100 %.     Review of Systems  Constitutional: Positive for fatigue. Negative for activity change, appetite change, chills, diaphoresis, fever and unexpected weight change.  HENT: Negative for congestion.   Eyes: Negative for visual disturbance.  Respiratory: Negative for cough, chest tightness, shortness of breath, wheezing and stridor.   Cardiovascular: Negative for chest pain, palpitations and leg swelling.  Gastrointestinal: Negative for abdominal distention, anal bleeding, blood in stool, constipation, diarrhea, nausea and vomiting.  Endocrine: Negative for cold intolerance, heat intolerance, polydipsia, polyphagia and polyuria.  Genitourinary: Negative for difficulty urinating and flank pain.  Skin: Negative for color change, pallor, rash and wound.  Neurological: Negative for dizziness and headaches.  Hematological: Negative for adenopathy. Does not bruise/bleed easily.  Psychiatric/Behavioral: Negative for agitation, behavioral problems, confusion, decreased concentration, dysphoric mood, hallucinations, self-injury, sleep disturbance and suicidal ideas. The patient is not nervous/anxious and is not hyperactive.        Objective:   Physical Exam Vitals signs and nursing note reviewed.  Constitutional:      General: She is not in acute distress.    Appearance: She is normal weight. She is not ill-appearing, toxic-appearing or diaphoretic.  HENT:     Head: Normocephalic and atraumatic.     Right Ear: Tympanic membrane, ear canal and external ear normal. There is no impacted cerumen.     Left Ear: Tympanic membrane, ear canal and external ear normal.     Nose: Nose normal. No congestion.     Mouth/Throat:     Mouth: Mucous membranes are moist.     Pharynx: No oropharyngeal exudate or posterior oropharyngeal erythema.  Eyes:     Extraocular Movements:  Extraocular movements intact.     Conjunctiva/sclera: Conjunctivae normal.     Pupils: Pupils are equal, round, and reactive to light.  Neck:     Musculoskeletal: Normal range of motion.  Cardiovascular:     Rate and Rhythm: Normal rate and regular rhythm.     Pulses: Normal pulses.     Heart sounds: Normal heart sounds. No murmur. No friction rub. No gallop.   Pulmonary:     Effort: Pulmonary effort is  normal. No respiratory distress.     Breath sounds: Normal breath sounds. No stridor. No decreased breath sounds, wheezing, rhonchi or rales.  Chest:     Chest wall: No tenderness.     Breasts:        Right: Normal. No swelling, bleeding, inverted nipple, mass, nipple discharge, skin change or tenderness.        Left: Normal. No swelling, bleeding, inverted nipple, mass, nipple discharge, skin change or tenderness.  Abdominal:     General: Abdomen is flat. Bowel sounds are normal. There is no distension.     Palpations: Abdomen is soft. There is no mass.     Tenderness: There is no abdominal tenderness. There is no right CVA tenderness, left CVA tenderness, guarding or rebound.     Hernia: No hernia is present.  Musculoskeletal: Normal range of motion.        General: No swelling, tenderness or signs of injury.     Right lower leg: No edema.     Left lower leg: No edema.  Skin:    General: Skin is warm and dry.     Capillary Refill: Capillary refill takes less than 2 seconds.  Neurological:     Mental Status: She is alert and oriented to person, place, and time.     Coordination: Coordination normal.  Psychiatric:        Mood and Affect: Mood normal.        Behavior: Behavior normal.        Thought Content: Thought content normal.        Judgment: Judgment normal.       Assessment & Plan:   1. Need for pneumococcal vaccination   2. Urinary urgency   3. Health care maintenance   4. Hyperlipidemia LDL goal <70   5. Hypothyroidism, unspecified type     Health care  maintenance Your blood pressure, labs, weight- are all looking fantastic! Continue follow-up with your Cardiologist, Endocrinologist, and Neurologist as directed. Remain well hydrated, follow a heart healthy diet. Recommend following up with your GYN in regards to hormonal changes. If you notice any breast changes or drainage from your nipples, please call clinic and we will send you for diagnostic mammogram. Continue to social distance and wear a mask when in public.  Hyperlipidemia LDL goal <70 Lipid Panel-  Tot-147  TGs-90  HDL-62  LDL-67 - AT GOAL Continue Rosuvastatin 20mg  QD  Hypothyroidism TSH- 4.140 Currently on levothyroxine 56mcg QD    FOLLOW-UP:  Return in about 6 months (around 03/21/2019) for Regular Follow Up.

## 2018-09-18 NOTE — Assessment & Plan Note (Signed)
Lipid Panel-  Tot-147  TGs-90  HDL-62  LDL-67 - AT GOAL Continue Rosuvastatin 20mg  QD

## 2018-09-18 NOTE — Assessment & Plan Note (Signed)
>>  ASSESSMENT AND PLAN FOR HYPOTHYROIDISM WRITTEN ON 09/18/2018  2:00 PM BY DANFORD, KATY D, NP  TSH- 4.140 Currently on levothyroxine 40mg QD

## 2018-09-18 NOTE — Assessment & Plan Note (Signed)
TSH- 4.140 Currently on levothyroxine 57mcg QD

## 2018-09-18 NOTE — Assessment & Plan Note (Signed)
Your blood pressure, labs, weight- are all looking fantastic! Continue follow-up with your Cardiologist, Endocrinologist, and Neurologist as directed. Remain well hydrated, follow a heart healthy diet. Recommend following up with your GYN in regards to hormonal changes. If you notice any breast changes or drainage from your nipples, please call clinic and we will send you for diagnostic mammogram. Continue to social distance and wear a mask when in public.

## 2018-09-18 NOTE — Patient Instructions (Addendum)
Preventive Care for Adults, Female  A healthy lifestyle and preventive care can promote health and wellness. Preventive health guidelines for women include the following key practices.   A routine yearly physical is a good way to check with your health care provider about your health and preventive screening. It is a chance to share any concerns and updates on your health and to receive a thorough exam.   Visit your dentist for a routine exam and preventive care every 6 months. Brush your teeth twice a day and floss once a day. Good oral hygiene prevents tooth decay and gum disease.   The frequency of eye exams is based on your age, health, family medical history, use of contact lenses, and other factors. Follow your health care provider's recommendations for frequency of eye exams.   Eat a healthy diet. Foods like vegetables, fruits, whole grains, low-fat dairy products, and lean protein foods contain the nutrients you need without too many calories. Decrease your intake of foods high in solid fats, added sugars, and salt. Eat the right amount of calories for you.Get information about a proper diet from your health care provider, if necessary.   Regular physical exercise is one of the most important things you can do for your health. Most adults should get at least 150 minutes of moderate-intensity exercise (any activity that increases your heart rate and causes you to sweat) each week. In addition, most adults need muscle-strengthening exercises on 2 or more days a week.   Maintain a healthy weight. The body mass index (BMI) is a screening tool to identify possible weight problems. It provides an estimate of body fat based on height and weight. Your health care provider can find your BMI, and can help you achieve or maintain a healthy weight.For adults 20 years and older:   - A BMI below 18.5 is considered underweight.   - A BMI of 18.5 to 24.9 is normal.   - A BMI of 25 to 29.9 is  considered overweight.   - A BMI of 30 and above is considered obese.   Maintain normal blood lipids and cholesterol levels by exercising and minimizing your intake of trans and saturated fats.  Eat a balanced diet with plenty of fruit and vegetables. Blood tests for lipids and cholesterol should begin at age 20 and be repeated every 5 years minimum.  If your lipid or cholesterol levels are high, you are over 40, or you are at high risk for heart disease, you may need your cholesterol levels checked more frequently.Ongoing high lipid and cholesterol levels should be treated with medicines if diet and exercise are not working.   If you smoke, find out from your health care provider how to quit. If you do not use tobacco, do not start.   Lung cancer screening is recommended for adults aged 55-80 years who are at high risk for developing lung cancer because of a history of smoking. A yearly low-dose CT scan of the lungs is recommended for people who have at least a 30-pack-year history of smoking and are a current smoker or have quit within the past 15 years. A pack year of smoking is smoking an average of 1 pack of cigarettes a day for 1 year (for example: 1 pack a day for 30 years or 2 packs a day for 15 years). Yearly screening should continue until the smoker has stopped smoking for at least 15 years. Yearly screening should be stopped for people who develop a   health problem that would prevent them from having lung cancer treatment.   If you are pregnant, do not drink alcohol. If you are breastfeeding, be very cautious about drinking alcohol. If you are not pregnant and choose to drink alcohol, do not have more than 1 drink per day. One drink is considered to be 12 ounces (355 mL) of beer, 5 ounces (148 mL) of wine, or 1.5 ounces (44 mL) of liquor.   Avoid use of street drugs. Do not share needles with anyone. Ask for help if you need support or instructions about stopping the use of  drugs.   High blood pressure causes heart disease and increases the risk of stroke. Your blood pressure should be checked at least yearly.  Ongoing high blood pressure should be treated with medicines if weight loss and exercise do not work.   If you are 60-66 years old, ask your health care provider if you should take aspirin to prevent strokes.   Diabetes screening involves taking a blood sample to check your fasting blood sugar level. This should be done once every 3 years, after age 32, if you are within normal weight and without risk factors for diabetes. Testing should be considered at a younger age or be carried out more frequently if you are overweight and have at least 1 risk factor for diabetes.   Breast cancer screening is essential preventive care for women. You should practice "breast self-awareness."  This means understanding the normal appearance and feel of your breasts and may include breast self-examination.  Any changes detected, no matter how small, should be reported to a health care provider.  Women in their 26s and 30s should have a clinical breast exam (CBE) by a health care provider as part of a regular health exam every 1 to 3 years.  After age 52, women should have a CBE every year.  Starting at age 83, women should consider having a mammogram (breast X-ray test) every year.  Women who have a family history of breast cancer should talk to their health care provider about genetic screening.  Women at a high risk of breast cancer should talk to their health care providers about having an MRI and a mammogram every year.   -Breast cancer gene (BRCA)-related cancer risk assessment is recommended for women who have family members with BRCA-related cancers. BRCA-related cancers include breast, ovarian, tubal, and peritoneal cancers. Having family members with these cancers may be associated with an increased risk for harmful changes (mutations) in the breast cancer genes BRCA1 and  BRCA2. Results of the assessment will determine the need for genetic counseling and BRCA1 and BRCA2 testing.   The Pap test is a screening test for cervical cancer. A Pap test can show cell changes on the cervix that might become cervical cancer if left untreated. A Pap test is a procedure in which cells are obtained and examined from the lower end of the uterus (cervix).   - Women should have a Pap test starting at age 70.   - Between ages 48 and 30, Pap tests should be repeated every 2 years.   - Beginning at age 70, you should have a Pap test every 3 years as long as the past 3 Pap tests have been normal.   - Some women have medical problems that increase the chance of getting cervical cancer. Talk to your health care provider about these problems. It is especially important to talk to your health care provider if a  new problem develops soon after your last Pap test. In these cases, your health care provider may recommend more frequent screening and Pap tests.   - The above recommendations are the same for women who have or have not gotten the vaccine for human papillomavirus (HPV).   - If you had a hysterectomy for a problem that was not cancer or a condition that could lead to cancer, then you no longer need Pap tests. Even if you no longer need a Pap test, a regular exam is a good idea to make sure no other problems are starting.   - If you are between ages 36 and 66 years, and you have had normal Pap tests going back 10 years, you no longer need Pap tests. Even if you no longer need a Pap test, a regular exam is a good idea to make sure no other problems are starting.   - If you have had past treatment for cervical cancer or a condition that could lead to cancer, you need Pap tests and screening for cancer for at least 20 years after your treatment.   - If Pap tests have been discontinued, risk factors (such as a new sexual partner) need to be reassessed to determine if screening should  be resumed.   - The HPV test is an additional test that may be used for cervical cancer screening. The HPV test looks for the virus that can cause the cell changes on the cervix. The cells collected during the Pap test can be tested for HPV. The HPV test could be used to screen women aged 70 years and older, and should be used in women of any age who have unclear Pap test results. After the age of 67, women should have HPV testing at the same frequency as a Pap test.   Colorectal cancer can be detected and often prevented. Most routine colorectal cancer screening begins at the age of 57 years and continues through age 26 years. However, your health care provider may recommend screening at an earlier age if you have risk factors for colon cancer. On a yearly basis, your health care provider may provide home test kits to check for hidden blood in the stool.  Use of a small camera at the end of a tube, to directly examine the colon (sigmoidoscopy or colonoscopy), can detect the earliest forms of colorectal cancer. Talk to your health care provider about this at age 23, when routine screening begins. Direct exam of the colon should be repeated every 5 -10 years through age 49 years, unless early forms of pre-cancerous polyps or small growths are found.   People who are at an increased risk for hepatitis B should be screened for this virus. You are considered at high risk for hepatitis B if:  -You were born in a country where hepatitis B occurs often. Talk with your health care provider about which countries are considered high risk.  - Your parents were born in a high-risk country and you have not received a shot to protect against hepatitis B (hepatitis B vaccine).  - You have HIV or AIDS.  - You use needles to inject street drugs.  - You live with, or have sex with, someone who has Hepatitis B.  - You get hemodialysis treatment.  - You take certain medicines for conditions like cancer, organ  transplantation, and autoimmune conditions.   Hepatitis C blood testing is recommended for all people born from [birth year] through 1965 and any individual  with known risks for hepatitis C.   Practice safe sex. Use condoms and avoid high-risk sexual practices to reduce the spread of sexually transmitted infections (STIs). STIs include gonorrhea, chlamydia, syphilis, trichomonas, herpes, HPV, and human immunodeficiency virus (HIV). Herpes, HIV, and HPV are viral illnesses that have no cure. They can result in disability, cancer, and death. Sexually active women aged 25 years and younger should be checked for chlamydia. Older women with new or multiple partners should also be tested for chlamydia. Testing for other STIs is recommended if you are sexually active and at increased risk.   Osteoporosis is a disease in which the bones lose minerals and strength with aging. This can result in serious bone fractures or breaks. The risk of osteoporosis can be identified using a bone density scan. Women ages 65 years and over and women at risk for fractures or osteoporosis should discuss screening with their health care providers. Ask your health care provider whether you should take a calcium supplement or vitamin D to There are also several preventive steps women can take to avoid osteoporosis and resulting fractures or to keep osteoporosis from worsening. -->Recommendations include:  Eat a balanced diet high in fruits, vegetables, calcium, and vitamins.  Get enough calcium. The recommended total intake of is 1,200 mg daily; for best absorption, if taking supplements, divide doses into 250-500 mg doses throughout the day. Of the two types of calcium, calcium carbonate is best absorbed when taken with food but calcium citrate can be taken on an empty stomach.  Get enough vitamin D. NAMS and the National Osteoporosis Foundation recommend at least 1,000 IU per day for women age 50 and over who are at risk of vitamin D  deficiency. Vitamin D deficiency can be caused by inadequate sun exposure (for example, those who live in northern latitudes).  Avoid alcohol and smoking. Heavy alcohol intake (more than 7 drinks per week) increases the risk of falls and hip fracture and women smokers tend to lose bone more rapidly and have lower bone mass than nonsmokers. Stopping smoking is one of the most important changes women can make to improve their health and decrease risk for disease.  Be physically active every day. Weight-bearing exercise (for example, fast walking, hiking, jogging, and weight training) may strengthen bones or slow the rate of bone loss that comes with aging. Balancing and muscle-strengthening exercises can reduce the risk of falling and fracture.  Consider therapeutic medications. Currently, several types of effective drugs are available. Healthcare providers can recommend the type most appropriate for each woman.  Eliminate environmental factors that may contribute to accidents. Falls cause nearly 90% of all osteoporotic fractures, so reducing this risk is an important bone-health strategy. Measures include ample lighting, removing obstructions to walking, using nonskid rugs on floors, and placing mats and/or grab bars in showers.  Be aware of medication side effects. Some common medicines make bones weaker. These include a type of steroid drug called glucocorticoids used for arthritis and asthma, some antiseizure drugs, certain sleeping pills, treatments for endometriosis, and some cancer drugs. An overactive thyroid gland or using too much thyroid hormone for an underactive thyroid can also be a problem. If you are taking these medicines, talk to your doctor about what you can do to help protect your bones.reduce the rate of osteoporosis.    Menopause can be associated with physical symptoms and risks. Hormone replacement therapy is available to decrease symptoms and risks. You should talk to your  health care provider   about whether hormone replacement therapy is right for you.   Use sunscreen. Apply sunscreen liberally and repeatedly throughout the day. You should seek shade when your shadow is shorter than you. Protect yourself by wearing long sleeves, pants, a wide-brimmed hat, and sunglasses year round, whenever you are outdoors.   Once a month, do a whole body skin exam, using a mirror to look at the skin on your back. Tell your health care provider of new moles, moles that have irregular borders, moles that are larger than a pencil eraser, or moles that have changed in shape or color.   -Stay current with required vaccines (immunizations).   Influenza vaccine. All adults should be immunized every year.  Tetanus, diphtheria, and acellular pertussis (Td, Tdap) vaccine. Pregnant women should receive 1 dose of Tdap vaccine during each pregnancy. The dose should be obtained regardless of the length of time since the last dose. Immunization is preferred during the 27th 36th week of gestation. An adult who has not previously received Tdap or who does not know her vaccine status should receive 1 dose of Tdap. This initial dose should be followed by tetanus and diphtheria toxoids (Td) booster doses every 10 years. Adults with an unknown or incomplete history of completing a 3-dose immunization series with Td-containing vaccines should begin or complete a primary immunization series including a Tdap dose. Adults should receive a Td booster every 10 years.  Varicella vaccine. An adult without evidence of immunity to varicella should receive 2 doses or a second dose if she has previously received 1 dose. Pregnant females who do not have evidence of immunity should receive the first dose after pregnancy. This first dose should be obtained before leaving the health care facility. The second dose should be obtained 4 8 weeks after the first dose.  Human papillomavirus (HPV) vaccine. Females aged 13 26  years who have not received the vaccine previously should obtain the 3-dose series. The vaccine is not recommended for use in pregnant females. However, pregnancy testing is not needed before receiving a dose. If a female is found to be pregnant after receiving a dose, no treatment is needed. In that case, the remaining doses should be delayed until after the pregnancy. Immunization is recommended for any person with an immunocompromised condition through the age of 26 years if she did not get any or all doses earlier. During the 3-dose series, the second dose should be obtained 4 8 weeks after the first dose. The third dose should be obtained 24 weeks after the first dose and 16 weeks after the second dose.  Zoster vaccine. One dose is recommended for adults aged 60 years or older unless certain conditions are present.  Measles, mumps, and rubella (MMR) vaccine. Adults born before 1957 generally are considered immune to measles and mumps. Adults born in 1957 or later should have 1 or more doses of MMR vaccine unless there is a contraindication to the vaccine or there is laboratory evidence of immunity to each of the three diseases. A routine second dose of MMR vaccine should be obtained at least 28 days after the first dose for students attending postsecondary schools, health care workers, or international travelers. People who received inactivated measles vaccine or an unknown type of measles vaccine during 1963 1967 should receive 2 doses of MMR vaccine. People who received inactivated mumps vaccine or an unknown type of mumps vaccine before 1979 and are at high risk for mumps infection should consider immunization with 2 doses of   MMR vaccine. For females of childbearing age, rubella immunity should be determined. If there is no evidence of immunity, females who are not pregnant should be vaccinated. If there is no evidence of immunity, females who are pregnant should delay immunization until after pregnancy.  Unvaccinated health care workers born before [birth year] who lack laboratory evidence of measles, mumps, or rubella immunity or laboratory confirmation of disease should consider measles and mumps immunization with 2 doses of MMR vaccine or rubella immunization with 1 dose of MMR vaccine.  Pneumococcal 13-valent conjugate (PCV13) vaccine. When indicated, a person who is uncertain of her immunization history and has no record of immunization should receive the PCV13 vaccine. An adult aged 54 years or older who has certain medical conditions and has not been previously immunized should receive 1 dose of PCV13 vaccine. This PCV13 should be followed with a dose of pneumococcal polysaccharide (PPSV23) vaccine. The PPSV23 vaccine dose should be obtained at least 8 weeks after the dose of PCV13 vaccine. An adult aged 58 years or older who has certain medical conditions and previously received 1 or more doses of PPSV23 vaccine should receive 1 dose of PCV13. The PCV13 vaccine dose should be obtained 1 or more years after the last PPSV23 vaccine dose.  Pneumococcal polysaccharide (PPSV23) vaccine. When PCV13 is also indicated, PCV13 should be obtained first. All adults aged 58 years and older should be immunized. An adult younger than age 65 years who has certain medical conditions should be immunized. Any person who resides in a nursing home or long-term care facility should be immunized. An adult smoker should be immunized. People with an immunocompromised condition and certain other conditions should receive both PCV13 and PPSV23 vaccines. People with human immunodeficiency virus (HIV) infection should be immunized as soon as possible after diagnosis. Immunization during chemotherapy or radiation therapy should be avoided. Routine use of PPSV23 vaccine is not recommended for American Indians, [place] Natives, or people younger than 65 years unless there are medical conditions that require PPSV23 vaccine. When indicated,  people who have unknown immunization and have no record of immunization should receive PPSV23 vaccine. One-time revaccination 5 years after the first dose of PPSV23 is recommended for people aged 70 64 years who have chronic kidney failure, nephrotic syndrome, asplenia, or immunocompromised conditions. People who received 1 2 doses of PPSV23 before age 32 years should receive another dose of PPSV23 vaccine at age 96 years or later if at least 5 years have passed since the previous dose. Doses of PPSV23 are not needed for people immunized with PPSV23 at or after age 55 years.  Meningococcal vaccine. Adults with asplenia or persistent complement component deficiencies should receive 2 doses of quadrivalent meningococcal conjugate (MenACWY-D) vaccine. The doses should be obtained at least 2 months apart. Microbiologists working with certain meningococcal bacteria, [employer] recruits, people at risk during an outbreak, and people who travel to or live in countries with a high rate of meningitis should be immunized. A first-year college student up through age 58 years who is living in a residence hall should receive a dose if she did not receive a dose on or after her 16th birthday. Adults who have certain high-risk conditions should receive one or more doses of vaccine.  Hepatitis A vaccine. Adults who wish to be protected from this disease, have certain high-risk conditions, work with hepatitis A-infected animals, work in hepatitis A research labs, or travel to or work in countries with a high rate of hepatitis A should be  immunized. Adults who were previously unvaccinated and who anticipate close contact with an international adoptee during the first 60 days after arrival in the [place] from a country with a high rate of hepatitis A should be immunized.  Hepatitis B vaccine.  Adults who wish to be protected from this disease, have certain high-risk conditions, may be exposed to blood or other infectious  body fluids, are household contacts or sex partners of hepatitis B positive people, are clients or workers in certain care facilities, or travel to or work in countries with a high rate of hepatitis B should be immunized.  Haemophilus influenzae type b (Hib) vaccine. A previously unvaccinated person with asplenia or sickle cell disease or having a scheduled splenectomy should receive 1 dose of Hib vaccine. Regardless of previous immunization, a recipient of a hematopoietic stem cell transplant should receive a 3-dose series 6 12 months after her successful transplant. Hib vaccine is not recommended for adults with HIV infection.  Preventive Services / Frequency Ages 6 to 39years  Blood pressure check.** / Every 1 to 2 years.  Lipid and cholesterol check.** / Every 5 years beginning at age 39.  Clinical breast exam.** / Every 3 years for women in their 61s and 62s.  BRCA-related cancer risk assessment.** / For women who have family members with a BRCA-related cancer (breast, ovarian, tubal, or peritoneal cancers).  Pap test.** / Every 2 years from ages 47 through 85. Every 3 years starting at age 34 through age 12 or 74 with a history of 3 consecutive normal Pap tests.  HPV screening.** / Every 3 years from ages 46 through ages 43 to 54 with a history of 3 consecutive normal Pap tests.  Hepatitis C blood test.** / For any individual with known risks for hepatitis C.  Skin self-exam. / Monthly.  Influenza vaccine. / Every year.  Tetanus, diphtheria, and acellular pertussis (Tdap, Td) vaccine.** / Consult your health care provider. Pregnant women should receive 1 dose of Tdap vaccine during each pregnancy. 1 dose of Td every 10 years.  Varicella vaccine.** / Consult your health care provider. Pregnant females who do not have evidence of immunity should receive the first dose after pregnancy.  HPV vaccine. / 3 doses over 6 months, if 64 and younger. The vaccine is not recommended for use in  pregnant females. However, pregnancy testing is not needed before receiving a dose.  Measles, mumps, rubella (MMR) vaccine.** / You need at least 1 dose of MMR if you were born in 1957 or later. You may also need a 2nd dose. For females of childbearing age, rubella immunity should be determined. If there is no evidence of immunity, females who are not pregnant should be vaccinated. If there is no evidence of immunity, females who are pregnant should delay immunization until after pregnancy.  Pneumococcal 13-valent conjugate (PCV13) vaccine.** / Consult your health care provider.  Pneumococcal polysaccharide (PPSV23) vaccine.** / 1 to 2 doses if you smoke cigarettes or if you have certain conditions.  Meningococcal vaccine.** / 1 dose if you are age 71 to 37 years and a Market researcher living in a residence hall, or have one of several medical conditions, you need to get vaccinated against meningococcal disease. You may also need additional booster doses.  Hepatitis A vaccine.** / Consult your health care provider.  Hepatitis B vaccine.** / Consult your health care provider.  Haemophilus influenzae type b (Hib) vaccine.** / Consult your health care provider.  Ages 55 to 64years  Blood pressure check.** / Every 1 to 2 years.  Lipid and cholesterol check.** / Every 5 years beginning at age 20 years.  Lung cancer screening. / Every year if you are aged 55 80 years and have a 30-pack-year history of smoking and currently smoke or have quit within the past 15 years. Yearly screening is stopped once you have quit smoking for at least 15 years or develop a health problem that would prevent you from having lung cancer treatment.  Clinical breast exam.** / Every year after age 40 years.  BRCA-related cancer risk assessment.** / For women who have family members with a BRCA-related cancer (breast, ovarian, tubal, or peritoneal cancers).  Mammogram.** / Every year beginning at age 40  years and continuing for as long as you are in good health. Consult with your health care provider.  Pap test.** / Every 3 years starting at age 30 years through age 65 or 70 years with a history of 3 consecutive normal Pap tests.  HPV screening.** / Every 3 years from ages 30 years through ages 65 to 70 years with a history of 3 consecutive normal Pap tests.  Fecal occult blood test (FOBT) of stool. / Every year beginning at age 50 years and continuing until age 75 years. You may not need to do this test if you get a colonoscopy every 10 years.  Flexible sigmoidoscopy or colonoscopy.** / Every 5 years for a flexible sigmoidoscopy or every 10 years for a colonoscopy beginning at age 50 years and continuing until age 75 years.  Hepatitis C blood test.** / For all people born from 1945 through 1965 and any individual with known risks for hepatitis C.  Skin self-exam. / Monthly.  Influenza vaccine. / Every year.  Tetanus, diphtheria, and acellular pertussis (Tdap/Td) vaccine.** / Consult your health care provider. Pregnant women should receive 1 dose of Tdap vaccine during each pregnancy. 1 dose of Td every 10 years.  Varicella vaccine.** / Consult your health care provider. Pregnant females who do not have evidence of immunity should receive the first dose after pregnancy.  Zoster vaccine.** / 1 dose for adults aged 60 years or older.  Measles, mumps, rubella (MMR) vaccine.** / You need at least 1 dose of MMR if you were born in 1957 or later. You may also need a 2nd dose. For females of childbearing age, rubella immunity should be determined. If there is no evidence of immunity, females who are not pregnant should be vaccinated. If there is no evidence of immunity, females who are pregnant should delay immunization until after pregnancy.  Pneumococcal 13-valent conjugate (PCV13) vaccine.** / Consult your health care provider.  Pneumococcal polysaccharide (PPSV23) vaccine.** / 1 to 2 doses if  you smoke cigarettes or if you have certain conditions.  Meningococcal vaccine.** / Consult your health care provider.  Hepatitis A vaccine.** / Consult your health care provider.  Hepatitis B vaccine.** / Consult your health care provider.  Haemophilus influenzae type b (Hib) vaccine.** / Consult your health care provider.  Ages 65 years and over  Blood pressure check.** / Every 1 to 2 years.  Lipid and cholesterol check.** / Every 5 years beginning at age 20 years.  Lung cancer screening. / Every year if you are aged 55 80 years and have a 30-pack-year history of smoking and currently smoke or have quit within the past 15 years. Yearly screening is stopped once you have quit smoking for at least 15 years or develop a health problem that   would prevent you from having lung cancer treatment.  Clinical breast exam.** / Every year after age 55 years.  BRCA-related cancer risk assessment.** / For women who have family members with a BRCA-related cancer (breast, ovarian, tubal, or peritoneal cancers).  Mammogram.** / Every year beginning at age 83 years and continuing for as long as you are in good health. Consult with your health care provider.  Pap test.** / Every 3 years starting at age 10 years through age 59 or 25 years with 3 consecutive normal Pap tests. Testing can be stopped between 65 and 70 years with 3 consecutive normal Pap tests and no abnormal Pap or HPV tests in the past 10 years.  HPV screening.** / Every 3 years from ages 38 years through ages 71 or 54 years with a history of 3 consecutive normal Pap tests. Testing can be stopped between 65 and 70 years with 3 consecutive normal Pap tests and no abnormal Pap or HPV tests in the past 10 years.  Fecal occult blood test (FOBT) of stool. / Every year beginning at age 74 years and continuing until age 49 years. You may not need to do this test if you get a colonoscopy every 10 years.  Flexible sigmoidoscopy or colonoscopy.** /  Every 5 years for a flexible sigmoidoscopy or every 10 years for a colonoscopy beginning at age 67 years and continuing until age 17 years.  Hepatitis C blood test.** / For all people born from [birth year] through 1966 and any individual with known risks for hepatitis C.  Osteoporosis screening.** / A one-time screening for women ages 37 years and over and women at risk for fractures or osteoporosis.  Skin self-exam. / Monthly.  Influenza vaccine. / Every year.  Tetanus, diphtheria, and acellular pertussis (Tdap/Td) vaccine.** / 1 dose of Td every 10 years.  Varicella vaccine.** / Consult your health care provider.  Zoster vaccine.** / 1 dose for adults aged 47 years or older.  Pneumococcal 13-valent conjugate (PCV13) vaccine.** / Consult your health care provider.  Pneumococcal polysaccharide (PPSV23) vaccine.** / 1 dose for all adults aged 69 years and older.  Meningococcal vaccine.** / Consult your health care provider.  Hepatitis A vaccine.** / Consult your health care provider.  Hepatitis B vaccine.** / Consult your health care provider.  Haemophilus influenzae type b (Hib) vaccine.** / Consult your health care provider. ** Family history and personal history of risk and conditions may change your health care provider's recommendations. Document Released: 04/24/2001 Document Revised: 12/17/2012  [facility] Patient Information 2014 [place], [place].   EXERCISE AND DIET:  We recommended that you start or continue a regular exercise program for good health. Regular exercise means any activity that makes your heart beat faster and makes you sweat.  We recommend exercising at least 30 minutes per day at least 3 days a week, preferably 5.  We also recommend a diet low in fat and sugar / carbohydrates.  Inactivity, poor dietary choices and obesity can cause diabetes, heart attack, stroke, and kidney damage, among others.     ALCOHOL AND SMOKING:  Women should limit their alcohol intake to no  more than 7 drinks/beers/glasses of wine (combined, not each!) per week. Moderation of alcohol intake to this level decreases your risk of breast cancer and liver damage.  ( And of course, no recreational drugs are part of a healthy lifestyle.)  Also, you should not be smoking at all or even being exposed to second hand smoke. Most people know smoking can  cause cancer, and various heart and lung diseases, but did you know it also contributes to weakening of your bones?  Aging of your skin?  Yellowing of your teeth and nails?   CALCIUM AND VITAMIN D:  Adequate intake of calcium and Vitamin D are recommended.  The recommendations for exact amounts of these supplements seem to change often, but generally speaking 600 mg of calcium (either carbonate or citrate) and 800 units of Vitamin D per day seems prudent. Certain women may benefit from higher intake of Vitamin D.  If you are among these women, your doctor will have told you during your visit.     PAP SMEARS:  Pap smears, to check for cervical cancer or precancers,  have traditionally been done yearly, although recent scientific advances have shown that most women can have pap smears less often.  However, every woman still should have a physical exam from her gynecologist or primary care physician every year. It will include a breast check, inspection of the vulva and vagina to check for abnormal growths or skin changes, a visual exam of the cervix, and then an exam to evaluate the size and shape of the uterus and ovaries.  And after 74 years of age, a rectal exam is indicated to check for rectal cancers. We will also provide age appropriate advice regarding health maintenance, like when you should have certain vaccines, screening for sexually transmitted diseases, bone density testing, colonoscopy, mammograms, etc.    MAMMOGRAMS:  All women over 6 years old should have a yearly mammogram. Many facilities now offer a "3D" mammogram, which may cost  around $50 extra out of pocket. If possible,  we recommend you accept the option to have the 3D mammogram performed.  It both reduces the number of women who will be called back for extra views which then turn out to be normal, and it is better than the routine mammogram at detecting truly abnormal areas.     COLONOSCOPY:  Colonoscopy to screen for colon cancer is recommended for all women at age 63.  We know, you hate the idea of the prep.  We agree, BUT, having colon cancer and not knowing it is worse!!  Colon cancer so often starts as a polyp that can be seen and removed at colonscopy, which can quite literally save your life!  And if your first colonoscopy is normal and you have no family history of colon cancer, most women don't have to have it again for 10 years.  Once every ten years, you can do something that may end up saving your life, right?  We will be happy to help you get it scheduled when you are ready.  Be sure to check your insurance coverage so you understand how much it will cost.  It may be covered as a preventative service at no cost, but you should check your particular policy.   Your blood pressure, labs, weight- are all looking fantastic! Continue follow-up with your Cardiologist, Endocrinologist, and Neurologist as directed. Remain well hydrated, follow a heart healthy diet. Recommend following up with your GYN in regards to hormonal changes. If you notice any breast changes or drainage from your nipples, please call clinic and we will send you for diagnostic mammogram. Continue to social distance and wear a mask when in public. Follow-up 6 months. GREAT TO SEE YOU!

## 2018-09-22 DIAGNOSIS — E890 Postprocedural hypothyroidism: Secondary | ICD-10-CM | POA: Diagnosis not present

## 2018-09-23 ENCOUNTER — Telehealth: Payer: Self-pay | Admitting: Adult Health

## 2018-09-23 NOTE — Telephone Encounter (Signed)
Called pt to set up CPE for ( Rx Refill ) ,see she just had an OV w/ provider 7/9 & provider doesn't hv any available spots for CPE until Sept 2020 will there be denial of meds because of late date CPE?  --Forwarding message to medical assistant for advice.  -glh

## 2018-09-23 NOTE — Telephone Encounter (Signed)
Advised Annabell Sabal, receptionist within our office, that pt can have CPE in September and refills will be provided until that appt.  Charyl Bigger, CMA

## 2018-09-25 DIAGNOSIS — H401132 Primary open-angle glaucoma, bilateral, moderate stage: Secondary | ICD-10-CM | POA: Diagnosis not present

## 2018-09-30 DIAGNOSIS — H401132 Primary open-angle glaucoma, bilateral, moderate stage: Secondary | ICD-10-CM | POA: Diagnosis not present

## 2018-10-08 DIAGNOSIS — H26492 Other secondary cataract, left eye: Secondary | ICD-10-CM | POA: Diagnosis not present

## 2018-10-15 DIAGNOSIS — H0012 Chalazion right lower eyelid: Secondary | ICD-10-CM | POA: Diagnosis not present

## 2018-10-28 DIAGNOSIS — E119 Type 2 diabetes mellitus without complications: Secondary | ICD-10-CM | POA: Insufficient documentation

## 2018-10-28 DIAGNOSIS — R7303 Prediabetes: Secondary | ICD-10-CM | POA: Insufficient documentation

## 2018-11-07 ENCOUNTER — Ambulatory Visit: Payer: Medicare HMO | Admitting: Internal Medicine

## 2018-11-18 ENCOUNTER — Other Ambulatory Visit: Payer: Self-pay | Admitting: Adult Health

## 2018-11-18 DIAGNOSIS — Z1231 Encounter for screening mammogram for malignant neoplasm of breast: Secondary | ICD-10-CM

## 2018-11-27 ENCOUNTER — Ambulatory Visit (INDEPENDENT_AMBULATORY_CARE_PROVIDER_SITE_OTHER): Payer: Medicare HMO | Admitting: Internal Medicine

## 2018-11-27 ENCOUNTER — Other Ambulatory Visit: Payer: Self-pay

## 2018-11-27 ENCOUNTER — Encounter: Payer: Self-pay | Admitting: Internal Medicine

## 2018-11-27 VITALS — BP 112/60 | HR 70 | Ht 62.0 in | Wt 125.5 lb

## 2018-11-27 DIAGNOSIS — I739 Peripheral vascular disease, unspecified: Secondary | ICD-10-CM

## 2018-11-27 DIAGNOSIS — E785 Hyperlipidemia, unspecified: Secondary | ICD-10-CM | POA: Diagnosis not present

## 2018-11-27 DIAGNOSIS — Z8673 Personal history of transient ischemic attack (TIA), and cerebral infarction without residual deficits: Secondary | ICD-10-CM | POA: Diagnosis not present

## 2018-11-27 DIAGNOSIS — I25118 Atherosclerotic heart disease of native coronary artery with other forms of angina pectoris: Secondary | ICD-10-CM | POA: Diagnosis not present

## 2018-11-27 NOTE — Patient Instructions (Signed)
Medication Instructions:  Your physician recommends that you continue on your current medications as directed. Please refer to the Current Medication list given to you today.  If you need a refill on your cardiac medications before your next appointment, please call your pharmacy.   Lab work: None ordered  If you have labs (blood work) drawn today and your tests are completely normal, you will receive your results only by: Marland Kitchen MyChart Message (if you have MyChart) OR . A paper copy in the mail If you have any lab test that is abnormal or we need to change your treatment, we will call you to review the results.  Testing/Procedures: None ordered   Follow-Up: At [facility], you and your health needs are our priority.  As part of our continuing mission to provide you with exceptional heart care, we have created designated Provider Care Teams.  These Care Teams include your primary Cardiologist (physician) and Advanced Practice Providers (APPs -  Physician Assistants and Nurse Practitioners) who all work together to provide you with the care you need, when you need it. You will need a follow up appointment in 12 months.  Please call our office 2 months in advance to schedule this appointment.  You may see Nelva Bush, MD or one of the following Advanced Practice Providers on your designated Care Team:   Murray Hodgkins, NP Christell Faith, PA-C Marrianne Mood, PA-C

## 2018-11-27 NOTE — Progress Notes (Signed)
Follow-up Outpatient Visit Date: 11/27/2018  Primary Care Provider: Esaw Grandchild, NP [place] [id number]  Chief Complaint: Follow-up chest pain  HPI:  [name] is a 73 y.o. year-old female with history of nonobstructive CAD, peripheral vascular disease with right vertebral artery occlusion and left subclavian artery stenosis status post stenting (12/2016),stroke in 2016, hyperlipidemia, hypothyroidism, and GERD, who presents for follow-up of coronary artery disease.  She was last seen in our office in late February by Christell Faith, PA, for preop assessment in anticipation of trigger finger release.  She was doing well from a heart standpoint without chest pain, shortness of breath, or dizziness.  Today, [name] reports that she continues to feel quite well.  She feels like the isosorbide mononitrate is really helping with her exertional chest tightness.  She has not had any since beginning the medication.  She denies shortness of breath, palpitations, lightheadedness, and edema.  She has recovered well from her right hand trigger finger surgery.  She remains on clopidogrel without bleeding, given history of prior stroke and left subclavian artery stent.  She does not have any pain or cramping in her left arm.  --------------------------------------------------------------------------------------------------  Cardiovascular History & Procedures: Cardiovascular Problems:  Peripheral vascular disease  Stroke  Stable angina with nonobstructive coronary artery disease  Risk Factors:  Peripheral and cerebrovascular disease, hyperlipidemia, and age greater than 47  Cath/PCI:  LHC (04/19/17): LMCA normal. LAD with sequential 50% and 25% lesions (FFR 0.92). Ramus with minimal disease. LCx normal. RCA with mild diffuse disease. LVEDP 17 mmHg. LVEF 55-65%.  CV Surgery:  Aortography and left subclavian angiography; intervention (01/01/17, Dr. Trula Slade): Occluded right  vertebral artery. 80% left subclavian artery stenosis with successful intervention with Xpress 8 x 25 mm balloon-expandable stent.  EP Procedures and Devices:  30-day event monitor (11/28/16): Sinus rhythm. No significant arrhythmia.  Non-Invasive Evaluation(s):  Carotid Doppler (02/17/18): 1-39% stenosis of both internal carotid arteries.  Right vertebral artery is occluded.  Left vertebral artery shows antegrade flow.  No significant subclavian artery stenosis bilaterally.  Exercise MPI (11/22/16): Low risk study with small in size, mild in severity, fixed apical anterior and apical defect likely representing apical thinning. LVEF >65% with normal wall motion. Patient complained of fatigue during test, exercising 5:30 minutes.  Upper extremity arterial Doppler (10/26/16): Right upper extremity evaluation normal. Left upper extremity demonstrates dampened biphasic to monophasic waveforms and 20 mmHg pressure difference compared to the right.  Carotid artery duplex (10/18/16): Bilateral 1-39% stenoses involving the right and left internal carotid arteries.  Transthoracic echocardiogram (12/28/14): Normal LV size and function. LVEF 60-65% with grade 1 diastolic dysfunction. Normal RV size and function. No significant vascular abnormalities.  Recent CV Pertinent Labs: Lab Results  Component Value Date   CHOL 147 09/15/2018   HDL 62 09/15/2018   LDLCALC 67 09/15/2018   TRIG 90 09/15/2018   CHOLHDL 2.4 09/15/2018   CHOLHDL 3.9 12/27/2014   INR 1.0 04/12/2017   K 4.9 09/15/2018   BUN 12 09/15/2018   CREATININE 0.72 09/15/2018    Past medical and surgical history were reviewed and updated in EPIC.  Current Meds  Medication Sig  . acetaminophen (TYLENOL) 500 MG tablet Take 500 mg by mouth daily as needed for moderate pain or headache.   . Calcium Carbonate-Vitamin D (CALCIUM 600+D PO) Take by mouth daily.  . clopidogrel (PLAVIX) 75 MG tablet Take 1 tablet (75 mg total) by mouth daily.  .  diazepam (VALIUM)  5 MG tablet Take 1 tablet (5 mg total) by mouth at bedtime as needed for anxiety (sleep).  . diphenhydrAMINE (BENADRYL) 25 MG tablet Take 25 mg by mouth at bedtime as needed for sleep.  . dorzolamide-timolol (COSOPT) 22.3-6.8 MG/ML ophthalmic solution Place into both eyes daily.  . isosorbide mononitrate (IMDUR) 30 MG 24 hr tablet Take 15 mg by mouth daily.  Marland Kitchen latanoprost (XALATAN) 0.005 % ophthalmic solution Place 1 drop into both eyes at bedtime.  Marland Kitchen levothyroxine (SYNTHROID) 75 MCG tablet Take 75 mcg by mouth daily before breakfast.  . Multiple Vitamin (MULTIVITAMIN) tablet Take 1 tablet by mouth daily.  . pantoprazole (PROTONIX) 40 MG tablet Take 40 mg by mouth daily as needed (indigestion).  . rosuvastatin (CRESTOR) 20 MG tablet Take 1 tablet (20 mg total) by mouth daily.    Allergies: Neuromuscular blocking agents, Vicodin [hydrocodone-acetaminophen], and Morphine and related  Social History   Tobacco Use  . Smoking status: Former Smoker    Packs/day: 0.25    Types: Cigarettes    Quit date: 03/12/1978    Years since quitting: 40.7  . Smokeless tobacco: Never Used  . Tobacco comment: quit in 1978  Substance Use Topics  . Alcohol use: Yes    Alcohol/week: 7.0 standard drinks    Types: 7 Glasses of wine per week  . Drug use: No    Family History  Problem Relation Age of Onset  . Other Father 78       SEPSIS  . Hypertension Father   . Stroke Father 42       CVA  . Heart failure Mother 76  . Heart murmur Mother   . Heart attack Sister 54  . Diabetes Brother   . Breast cancer Sister   . Stroke Sister   . Cirrhosis Brother   . Hypertension Sister        x 2  . Diabetes Sister   . Heart murmur Daughter     Review of Systems: A 12-system review of systems was performed and was negative except as noted in the HPI.  --------------------------------------------------------------------------------------------------  Physical Exam: BP 112/60 (BP  Location: Left Arm, Patient Position: Sitting, Cuff Size: Normal)   Pulse 70   Ht 5\' 2"  (1.575 m)   Wt 125 lb 8 oz (56.9 kg)   SpO2 96%   BMI 22.95 kg/m   General: NAD. HEENT: No conjunctival pallor or scleral icterus. Neck: Supple without lymphadenopathy, thyromegaly, JVD, or HJR. Lungs: Normal work of breathing. Clear to auscultation bilaterally without wheezes or crackles. Heart: Regular rate and rhythm without murmurs, rubs, or gallops. Non-displaced PMI. Abd: Bowel sounds present. Soft, NT/ND without hepatosplenomegaly Ext: No lower extremity edema. Radial, PT, and DP pulses are 2+ bilaterally. Skin: Warm and dry without rash.  EKG: Normal sinus rhythm with poor R wave progression in V1 and V2.  Low voltage.  Lab Results  Component Value Date   WBC 5.5 09/15/2018   HGB 14.7 09/15/2018   HCT 43.6 09/15/2018   MCV 93 09/15/2018   PLT 227 09/15/2018    Lab Results  Component Value Date   NA 137 09/15/2018   K 4.9 09/15/2018   CL 102 09/15/2018   CO2 22 09/15/2018   BUN 12 09/15/2018   CREATININE 0.72 09/15/2018   GLUCOSE 106 (H) 09/15/2018   ALT 20 09/15/2018    Lab Results  Component Value Date   CHOL 147 09/15/2018   HDL 62 09/15/2018   LDLCALC 67 09/15/2018  TRIG 90 09/15/2018   CHOLHDL 2.4 09/15/2018    --------------------------------------------------------------------------------------------------  ASSESSMENT AND PLAN: Coronary artery disease with stable angina: Ms. Turchetta continues to do quite well without recurrent chest pain in the setting of moderate, nonobstructive CAD.  We will plan to continue her current medication regimen, including isosorbide mononitrate, which has been very helpful for her.  She should remain on indefinite anticoagulation with clopidogrel, given history of CAD, PAD, and stroke.  Peripheral vascular disease: Ms. Weese is status post left subclavian artery stent by Dr. Trula Slade and does not have any arm claudication or  neurologic symptoms to suggest subclavian steal.  She should remain on clopidogrel and follow-up with vascular surgery as previously recommended.  History of stroke: No new neurologic deficits.  Continue secondary prevention with clopidogrel and statin therapy.  Hyperlipidemia: LDL at goal (less than 70).  Continue rosuvastatin 20 mg daily.  Follow-up: Return to clinic in 1 year.  Nelva Bush, MD 11/27/2018 10:29 AM

## 2018-11-28 ENCOUNTER — Encounter: Payer: Self-pay | Admitting: Internal Medicine

## 2018-12-05 DIAGNOSIS — R69 Illness, unspecified: Secondary | ICD-10-CM | POA: Diagnosis not present

## 2018-12-30 ENCOUNTER — Ambulatory Visit
Admission: RE | Admit: 2018-12-30 | Discharge: 2018-12-30 | Disposition: A | Discharge status: Home / Self Care | Payer: Medicare HMO | Source: Ambulatory Visit | Attending: Adult Health | Admitting: Adult Health

## 2018-12-30 ENCOUNTER — Other Ambulatory Visit: Payer: Self-pay

## 2018-12-30 DIAGNOSIS — Z1231 Encounter for screening mammogram for malignant neoplasm of breast: Secondary | ICD-10-CM

## 2019-01-19 DIAGNOSIS — Z0100 Encounter for examination of eyes and vision without abnormal findings: Secondary | ICD-10-CM | POA: Diagnosis not present

## 2019-03-19 ENCOUNTER — Ambulatory Visit: Payer: Medicare HMO | Admitting: Adult Health

## 2019-03-19 ENCOUNTER — Ambulatory Visit: Payer: Medicare HMO | Admitting: Family Medicine

## 2019-03-25 ENCOUNTER — Telehealth: Payer: Self-pay

## 2019-03-25 ENCOUNTER — Other Ambulatory Visit: Payer: Self-pay | Admitting: Adult Health

## 2019-03-25 NOTE — Telephone Encounter (Signed)
Please call pt to schedule f/u appt.  No further refills until pt is seen.  T. Louiza Moor, CMA 

## 2019-04-01 ENCOUNTER — Telehealth (HOSPITAL_COMMUNITY): Payer: Self-pay

## 2019-04-01 ENCOUNTER — Other Ambulatory Visit: Payer: Self-pay

## 2019-04-01 DIAGNOSIS — I6529 Occlusion and stenosis of unspecified carotid artery: Secondary | ICD-10-CM

## 2019-04-01 NOTE — Telephone Encounter (Signed)

## 2019-04-01 NOTE — Progress Notes (Signed)
History of Present Illness:  Patient is a 74 y.o. year old female who presents for evaluation of carotid stenosis with follow up carotid duplex surveillance.  S/P arteriogram on 01/01/2017. A greater than 80% stenosis was identified in the proximal portion of the left subclavian, proximal to the vertebral artery. This was treated with primary stenting using 8 x 25 balloon expandable stent.    She denise amaurosis, weakness in extremities or other symptoms of recent TIA or stroke.    She does have a history of strokein 2016secondary to a right vertebral artery occlusion.She had a left eye visual changes with her stroke in October 2016. She is managed for hypercholesterolemia with a statin. She is a former smoker.    Past Medical History:  Diagnosis Date  . Anxiety   . Barrett esophagus 2003  . Diverticulitis   . Diverticulosis   . GERD (gastroesophageal reflux disease)   . Glaucoma   . Grave's disease   . H. pylori infection   . Hepatitis A    in her 66 from food  . Hyperlipidemia   . Hypothyroidism   . Increased pressure in the eye   . Insomnia   . Morton's neuroma   . Murmur   . Scoliosis   . Stroke [facility]) 12/2014   no deficits    Past Surgical History:  Procedure Laterality Date  . AORTIC ARCH ANGIOGRAPHY N/A 01/01/2017   Procedure: AORTIC ARCH ANGIOGRAPHY;  Surgeon: Serafina Mitchell, MD;  Location: [place] [facility];  Service: Cardiovascular;  Laterality: N/A;  . APPENDECTOMY  1958  . CATARACT EXTRACTION Left   . CATARACT EXTRACTION  02/12/2018  . CATARACT EXTRACTION W/PHACO Left 07/02/2016   Procedure: CATARACT EXTRACTION PHACO AND INTRAOCULAR LENS PLACEMENT ([place])  Left;  Surgeon: Ronnell Freshwater, MD;  Location: [place];  Service: Ophthalmology;  Laterality: Left;  . CATARACT EXTRACTION W/PHACO Right 02/12/2018   Procedure: CATARACT EXTRACTION PHACO AND INTRAOCULAR LENS PLACEMENT ([place])  RIGHT;  Surgeon: Leandrew Koyanagi, MD;   Location: [place];  Service: Ophthalmology;  Laterality: Right;  . CERVICAL LAMINECTOMY  1995  . COLON SURGERY    . EYE SURGERY     due to graves disease  . FEMUR SURGERY Right    benign bone growth  . FOOT NEUROMA SURGERY Right   . INTRAVASCULAR PRESSURE WIRE/FFR STUDY N/A 04/19/2017   Procedure: INTRAVASCULAR PRESSURE WIRE/FFR STUDY;  Surgeon: Nelva Bush, MD;  Location: [place] [facility];  Service: Cardiovascular;  Laterality: N/A;  . LAPAROSCOPIC ASSISTED VAGINAL HYSTERECTOMY    . LAPAROSCOPIC SIGMOID COLECTOMY  05/13/06   Dr Zella Richer  . LEFT HEART CATH AND CORONARY ANGIOGRAPHY N/A 04/19/2017   Procedure: LEFT HEART CATH AND CORONARY ANGIOGRAPHY;  Surgeon: Nelva Bush, MD;  Location: [place] [facility];  Service: Cardiovascular;  Laterality: N/A;  . LUMBAR [place] SURGERY  2009  . PERIPHERAL VASCULAR INTERVENTION Left 01/01/2017   Procedure: PERIPHERAL VASCULAR INTERVENTION;  Surgeon: Serafina Mitchell, MD;  Location: [place] [facility];  Service: Cardiovascular;  Laterality: Left;  . SUBCLAVIAN STENT PLACEMENT    . TONSILLECTOMY  1992  . TRIGGER FINGER RELEASE Right 05/26/2018   Procedure: RIGHT INDEX RELEASE TRIGGER FINGER/A-1 PULLEY;  Surgeon: [name], MD;  Location: [place];  Service: Orthopedics;  Laterality: Right;  . TUBAL LIGATION    . UPPER EXTREMITY ANGIOGRAPHY Left 01/01/2017   Procedure: Upper Extremity Angiography;  Surgeon: Serafina Mitchell, MD;  Location: [place] [facility];  Service: Cardiovascular;  Laterality: Left;     Social History Social History   Tobacco Use  . Smoking status: Former Smoker    Packs/day: 0.25    Types: Cigarettes    Quit date: 03/12/1978    Years since quitting: 41.0  . Smokeless tobacco: Never Used  . Tobacco comment: quit in 1978  Substance Use Topics  . Alcohol use: Yes    Alcohol/week: 7.0 standard drinks    Types: 7 Glasses of wine per week  . Drug use: No    Family History Family  History  Problem Relation Age of Onset  . Other Father 86       SEPSIS  . Hypertension Father   . Stroke Father 62       CVA  . Heart failure Mother 78  . Heart murmur Mother   . Heart attack Sister 73  . Diabetes Brother   . Breast cancer Sister 34  . Stroke Sister   . Cirrhosis Brother   . Hypertension Sister        x 2  . Diabetes Sister   . Heart murmur Daughter   . Breast cancer Maternal Aunt   . Breast cancer Paternal Aunt   . Breast cancer Cousin   . Breast cancer Cousin   . Breast cancer Cousin   . Breast cancer Cousin     Allergies  Allergies  Allergen Reactions  . Neuromuscular Blocking Agents Other (See Comments)    Multiple symptoms, SOB, fatigue, weakness  . Vicodin [Hydrocodone-Acetaminophen] Other (See Comments)    Altered mental state  . Morphine And Related Rash     Current Outpatient Medications  Medication Sig Dispense Refill  . acetaminophen (TYLENOL) 500 MG tablet Take 500 mg by mouth daily as needed for moderate pain or headache.     . Calcium Carbonate-Vitamin D (CALCIUM 600+D PO) Take by mouth daily.    . clopidogrel (PLAVIX) 75 MG tablet Take 1 tablet (75 mg total) by mouth daily. OFFICE VISIT REQUIRED PRIOR TO ANY FURTHER REFILLS 30 tablet 0  . diazepam (VALIUM) 5 MG tablet Take 1 tablet (5 mg total) by mouth at bedtime as needed for anxiety (sleep). 30 tablet 0  . diphenhydrAMINE (BENADRYL) 25 MG tablet Take 25 mg by mouth at bedtime as needed for sleep.    . dorzolamide-timolol (COSOPT) 22.3-6.8 MG/ML ophthalmic solution Place into both eyes daily.    . isosorbide mononitrate (IMDUR) 30 MG 24 hr tablet Take 15 mg by mouth daily.    Marland Kitchen latanoprost (XALATAN) 0.005 % ophthalmic solution Place 1 drop into both eyes at bedtime.    Marland Kitchen levothyroxine (SYNTHROID) 75 MCG tablet Take 75 mcg by mouth daily before breakfast.    . Multiple Vitamin (MULTIVITAMIN) tablet Take 1 tablet by mouth daily.    . pantoprazole (PROTONIX) 40 MG tablet Take 40 mg by  mouth daily as needed (indigestion).    . rosuvastatin (CRESTOR) 20 MG tablet Take 1 tablet (20 mg total) by mouth daily. 90 tablet 1   No current facility-administered medications for this visit.    ROS:   General:  No weight loss, Fever, chills  HEENT: No recent headaches, no nasal bleeding, no visual changes, no sore throat  Neurologic: No dizziness, blackouts, seizures. No recent symptoms of stroke or mini- stroke. No recent episodes of slurred speech, or temporary blindness.  Cardiac: No recent episodes of chest pain/pressure, no shortness of breath at rest.  No  shortness of breath with exertion.  Denies history of atrial fibrillation or irregular heartbeat  Vascular: No history of rest pain in feet.  No history of claudication.  No history of non-healing ulcer, No history of DVT   Pulmonary: No home oxygen, no productive cough, no hemoptysis,  No asthma or wheezing  Musculoskeletal:  [ ]  Arthritis, [ ]  Low back pain,  [ ]  Joint pain  Hematologic:No history of hypercoagulable state.  No history of easy bleeding.  No history of anemia  Gastrointestinal: No hematochezia or melena,  No gastroesophageal reflux, no trouble swallowing  Urinary: [ ]  chronic Kidney disease, [ ]  on HD - [ ]  MWF or [ ]  TTHS, [ ]  Burning with urination, [ ]  Frequent urination, [ ]  Difficulty urinating;   Skin: No rashes  Psychological: No history of anxiety,  No history of depression   Physical Examination  Vitals:   04/02/19 1146 04/02/19 1147  BP: 121/72 119/72  Pulse: 73   Resp: 18   Temp: 98.5 F (36.9 C)   TempSrc: Oral   SpO2: 97%   Weight: 126 lb 11.2 oz (57.5 kg)   Height: 5\' 2"  (1.575 m)     Body mass index is 23.17 kg/m.  General:  Alert and oriented, no acute distress HEENT: Normal Neck: No bruit or JVD Pulmonary: Clear to auscultation bilaterally Cardiac: Regular Rate and Rhythm without murmur Gastrointestinal: Soft, non-tender, non-distended, no mass, no scars Skin: No  rash Extremity Pulses:  2+ radial, brachial, femoral, dorsalis pedis, posterior tibial pulses bilaterally Musculoskeletal: No deformity or edema  Neurologic: Upper and lower extremity motor 5/5 and symmetric  DATA:    Right Carotid Findings: +----------+--------+--------+--------+------------------+--------+           PSV cm/sEDV cm/sStenosisPlaque DescriptionComments +----------+--------+--------+--------+------------------+--------+ CCA Prox  83      14                                         +----------+--------+--------+--------+------------------+--------+ CCA Mid   96      19              homogeneous                +----------+--------+--------+--------+------------------+--------+ CCA Distal94      16              homogeneous                +----------+--------+--------+--------+------------------+--------+ ICA Prox  85      18      1-39%   heterogenous               +----------+--------+--------+--------+------------------+--------+ ICA Mid   70      22                                         +----------+--------+--------+--------+------------------+--------+ ICA Distal73      23                                         +----------+--------+--------+--------+------------------+--------+ ECA       123     4                                          +----------+--------+--------+--------+------------------+--------+  +----------+--------+-------+----------------+-------------------+  PSV cm/sEDV cmsDescribe        Arm Pressure (mmHG) +----------+--------+-------+----------------+-------------------+ [id number]      4      Multiphasic, WNL                    +----------+--------+-------+----------------+-------------------+  +---------+--------+--------+------+ VertebralPSV cm/sEDV cm/sAbsent +---------+--------+--------+------+     Left Carotid  Findings: +----------+--------+--------+--------+------------------+--------+           PSV cm/sEDV cm/sStenosisPlaque DescriptionComments +----------+--------+--------+--------+------------------+--------+ CCA Prox  91      20              heterogenous               +----------+--------+--------+--------+------------------+--------+ CCA Mid   109     22              heterogenous               +----------+--------+--------+--------+------------------+--------+ CCA Distal91      21              homogeneous                +----------+--------+--------+--------+------------------+--------+ ICA Prox  69      18      1-39%                              +----------+--------+--------+--------+------------------+--------+ ICA Mid   76      21                                         +----------+--------+--------+--------+------------------+--------+ ICA Distal71      21                                         +----------+--------+--------+--------+------------------+--------+ ECA       67      8                                          +----------+--------+--------+--------+------------------+--------+  +----------+--------+--------+------------+-------------------+           PSV cm/sEDV cm/sDescribe    Arm Pressure (mmHG) +----------+--------+--------+------------+-------------------+ [id number]      0       patent stent                    +----------+--------+--------+------------+-------------------+  +---------+--------+--+--------+--+---------+ VertebralPSV cm/s50EDV cm/s14Antegrade +---------+--------+--+--------+--+---------+  Right Brachial 63 cm/s, left Brachial 58 cm/s, triphasic waveforms.  Left Stent(s): +---------------+--+-++++ Proximal Stent 910 +---------------+--+-++++ Mid Stent      980 +---------------+--+-++++ Distal Stent   960 +---------------+--+-++++ Distal to  Stent490 +---------------+--+-++++           Summary: Right Carotid: Velocities in the right ICA are consistent with a 1-39% stenosis.  Left Carotid: Velocities in the left ICA are consistent with a 1-39% stenosis.  Vertebrals:  Left vertebral artery demonstrates antegrade flow. Right vertebral              artery demonstrates no discernable flow. The Left subclavian stent              is patent. Subclavians: Normal flow hemodynamics were seen in bilateral subclavian  arteries.   ASSESSMENT:  S/P Stent left subclavian artery by Dr. Trula Slade on 01/01/2017   PLAN: She is asymptomatic and has equal BP in B UE.  She denise TIA or stroke symptoms, no numbness or motor loss in left UE.    F/U in 1 year for repeat carotid and subclavian duplex surveillance.  We reviewed the signs and symptoms of stroke and if she has these she will call 911.  She continues to take daily Plavix and Statin.   Roxy Horseman PA-C [facility] Office: [phone]  MD in clinic [place]

## 2019-04-02 ENCOUNTER — Ambulatory Visit (HOSPITAL_COMMUNITY)
Admission: RE | Admit: 2019-04-02 | Discharge: 2019-04-02 | Disposition: A | Discharge status: Home / Self Care | Payer: Medicare HMO | Source: Ambulatory Visit | Attending: Vascular Surgery | Admitting: Vascular Surgery

## 2019-04-02 ENCOUNTER — Other Ambulatory Visit: Payer: Self-pay

## 2019-04-02 ENCOUNTER — Ambulatory Visit: Payer: Medicare HMO | Admitting: Physician Assistant

## 2019-04-02 VITALS — BP 119/72 | HR 73 | Temp 98.5°F | Resp 18 | Ht 62.0 in | Wt 126.7 lb

## 2019-04-02 DIAGNOSIS — I6529 Occlusion and stenosis of unspecified carotid artery: Secondary | ICD-10-CM | POA: Diagnosis not present

## 2019-04-02 DIAGNOSIS — I771 Stricture of artery: Secondary | ICD-10-CM | POA: Diagnosis not present

## 2019-04-03 ENCOUNTER — Other Ambulatory Visit: Payer: Self-pay | Admitting: *Deleted

## 2019-04-03 DIAGNOSIS — I6529 Occlusion and stenosis of unspecified carotid artery: Secondary | ICD-10-CM

## 2019-04-14 ENCOUNTER — Telehealth: Payer: Self-pay | Admitting: Adult Health

## 2019-04-14 DIAGNOSIS — R350 Frequency of micturition: Secondary | ICD-10-CM | POA: Diagnosis not present

## 2019-04-14 DIAGNOSIS — N3941 Urge incontinence: Secondary | ICD-10-CM | POA: Diagnosis not present

## 2019-04-14 NOTE — Telephone Encounter (Signed)
Called pt to set up provider required OV ---Left message w/ husband for pt to cb to scheduled-- Rx refills are pending for an OV or Telehealth.  ---FYI  to med asst  --glh

## 2019-04-20 ENCOUNTER — Encounter: Payer: Self-pay | Admitting: Adult Health

## 2019-04-20 ENCOUNTER — Other Ambulatory Visit: Payer: Self-pay | Admitting: Adult Health

## 2019-04-20 ENCOUNTER — Ambulatory Visit (INDEPENDENT_AMBULATORY_CARE_PROVIDER_SITE_OTHER): Payer: Medicare HMO | Admitting: Adult Health

## 2019-04-20 ENCOUNTER — Other Ambulatory Visit: Payer: Self-pay

## 2019-04-20 VITALS — BP 111/66 | HR 71 | Temp 97.9°F | Ht 62.0 in | Wt 131.1 lb

## 2019-04-20 DIAGNOSIS — Z0000 Encounter for general adult medical examination without abnormal findings: Secondary | ICD-10-CM

## 2019-04-20 DIAGNOSIS — E785 Hyperlipidemia, unspecified: Secondary | ICD-10-CM

## 2019-04-20 DIAGNOSIS — R6881 Early satiety: Secondary | ICD-10-CM

## 2019-04-20 DIAGNOSIS — K219 Gastro-esophageal reflux disease without esophagitis: Secondary | ICD-10-CM | POA: Diagnosis not present

## 2019-04-20 DIAGNOSIS — R1013 Epigastric pain: Secondary | ICD-10-CM | POA: Diagnosis not present

## 2019-04-20 DIAGNOSIS — Z7901 Long term (current) use of anticoagulants: Secondary | ICD-10-CM

## 2019-04-20 DIAGNOSIS — I639 Cerebral infarction, unspecified: Secondary | ICD-10-CM | POA: Diagnosis not present

## 2019-04-20 MED ORDER — CLOPIDOGREL BISULFATE 75 MG PO TABS: 75.0000 mg | ORAL_TABLET | Freq: Every day | ORAL | 0 refills | Status: DC

## 2019-04-20 NOTE — Patient Instructions (Addendum)
Gallbladder Nuclear Scan  A gallbladder nuclear scan (hepatobiliary scan or HIDA scan) is an imaging test that checks the function of your liver, gallbladder, and the ducts of the liver and gallbladder. These parts make up the hepatobiliary system. You may need this scan if you have symptoms of liver or gallbladder disease. For this exam, a radioactive substance (radiotracer) will be injected into your bloodstream through an IV. As the radiotracer moves through your hepatobiliary system, a camera will detect the energy that the radiotracer gives off. The camera will produce images that show how well your hepatobiliary system is functioning. Tell a health care provider about:  Any allergies you have.  All medicines you are taking, including vitamins, herbs, eye drops, creams, and over-the-counter medicines.  Any problems you or your family members have had with anesthetic medicines.  Any blood disorders you have.  Any surgeries you have had.  Any medical conditions you have.  Whether you are pregnant, may be pregnant, or are breastfeeding. What are the risks?  Getting a gallbladder nuclear scan is a safe procedure. However, you will be exposed to a small amount of radiation.  There is a risk of an allergic reaction to medicines used during the test. What happens before the procedure? General instructions  Follow instructions from your health care provider about eating or drinking restrictions. You may not be able to eat or drink anything for 4 hours before the test.  Ask your health care provider about changing or stopping your regular medicines. What happens during the procedure?   You will lie down on your back.  An IV will be inserted into a vein in your hand or arm.  Radiotracer will be injected through your IV. You may feel a cold sensation as this happens.  A camera (gamma camera) will be moved over your abdomen.  Images will be taken. The camera may rotate around your  body. You may be asked to stay still or move into a certain position.  You may be given a medicine (cholecystokinin, CCK) through your IV. This medicine will make your gallbladder empty. You may feel some nausea or have cramping for a short time.  More images will be taken.  After all images have been taken, your IV will be removed. The procedure may vary among health care providers and hospitals. What happens after the procedure?  You should drink plenty of fluids to help your body get rid of the radiotracer.  It is up to you to get your test results. Ask your health care provider, or the department that is doing the test, when your results will be ready. Summary  A gallbladder nuclear scan (hepatobiliary scan or HIDA scan) is an imaging test that checks the function of your liver, gallbladder, and the ducts of the liver and gallbladder.  You may need this scan if you have symptoms of liver or gallbladder disease.  For this exam, a radioactive substance (radiotracer) will be injected into your bloodstream through an IV.  The camera will produce images that show how well your hepatobiliary system is functioning. This information is not intended to replace advice given to you by your health care provider. Make sure you discuss any questions you have with your health care provider. Document Revised: 03/08/2017 Document Reviewed: 03/08/2017 Elsevier Patient Education  2020 [name] all medications as directed. Remain well hydrated, follow Mediterranean diet. We will contact you with lab results. Referral to [facility] placed, someone from that  practice will call you to schedule an appt. Imaging ordered to address abdominal pain and early satiety. Continue to social distance and wear a mask when in public. Follow-up in 6 months- [employer] [facility] with fasting labs. GREAT TO SEE YOU!

## 2019-04-20 NOTE — Assessment & Plan Note (Signed)
Chronic anticoagulation- clopidogrel without bleeding/unusual bruising- history of prior stroke and left subclavian artery stent. CMP/CBC drawn today

## 2019-04-20 NOTE — Assessment & Plan Note (Addendum)
Pantoprazole 40mg  every other day Remote hx of H Pylori infection Avoid spicy/acidic foods Referral to [facility] placed

## 2019-04-20 NOTE — Assessment & Plan Note (Signed)
Rosuvastatin 20mg  QD CMP drawn She is quite active, follows heart healthy diet

## 2019-04-20 NOTE — Assessment & Plan Note (Signed)
Continue all medications as directed. Remain well hydrated, follow Mediterranean diet. We will contact you with lab results. Referral to [facility] placed, someone from that practice will call you to schedule an appt. Imaging ordered to address abdominal pain and early satiety. Continue to social distance and wear a mask when in public. Follow-up in 6 months- [facility] with fasting labs.

## 2019-04-20 NOTE — Progress Notes (Signed)
Subjective:    Patient ID: [name], female    DOB: Aug 08, 1945, 74 y.o.   MRN: [id number]  HPI: 7//2020 OV: [name] is here for CPE Last Cardiology OV 04/2018-no change to medications, f/u scheduled for August 2020  Per Neuro hypertension with blood pressure goal below 130/90, diabetes with hemoglobin A1c goal below 6.5% and lipids with LDL cholesterol goal below 70 mg/dL. I  09/15/2018 Labs: TSH-WNL, 4.140  Lipid Panel-  Tot-147  TGs-90  HDL-62  LDL-67 - AT GOAL A1c-5.8  CBC-WNL  CMP-WNL   04/20/2019 OV:  [name] is here for regular f/u: Pre-diabetes, PVD, Recurrent Depression, Hypothyroidism, Insomnia  She reports ambulatory BP  SBP 110-120s DBP 70 She denies acute cardiac sx's  Last OV with Cards 11/27/2018- annual f/u  Exertional CP well controlled on  isosorbide mononitrate.  Chronic anticoagulation- clopidogrel without bleeding/unusual bruising- history of prior stroke and left subclavian artery stent.  Per cards recommendation- LDL goal <70, last LDL 67 09/15/2018- she is currently on Rosuvastatin 20mg  QD- tolerating well   She has acute issue today- Epigastric pain, early satiety, bloating/gas after eating. She denies N/V/D/C She denies fever. She denies abdominal pain without eating. She reports her mother and sister both has hx  of cholecystitis. She has remote hx of H Pylori infection. She has previously been treated by [facility]- will refer back to that practice. Will order HIDA SCAN during interim.  Patient Care Team    Relationship Specialty Notifications Start End  Esaw Grandchild, NP PCP - General Family Medicine  05/29/16   End, Harrell Gave, MD PCP - Cardiology Cardiology Admissions 04/05/17   Almedia Balls, MD Consulting Physician Orthopedic Surgery  05/29/16   Garvin Fila, MD Consulting Physician Neurology  05/29/16   Milus Banister, MD Attending Physician Gastroenterology  05/29/16   Ronnell Freshwater, MD Referring Physician  Ophthalmology  05/29/16     Patient Active Problem List   Diagnosis Date Noted  . Prediabetes 10/28/2018  . Epigastric pain 03/03/2018  . Coronary artery disease of native artery of native heart with stable angina pectoris ([place]) 05/23/2017  . Peripheral vascular disease ([place]) 05/23/2017  . Vaginal dryness 02/25/2017  . Depression, recurrent ([place]) 02/25/2017  . Stable angina ([place]) 01/08/2017  . History of stroke 01/08/2017  . Accelerating angina ([place]) 11/17/2016  . Subclavian artery stenosis, left ([place]) 11/17/2016  . Palpitations 11/17/2016  . Hyperlipidemia LDL goal <70 11/17/2016  . Unequal blood pressure in upper extremities 10/22/2016  . Hypotension 08/07/2016  . Health care maintenance 05/29/2016  . Cerebral infarction due to thrombosis of right vertebral artery ([place])   . Cerebral infarction ([place]) 12/26/2014  . Anxiety 12/26/2014  . Hypothyroidism 12/26/2014  . GERD (gastroesophageal reflux disease) 12/26/2014  . Glaucoma 12/26/2014  . Allergic rhinitis 12/26/2014  . Cerebrovascular accident (CVA) ([place]) 12/26/2014  . Cerebral infarction due to embolism of right vertebral artery ([place])   . Visual field loss   . Occlusion and stenosis of vertebral artery   . Insomnia 03/23/2013  . Morton's neuroma 11/02/2011  . Chronic sinusitis 10/09/2011     Past Medical History:  Diagnosis Date  . Anxiety   . Barrett esophagus 2003  . Diverticulitis   . Diverticulosis   . GERD (gastroesophageal reflux disease)   . Glaucoma   . Grave's disease   . H. pylori infection   . Hepatitis A    in her 52 from food  . Hyperlipidemia   . Hypothyroidism   .  Increased pressure in the eye   . Insomnia   . Morton's neuroma   . Murmur   . Scoliosis   . Stroke [facility]) 12/2014   no deficits     Past Surgical History:  Procedure Laterality Date  . AORTIC ARCH ANGIOGRAPHY N/A 01/01/2017   Procedure: AORTIC ARCH ANGIOGRAPHY;  Surgeon: Serafina Mitchell, MD;  Location: [place] [facility];   Service: Cardiovascular;  Laterality: N/A;  . APPENDECTOMY  1958  . CATARACT EXTRACTION Left   . CATARACT EXTRACTION  02/12/2018  . CATARACT EXTRACTION W/PHACO Left 07/02/2016   Procedure: CATARACT EXTRACTION PHACO AND INTRAOCULAR LENS PLACEMENT ([place])  Left;  Surgeon: Ronnell Freshwater, MD;  Location: [place];  Service: Ophthalmology;  Laterality: Left;  . CATARACT EXTRACTION W/PHACO Right 02/12/2018   Procedure: CATARACT EXTRACTION PHACO AND INTRAOCULAR LENS PLACEMENT ([place])  RIGHT;  Surgeon: Leandrew Koyanagi, MD;  Location: [place];  Service: Ophthalmology;  Laterality: Right;  . CERVICAL LAMINECTOMY  1995  . COLON SURGERY    . EYE SURGERY     due to graves disease  . FEMUR SURGERY Right    benign bone growth  . FOOT NEUROMA SURGERY Right   . INTRAVASCULAR PRESSURE WIRE/FFR STUDY N/A 04/19/2017   Procedure: INTRAVASCULAR PRESSURE WIRE/FFR STUDY;  Surgeon: Nelva Bush, MD;  Location: [place] [facility];  Service: Cardiovascular;  Laterality: N/A;  . LAPAROSCOPIC ASSISTED VAGINAL HYSTERECTOMY    . LAPAROSCOPIC SIGMOID COLECTOMY  05/13/06   Dr Zella Richer  . LEFT HEART CATH AND CORONARY ANGIOGRAPHY N/A 04/19/2017   Procedure: LEFT HEART CATH AND CORONARY ANGIOGRAPHY;  Surgeon: Nelva Bush, MD;  Location: [place] [facility];  Service: Cardiovascular;  Laterality: N/A;  . LUMBAR [place] SURGERY  2009  . PERIPHERAL VASCULAR INTERVENTION Left 01/01/2017   Procedure: PERIPHERAL VASCULAR INTERVENTION;  Surgeon: Serafina Mitchell, MD;  Location: [place] [facility];  Service: Cardiovascular;  Laterality: Left;  . SUBCLAVIAN STENT PLACEMENT    . TONSILLECTOMY  1992  . TRIGGER FINGER RELEASE Right 05/26/2018   Procedure: RIGHT INDEX RELEASE TRIGGER FINGER/A-1 PULLEY;  Surgeon: [name], MD;  Location: [place];  Service: Orthopedics;  Laterality: Right;  . TUBAL LIGATION    . UPPER EXTREMITY ANGIOGRAPHY Left 01/01/2017   Procedure: Upper  Extremity Angiography;  Surgeon: Serafina Mitchell, MD;  Location: [place] [facility];  Service: Cardiovascular;  Laterality: Left;     Family History  Problem Relation Age of Onset  . Other Father 74       SEPSIS  . Hypertension Father   . Stroke Father 23       CVA  . Heart failure Mother 8  . Heart murmur Mother   . Heart attack Sister 69  . Diabetes Brother   . Breast cancer Sister 72  . Stroke Sister   . Cirrhosis Brother   . Hypertension Sister        x 2  . Diabetes Sister   . Heart murmur Daughter   . Breast cancer Maternal Aunt   . Breast cancer Paternal Aunt   . Breast cancer Cousin   . Breast cancer Cousin   . Breast cancer Cousin   . Breast cancer Cousin      Social History   Substance and Sexual Activity  Drug Use No     Social History   Substance and Sexual Activity  Alcohol Use Yes  . Alcohol/week: 7.0 standard drinks  . Types: 7 Glasses  of wine per week     Social History   Tobacco Use  Smoking Status Former Smoker  . Packs/day: 0.25  . Types: Cigarettes  . Quit date: 03/12/1978  . Years since quitting: 41.1  Smokeless Tobacco Never Used  Tobacco Comment   quit in 1978     Outpatient Encounter Medications as of 04/20/2019  Medication Sig Note  . acetaminophen (TYLENOL) 500 MG tablet Take 500 mg by mouth daily as needed for moderate pain or headache.    . Calcium Carbonate-Vitamin D (CALCIUM 600+D PO) Take by mouth daily.   . clopidogrel (PLAVIX) 75 MG tablet Take 1 tablet (75 mg total) by mouth daily. OFFICE VISIT REQUIRED PRIOR TO ANY FURTHER REFILLS   . diazepam (VALIUM) 5 MG tablet Take 1 tablet (5 mg total) by mouth at bedtime as needed for anxiety (sleep).   . diphenhydrAMINE (BENADRYL) 25 MG tablet Take 25 mg by mouth at bedtime as needed for sleep. 02/12/2018: PRN  . dorzolamide-timolol (COSOPT) 22.3-6.8 MG/ML ophthalmic solution Place into both eyes daily.   . isosorbide mononitrate (IMDUR) 30 MG 24 hr tablet Take 15 mg by mouth  daily.   Marland Kitchen latanoprost (XALATAN) 0.005 % ophthalmic solution Place 1 drop into both eyes at bedtime.   Marland Kitchen levothyroxine (SYNTHROID) 75 MCG tablet Take 75 mcg by mouth daily before breakfast.   . Multiple Vitamin (MULTIVITAMIN) tablet Take 1 tablet by mouth daily.   . pantoprazole (PROTONIX) 40 MG tablet Take 40 mg by mouth daily as needed (indigestion).   . rosuvastatin (CRESTOR) 20 MG tablet Take 1 tablet (20 mg total) by mouth daily.   . [DISCONTINUED] clopidogrel (PLAVIX) 75 MG tablet Take 1 tablet (75 mg total) by mouth daily. OFFICE VISIT REQUIRED PRIOR TO ANY FURTHER REFILLS    No facility-administered encounter medications on file as of 04/20/2019.    Allergies: Neuromuscular blocking agents, Vicodin [hydrocodone-acetaminophen], and Morphine and related  Body mass index is 23.98 kg/m.  Blood pressure 111/66, pulse 71, temperature 97.9 F (36.6 C), temperature source Oral, height 5\' 2"  (1.575 m), weight 131 lb 1.6 oz (59.5 kg), SpO2 97 %.     Review of Systems  Constitutional: Positive for appetite change and fatigue. Negative for activity change, chills, diaphoresis, fever and unexpected weight change.  HENT: Negative for congestion.   Eyes: Negative for visual disturbance.  Respiratory: Negative for cough, chest tightness, shortness of breath, wheezing and stridor.   Cardiovascular: Negative for chest pain, palpitations and leg swelling.  Gastrointestinal: Positive for abdominal pain. Negative for abdominal distention, blood in stool, constipation, diarrhea, nausea and vomiting.  Endocrine: Negative for polydipsia, polyphagia and polyuria.  Genitourinary: Positive for urgency. Negative for difficulty urinating, dysuria and flank pain.  Neurological: Negative for dizziness and headaches.  Hematological: Negative for adenopathy. Does not bruise/bleed easily.  Psychiatric/Behavioral: Negative for agitation, behavioral problems, confusion, decreased concentration, dysphoric mood,  hallucinations, self-injury, sleep disturbance and suicidal ideas. The patient is not nervous/anxious and is not hyperactive.        Objective:   Physical Exam Vitals and nursing note reviewed.  Constitutional:      General: She is not in acute distress.    Appearance: Normal appearance. She is normal weight. She is not ill-appearing, toxic-appearing or diaphoretic.  HENT:     Head: Normocephalic and atraumatic.  Eyes:     Conjunctiva/sclera: Conjunctivae normal.     Pupils: Pupils are equal, round, and reactive to light.  Cardiovascular:     Rate and  Rhythm: Normal rate.     Pulses: Normal pulses.     Heart sounds: Normal heart sounds. No murmur. No friction rub. No gallop.   Pulmonary:     Effort: Pulmonary effort is normal. No respiratory distress.     Breath sounds: Normal breath sounds. No wheezing, rhonchi or rales.  Chest:     Chest wall: No tenderness.  Abdominal:     General: Abdomen is flat. Bowel sounds are normal.     Palpations: Abdomen is soft. There is no hepatomegaly.     Tenderness: There is no abdominal tenderness. There is no right CVA tenderness, left CVA tenderness, guarding or rebound. Negative signs include Murphy's sign, Rovsing's sign and McBurney's sign.  Neurological:     Mental Status: She is alert.       Assessment & Plan:   1. Chronic anticoagulation   2. Epigastric pain   3. Early satiety   4. Hyperlipidemia LDL goal <70   5. Health care maintenance   6. Gastroesophageal reflux disease, unspecified whether esophagitis present   7. Cerebrovascular accident (CVA), unspecified mechanism ([place])     Epigastric pain Family hx of cholecystitis. Referral to [facility] placed, someone from that practice will call you to schedule an appt. HIDA SCAN ordered to address abdominal pain and early satiety.   Hyperlipidemia LDL goal <70 Rosuvastatin 20mg  QD CMP drawn She is quite active, follows heart healthy diet   Health care  maintenance Continue all medications as directed. Remain well hydrated, follow Mediterranean diet. We will contact you with lab results. Referral to [facility] placed, someone from that practice will call you to schedule an appt. Imaging ordered to address abdominal pain and early satiety. Continue to social distance and wear a mask when in public. Follow-up in 6 months- Medicare Wellness with fasting labs.  GERD (gastroesophageal reflux disease) Pantoprazole 40mg  every other day Remote hx of H Pylori infection Avoid spicy/acidic foods Referral to [facility] placed   Cerebrovascular accident (CVA) ([place]) Chronic anticoagulation- clopidogrel without bleeding/unusual bruising- history of prior stroke and left subclavian artery stent. CMP/CBC drawn today     FOLLOW-UP:  Return in about 6 months (around 10/18/2019) for Medical Wellness.

## 2019-04-20 NOTE — Assessment & Plan Note (Signed)
Family hx of cholecystitis. Referral to [facility] placed, someone from that practice will call you to schedule an appt. HIDA SCAN ordered to address abdominal pain and early satiety.

## 2019-04-21 ENCOUNTER — Encounter: Payer: Self-pay | Admitting: Adult Health

## 2019-04-21 LAB — CBC
Hematocrit: 43.5 % (ref 34.0–46.6)
Hemoglobin: 14.8 g/dL (ref 11.1–15.9)
MCH: 31.4 pg (ref 26.6–33.0)
MCHC: 34 g/dL (ref 31.5–35.7)
MCV: 92 fL (ref 79–97)
Platelets: 244 10*3/uL (ref 150–450)
RBC: 4.71 x10E6/uL (ref 3.77–5.28)
RDW: 11.8 % (ref 11.7–15.4)
WBC: 5.2 10*3/uL (ref 3.4–10.8)

## 2019-04-21 LAB — COMPREHENSIVE METABOLIC PANEL
ALT: 26 IU/L (ref 0–32)
AST: 27 IU/L (ref 0–40)
Albumin/Globulin Ratio: 2.3 — ABNORMAL HIGH (ref 1.2–2.2)
Albumin: 4.8 g/dL — ABNORMAL HIGH (ref 3.7–4.7)
Alkaline Phosphatase: 91 IU/L (ref 39–117)
BUN/Creatinine Ratio: 16 (ref 12–28)
BUN: 12 mg/dL (ref 8–27)
Bilirubin Total: 0.4 mg/dL (ref 0.0–1.2)
CO2: 24 mmol/L (ref 20–29)
Calcium: 9.9 mg/dL (ref 8.7–10.3)
Chloride: 101 mmol/L (ref 96–106)
Creatinine, Ser: 0.76 mg/dL (ref 0.57–1.00)
GFR calc Af Amer: 89 mL/min/{1.73_m2} (ref >59)
GFR calc non Af Amer: 78 mL/min/{1.73_m2} (ref >59)
Globulin, Total: 2.1 g/dL (ref 1.5–4.5)
Glucose: 99 mg/dL (ref 65–99)
Potassium: 5 mmol/L (ref 3.5–5.2)
Sodium: 141 mmol/L (ref 134–144)
Total Protein: 6.9 g/dL (ref 6.0–8.5)

## 2019-04-27 ENCOUNTER — Telehealth: Payer: Self-pay | Admitting: Adult Health

## 2019-04-27 ENCOUNTER — Ambulatory Visit: Payer: Medicare HMO | Admitting: Adult Health

## 2019-04-27 NOTE — Telephone Encounter (Signed)
Attempted to call patient with no answer. Unable to leave voicemail. AS, CMA 

## 2019-04-27 NOTE — Telephone Encounter (Signed)
Patient was seen last week for abd pain, and was advised that we would place a GI referral and abd [place] order. I see the GI referral but no abd [place]. Can I get an order for this to send to imaging please?

## 2019-04-28 NOTE — Telephone Encounter (Signed)
Per Dr. Erskin Burnet the in office abdominal exam was normal, unless patient is having extreme pain, since she has a scheduled apt with GI on 05/11/19 she should be evaluated there and if they think she needs a abd [place] then we can order at that time. Patient is aware of this and verbalized understanding. AS, CMA

## 2019-05-05 DIAGNOSIS — H0013 Chalazion right eye, unspecified eyelid: Secondary | ICD-10-CM | POA: Diagnosis not present

## 2019-05-05 DIAGNOSIS — H401132 Primary open-angle glaucoma, bilateral, moderate stage: Secondary | ICD-10-CM | POA: Diagnosis not present

## 2019-05-06 DIAGNOSIS — E890 Postprocedural hypothyroidism: Secondary | ICD-10-CM | POA: Diagnosis not present

## 2019-05-11 ENCOUNTER — Ambulatory Visit: Payer: Medicare HMO | Admitting: Gastroenterology

## 2019-05-13 IMAGING — MG DIGITAL SCREENING BILATERAL MAMMOGRAM WITH TOMO AND CAD
8 series · 9 of 24 positions shown · non-contrast
Comparison: Previous exam(s).

CLINICAL DATA: Screening.

EXAM:
DIGITAL SCREENING BILATERAL MAMMOGRAM WITH TOMO AND CAD

[R CC synth-2D]
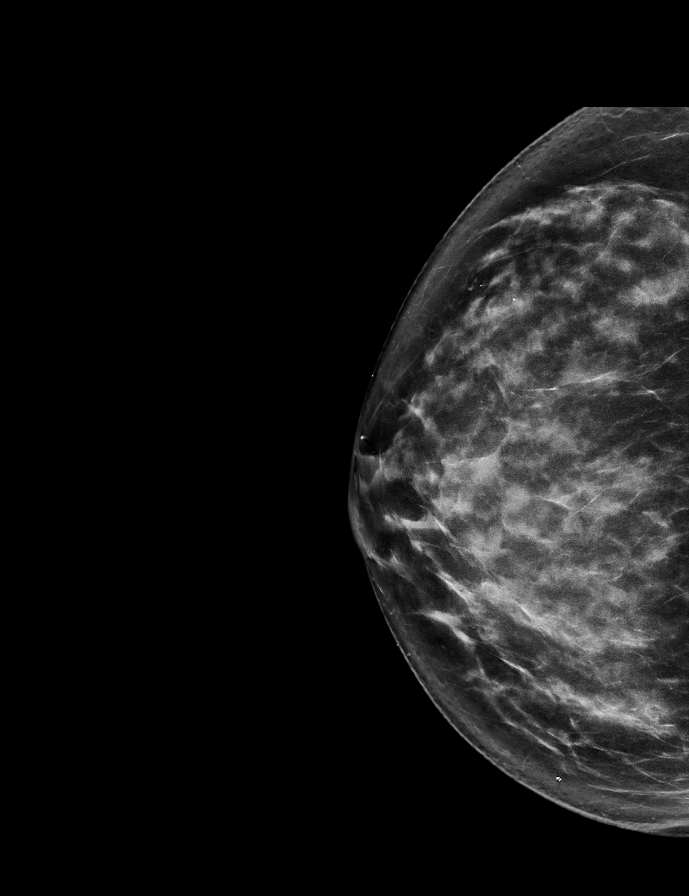

[R MLO synth-2D]
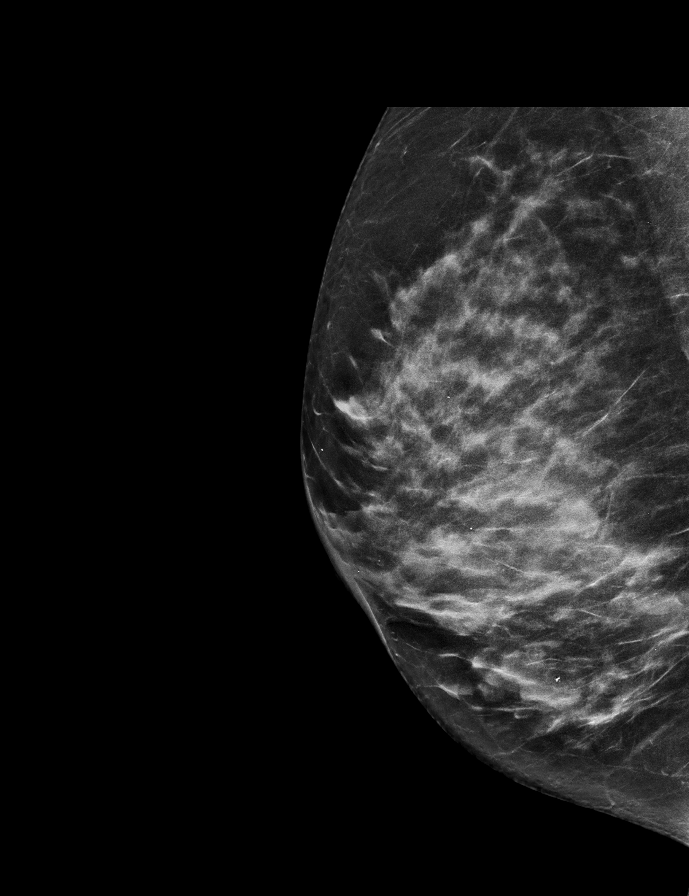

[L CC synth-2D]
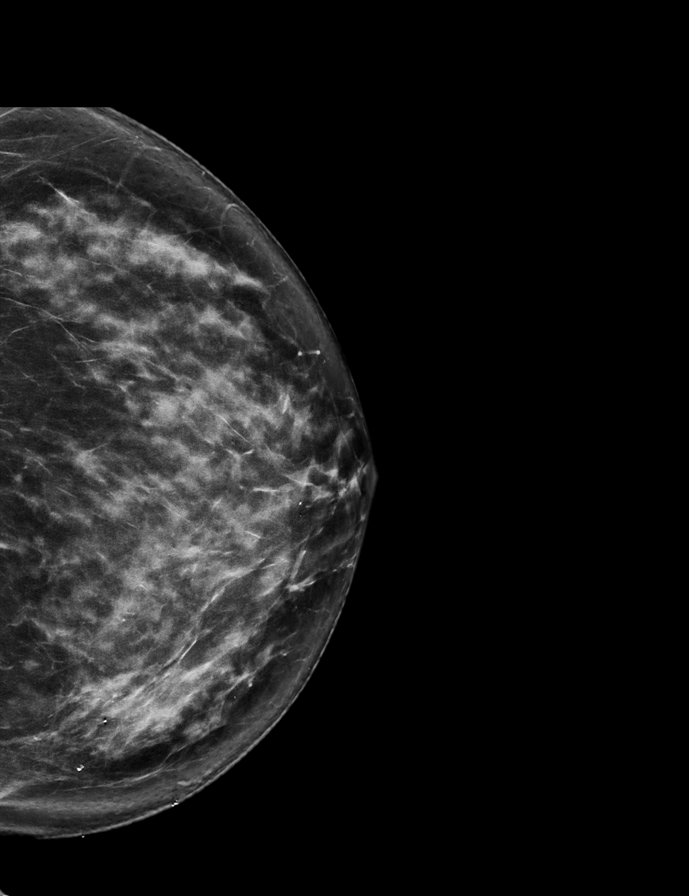

[L MLO synth-2D]
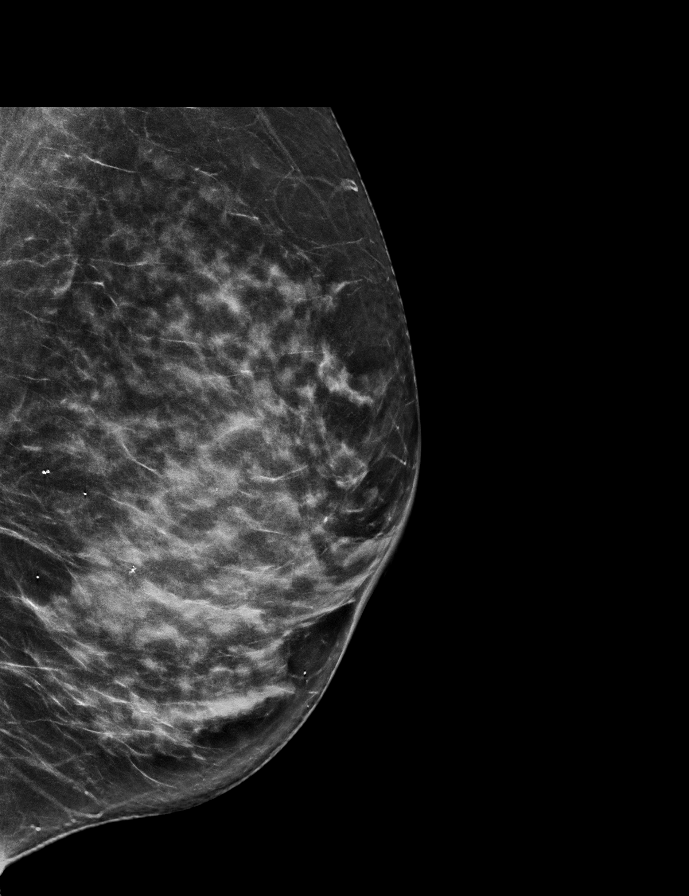

[L MLO tomo · 2 of 64 frames shown]
[frame 21/64]
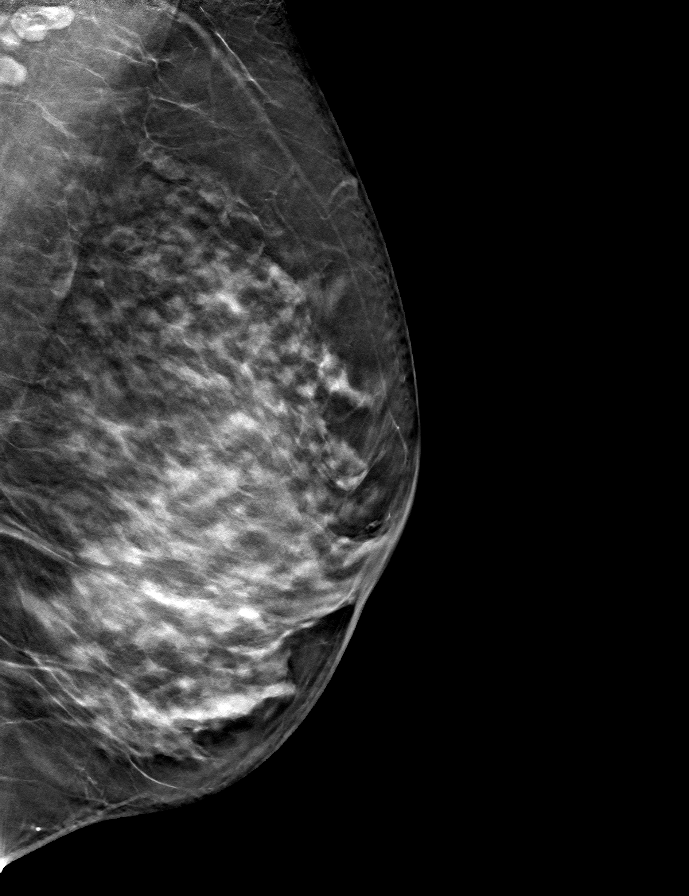
[frame 33/64]
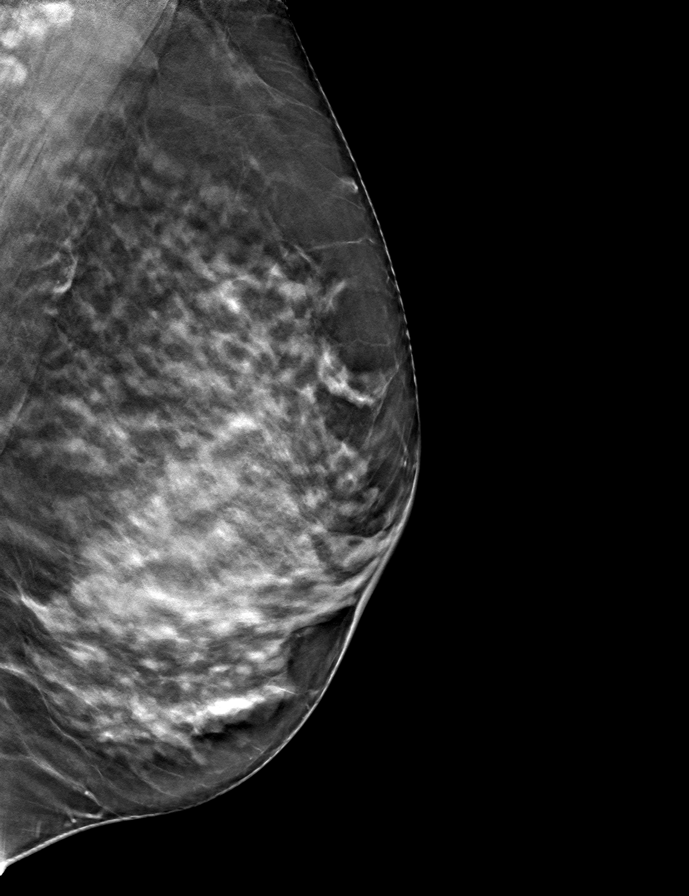

[R MLO tomo · tomo slice 33/64.0]
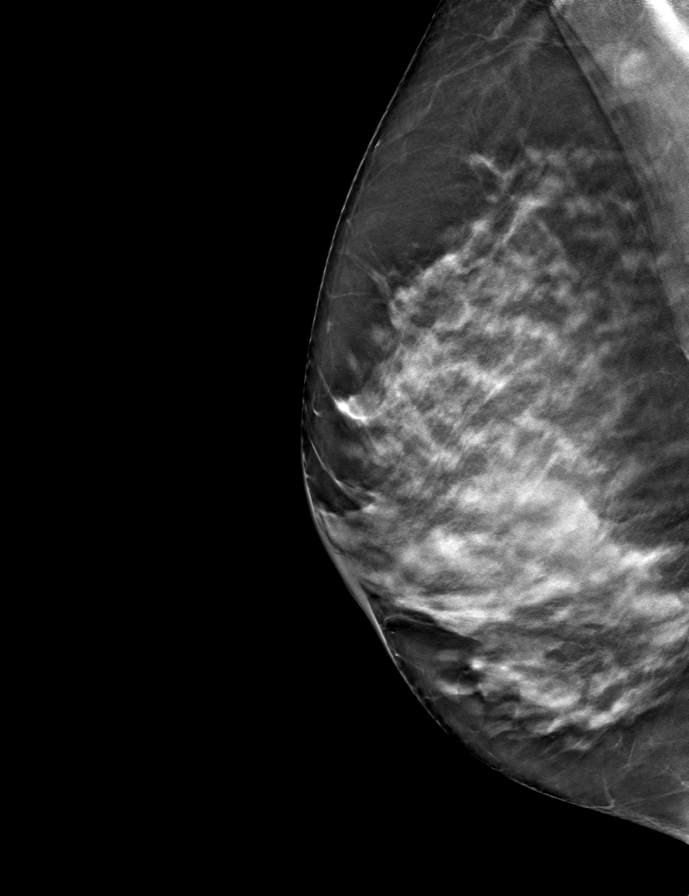

[R CC tomo · tomo slice 35/69.0]
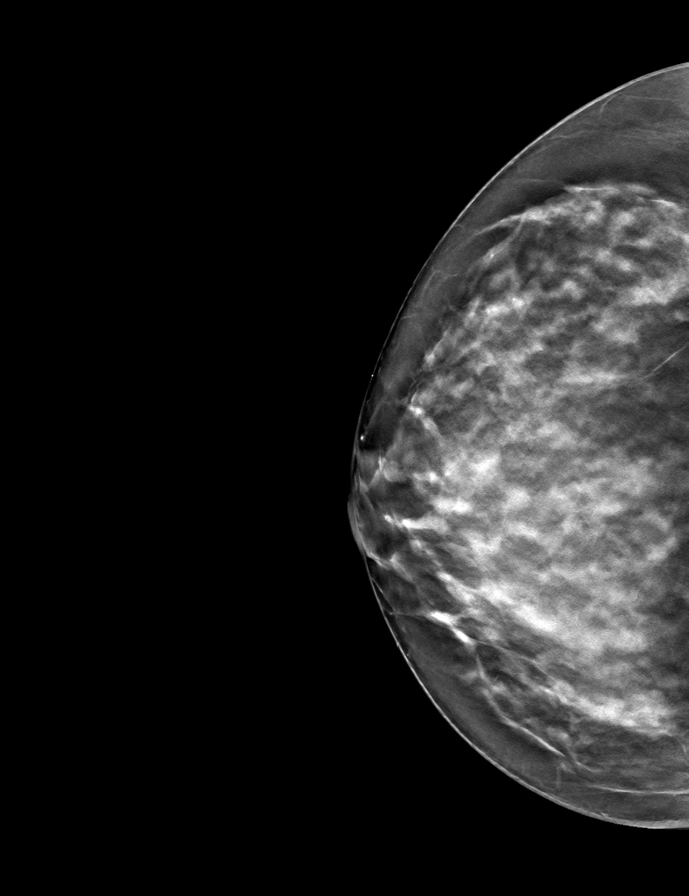

[L CC tomo · tomo slice 35/68.0]
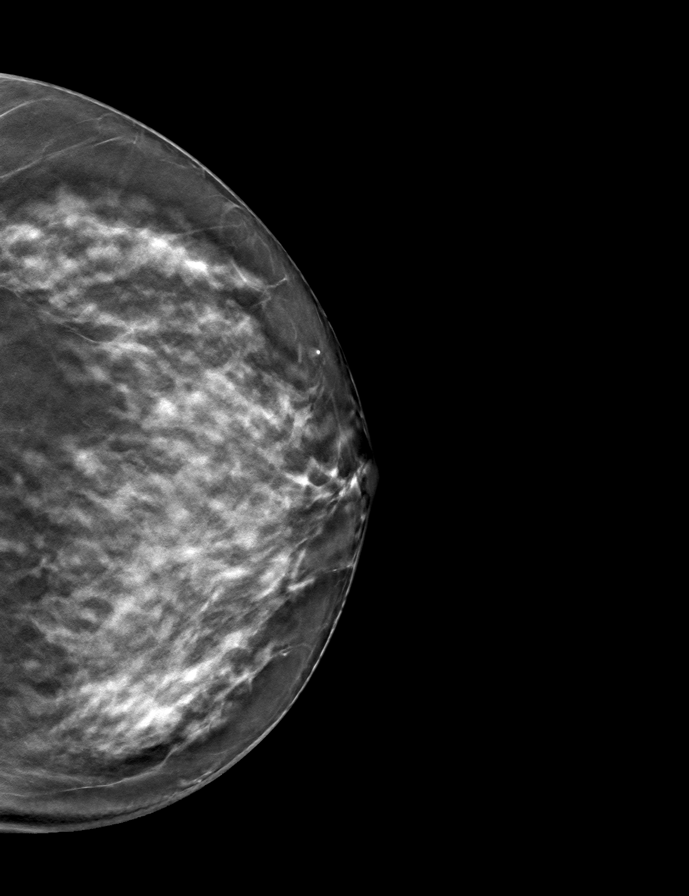

[9 of 24 positions shown; findings below may reference images not displayed]

ACR Breast Density Category c: The breast tissue is heterogeneously
dense, which may obscure small masses.
FINDINGS: There are no findings suspicious for malignancy. Images were
processed with CAD.
IMPRESSION: No mammographic evidence of malignancy. A result letter of this
screening mammogram will be mailed directly to the patient.

RECOMMENDATION:
Screening mammogram in one year. (Code:[id number])

BI-RADS CATEGORY  1: Negative.

## 2019-05-26 DIAGNOSIS — H0013 Chalazion right eye, unspecified eyelid: Secondary | ICD-10-CM | POA: Diagnosis not present

## 2019-06-22 DIAGNOSIS — R69 Illness, unspecified: Secondary | ICD-10-CM | POA: Diagnosis not present

## 2019-06-24 ENCOUNTER — Other Ambulatory Visit: Payer: Self-pay | Admitting: Adult Health

## 2019-07-10 ENCOUNTER — Other Ambulatory Visit: Payer: Self-pay | Admitting: Internal Medicine

## 2019-07-10 NOTE — Telephone Encounter (Signed)
Please advise if ok to refill Isosorbide 30 mg tablet 1/2 tablet qd. Last filled by Historical Provider.

## 2019-07-10 NOTE — Telephone Encounter (Signed)
Patient states that she does not actually isosorbide refilled  She is not sure why the pharmacy requested it Please call if you have any other questions or concerns

## 2019-07-10 NOTE — Telephone Encounter (Signed)
Attempted to call the patient at her home number to clarify how she is taking her imdur as the RX request states 30 mg once daily , but her chart from her last OV with Dr. Saunders Revel states 15 mg once daily.  Per her husband, the patient is currently volunteering at the hospital, but advised we could try her cell #.  I attempted to call the patient on her cell #- no answer, I left a message to please call back to clarify how she is taking her imdur.

## 2019-07-11 ENCOUNTER — Other Ambulatory Visit: Payer: Self-pay | Admitting: Adult Health

## 2019-07-13 ENCOUNTER — Telehealth: Payer: Self-pay | Admitting: Physician Assistant

## 2019-07-13 MED ORDER — ROSUVASTATIN CALCIUM 20 MG PO TABS: 20.0000 mg | ORAL_TABLET | Freq: Every day | ORAL | 0 refills | Status: DC

## 2019-07-13 NOTE — Telephone Encounter (Signed)
Refill sent to pharmacy. Patient spouse is aware med called in/AS, CMA

## 2019-07-13 NOTE — Telephone Encounter (Signed)
Patient is requesting a refill of her Crestor, if approved [facility] to [facility] [address] 

## 2019-07-13 NOTE — Addendum Note (Signed)
Addended by: Mickel Crow on: 07/13/2019 09:30 AM   Modules accepted: Orders

## 2019-07-19 ENCOUNTER — Other Ambulatory Visit: Payer: Self-pay | Admitting: Adult Health

## 2019-08-11 DIAGNOSIS — E890 Postprocedural hypothyroidism: Secondary | ICD-10-CM | POA: Diagnosis not present

## 2019-08-13 DIAGNOSIS — R140 Abdominal distension (gaseous): Secondary | ICD-10-CM | POA: Diagnosis not present

## 2019-08-13 DIAGNOSIS — E890 Postprocedural hypothyroidism: Secondary | ICD-10-CM | POA: Diagnosis not present

## 2019-08-30 ENCOUNTER — Other Ambulatory Visit: Payer: Self-pay | Admitting: Adult Health

## 2019-08-31 ENCOUNTER — Telehealth: Payer: Self-pay | Admitting: Physician Assistant

## 2019-08-31 MED ORDER — CLOPIDOGREL BISULFATE 75 MG PO TABS: 75.0000 mg | ORAL_TABLET | Freq: Every day | ORAL | 0 refills | Status: DC

## 2019-08-31 NOTE — Telephone Encounter (Signed)
Patient is requesting a refill of her clopidogrel, if approved [facility] [address].

## 2019-08-31 NOTE — Telephone Encounter (Signed)
Refill sent to pharmacy. AS, CMA 

## 2019-08-31 NOTE — Addendum Note (Signed)
Addended by: Mickel Crow on: 08/31/2019 10:21 AM   Modules accepted: Orders

## 2019-09-08 DIAGNOSIS — H401132 Primary open-angle glaucoma, bilateral, moderate stage: Secondary | ICD-10-CM | POA: Diagnosis not present

## 2019-09-08 DIAGNOSIS — H353131 Nonexudative age-related macular degeneration, bilateral, early dry stage: Secondary | ICD-10-CM | POA: Diagnosis not present

## 2019-09-16 DIAGNOSIS — H401132 Primary open-angle glaucoma, bilateral, moderate stage: Secondary | ICD-10-CM | POA: Diagnosis not present

## 2019-09-30 ENCOUNTER — Other Ambulatory Visit: Payer: Self-pay | Admitting: Physician Assistant

## 2019-10-06 ENCOUNTER — Telehealth: Payer: Self-pay | Admitting: Physician Assistant

## 2019-10-06 MED ORDER — ROSUVASTATIN CALCIUM 20 MG PO TABS: 20.0000 mg | ORAL_TABLET | Freq: Every day | ORAL | 0 refills | Status: DC

## 2019-10-06 NOTE — Telephone Encounter (Signed)
Patient called in stating she needs a refill on medication. [facility] [address].  Med is rosuvastatin

## 2019-10-06 NOTE — Addendum Note (Signed)
Addended by: Fonnie Mu on: 10/06/2019 10:14 AM   Modules accepted: Orders

## 2019-10-12 ENCOUNTER — Other Ambulatory Visit: Payer: Self-pay | Admitting: Internal Medicine

## 2019-10-12 NOTE — Telephone Encounter (Signed)
LMOVM to verify how pt is taking Isosobide 30 mg tablet.

## 2019-10-12 NOTE — Telephone Encounter (Signed)
Patient is returning your call.  

## 2019-10-12 NOTE — Telephone Encounter (Signed)
Pt is taking Isosorbide 30 mg tablet 1/2 tablet qd. Pt would like 90 day refill. Please advise if ok to refill last filled historical provider.

## 2019-10-19 ENCOUNTER — Other Ambulatory Visit: Payer: Self-pay

## 2019-10-19 ENCOUNTER — Encounter: Payer: Self-pay | Admitting: Physician Assistant

## 2019-10-19 ENCOUNTER — Ambulatory Visit (INDEPENDENT_AMBULATORY_CARE_PROVIDER_SITE_OTHER): Payer: Medicare HMO | Admitting: Physician Assistant

## 2019-10-19 VITALS — Ht 62.0 in

## 2019-10-19 DIAGNOSIS — E039 Hypothyroidism, unspecified: Secondary | ICD-10-CM

## 2019-10-19 DIAGNOSIS — R7303 Prediabetes: Secondary | ICD-10-CM

## 2019-10-19 DIAGNOSIS — Z0000 Encounter for general adult medical examination without abnormal findings: Secondary | ICD-10-CM

## 2019-10-19 DIAGNOSIS — E785 Hyperlipidemia, unspecified: Secondary | ICD-10-CM

## 2019-10-19 NOTE — Patient Instructions (Signed)
Preventive Care 75 Years and Older, Female Preventive care refers to lifestyle choices and visits with your health care provider that can promote health and wellness. This includes:  A yearly physical exam. This is also called an annual well check.  Regular dental and eye exams.  Immunizations.  Screening for certain conditions.  Healthy lifestyle choices, such as diet and exercise. What can I expect for my preventive care visit? Physical exam Your health care provider will check:  Height and weight. These may be used to calculate body mass index (BMI), which is a measurement that tells if you are at a healthy weight.  Heart rate and blood pressure.  Your skin for abnormal spots. Counseling Your health care provider may ask you questions about:  Alcohol, tobacco, and drug use.  Emotional well-being.  Home and relationship well-being.  Sexual activity.  Eating habits.  History of falls.  Memory and ability to understand (cognition).  Work and work Statistician.  Pregnancy and menstrual history. What immunizations do I need?  Influenza (flu) vaccine  This is recommended every year. Tetanus, diphtheria, and pertussis (Tdap) vaccine  You may need a Td booster every 10 years. Varicella (chickenpox) vaccine  You may need this vaccine if you have not already been vaccinated. Zoster (shingles) vaccine  You may need this after age 75. Pneumococcal conjugate (PCV13) vaccine  One dose is recommended after age 75. Pneumococcal polysaccharide (PPSV23) vaccine  One dose is recommended after age 75. Measles, mumps, and rubella (MMR) vaccine  You may need at least one dose of MMR if you were born in 1957 or later. You may also need a second dose. Meningococcal conjugate (MenACWY) vaccine  You may need this if you have certain conditions. Hepatitis A vaccine  You may need this if you have certain conditions or if you travel or work in places where you may be exposed  to hepatitis A. Hepatitis B vaccine  You may need this if you have certain conditions or if you travel or work in places where you may be exposed to hepatitis B. Haemophilus influenzae type b (Hib) vaccine  You may need this if you have certain conditions. You may receive vaccines as individual doses or as more than one vaccine together in one shot (combination vaccines). Talk with your health care provider about the risks and benefits of combination vaccines. What tests do I need? Blood tests  Lipid and cholesterol levels. These may be checked every 5 years, or more frequently depending on your overall health.  Hepatitis C test.  Hepatitis B test. Screening  Lung cancer screening. You may have this screening every year starting at age 75 if you have a 30-pack-year history of smoking and currently smoke or have quit within the past 15 years.  Colorectal cancer screening. All adults should have this screening starting at age 75 and continuing until age 4. Your health care provider may recommend screening at age 75 if you are at increased risk. You will have tests every 1-10 years, depending on your results and the type of screening test.  Diabetes screening. This is done by checking your blood sugar (glucose) after you have not eaten for a while (fasting). You may have this done every 1-3 years.  Mammogram. This may be done every 1-2 years. Talk with your health care provider about how often you should have regular mammograms.  BRCA-related cancer screening. This may be done if you have a family history of breast, ovarian, tubal, or peritoneal cancers.  Other tests °· Sexually transmitted disease (STD) testing. °· Bone density scan. This is done to screen for osteoporosis. You may have this done starting at age 75. °Follow these instructions at home: °Eating and drinking °· Eat a diet that includes fresh fruits and vegetables, whole grains, lean protein, and low-fat dairy products. Limit  your intake of foods with high amounts of sugar, saturated fats, and salt. °· Take vitamin and mineral supplements as recommended by your health care provider. °· Do not drink alcohol if your health care provider tells you not to drink. °· If you drink alcohol: °? Limit how much you have to 0-1 drink a day. °? Be aware of how much alcohol is in your drink. In the [place] one drink equals one 12 oz bottle of beer (355 mL), one 5 oz glass of wine (148 mL), or one 1½ oz glass of hard liquor (44 mL). °Lifestyle °· Take daily care of your teeth and gums. °· Stay active. Exercise for at least 30 minutes on 5 or more days each week. °· Do not use any products that contain nicotine or tobacco, such as cigarettes, e-cigarettes, and chewing tobacco. If you need help quitting, ask your health care provider. °· If you are sexually active, practice safe sex. Use a condom or other form of protection in order to prevent STIs (sexually transmitted infections). °· Talk with your health care provider about taking a low-dose aspirin or statin. °What's next? °· Go to your health care provider once a year for a well check visit. °· Ask your health care provider how often you should have your eyes and teeth checked. °· Stay up to date on all vaccines. °This information is not intended to replace advice given to you by your health care provider. Make sure you discuss any questions you have with your health care provider. °Document Revised: 02/20/2018 Document Reviewed: 02/20/2018 °Elsevier Patient Education © 2020 [employer] ° °

## 2019-10-19 NOTE — Progress Notes (Signed)
Subjective:   NEREA BORDENAVE is a 74 y.o. female who presents for Medicare Annual (Subsequent) preventive examination.  Review of Systems    Review of Systems: General:   No F/C, wt loss Pulm:   No DIB, SOB, pleuritic chest pain Card:  No CP, palpitations from baseline Abd:  No n/v/d or pain Ext:  No edema   Objective:    Today's Vitals   10/19/19 1026  Height: 5\' 2"  (1.575 m)   Body mass index is 23.98 kg/m.  Advanced Directives 05/16/2018 02/17/2018 02/12/2018 07/26/2017 04/19/2017 12/26/2016 08/10/2016  Does Patient Have a Medical Advance Directive? Yes Yes Yes No Yes Yes Yes  Type of Paramedic of [place];Living will Living will;Healthcare Power of [place];Living will - [place];Living will [place];Living will -  Does patient want to make changes to medical advance directive? No - Patient declined - No - Patient declined - No - Patient declined - -  Copy of [place] in Chart? No - copy requested - No - copy requested - No - copy requested - -  Would patient like information on creating a medical advance directive? - - - - - - -    Current Medications (verified) Outpatient Encounter Medications as of 10/19/2019  Medication Sig  . acetaminophen (TYLENOL) 500 MG tablet Take 500 mg by mouth daily as needed for moderate pain or headache.   . Calcium Carbonate-Vitamin D (CALCIUM 600+D PO) Take by mouth daily.  . clopidogrel (PLAVIX) 75 MG tablet Take 1 tablet (75 mg total) by mouth daily.  . diazepam (VALIUM) 5 MG tablet Take 1 tablet (5 mg total) by mouth at bedtime as needed for anxiety (sleep).  . diphenhydrAMINE (BENADRYL) 25 MG tablet Take 25 mg by mouth at bedtime as needed for sleep.  . dorzolamide-timolol (COSOPT) 22.3-6.8 MG/ML ophthalmic solution Place into both eyes daily.  . isosorbide mononitrate (IMDUR) 30 MG 24 hr tablet Take 0.5 tablets (15 mg total) by  mouth daily.  . lansoprazole (PREVACID) 30 MG capsule Take 30 mg by mouth. One capsule 3 times weekly  . latanoprost (XALATAN) 0.005 % ophthalmic solution Place 1 drop into both eyes at bedtime.  . Lutein 20 MG TABS Take 1 tablet by mouth daily.  . Multiple Vitamin (MULTIVITAMIN) tablet Take 1 tablet by mouth daily.  . rosuvastatin (CRESTOR) 20 MG tablet Take 1 tablet (20 mg total) by mouth daily.  Marland Kitchen levothyroxine (SYNTHROID) 88 MCG tablet Take 88 mcg by mouth daily.  . [DISCONTINUED] levothyroxine (SYNTHROID) 75 MCG tablet Take 75 mcg by mouth daily before breakfast.  . [DISCONTINUED] pantoprazole (PROTONIX) 40 MG tablet Take 40 mg by mouth daily as needed (indigestion).   No facility-administered encounter medications on file as of 10/19/2019.    Allergies (verified) Neuromuscular blocking agents, Vicodin [hydrocodone-acetaminophen], and Morphine and related   History: Past Medical History:  Diagnosis Date  . Anxiety   . Barrett esophagus 2003  . Diverticulitis   . Diverticulosis   . GERD (gastroesophageal reflux disease)   . Glaucoma   . Grave's disease   . H. pylori infection   . Hepatitis A    in her 38 from food  . Hyperlipidemia   . Hypothyroidism   . Increased pressure in the eye   . Insomnia   . Morton's neuroma   . Murmur   . Scoliosis   . Stroke ([place]) 12/2014   no deficits  Past Surgical History:  Procedure Laterality Date  . AORTIC ARCH ANGIOGRAPHY N/A 01/01/2017   Procedure: AORTIC ARCH ANGIOGRAPHY;  Surgeon: Serafina Mitchell, MD;  Location: [facility];  Service: Cardiovascular;  Laterality: N/A;  . APPENDECTOMY  1958  . CATARACT EXTRACTION Left   . CATARACT EXTRACTION  02/12/2018  . CATARACT EXTRACTION W/PHACO Left 07/02/2016   Procedure: CATARACT EXTRACTION PHACO AND INTRAOCULAR LENS PLACEMENT ([place])  Left;  Surgeon: Ronnell Freshwater, MD;  Location: [facility];  Service: Ophthalmology;  Laterality: Left;  . CATARACT EXTRACTION  W/PHACO Right 02/12/2018   Procedure: CATARACT EXTRACTION PHACO AND INTRAOCULAR LENS PLACEMENT ([place])  RIGHT;  Surgeon: Leandrew Koyanagi, MD;  Location: [facility];  Service: Ophthalmology;  Laterality: Right;  . CERVICAL LAMINECTOMY  1995  . COLON SURGERY    . EYE SURGERY     due to graves disease  . FEMUR SURGERY Right    benign bone growth  . FOOT NEUROMA SURGERY Right   . INTRAVASCULAR PRESSURE WIRE/FFR STUDY N/A 04/19/2017   Procedure: INTRAVASCULAR PRESSURE WIRE/FFR STUDY;  Surgeon: Nelva Bush, MD;  Location: [facility];  Service: Cardiovascular;  Laterality: N/A;  . LAPAROSCOPIC ASSISTED VAGINAL HYSTERECTOMY    . LAPAROSCOPIC SIGMOID COLECTOMY  05/13/06   Dr Zella Richer  . LEFT HEART CATH AND CORONARY ANGIOGRAPHY N/A 04/19/2017   Procedure: LEFT HEART CATH AND CORONARY ANGIOGRAPHY;  Surgeon: Nelva Bush, MD;  Location: [facility];  Service: Cardiovascular;  Laterality: N/A;  . LUMBAR [place] SURGERY  2009  . PERIPHERAL VASCULAR INTERVENTION Left 01/01/2017   Procedure: PERIPHERAL VASCULAR INTERVENTION;  Surgeon: Serafina Mitchell, MD;  Location: [facility];  Service: Cardiovascular;  Laterality: Left;  . SUBCLAVIAN STENT PLACEMENT    . TONSILLECTOMY  1992  . TRIGGER FINGER RELEASE Right 05/26/2018   Procedure: RIGHT INDEX RELEASE TRIGGER FINGER/A-1 PULLEY;  Surgeon: [name], MD;  Location: [facility];  Service: Orthopedics;  Laterality: Right;  . TUBAL LIGATION    . UPPER EXTREMITY ANGIOGRAPHY Left 01/01/2017   Procedure: Upper Extremity Angiography;  Surgeon: Serafina Mitchell, MD;  Location: [facility];  Service: Cardiovascular;  Laterality: Left;   Family History  Problem Relation Age of Onset  . Other Father 28       SEPSIS  . Hypertension Father   . Stroke Father 25       CVA  . Heart failure Mother 66  . Heart murmur Mother   . Heart attack Sister 84  . Diabetes Brother   . Breast cancer Sister 89  .  Stroke Sister   . Cirrhosis Brother   . Hypertension Sister        x 2  . Diabetes Sister   . Heart murmur Daughter   . Breast cancer Maternal Aunt   . Breast cancer Paternal Aunt   . Breast cancer Cousin   . Breast cancer Cousin   . Breast cancer Cousin   . Breast cancer Cousin    Social History   Socioeconomic History  . Marital status: Married    Spouse name: Not on file  . Number of children: 1  . Years of education: Not on file  . Highest education level: Not on file  Occupational History  . Occupation: Merchant navy officer    Comment: @ [place] on [employer]  Tobacco Use  . Smoking status: Former Smoker    Packs/day: 0.25    Types: Cigarettes  Quit date: 03/12/1978    Years since quitting: 41.6  . Smokeless tobacco: Never Used  . Tobacco comment: quit in 1978  Vaping Use  . Vaping Use: Never used  Substance and Sexual Activity  . Alcohol use: Yes    Alcohol/week: 7.0 standard drinks    Types: 7 Glasses of wine per week  . Drug use: No  . Sexual activity: Not Currently    Birth control/protection: None  Other Topics Concern  . Not on file  Social History Narrative  . Not on file   Social Determinants of Health   Financial Resource Strain:   . Difficulty of Paying Living Expenses:   Food Insecurity:   . Worried About Charity fundraiser in the Last Year:   . Arboriculturist in the Last Year:   Transportation Needs:   . Film/video editor (Medical):   Marland Kitchen Lack of Transportation (Non-Medical):   Physical Activity:   . Days of Exercise per Week:   . Minutes of Exercise per Session:   Stress:   . Feeling of Stress :   Social Connections:   . Frequency of Communication with Friends and Family:   . Frequency of Social Gatherings with Friends and Family:   . Attends Religious Services:   . Active Member of Clubs or Organizations:   . Attends Archivist Meetings:   Marland Kitchen Marital Status:     Tobacco Counseling Counseling given: Not  Answered Comment: quit in 1978    Diabetic? No    Activities of Daily Living In your present state of health, do you have any difficulty performing the following activities: 10/19/2019  Hearing? N  Vision? Y  Difficulty concentrating or making decisions? N  Walking or climbing stairs? N  Dressing or bathing? N  Doing errands, shopping? N  Some recent data might be hidden    Patient Care Team: Lorrene Reid, PA-C as PCP - General (Physician Assistant) End, Harrell Gave, MD as PCP - Cardiology (Cardiology) Almedia Balls, MD as Consulting Physician (Orthopedic Surgery) Garvin Fila, MD as Consulting Physician (Neurology) Milus Banister, MD as Attending Physician (Gastroenterology) Karren Burly Deirdre Peer, MD as Referring Physician (Ophthalmology)  Indicate any recent Medical Services you may have received from other than [facility] providers in the past year (date may be approximate).     Assessment:   This is a routine wellness examination for Tatijana.  Hearing/Vision screen No exam data present  Dietary issues and exercise activities discussed: -Well balanced diet  and reports she doesn't always monitor sodium intake, but doesn't have issues with elevated BP or fluid retention. -Stays very active with walking, house work, water aerobics and yoga.  Goals   None    Depression Screen PHQ 2/9 Scores 10/19/2019 04/20/2019 09/18/2018 03/03/2018 02/25/2017 09/17/2016 05/29/2016  PHQ - 2 Score 0 0 0 0 2 0 0  PHQ- 9 Score 0 0 3 2 7  - -    Fall Risk Fall Risk  10/19/2019 09/18/2018 09/17/2016 09/29/2015 03/15/2015  Falls in the past year? 0 0 No No No  Risk for fall due to : No Fall Risks - - - -  Follow up Falls evaluation completed - - - -    Any stairs in or around the home? Yes  If so, are there any without handrails? No  Home free of loose throw rugs in walkways, pet beds, electrical cords, etc? Yes  Adequate lighting in your home to reduce risk of falls? Yes  ASSISTIVE DEVICES  UTILIZED TO PREVENT FALLS:  Life alert? No  Use of a cane, walker or w/c? No  Grab bars in the bathroom? Yes  Shower chair or bench in shower? No  Elevated toilet seat or a handicapped toilet? No   TIMED UP AND GO:  Was the test performed? Yes .  Length of time to ambulate 10 feet: 10 sec.   Gait steady and fast without assistive device.  Cognitive Function: WNL     6CIT Screen 10/19/2019 09/17/2016  What Year? 0 points 0 points  What month? 0 points 0 points  What time? 0 points 0 points  Count back from 20 0 points 0 points  Months in reverse 0 points 0 points  Repeat phrase 4 points 0 points  Total Score 4 0    Immunizations Immunization History  Administered Date(s) Administered  . Influenza-Unspecified 01/09/2016, 12/12/2016, 12/11/2018  . PFIZER SARS-COV-2 Vaccination 04/08/2019, 04/29/2019  . Pneumococcal Conjugate-13 12/12/2016  . Pneumococcal Polysaccharide-23 09/18/2018    TDAP status: Up to date Flu Vaccine status: Up to date Pneumococcal vaccine status: Up to date Covid-19 vaccine status: Completed vaccines  Qualifies for Shingles Vaccine? Yes   Zostavax completed No   Shingrix Completed?: Yes  Screening Tests Health Maintenance  Topic Date Due  . Hepatitis C Screening  Never done  . TETANUS/TDAP  Never done  . INFLUENZA VACCINE  10/11/2019  . MAMMOGRAM  12/29/2020  . COLONOSCOPY  08/11/2026  . DEXA SCAN  Completed  . COVID-19 Vaccine  Completed  . PNA vac Low Risk Adult  Completed    Health Maintenance  Health Maintenance Due  Topic Date Due  . Hepatitis C Screening  Never done  . TETANUS/TDAP  Never done  . INFLUENZA VACCINE  10/11/2019    Colorectal cancer screening: Completed 08/10/2016. Repeat every 5-10 years Mammogram:  UTD per pt.  Will obtain copy for our records Bone density:  Pt declined Lung Cancer Screening: (Low Dose CT Chest recommended if Age 35-80 years, 30 pack-year currently smoking OR have quit w/in 15years.) does not  qualify.    Additional Screening:  Hepatitis C Screening: does qualify; Order placed Pt prefers to wait and declined at this time.  Vision Screening: Recommended annual ophthalmology exams for early detection of glaucoma and other disorders of the eye. Is the patient up to date with their annual eye exam?  Yes  Who is the provider or what is the name of the office in which the patient attends annual eye exams?  [place] Screening: Recommended annual dental exams for proper oral hygiene  Community Resource Referral / Chronic Care Management: CRR required this visit?  No   CCM required this visit?  No      Plan:  -Continue to follow-up with various specialists. -Continue current medication regimen. -Follow up for fasting blood work within the next few days. -Follow up in 6 months for reg OV: HLD, Pre-DM, insomnia  I have personally reviewed and noted the following in the patient's chart:   . Medical and social history . Use of alcohol, tobacco or illicit drugs  . Current medications and supplements . Functional ability and status . Nutritional status . Physical activity . Advanced directives . List of other physicians . Hospitalizations, surgeries, and ER visits in previous 12 months . Vitals . Screenings to include cognitive, depression, and falls . Referrals and appointments  In addition, I have reviewed and discussed with patient certain preventive protocols,  quality metrics, and best practice recommendations. A written personalized care plan for preventive services as well as general preventive health recommendations were provided to patient.     Lorrene Reid, PA-C   10/19/2019   Nurse Notes: none

## 2019-10-21 ENCOUNTER — Other Ambulatory Visit: Payer: Medicare HMO

## 2019-10-21 ENCOUNTER — Other Ambulatory Visit: Payer: Self-pay

## 2019-10-21 DIAGNOSIS — E039 Hypothyroidism, unspecified: Secondary | ICD-10-CM | POA: Diagnosis not present

## 2019-10-21 DIAGNOSIS — R7303 Prediabetes: Secondary | ICD-10-CM | POA: Diagnosis not present

## 2019-10-21 DIAGNOSIS — Z0000 Encounter for general adult medical examination without abnormal findings: Secondary | ICD-10-CM

## 2019-10-21 DIAGNOSIS — E785 Hyperlipidemia, unspecified: Secondary | ICD-10-CM

## 2019-10-22 LAB — COMPREHENSIVE METABOLIC PANEL
ALT: 22 IU/L (ref 0–32)
AST: 27 IU/L (ref 0–40)
Albumin/Globulin Ratio: 1.9 (ref 1.2–2.2)
Albumin: 4.7 g/dL (ref 3.7–4.7)
Alkaline Phosphatase: 86 IU/L (ref 48–121)
BUN/Creatinine Ratio: 19 (ref 12–28)
BUN: 14 mg/dL (ref 8–27)
Bilirubin Total: 0.6 mg/dL (ref 0.0–1.2)
CO2: 25 mmol/L (ref 20–29)
Calcium: 9.7 mg/dL (ref 8.7–10.3)
Chloride: 100 mmol/L (ref 96–106)
Creatinine, Ser: 0.72 mg/dL (ref 0.57–1.00)
GFR calc Af Amer: 95 mL/min/{1.73_m2} (ref >59)
GFR calc non Af Amer: 83 mL/min/{1.73_m2} (ref >59)
Globulin, Total: 2.5 g/dL (ref 1.5–4.5)
Glucose: 101 mg/dL — ABNORMAL HIGH (ref 65–99)
Potassium: 5.1 mmol/L (ref 3.5–5.2)
Sodium: 136 mmol/L (ref 134–144)
Total Protein: 7.2 g/dL (ref 6.0–8.5)

## 2019-10-22 LAB — CBC WITH DIFFERENTIAL/PLATELET
Basophils Absolute: 0 10*3/uL (ref 0.0–0.2)
Basos: 1 %
EOS (ABSOLUTE): 0.1 10*3/uL (ref 0.0–0.4)
Eos: 3 %
Hematocrit: 45.8 % (ref 34.0–46.6)
Hemoglobin: 14.6 g/dL (ref 11.1–15.9)
Immature Grans (Abs): 0 10*3/uL (ref 0.0–0.1)
Immature Granulocytes: 0 %
Lymphocytes Absolute: 1.7 10*3/uL (ref 0.7–3.1)
Lymphs: 40 %
MCH: 29.6 pg (ref 26.6–33.0)
MCHC: 31.9 g/dL (ref 31.5–35.7)
MCV: 93 fL (ref 79–97)
Monocytes Absolute: 0.6 10*3/uL (ref 0.1–0.9)
Monocytes: 15 %
Neutrophils Absolute: 1.7 10*3/uL (ref 1.4–7.0)
Neutrophils: 41 %
Platelets: 231 10*3/uL (ref 150–450)
RBC: 4.94 x10E6/uL (ref 3.77–5.28)
RDW: 12.2 % (ref 11.7–15.4)
WBC: 4.2 10*3/uL (ref 3.4–10.8)

## 2019-10-22 LAB — HEMOGLOBIN A1C
Est. average glucose Bld gHb Est-mCnc: 123 mg/dL
Hgb A1c MFr Bld: 5.9 % — ABNORMAL HIGH (ref 4.8–5.6)

## 2019-10-22 LAB — LIPID PANEL
Chol/HDL Ratio: 2.4 ratio (ref 0.0–4.4)
Cholesterol, Total: 146 mg/dL (ref 100–199)
HDL: 60 mg/dL (ref >39)
LDL Chol Calc (NIH): 73 mg/dL (ref 0–99)
Triglycerides: 65 mg/dL (ref 0–149)
VLDL Cholesterol Cal: 13 mg/dL (ref 5–40)

## 2019-10-22 LAB — TSH: TSH: 0.693 u[IU]/mL (ref 0.450–4.500)

## 2019-11-24 ENCOUNTER — Other Ambulatory Visit: Payer: Self-pay | Admitting: Physician Assistant

## 2019-11-25 ENCOUNTER — Other Ambulatory Visit: Payer: Self-pay

## 2019-11-25 ENCOUNTER — Encounter: Payer: Self-pay | Admitting: Internal Medicine

## 2019-11-25 ENCOUNTER — Ambulatory Visit: Payer: Medicare HMO | Admitting: Internal Medicine

## 2019-11-25 VITALS — BP 116/68 | HR 68 | Ht 62.0 in | Wt 119.0 lb

## 2019-11-25 DIAGNOSIS — E785 Hyperlipidemia, unspecified: Secondary | ICD-10-CM | POA: Diagnosis not present

## 2019-11-25 DIAGNOSIS — I739 Peripheral vascular disease, unspecified: Secondary | ICD-10-CM

## 2019-11-25 DIAGNOSIS — I25118 Atherosclerotic heart disease of native coronary artery with other forms of angina pectoris: Secondary | ICD-10-CM | POA: Diagnosis not present

## 2019-11-25 NOTE — Patient Instructions (Signed)
Medication Instructions:  Your physician recommends that you continue on your current medications as directed. Please refer to the Current Medication list given to you today.  *If you need a refill on your cardiac medications before your next appointment, please call your pharmacy*  Follow-Up: At [facility], you and your health needs are our priority.  As part of our continuing mission to provide you with exceptional heart care, we have created designated Provider Care Teams.  These Care Teams include your primary Cardiologist (physician) and Advanced Practice Providers (APPs -  Physician Assistants and Nurse Practitioners) who all work together to provide you with the care you need, when you need it.  We recommend signing up for the patient portal called "MyChart".  Sign up information is provided on this After Visit Summary.  MyChart is used to connect with patients for Virtual Visits (Telemedicine).  Patients are able to view lab/test results, encounter notes, upcoming appointments, etc.  Non-urgent messages can be sent to your provider as well.   To learn more about what you can do with MyChart, go to [email or web address].    Your next appointment:   3 month(s)  The format for your next appointment:   In Person  Provider:    You may see Nelva Bush, MD or one of the following Advanced Practice Providers on your designated Care Team:    Murray Hodgkins, NP  Christell Faith, PA-C  Marrianne Mood, PA-C

## 2019-11-25 NOTE — Progress Notes (Signed)
Follow-up Outpatient Visit Date: 11/25/2019  Primary Care Provider: Lorrene Reid, PA-C [place] [address]  Chief Complaint: Follow-up CAD  HPI:  Ms. Keagy is a 74 y.o. female with history of nonobstructive CAD,peripheral vascular disease with right vertebral artery occlusion and left subclavian artery stenosis status post stenting (12/2016),stroke in 2016, hyperlipidemia, hypothyroidism, and GERD, who presents for follow-up of coronary artery disease. She was last seen in the office a year ago at which time she was feeling quite well. She reported resolution of chest pain with isosorbide mononitrate. We agreed to defer medication changes and additional testing at that time.  Today, Ms. Huffaker reports feeling relatively well.  She states that it is sometime difficult to get going when she first starts an activity.  However, within a few minutes, the fatigue general abates and she is able to continue without any limitations.  She denies chest pain, shortness of breath, left arm pain, palpitations, lightheadedness, and edema.  She is continues to play golf and participate in water aerobics on a regular basis.  --------------------------------------------------------------------------------------------------  Cardiovascular History & Procedures: Cardiovascular Problems:  Peripheral vascular disease  Stroke  Stable angina with nonobstructive coronary artery disease  Risk Factors:  Peripheral and cerebrovascular disease, hyperlipidemia, and age greater than 37  Cath/PCI:  LHC (04/19/17): LMCA normal. LAD with sequential 50% and 25% lesions (FFR 0.92). Ramus with minimal disease. LCx normal. RCA with mild diffuse disease. LVEDP 17 mmHg. LVEF 55-65%.  CV Surgery:  Aortography and left subclavian angiography; intervention (01/01/17, Dr. Trula Slade): Occluded right vertebral artery. 80% left subclavian artery stenosis with successful intervention with Xpress  8 x 25 mm balloon-expandable stent.  EP Procedures and Devices:  30-day event monitor (11/28/16): Sinus rhythm. No significant arrhythmia.  Non-Invasive Evaluation(s):  Carotid Doppler (02/17/18): 1-39% stenosis of both internal carotid arteries.  Right vertebral artery is occluded.  Left vertebral artery shows antegrade flow.  No significant subclavian artery stenosis bilaterally.  Exercise MPI (11/22/16): Low risk study with small in size, mild in severity, fixed apical anterior and apical defect likely representing apical thinning. LVEF >65% with normal wall motion. Patient complained of fatigue during test, exercising 5:30 minutes.  Upper extremity arterial Doppler (10/26/16): Right upper extremity evaluation normal. Left upper extremity demonstrates dampened biphasic to monophasic waveforms and 20 mmHg pressure difference compared to the right.  Carotid artery duplex (10/18/16): Bilateral 1-39% stenoses involving the right and left internal carotid arteries.  Transthoracic echocardiogram (12/28/14): Normal LV size and function. LVEF 60-65% with grade 1 diastolic dysfunction. Normal RV size and function. No significant vascular abnormalities.  Recent CV Pertinent Labs: Lab Results  Component Value Date   CHOL 146 10/21/2019   HDL 60 10/21/2019   LDLCALC 73 10/21/2019   TRIG 65 10/21/2019   CHOLHDL 2.4 10/21/2019   CHOLHDL 3.9 12/27/2014   INR 1.0 04/12/2017   K 5.1 10/21/2019   BUN 14 10/21/2019   CREATININE 0.72 10/21/2019    Past medical and surgical history were reviewed and updated in EPIC.  Current Meds  Medication Sig  . acetaminophen (TYLENOL) 500 MG tablet Take 500 mg by mouth daily as needed for moderate pain or headache.   . Calcium Carbonate-Vitamin D (CALCIUM 600+D PO) Take by mouth daily.  . clopidogrel (PLAVIX) 75 MG tablet TAKE 1 TABLET BY MOUTH EVERY DAY  . diazepam (VALIUM) 5 MG tablet Take 1 tablet (5 mg total) by mouth at bedtime as needed for anxiety  (sleep).  Marland Kitchen  dorzolamide-timolol (COSOPT) 22.3-6.8 MG/ML ophthalmic solution Place into both eyes daily.  . isosorbide mononitrate (IMDUR) 30 MG 24 hr tablet Take 0.5 tablets (15 mg total) by mouth daily.  . lansoprazole (PREVACID) 30 MG capsule Take 30 mg by mouth. One capsule 3 times weekly  . latanoprost (XALATAN) 0.005 % ophthalmic solution Place 1 drop into both eyes at bedtime.  Marland Kitchen levothyroxine (SYNTHROID) 88 MCG tablet Take 88 mcg by mouth daily.  . Lutein 20 MG TABS Take 1 tablet by mouth daily.  . Multiple Vitamin (MULTIVITAMIN) tablet Take 1 tablet by mouth daily.  . rosuvastatin (CRESTOR) 20 MG tablet Take 1 tablet (20 mg total) by mouth daily.    Allergies: Neuromuscular blocking agents, Vicodin [hydrocodone-acetaminophen], and Morphine and related  Social History   Tobacco Use  . Smoking status: Former Smoker    Packs/day: 0.25    Types: Cigarettes    Quit date: 03/12/1978    Years since quitting: 41.7  . Smokeless tobacco: Never Used  . Tobacco comment: quit in 1978  Vaping Use  . Vaping Use: Never used  Substance Use Topics  . Alcohol use: Yes    Alcohol/week: 7.0 standard drinks    Types: 7 Glasses of wine per week  . Drug use: No    Family History  Problem Relation Age of Onset  . Other Father 79       SEPSIS  . Hypertension Father   . Stroke Father 44       CVA  . Heart failure Mother 23  . Heart murmur Mother   . Heart attack Sister 51  . Diabetes Brother   . Breast cancer Sister 70  . Stroke Sister   . Cirrhosis Brother   . Hypertension Sister        x 2  . Diabetes Sister   . Heart murmur Daughter   . Breast cancer Maternal Aunt   . Breast cancer Paternal Aunt   . Breast cancer Cousin   . Breast cancer Cousin   . Breast cancer Cousin   . Breast cancer Cousin     Review of Systems: A 12-system review of systems was performed and was negative except as noted in the  HPI.  --------------------------------------------------------------------------------------------------  Physical Exam: BP 116/68 (BP Location: Left Arm, Patient Position: Sitting, Cuff Size: Normal)   Pulse 68   Ht 5\' 2"  (1.575 m)   Wt 119 lb (54 kg)   BMI 21.77 kg/m   General:  NAD. HEENT: No conjunctival pallor or scleral icterus. Facemask in place. Neck: Supple without lymphadenopathy, thyromegaly, JVD, or HJR. Lungs: Normal work of breathing. Clear to auscultation bilaterally without wheezes or crackles. Heart: Regular rate and rhythm without murmurs, rubs, or gallops. Abd: Bowel sounds present. Soft, NT/ND without hepatosplenomegaly Ext: No lower extremity edema. Radial, PT, and DP pulses are 2+ bilaterally.  EKG:  NSR with septal infarct versus lead placement.  No significant change from prior tracing on 11/27/2018.  Lab Results  Component Value Date   WBC 4.2 10/21/2019   HGB 14.6 10/21/2019   HCT 45.8 10/21/2019   MCV 93 10/21/2019   PLT 231 10/21/2019    Lab Results  Component Value Date   NA 136 10/21/2019   K 5.1 10/21/2019   CL 100 10/21/2019   CO2 25 10/21/2019   BUN 14 10/21/2019   CREATININE 0.72 10/21/2019   GLUCOSE 101 (H) 10/21/2019   ALT 22 10/21/2019    Lab Results  Component Value Date  CHOL 146 10/21/2019   HDL 60 10/21/2019   LDLCALC 73 10/21/2019   TRIG 65 10/21/2019   CHOLHDL 2.4 10/21/2019    --------------------------------------------------------------------------------------------------  ASSESSMENT AND PLAN: Coronary artery disease with stable angina: Ms. Player denies chest pain but has experienced some fatigue when she first start exerting her self.  This usually resolves within a few minutes of remaining active.  Exam and EKG today are unrevealing.  We discussed further evaluation with non-invasive and invasive ischemia testing but have agreed to defer this in favor of closer monitoring of her symptoms.  We will plan to continue  current regimen of clopidogrel and isosorbide mononitrate and reassess her symptoms in 3 months.  I have asked Ms. Vantuyl to contact us if fatigue worsens or new symptoms develop in the meantime.  PAD: Ms. Casselman has known cerebrovascular and left subclavian disease, followed by vascular surgery.  She is asymptomatic with normal radial pulses bilaterally.  We will continue clopidogrel and rosuvastatin.  Ms. Ellwood should continue to follow with vascular surgery, as previously arranged.  Hyperlipidemia: LDL just above goal on most recent check last month, though typically < 70 over the prior 2 years.  We will continue rosuvastatin 20 mg daily as well as lifestyle modifications.  Follow-up: Return to clinic in 3 months.  Nelva Bush, MD 11/26/2019 8:06 AM

## 2019-11-26 ENCOUNTER — Encounter: Payer: Self-pay | Admitting: Internal Medicine

## 2019-12-05 ENCOUNTER — Other Ambulatory Visit: Payer: Self-pay | Admitting: Internal Medicine

## 2019-12-07 DIAGNOSIS — I25118 Atherosclerotic heart disease of native coronary artery with other forms of angina pectoris: Secondary | ICD-10-CM

## 2019-12-07 DIAGNOSIS — I208 Other forms of angina pectoris: Secondary | ICD-10-CM

## 2019-12-07 NOTE — Telephone Encounter (Signed)
End, Harrell Gave, MD  You 1 hour ago (2:25 PM)   I think it is reasonable to obtain a cardiac CTA for reassessment of [name]' CAD with stable angina. Anderson Malta, would you be kind enough to help [name] set this up? Please let me know if any questions or concerns arise. Thanks.   [employer] text

## 2019-12-08 MED ORDER — METOPROLOL TARTRATE 100 MG PO TABS: 100.0000 mg | ORAL_TABLET | Freq: Once | ORAL | 0 refills | Status: DC

## 2019-12-09 ENCOUNTER — Ambulatory Visit: Payer: Medicare HMO | Admitting: Gastroenterology

## 2019-12-14 ENCOUNTER — Other Ambulatory Visit: Payer: Self-pay | Admitting: Physician Assistant

## 2019-12-15 ENCOUNTER — Other Ambulatory Visit: Payer: Self-pay | Admitting: Physician Assistant

## 2019-12-15 DIAGNOSIS — Z1231 Encounter for screening mammogram for malignant neoplasm of breast: Secondary | ICD-10-CM

## 2019-12-16 ENCOUNTER — Telehealth (HOSPITAL_COMMUNITY): Payer: Self-pay | Admitting: Emergency Medicine

## 2019-12-16 NOTE — Telephone Encounter (Signed)
Reaching out to patient to offer assistance regarding upcoming cardiac imaging study; pt verbalizes understanding of appt date/time, parking situation and where to check in, pre-test NPO status and medications ordered, and verified current allergies; name and call back number provided for further questions should they arise Marchia Bond RN Navigator Cardiac Imaging [place] [facility] [phone] office [phone] cell   Spoke to husband who says shes "up to snuff" but I encouraged her to call back with questions if any. Clarise Cruz

## 2019-12-17 ENCOUNTER — Other Ambulatory Visit: Payer: Self-pay

## 2019-12-17 ENCOUNTER — Ambulatory Visit
Admission: RE | Admit: 2019-12-17 | Discharge: 2019-12-17 | Disposition: A | Discharge status: Home / Self Care | Payer: Medicare HMO | Source: Ambulatory Visit | Attending: Internal Medicine | Admitting: Internal Medicine

## 2019-12-17 DIAGNOSIS — I25118 Atherosclerotic heart disease of native coronary artery with other forms of angina pectoris: Secondary | ICD-10-CM | POA: Insufficient documentation

## 2019-12-17 DIAGNOSIS — I208 Other forms of angina pectoris: Secondary | ICD-10-CM

## 2019-12-17 LAB — POCT I-STAT CREATININE: Creatinine, Ser: 0.8 mg/dL (ref 0.44–1.00)

## 2019-12-17 MED ORDER — METOPROLOL TARTRATE 5 MG/5ML IV SOLN
10.0000 mg | Freq: Once | INTRAVENOUS | Status: AC
  Administered 2019-12-17: 10 mg via INTRAVENOUS

## 2019-12-17 MED ORDER — IOHEXOL 350 MG/ML SOLN
75.0000 mL | Freq: Once | INTRAVENOUS | Status: AC | PRN
  Administered 2019-12-17: 75 mL via INTRAVENOUS

## 2019-12-17 MED ORDER — NITROGLYCERIN 0.4 MG SL SUBL
0.8000 mg | SUBLINGUAL_TABLET | Freq: Once | SUBLINGUAL | Status: AC
  Administered 2019-12-17: 0.8 mg via SUBLINGUAL

## 2019-12-17 NOTE — Progress Notes (Signed)
Patient tolerated procedure well. Ambulate w/o difficulty. Sitting in chair drinking water provided. Encouraged to drink extra water today and reasoning. Verbalized understanding. All questions answered. ABC intact. No further needs. Discharge from procedure area w/o issues.

## 2019-12-18 ENCOUNTER — Telehealth: Payer: Self-pay

## 2019-12-18 NOTE — Telephone Encounter (Signed)
Called to give the patient Cardiac CTA results and Dr. Darnelle Bos recommendation. [email or web address].

## 2019-12-18 NOTE — Telephone Encounter (Signed)
-----   Message from Nelva Bush, MD sent at 12/18/2019  3:29 PM EDT ----- Please let [name] know that her cardiac CTA shows mild to moderate narrowing in her heart arteries but no severe blockage that would require intervention at this time.  Allowing for differences in technique, the findings are similar to her catheterization in 2019.  A possible area of infection was incidentally noted in her lungs.  If she is having worsening shortness of breath, cough, or fevers, she should reach out to her PCP for further evaluation.  Otherwise, I recommend that [name] continue her current medications and follow-up as previously arranged.

## 2019-12-18 NOTE — Telephone Encounter (Signed)
Patient returning call for results 

## 2019-12-18 NOTE — Telephone Encounter (Signed)
Reviewed results and recommendations with patient and she verbalized understanding with no further questions at this time.  

## 2020-01-01 ENCOUNTER — Other Ambulatory Visit: Payer: Self-pay | Admitting: Gastroenterology

## 2020-01-01 ENCOUNTER — Ambulatory Visit
Admission: RE | Admit: 2020-01-01 | Discharge: 2020-01-01 | Disposition: A | Discharge status: Home / Self Care | Payer: Medicare HMO | Source: Ambulatory Visit

## 2020-01-01 ENCOUNTER — Other Ambulatory Visit: Payer: Self-pay

## 2020-01-01 DIAGNOSIS — Z1231 Encounter for screening mammogram for malignant neoplasm of breast: Secondary | ICD-10-CM | POA: Diagnosis not present

## 2020-01-18 DIAGNOSIS — H401132 Primary open-angle glaucoma, bilateral, moderate stage: Secondary | ICD-10-CM | POA: Diagnosis not present

## 2020-01-29 NOTE — Progress Notes (Signed)
[facility] New Patient Evaluation and Consultation  Referring Provider: Lorrene Reid, PA-C PCP: Lorrene Reid, PA-C Date of Service: 02/01/2020  SUBJECTIVE Chief Complaint: New Patient (Initial Visit) (pt request gyn exam), Urinary Frequency, Urinary Incontinence, and pt complians of low sex drive  History of Present Illness: [name] is a 74 y.o. White or Caucasian female seen as a new patient for urinary frequency and incontinence.    Has tried some kegel exercises for her leakage symptoms. Has seen a urologist for her symptoms and tried Myrbetriq. This helped some (only took for 2 weeks) but then was too expensive (>$100). Has tried some pumpkin seeds and this maybe helped as well but it was difficult to eat so many pumpkin seeds.   Urinary Symptoms: Leaks urine with with a full bladder and with urgency, rare stress incontinence Leaks 0-2 time(s) per days.  Pad use: 1 liners/ mini-pads per day.   She is bothered by her UI symptoms.  Day time voids 6.  Nocturia: 1-2 times per night to void. Voiding dysfunction: she empties her bladder well.  does not use a catheter to empty bladder.  When urinating, she feels she has no difficulties Drinks: 1 cup decaf coffee, decaf tea (sometimes green), water, mixed fruit juice, 1-2 glasses wine per day  UTIs: 0 UTI's in the last year.   Denies history of blood in urine and kidney or bladder stones  Pelvic Organ Prolapse Symptoms:                  She Denies a feeling of a bulge the vaginal area.   Bowel Symptom: Bowel movements: 1 time(s) per day Stool consistency: hard Straining: no.  Splinting: no.  Incomplete evacuation: no.  She Denies accidental bowel leakage / fecal incontinence Bowel regimen: none Last colonoscopy: Date 2 years ago, Results negative  Sexual Function Sexually active: no.  Sexual orientation: Straight She feels that she has low libido.   Pelvic Pain Denies pelvic pain  Past Medical  History:  Past Medical History:  Diagnosis Date  . Anxiety   . Barrett esophagus 2003  . Diverticulitis   . Diverticulosis   . GERD (gastroesophageal reflux disease)   . Glaucoma   . Grave's disease   . H. pylori infection   . Hepatitis A    in her 55 from food  . Hyperlipidemia   . Hypothyroidism   . Increased pressure in the eye   . Insomnia   . Morton's neuroma   . Murmur   . Scoliosis   . Stroke [facility]) 12/2014   no deficits     Past Surgical History:   Past Surgical History:  Procedure Laterality Date  . AORTIC ARCH ANGIOGRAPHY N/A 01/01/2017   Procedure: AORTIC ARCH ANGIOGRAPHY;  Surgeon: Serafina Mitchell, MD;  Location: [facility];  Service: Cardiovascular;  Laterality: N/A;  . APPENDECTOMY  1958  . CATARACT EXTRACTION Left   . CATARACT EXTRACTION  02/12/2018  . CATARACT EXTRACTION W/PHACO Left 07/02/2016   Procedure: CATARACT EXTRACTION PHACO AND INTRAOCULAR LENS PLACEMENT ([place])  Left;  Surgeon: Ronnell Freshwater, MD;  Location: [facility];  Service: Ophthalmology;  Laterality: Left;  . CATARACT EXTRACTION W/PHACO Right 02/12/2018   Procedure: CATARACT EXTRACTION PHACO AND INTRAOCULAR LENS PLACEMENT ([place])  RIGHT;  Surgeon: Leandrew Koyanagi, MD;  Location: [place];  Service: Ophthalmology;  Laterality: Right;  . CERVICAL LAMINECTOMY  1995  . COLON SURGERY    . EYE SURGERY  due to graves disease  . FEMUR SURGERY Right    benign bone growth  . FOOT NEUROMA SURGERY Right   . INTRAVASCULAR PRESSURE WIRE/FFR STUDY N/A 04/19/2017   Procedure: INTRAVASCULAR PRESSURE WIRE/FFR STUDY;  Surgeon: Nelva Bush, MD;  Location: [facility];  Service: Cardiovascular;  Laterality: N/A;  . LAPAROSCOPIC ASSISTED VAGINAL HYSTERECTOMY    . LAPAROSCOPIC SIGMOID COLECTOMY  05/13/06   Dr Zella Richer  . LEFT HEART CATH AND CORONARY ANGIOGRAPHY N/A 04/19/2017   Procedure: LEFT HEART CATH AND CORONARY ANGIOGRAPHY;  Surgeon: Nelva Bush,  MD;  Location: [facility];  Service: Cardiovascular;  Laterality: N/A;  . LUMBAR [place] SURGERY  2009  . PERIPHERAL VASCULAR INTERVENTION Left 01/01/2017   Procedure: PERIPHERAL VASCULAR INTERVENTION;  Surgeon: Serafina Mitchell, MD;  Location: [facility];  Service: Cardiovascular;  Laterality: Left;  . SUBCLAVIAN STENT PLACEMENT    . TONSILLECTOMY  1992  . TRIGGER FINGER RELEASE Right 05/26/2018   Procedure: RIGHT INDEX RELEASE TRIGGER FINGER/A-1 PULLEY;  Surgeon: [name], MD;  Location: [facility];  Service: Orthopedics;  Laterality: Right;  . TUBAL LIGATION    . UPPER EXTREMITY ANGIOGRAPHY Left 01/01/2017   Procedure: Upper Extremity Angiography;  Surgeon: Serafina Mitchell, MD;  Location: [place] [facility];  Service: Cardiovascular;  Laterality: Left;     Past OB/GYN History: G1 P1 Vaginal deliveries: 1,  Forceps/ Vacuum deliveries: 0, Cesarean section: 0 Menopausal: Yes, at age 41, Denies vaginal bleeding since menopause Contraception: n/a. Last pap smear was prior to hysterectomy.  Any history of abnormal pap smears: no. Hysterectomy/ BSO 1992  Medications: She has a current medication list which includes the following prescription(s): acetaminophen, calcium carbonate-vitamin d, clopidogrel, diazepam, dorzolamide-timolol, isosorbide mononitrate, lansoprazole, latanoprost, levothyroxine, lutein, multivitamin, rosuvastatin, and metoprolol tartrate.   Allergies: Patient is allergic to neuromuscular blocking agents, vicodin [hydrocodone-acetaminophen], and morphine and related.   Social History:  Social History   Tobacco Use  . Smoking status: Former Smoker    Packs/day: 0.25    Types: Cigarettes    Quit date: 03/12/1978    Years since quitting: 41.9  . Smokeless tobacco: Never Used  . Tobacco comment: quit in 1978  Vaping Use  . Vaping Use: Never used  Substance Use Topics  . Alcohol use: Yes    Alcohol/week: 7.0 standard drinks    Types: 7  Glasses of wine per week  . Drug use: No    Relationship status: married She lives with husband.   She is not employed. Regular exercise: Yes:   History of abuse: No  Family History:   Family History  Problem Relation Age of Onset  . Other Father 59       SEPSIS  . Hypertension Father   . Stroke Father 19       CVA  . Heart failure Mother 64  . Heart murmur Mother   . Heart attack Sister 17  . Diabetes Brother   . Breast cancer Sister 43  . Stroke Sister   . Cirrhosis Brother   . Hypertension Sister        x 2  . Diabetes Sister   . Heart murmur Daughter   . Breast cancer Maternal Aunt   . Breast cancer Paternal Aunt   . Breast cancer Cousin   . Breast cancer Cousin   . Breast cancer Cousin   . Breast cancer Cousin      Review of Systems: Review of Systems  Constitutional: Negative for fever, malaise/fatigue and weight loss.  Respiratory: Negative for cough, shortness of breath and wheezing.   Cardiovascular: Negative for chest pain, palpitations and leg swelling.  Gastrointestinal: Negative for abdominal pain and blood in stool.  Genitourinary: Negative for dysuria.  Musculoskeletal: Negative for myalgias.  Skin: Negative for rash.  Neurological: Negative for dizziness and headaches.  Endo/Heme/Allergies: Does not bruise/bleed easily.  Psychiatric/Behavioral: Negative for depression. The patient is not nervous/anxious.      OBJECTIVE Physical Exam: Vitals:   02/01/20 1127  BP: 129/73  Weight: 124 lb (56.2 kg)    Physical Exam Constitutional:      General: She is not in acute distress. Pulmonary:     Effort: Pulmonary effort is normal.  Abdominal:     General: There is no distension.     Palpations: Abdomen is soft.     Tenderness: There is no abdominal tenderness. There is no rebound.  Musculoskeletal:        General: No swelling. Normal range of motion.  Skin:    General: Skin is warm and dry.     Findings: No rash.  Neurological:      Mental Status: She is alert and oriented to person, place, and time.  Psychiatric:        Mood and Affect: Mood normal.        Behavior: Behavior normal.     GU / Detailed Urogynecologic Evaluation:  Pelvic Exam: Normal external female genitalia; Bartholin's and Skene's glands normal in appearance; urethral meatus normal in appearance, no urethral masses or discharge.   CST: negative   s/p hysterectomy: Speculum exam reveals normal vaginal mucosa with  atrophy and normal vaginal cuff.  Adnexa no mass, fullness, tenderness.    Pelvic floor strength III/V  Pelvic floor musculature: Right levator non-tender, Right obturator tender, Left levator non-tender, Left obturator tender  POP-Q:   POP-Q  -2.5                                            Aa   -2.5                                           Ba  -6                                              C   2.5                                            Gh  5                                            Pb  7  tvl   -2                                            Ap  -2                                            Bp   (none)                                              D     Rectal Exam:  Normal external rectum  Post-Void Residual (PVR) by Bladder Scan: In order to evaluate bladder emptying, we discussed obtaining a postvoid residual and she agreed to this procedure.  Procedure: The ultrasound unit was placed on the patient's abdomen in the suprapubic region after the patient had voided. A PVR of 13 ml was obtained by bladder scan.  Laboratory Results: POC urine: negative  I visualized the urine specimen, noting the specimen to be clear yellow  ASSESSMENT AND PLAN [name] is a 74 y.o. with:  1. Overactive bladder   2. Urinary urgency   3. Decreased libido     1. OAB We discussed the symptoms of overactive bladder (OAB), which include urinary urgency, urinary frequency,  nocturia, with or without urge incontinence.  While we do not know the exact etiology of OAB, several treatment options exist. We discussed management including behavioral therapy (decreasing bladder irritants, urge suppression strategies, timed voids, bladder retraining), physical therapy, medication; for refractory cases posterior tibial nerve stimulation, sacral neuromodulation, and intravesical botulinum toxin injection.  For anticholinergic medications, we discussed the potential side effects of anticholinergics including dry eyes, dry mouth, constipation, cognitive impairment and urinary retention. For Beta-3 agonist medication, we discussed the potential side effect of elevated blood pressure which is more likely to occur in individuals with uncontrolled hypertension. - She would like to work on reducing bladder irritants and taking pumpkin seed oil to see if this helps her symptoms.  - We discussed that I would not recommend any anticholinergic medications due to her age and risk of dementia. If she wanted to try Myrbetriq again, we could see if we could get approval from her insurance company. She would like to wait on any further therapy at this time,   2. Urgency - Empties bladder well, normal PVR - POC urine shows no sign of UTI  3. Low libido - Due to her age and history of CVA would not recommend any oral hormonal therapy.  - Recommended use of vaginal lubricants to make intercourse more comfortable. She will use coconut oil if needed, declines vaginal estrogen.   She will follow up as needed.   Jaquita Folds, MD   Medical Decision Making:  - Reviewed/ ordered a clinical laboratory test - Review and summation of prior records - Independent review of urine specimen   Time spent: total time 78min with at least 50% face to face with patient.

## 2020-02-01 ENCOUNTER — Encounter: Payer: Self-pay | Admitting: Obstetrics and Gynecology

## 2020-02-01 ENCOUNTER — Ambulatory Visit (INDEPENDENT_AMBULATORY_CARE_PROVIDER_SITE_OTHER): Payer: Medicare HMO | Admitting: Obstetrics and Gynecology

## 2020-02-01 ENCOUNTER — Other Ambulatory Visit: Payer: Self-pay

## 2020-02-01 VITALS — BP 129/73 | Wt 124.0 lb

## 2020-02-01 DIAGNOSIS — R3915 Urgency of urination: Secondary | ICD-10-CM

## 2020-02-01 DIAGNOSIS — R6882 Decreased libido: Secondary | ICD-10-CM | POA: Diagnosis not present

## 2020-02-01 DIAGNOSIS — N3281 Overactive bladder: Secondary | ICD-10-CM

## 2020-02-01 DIAGNOSIS — R69 Illness, unspecified: Secondary | ICD-10-CM | POA: Diagnosis not present

## 2020-02-01 LAB — POCT URINALYSIS DIPSTICK
Appearance: NORMAL
Bilirubin, UA: NEGATIVE
Blood, UA: NEGATIVE
Glucose, UA: NEGATIVE
Ketones, UA: NEGATIVE
Leukocytes, UA: NEGATIVE
Nitrite, UA: NEGATIVE
Odor: NEGATIVE
Protein, UA: NEGATIVE
Spec Grav, UA: 1.015 (ref 1.010–1.025)
Urobilinogen, UA: 0.2 E.U./dL
pH, UA: 5 (ref 5.0–8.0)

## 2020-02-01 NOTE — Patient Instructions (Signed)
We discussed the symptoms of overactive bladder (OAB), which include urinary urgency, urinary frequency, night-time urination, with or without urge incontinence.  We discussed management including behavioral therapy (decreasing bladder irritants by following a bladder diet, urge suppression strategies, timed voids, bladder retraining), physical therapy, medication; and for refractory cases posterior tibial nerve stimulation, sacral neuromodulation, and intravesical botulinum toxin injection.   Follow up as needed.

## 2020-02-23 NOTE — Progress Notes (Signed)
Follow-up Outpatient Visit Date: 02/24/2020  Primary Care Provider: Lorrene Reid, PA-C [name] [address]  Chief Complaint: Follow-up CAD  HPI:  [name] is a 74 y.o. female with history of  nonobstructive CAD,peripheral vascular disease with right vertebral artery occlusion and left subclavian artery stenosis status post stenting (12/2016),stroke in 2016, hyperlipidemia, hypothyroidism, and GERD, who presents for follow-up of coronary artery disease.  I last saw her in September, at which time she noted some chest discomfort when she first started activities.  She initially declined further evaluation but ultimately agreed to proceed with cardiac CTA..  This showed mild to moderate, nonobstructive coronary artery disease involving the LAD, ramus intermedius, and LCx with a high calcium score.  Today, Ms. Vaquerano reports that she feels about the same as at our last visit.  She has been doing more exercise classes and last walking and as a result has felt less chest discomfort, as it typically only happens when she walks uphill.  She wonders if it could be related to reflux.  She denies shortness of breath, palpitations, lightheadedness, and edema.   --------------------------------------------------------------------------------------------------  Cardiovascular History & Procedures: Cardiovascular Problems:  Peripheral vascular disease  Stroke  Stable angina with nonobstructive coronary artery disease  Risk Factors:  Peripheral and cerebrovascular disease, hyperlipidemia, and age greater than 69  Cath/PCI:  LHC (04/19/17): LMCA normal. LAD with sequential 50% and 25% lesions (FFR 0.92). Ramus with minimal disease. LCx normal. RCA with mild diffuse disease. LVEDP 17 mmHg. LVEF 55-65%.  CV Surgery:  Aortography and left subclavian angiography; intervention (01/01/17, Dr. Trula Slade): Occluded right vertebral artery. 80% left subclavian artery  stenosis with successful intervention with Xpress 8 x 25 mm balloon-expandable stent.  EP Procedures and Devices:  30-day event monitor (11/28/16): Sinus rhythm. No significant arrhythmia.  Non-Invasive Evaluation(s):  Cardiac CTA (12/17/2019): LMCA normal.  LAD with calcified plaque and 25-49% stenosis involving the proximal and mid segments.  Ramus intermedius with less than 25% calcified stenosis of the mid vessel.  LCx with less than 25% stenosis involving the mid vessel.  Normal RCA.  Coronary calcium score 302 (79th percentile).  Incidental note made of clustered area of tree-in-bud nodularity in the superior segment of the left lower lobe.  Findings most likely reflect inflammatory/infectious bronchiolitis.  Carotid Doppler (02/17/18): 1-39% stenosis of both internal carotid arteries. Right vertebral artery is occluded. Left vertebral artery shows antegrade flow. No significant subclavian artery stenosis bilaterally.  Exercise MPI (11/22/16): Low risk study with small in size, mild in severity, fixed apical anterior and apical defect likely representing apical thinning. LVEF >65% with normal wall motion. Patient complained of fatigue during test, exercising 5:30 minutes.  Upper extremity arterial Doppler (10/26/16): Right upper extremity evaluation normal. Left upper extremity demonstrates dampened biphasic to monophasic waveforms and 20 mmHg pressure difference compared to the right.  Carotid artery duplex (10/18/16): Bilateral 1-39% stenoses involving the right and left internal carotid arteries.  Transthoracic echocardiogram (12/28/14): Normal LV size and function. LVEF 60-65% with grade 1 diastolic dysfunction. Normal RV size and function. No significant vascular abnormalities.   Recent CV Pertinent Labs: Lab Results  Component Value Date   CHOL 146 10/21/2019   HDL 60 10/21/2019   LDLCALC 73 10/21/2019   TRIG 65 10/21/2019   CHOLHDL 2.4 10/21/2019   CHOLHDL 3.9 12/27/2014    INR 1.0 04/12/2017   K 5.1 10/21/2019   BUN 14 10/21/2019   CREATININE 0.80 12/17/2019    Past  medical and surgical history were reviewed and updated in EPIC.  Current Meds  Medication Sig  . acetaminophen (TYLENOL) 500 MG tablet Take 500 mg by mouth daily as needed for moderate pain or headache.   . Calcium Citrate-Vitamin D (CALCIUM CITRATE + PO) Take by mouth daily at 12 noon.  . clopidogrel (PLAVIX) 75 MG tablet TAKE 1 TABLET BY MOUTH EVERY DAY  . diazepam (VALIUM) 5 MG tablet Take 1 tablet (5 mg total) by mouth at bedtime as needed for anxiety (sleep).  . dorzolamide-timolol (COSOPT) 22.3-6.8 MG/ML ophthalmic solution Place into both eyes daily.  . isosorbide mononitrate (IMDUR) 30 MG 24 hr tablet TAKE 0.5 TABLETS (15 MG TOTAL) BY MOUTH DAILY.  Marland Kitchen lansoprazole (PREVACID) 30 MG capsule Take 30 mg by mouth. One capsule 3 times weekly  . latanoprost (XALATAN) 0.005 % ophthalmic solution Place 1 drop into both eyes at bedtime.  Marland Kitchen levothyroxine (SYNTHROID) 88 MCG tablet Take 88 mcg by mouth daily.  . Lutein 20 MG TABS Take 1 tablet by mouth daily.  . Multiple Vitamin (MULTIVITAMIN) tablet Take 1 tablet by mouth daily.  . Multiple Vitamins-Minerals (PRESERVISION AREDS 2 PO) Take by mouth daily at 8 pm.  . rosuvastatin (CRESTOR) 20 MG tablet TAKE 1 TABLET BY MOUTH EVERY DAY    Allergies: Neuromuscular blocking agents, Vicodin [hydrocodone-acetaminophen], and Morphine and related  Social History   Tobacco Use  . Smoking status: Former Smoker    Packs/day: 0.25    Types: Cigarettes    Quit date: 03/12/1978    Years since quitting: 41.9  . Smokeless tobacco: Never Used  . Tobacco comment: quit in 1978  Vaping Use  . Vaping Use: Never used  Substance Use Topics  . Alcohol use: Yes    Alcohol/week: 7.0 standard drinks    Types: 7 Glasses of wine per week  . Drug use: No    Family History  Problem Relation Age of Onset  . Other Father 7       SEPSIS  . Hypertension Father   .  Stroke Father 48       CVA  . Heart failure Mother 45  . Heart murmur Mother   . Heart attack Sister 39  . Diabetes Brother   . Breast cancer Sister 6  . Stroke Sister   . Cirrhosis Brother   . Hypertension Sister        x 2  . Diabetes Sister   . Heart murmur Daughter   . Breast cancer Maternal Aunt   . Breast cancer Paternal Aunt   . Breast cancer Cousin   . Breast cancer Cousin   . Breast cancer Cousin   . Breast cancer Cousin     Review of Systems: A 12-system review of systems was performed and was negative except as noted in the HPI.  --------------------------------------------------------------------------------------------------  Physical Exam: BP 110/62 (BP Location: Left Arm, Patient Position: Sitting, Cuff Size: Normal)   Pulse 70   Ht 5\' 2"  (1.575 m)   Wt 120 lb 4 oz (54.5 kg)   SpO2 98%   BMI 21.99 kg/m   General:  NAD. HEENT: No conjunctival pallor or scleral icterus. Facemask in place. Neck: Supple without lymphadenopathy, thyromegaly, JVD, or HJR. Lungs: Normal work of breathing. Clear to auscultation bilaterally without wheezes or crackles. Heart: Regular rate and rhythm without murmurs, rubs, or gallops. Abd: Bowel sounds present. Soft, NT/ND without hepatosplenomegaly Ext: No lower extremity edema. 2+ radial pulses bilaterally.  Lab Results  Component  Value Date   WBC 4.2 10/21/2019   HGB 14.6 10/21/2019   HCT 45.8 10/21/2019   MCV 93 10/21/2019   PLT 231 10/21/2019    Lab Results  Component Value Date   NA 136 10/21/2019   K 5.1 10/21/2019   CL 100 10/21/2019   CO2 25 10/21/2019   BUN 14 10/21/2019   CREATININE 0.80 12/17/2019   GLUCOSE 101 (H) 10/21/2019   ALT 22 10/21/2019    Lab Results  Component Value Date   CHOL 146 10/21/2019   HDL 60 10/21/2019   LDLCALC 73 10/21/2019   TRIG 65 10/21/2019   CHOLHDL 2.4 10/21/2019     --------------------------------------------------------------------------------------------------  ASSESSMENT AND PLAN: Coronary artery disease with stable angina: Overall, symptoms are stable to slightly better than at our last visit with interval cardiac CTA showing relatively stable nonobstructive CAD.  We will continue isosorbide mononitrate for possible component of microvascular dysfunction.  PAD: Continue follow-up with Dr. Trula Slade.  Ms. Ehler remains on clopidogrel for antiplatelet therapy.  Hyperlipidemia: LDL just above goal on last check in August.  We will escalate rosuvastatin 40 mg daily with repeat lipid panel and ALT in about 3 months.  Follow-up: Return to clinic in 6 months.  Nelva Bush, MD 02/24/2020 11:01 AM

## 2020-02-24 ENCOUNTER — Encounter: Payer: Self-pay | Admitting: Internal Medicine

## 2020-02-24 ENCOUNTER — Ambulatory Visit: Payer: Medicare HMO | Admitting: Internal Medicine

## 2020-02-24 ENCOUNTER — Other Ambulatory Visit: Payer: Self-pay

## 2020-02-24 VITALS — BP 110/62 | HR 70 | Ht 62.0 in | Wt 120.2 lb

## 2020-02-24 DIAGNOSIS — I739 Peripheral vascular disease, unspecified: Secondary | ICD-10-CM | POA: Diagnosis not present

## 2020-02-24 DIAGNOSIS — I25118 Atherosclerotic heart disease of native coronary artery with other forms of angina pectoris: Secondary | ICD-10-CM | POA: Diagnosis not present

## 2020-02-24 DIAGNOSIS — E785 Hyperlipidemia, unspecified: Secondary | ICD-10-CM

## 2020-02-24 MED ORDER — ROSUVASTATIN CALCIUM 40 MG PO TABS: 40.0000 mg | ORAL_TABLET | Freq: Every day | ORAL | 2 refills | Status: DC

## 2020-02-24 NOTE — Patient Instructions (Signed)
Medication Instructions:  Your physician has recommended you make the following change in your medication:  INCREASE Crestor to 40 mg by mouth once a day.  *If you need a refill on your cardiac medications before your next appointment, please call your pharmacy*  Lab Work: Your physician recommends that you return for lab work in: [place]  ~ May 24, 2020. - You will need to be fasting. Please do not have anything to eat or drink after midnight the morning you have the lab work. You may only have water or black coffee with no cream or sugar. - Please go to the [facility]. You will check in at the front desk to the right as you walk into the atrium. Valet Parking is offered if needed. - No appointment needed. You may go any day between 7 am and 6 pm.  If you have labs (blood work) drawn today and your tests are completely normal, you will receive your results only by:  [place] (if you have MyChart) OR  A paper copy in the mail If you have any lab test that is abnormal or we need to change your treatment, we will call you to review the results.  Testing/Procedures: NONE  Follow-Up: At [facility], you and your health needs are our priority.  As part of our continuing mission to provide you with exceptional heart care, we have created designated Provider Care Teams.  These Care Teams include your primary Cardiologist (physician) and Advanced Practice Providers (APPs -  Physician Assistants and Nurse Practitioners) who all work together to provide you with the care you need, when you need it.  We recommend signing up for the patient portal called "MyChart".  Sign up information is provided on this After Visit Summary.  MyChart is used to connect with patients for Virtual Visits (Telemedicine).  Patients are able to view lab/test results, encounter notes, upcoming appointments, etc.  Non-urgent messages can be sent to your provider as well.   To learn more about what you can  do with MyChart, go to [email or web address].    Your next appointment:   6 month(s)  The format for your next appointment:   In Person  Provider:   You may see Nelva Bush, MD or one of the following Advanced Practice Providers on your designated Care Team:    Murray Hodgkins, NP  Christell Faith, PA-C  Marrianne Mood, PA-C  Cadence [place], [place]  Laurann [place], NP

## 2020-02-25 ENCOUNTER — Encounter: Payer: Self-pay | Admitting: Internal Medicine

## 2020-03-14 ENCOUNTER — Other Ambulatory Visit: Payer: Self-pay | Admitting: Physician Assistant

## 2020-03-18 DIAGNOSIS — H401133 Primary open-angle glaucoma, bilateral, severe stage: Secondary | ICD-10-CM | POA: Diagnosis not present

## 2020-04-04 ENCOUNTER — Ambulatory Visit: Payer: Medicare HMO | Admitting: Physician Assistant

## 2020-04-04 ENCOUNTER — Other Ambulatory Visit: Payer: Self-pay

## 2020-04-04 ENCOUNTER — Ambulatory Visit (HOSPITAL_COMMUNITY)
Admission: RE | Admit: 2020-04-04 | Discharge: 2020-04-04 | Disposition: A | Discharge status: Home / Self Care | Payer: Medicare HMO | Source: Ambulatory Visit | Attending: Physician Assistant | Admitting: Physician Assistant

## 2020-04-04 ENCOUNTER — Encounter: Payer: Self-pay | Admitting: Physician Assistant

## 2020-04-04 VITALS — BP 120/74 | HR 75 | Temp 98.2°F | Resp 20 | Ht 62.0 in | Wt 124.1 lb

## 2020-04-04 DIAGNOSIS — I6529 Occlusion and stenosis of unspecified carotid artery: Secondary | ICD-10-CM

## 2020-04-04 DIAGNOSIS — I771 Stricture of artery: Secondary | ICD-10-CM

## 2020-04-04 NOTE — Progress Notes (Signed)
History of Present Illness:  Patient is a 75 y.o. year old female who presents for evaluation of carotid stenosis.  S/P arteriogram on 01/01/2017. A greater than 80% stenosis was identified in the proximal portion of the left subclavian, proximal to the vertebral artery. This was treated with primary stenting using 8 x 25 balloon expandable stent.     The patient denies symptoms of TIA, amaurosis, or stroke.   She stays active with exercise and volunteers  at the hospital.  She takes ASA and a Statin daily.  Past Medical History:  Diagnosis Date  . Anxiety   . Barrett esophagus 2003  . Diverticulitis   . Diverticulosis   . GERD (gastroesophageal reflux disease)   . Glaucoma   . Grave's disease   . H. pylori infection   . Hepatitis A    in her 78 from food  . Hyperlipidemia   . Hypothyroidism   . Increased pressure in the eye   . Insomnia   . Morton's neuroma   . Murmur   . Scoliosis   . Stroke [facility]) 12/2014   no deficits    Past Surgical History:  Procedure Laterality Date  . AORTIC ARCH ANGIOGRAPHY N/A 01/01/2017   Procedure: AORTIC ARCH ANGIOGRAPHY;  Surgeon: Serafina Mitchell, MD;  Location: [place] [facility];  Service: Cardiovascular;  Laterality: N/A;  . APPENDECTOMY  1958  . CATARACT EXTRACTION Left   . CATARACT EXTRACTION  02/12/2018  . CATARACT EXTRACTION W/PHACO Left 07/02/2016   Procedure: CATARACT EXTRACTION PHACO AND INTRAOCULAR LENS PLACEMENT ([place])  Left;  Surgeon: Ronnell Freshwater, MD;  Location: [place];  Service: Ophthalmology;  Laterality: Left;  . CATARACT EXTRACTION W/PHACO Right 02/12/2018   Procedure: CATARACT EXTRACTION PHACO AND INTRAOCULAR LENS PLACEMENT ([place])  RIGHT;  Surgeon: Leandrew Koyanagi, MD;  Location: [place];  Service: Ophthalmology;  Laterality: Right;  . CERVICAL LAMINECTOMY  1995  . COLON SURGERY    . EYE SURGERY     due to graves disease  . FEMUR SURGERY Right    benign bone growth  . FOOT  NEUROMA SURGERY Right   . INTRAVASCULAR PRESSURE WIRE/FFR STUDY N/A 04/19/2017   Procedure: INTRAVASCULAR PRESSURE WIRE/FFR STUDY;  Surgeon: Nelva Bush, MD;  Location: [place] [facility];  Service: Cardiovascular;  Laterality: N/A;  . LAPAROSCOPIC ASSISTED VAGINAL HYSTERECTOMY    . LAPAROSCOPIC SIGMOID COLECTOMY  05/13/06   Dr Zella Richer  . LEFT HEART CATH AND CORONARY ANGIOGRAPHY N/A 04/19/2017   Procedure: LEFT HEART CATH AND CORONARY ANGIOGRAPHY;  Surgeon: Nelva Bush, MD;  Location: [place] [facility];  Service: Cardiovascular;  Laterality: N/A;  . LUMBAR [place] SURGERY  2009  . PERIPHERAL VASCULAR INTERVENTION Left 01/01/2017   Procedure: PERIPHERAL VASCULAR INTERVENTION;  Surgeon: Serafina Mitchell, MD;  Location: [place] [facility];  Service: Cardiovascular;  Laterality: Left;  . SUBCLAVIAN STENT PLACEMENT    . TONSILLECTOMY  1992  . TRIGGER FINGER RELEASE Right 05/26/2018   Procedure: RIGHT INDEX RELEASE TRIGGER FINGER/A-1 PULLEY;  Surgeon: [name], MD;  Location: [place];  Service: Orthopedics;  Laterality: Right;  . TUBAL LIGATION    . UPPER EXTREMITY ANGIOGRAPHY Left 01/01/2017   Procedure: Upper Extremity Angiography;  Surgeon: Serafina Mitchell, MD;  Location: [place] [facility];  Service: Cardiovascular;  Laterality: Left;     Social History Social History   Tobacco Use  . Smoking status: Former Smoker    Packs/day: 0.25  Types: Cigarettes    Quit date: 03/12/1978    Years since quitting: 42.0  . Smokeless tobacco: Never Used  . Tobacco comment: quit in 1978  Vaping Use  . Vaping Use: Never used  Substance Use Topics  . Alcohol use: Yes    Alcohol/week: 7.0 standard drinks    Types: 7 Glasses of wine per week  . Drug use: No    Family History Family History  Problem Relation Age of Onset  . Other Father 75       SEPSIS  . Hypertension Father   . Stroke Father 65       CVA  . Heart failure Mother 74  . Heart murmur Mother   .  Heart attack Sister 11  . Diabetes Brother   . Breast cancer Sister 36  . Stroke Sister   . Cirrhosis Brother   . Hypertension Sister        x 2  . Diabetes Sister   . Heart murmur Daughter   . Breast cancer Maternal Aunt   . Breast cancer Paternal Aunt   . Breast cancer Cousin   . Breast cancer Cousin   . Breast cancer Cousin   . Breast cancer Cousin     Allergies  Allergies  Allergen Reactions  . Neuromuscular Blocking Agents Other (See Comments)    Multiple symptoms, SOB, fatigue, weakness  . Vicodin [Hydrocodone-Acetaminophen] Other (See Comments)    Altered mental state  . Morphine And Related Rash     Current Outpatient Medications  Medication Sig Dispense Refill  . acetaminophen (TYLENOL) 500 MG tablet Take 500 mg by mouth daily as needed for moderate pain or headache.     . Calcium Citrate-Vitamin D (CALCIUM CITRATE + PO) Take by mouth daily at 12 noon.    . clopidogrel (PLAVIX) 75 MG tablet TAKE 1 TABLET BY MOUTH EVERY DAY 90 tablet 0  . diazepam (VALIUM) 5 MG tablet Take 1 tablet (5 mg total) by mouth at bedtime as needed for anxiety (sleep). 30 tablet 0  . dorzolamide-timolol (COSOPT) 22.3-6.8 MG/ML ophthalmic solution Place into both eyes daily.    . isosorbide mononitrate (IMDUR) 30 MG 24 hr tablet TAKE 0.5 TABLETS (15 MG TOTAL) BY MOUTH DAILY. 30 tablet 2  . lansoprazole (PREVACID) 30 MG capsule Take 30 mg by mouth. One capsule 3 times weekly    . latanoprost (XALATAN) 0.005 % ophthalmic solution Place 1 drop into both eyes at bedtime.    Marland Kitchen levothyroxine (SYNTHROID) 88 MCG tablet Take 88 mcg by mouth daily.    . Lutein 20 MG TABS Take 1 tablet by mouth daily.    . Multiple Vitamin (MULTIVITAMIN) tablet Take 1 tablet by mouth daily.    . Multiple Vitamins-Minerals (PRESERVISION AREDS 2 PO) Take by mouth daily at 8 pm.    . rosuvastatin (CRESTOR) 40 MG tablet Take 1 tablet (40 mg total) by mouth daily. 90 tablet 2   No current facility-administered medications  for this visit.    ROS:   General:  No weight loss, Fever, chills  HEENT: No recent headaches, no nasal bleeding, no visual changes, no sore throat  Neurologic: No dizziness, blackouts, seizures. No recent symptoms of stroke or mini- stroke. No recent episodes of slurred speech, or temporary blindness.  Cardiac: No recent episodes of chest pain/pressure, no shortness of breath at rest.  No shortness of breath with exertion.  Denies history of atrial fibrillation or irregular heartbeat  Vascular: No history of rest  pain in feet.  No history of claudication.  No history of non-healing ulcer, No history of DVT   Pulmonary: No home oxygen, no productive cough, no hemoptysis,  No asthma or wheezing  Musculoskeletal:  [ ]  Arthritis, [ ]  Low back pain,  [ ]  Joint pain  Hematologic:No history of hypercoagulable state.  No history of easy bleeding.  No history of anemia  Gastrointestinal: No hematochezia or melena,  No gastroesophageal reflux, no trouble swallowing  Urinary: [ ]  chronic Kidney disease, [ ]  on HD - [ ]  MWF or [ ]  TTHS, [ ]  Burning with urination, [ ]  Frequent urination, [ ]  Difficulty urinating;   Skin: No rashes  Psychological: No history of anxiety,  No history of depression   Physical Examination  Vitals:   04/04/20 1308 04/04/20 1310  BP: 125/76 120/74  Pulse: 75   Resp: 20   Temp: 98.2 F (36.8 C)   TempSrc: Temporal   SpO2: 97%   Weight: 124 lb 1.6 oz (56.3 kg)   Height: 5\' 2"  (1.575 m)     Body mass index is 22.7 kg/m.  General:  Alert and oriented, no acute distress HEENT: Normal Neck: No bruit or JVD Pulmonary: Clear to auscultation bilaterally Cardiac: Regular Rate and Rhythm without murmur Gastrointestinal: Soft, non-tender, non-distended, no mass, no scars Skin: No rash Extremity Pulses:  2+ radial, brachial, femoral, dorsalis pedis, posterior tibial pulses bilaterally Musculoskeletal: No deformity or edema  Neurologic: Upper and lower  extremity motor 5/5 and symmetric  DATA:    Right Carotid Findings:  +----------+--------+--------+--------+------------------+--------+       PSV cm/sEDV cm/sStenosisPlaque DescriptionComments  +----------+--------+--------+--------+------------------+--------+  CCA Prox 73   13       homogeneous          +----------+--------+--------+--------+------------------+--------+  CCA Distal81   12                      +----------+--------+--------+--------+------------------+--------+  ICA Prox 87   16   1-39%  homogeneous          +----------+--------+--------+--------+------------------+--------+  ICA Mid  72   13                      +----------+--------+--------+--------+------------------+--------+  ICA Distal80   17                      +----------+--------+--------+--------+------------------+--------+  ECA    75   10                      +----------+--------+--------+--------+------------------+--------+   +----------+--------+-------+----------------+-------------------+       PSV cm/sEDV cmsDescribe    Arm Pressure (mmHG)  +----------+--------+-------+----------------+-------------------+  [id number]       Multiphasic, WNL            +----------+--------+-------+----------------+-------------------+   +---------+--------+--------+------------------+  VertebralPSV cm/sEDV cm/sNo flow identified  +---------+--------+--------+------------------+      Left Carotid Findings:  +----------+--------+--------+--------+------------------+--------+       PSV cm/sEDV cm/sStenosisPlaque DescriptionComments  +----------+--------+--------+--------+------------------+--------+  CCA Prox 91   23       homogeneous           +----------+--------+--------+--------+------------------+--------+  CCA Distal93   17                      +----------+--------+--------+--------+------------------+--------+  ICA Prox 75   15   1-39%  homogeneous          +----------+--------+--------+--------+------------------+--------+  ICA Mid  80  19                      +----------+--------+--------+--------+------------------+--------+  ICA Distal95   25                      +----------+--------+--------+--------+------------------+--------+  ECA    65   7                       +----------+--------+--------+--------+------------------+--------+   +----------+--------+--------+----------------+-------------------+       PSV cm/sEDV cm/sDescribe    Arm Pressure (mmHG)  +----------+--------+--------+----------------+-------------------+  [id number]       Multiphasic, WNL            +----------+--------+--------+----------------+-------------------+   +---------+--------+--+--------+--+---------+  VertebralPSV cm/s66EDV cm/s13Antegrade  +---------+--------+--+--------+--+---------+         Summary:  Right Carotid: Velocities in the right ICA are consistent with a 1-39%  stenosis.   Left Carotid: Velocities in the left ICA are consistent with a 1-39%  stenosis.   Vertebrals: Left vertebral artery demonstrates antegrade flow. Right  vertebral        artery demonstrates no discernable flow.  Subclavians: Normal flow hemodynamics were seen in bilateral subclavian        arteries.   ASSESSMENT:  S/P primary stenting using 8 x 25 balloon expandable stent For greater than 80% stenosis was identified in the proximal portion of the left subclavian, proximal to the vertebral artery.  Discovered secondary to BP  differences.  The carotid duplex is unchanged and she remains asymptomatic.    PLAN: Cont. Daily activity, ASA and Statin.  If she develops symptoms of stroke/TIA or changes in BP with or without changes in inflow in the left UE she will call our office.  Other wise she will f/u in 1 year for repeat carotid duplex.     Roxy Horseman PA-C [facility] Office: [phone]  MD in clinic [place]

## 2020-04-20 ENCOUNTER — Ambulatory Visit (INDEPENDENT_AMBULATORY_CARE_PROVIDER_SITE_OTHER): Payer: Medicare HMO | Admitting: Physician Assistant

## 2020-04-20 ENCOUNTER — Other Ambulatory Visit: Payer: Self-pay

## 2020-04-20 ENCOUNTER — Encounter: Payer: Self-pay | Admitting: Physician Assistant

## 2020-04-20 VITALS — BP 112/66 | HR 76 | Temp 97.6°F | Ht 62.0 in | Wt 123.4 lb

## 2020-04-20 DIAGNOSIS — F339 Major depressive disorder, recurrent, unspecified: Secondary | ICD-10-CM

## 2020-04-20 DIAGNOSIS — R7303 Prediabetes: Secondary | ICD-10-CM

## 2020-04-20 DIAGNOSIS — E039 Hypothyroidism, unspecified: Secondary | ICD-10-CM

## 2020-04-20 DIAGNOSIS — E785 Hyperlipidemia, unspecified: Secondary | ICD-10-CM

## 2020-04-20 DIAGNOSIS — I739 Peripheral vascular disease, unspecified: Secondary | ICD-10-CM

## 2020-04-20 DIAGNOSIS — R69 Illness, unspecified: Secondary | ICD-10-CM | POA: Diagnosis not present

## 2020-04-20 DIAGNOSIS — I25118 Atherosclerotic heart disease of native coronary artery with other forms of angina pectoris: Secondary | ICD-10-CM

## 2020-04-20 NOTE — Progress Notes (Signed)
Established Patient Office Visit  Subjective:  Patient ID: [name], female    DOB: Jul 18, 1945  Age: 75 y.o. MRN: [id number]  CC:  Chief Complaint  Patient presents with  . Hyperlipidemia  . Thyroid Problem  . Prediabetes    HPI [name] presents for follow up on hyperlipidemia, prediabetes and hypothyroidism.  HLD: Pt reports has been experiencing constipation with increased dose of rosuvastatin.  Continues to follow a heart healthy diet.  Denies worsening angina, palpitations or edema  Prediabetes: Denies increased thirst or urination.  Continues to stay active with fitness classes.  Hypothyroidism: Followed by endocrinology, Dr. Chalmers Cater.  Patient reports has noticed feeling more tired and hair thinning.  Plans to follow-up with Dr. Chalmers Cater.  Reports medication compliance.  Past Medical History:  Diagnosis Date  . Anxiety   . Barrett esophagus 2003  . Diverticulitis   . Diverticulosis   . GERD (gastroesophageal reflux disease)   . Glaucoma   . Grave's disease   . H. pylori infection   . Hepatitis A    in her 71 from food  . Hyperlipidemia   . Hypothyroidism   . Increased pressure in the eye   . Insomnia   . Morton's neuroma   . Murmur   . Scoliosis   . Stroke [facility]) 12/2014   no deficits    Past Surgical History:  Procedure Laterality Date  . AORTIC ARCH ANGIOGRAPHY N/A 01/01/2017   Procedure: AORTIC ARCH ANGIOGRAPHY;  Surgeon: Serafina Mitchell, MD;  Location: [place] [facility];  Service: Cardiovascular;  Laterality: N/A;  . APPENDECTOMY  1958  . CATARACT EXTRACTION Left   . CATARACT EXTRACTION  02/12/2018  . CATARACT EXTRACTION W/PHACO Left 07/02/2016   Procedure: CATARACT EXTRACTION PHACO AND INTRAOCULAR LENS PLACEMENT ([place])  Left;  Surgeon: Ronnell Freshwater, MD;  Location: [place];  Service: Ophthalmology;  Laterality: Left;  . CATARACT EXTRACTION W/PHACO Right 02/12/2018   Procedure: CATARACT EXTRACTION PHACO AND INTRAOCULAR  LENS PLACEMENT ([place])  RIGHT;  Surgeon: Leandrew Koyanagi, MD;  Location: [place];  Service: Ophthalmology;  Laterality: Right;  . CERVICAL LAMINECTOMY  1995  . COLON SURGERY    . EYE SURGERY     due to graves disease  . FEMUR SURGERY Right    benign bone growth  . FOOT NEUROMA SURGERY Right   . INTRAVASCULAR PRESSURE WIRE/FFR STUDY N/A 04/19/2017   Procedure: INTRAVASCULAR PRESSURE WIRE/FFR STUDY;  Surgeon: Nelva Bush, MD;  Location: [place] [facility];  Service: Cardiovascular;  Laterality: N/A;  . LAPAROSCOPIC ASSISTED VAGINAL HYSTERECTOMY    . LAPAROSCOPIC SIGMOID COLECTOMY  05/13/06   Dr Zella Richer  . LEFT HEART CATH AND CORONARY ANGIOGRAPHY N/A 04/19/2017   Procedure: LEFT HEART CATH AND CORONARY ANGIOGRAPHY;  Surgeon: Nelva Bush, MD;  Location: [place] [facility];  Service: Cardiovascular;  Laterality: N/A;  . LUMBAR [place] SURGERY  2009  . PERIPHERAL VASCULAR INTERVENTION Left 01/01/2017   Procedure: PERIPHERAL VASCULAR INTERVENTION;  Surgeon: Serafina Mitchell, MD;  Location: [place] [facility];  Service: Cardiovascular;  Laterality: Left;  . SUBCLAVIAN STENT PLACEMENT    . TONSILLECTOMY  1992  . TRIGGER FINGER RELEASE Right 05/26/2018   Procedure: RIGHT INDEX RELEASE TRIGGER FINGER/A-1 PULLEY;  Surgeon: [name], MD;  Location: [place];  Service: Orthopedics;  Laterality: Right;  . TUBAL LIGATION    . UPPER EXTREMITY ANGIOGRAPHY Left 01/01/2017   Procedure: Upper Extremity Angiography;  Surgeon: Serafina Mitchell,  MD;  Location: [place] [facility];  Service: Cardiovascular;  Laterality: Left;    Family History  Problem Relation Age of Onset  . Other Father 18       SEPSIS  . Hypertension Father   . Stroke Father 73       CVA  . Heart failure Mother 47  . Heart murmur Mother   . Heart attack Sister 24  . Diabetes Brother   . Breast cancer Sister 76  . Stroke Sister   . Cirrhosis Brother   . Hypertension Sister        x 2  .  Diabetes Sister   . Heart murmur Daughter   . Breast cancer Maternal Aunt   . Breast cancer Paternal Aunt   . Breast cancer Cousin   . Breast cancer Cousin   . Breast cancer Cousin   . Breast cancer Cousin     Social History   Socioeconomic History  . Marital status: Married    Spouse name: Not on file  . Number of children: 1  . Years of education: Not on file  . Highest education level: Not on file  Occupational History  . Occupation: Merchant navy officer    Comment: @ [employer] [address] [employer] on [employer]  Tobacco Use  . Smoking status: Former Smoker    Packs/day: 0.25    Types: Cigarettes    Quit date: 03/12/1978    Years since quitting: 42.1  . Smokeless tobacco: Never Used  . Tobacco comment: quit in 1978  Vaping Use  . Vaping Use: Never used  Substance and Sexual Activity  . Alcohol use: Yes    Alcohol/week: 7.0 standard drinks    Types: 7 Glasses of wine per week  . Drug use: No  . Sexual activity: Not Currently    Birth control/protection: None  Other Topics Concern  . Not on file  Social History Narrative  . Not on file   Social Determinants of Health   Financial Resource Strain: Not on file  Food Insecurity: Not on file  Transportation Needs: Not on file  Physical Activity: Not on file  Stress: Not on file  Social Connections: Not on file  Intimate Partner Violence: Not on file    Outpatient Medications Prior to Visit  Medication Sig Dispense Refill  . acetaminophen (TYLENOL) 500 MG tablet Take 500 mg by mouth daily as needed for moderate pain or headache.     . Calcium Citrate-Vitamin D (CALCIUM CITRATE + PO) Take by mouth daily at 12 noon.    . clopidogrel (PLAVIX) 75 MG tablet TAKE 1 TABLET BY MOUTH EVERY DAY 90 tablet 0  . diazepam (VALIUM) 5 MG tablet Take 1 tablet (5 mg total) by mouth at bedtime as needed for anxiety (sleep). 30 tablet 0  . dorzolamide-timolol (COSOPT) 22.3-6.8 MG/ML ophthalmic solution Place into both eyes daily.    . isosorbide  mononitrate (IMDUR) 30 MG 24 hr tablet TAKE 0.5 TABLETS (15 MG TOTAL) BY MOUTH DAILY. 30 tablet 2  . lansoprazole (PREVACID) 30 MG capsule Take 30 mg by mouth. One capsule 3 times weekly    . latanoprost (XALATAN) 0.005 % ophthalmic solution Place 1 drop into both eyes at bedtime.    Marland Kitchen levothyroxine (SYNTHROID) 88 MCG tablet Take 88 mcg by mouth daily.    . Lutein 20 MG TABS Take 1 tablet by mouth daily.    . Multiple Vitamin (MULTIVITAMIN) tablet Take 1 tablet by mouth daily.    Marland Kitchen  Multiple Vitamins-Minerals (PRESERVISION AREDS 2 PO) Take by mouth daily at 8 pm.    . rosuvastatin (CRESTOR) 40 MG tablet Take 1 tablet (40 mg total) by mouth daily. 90 tablet 2   No facility-administered medications prior to visit.    Allergies  Allergen Reactions  . Neuromuscular Blocking Agents Other (See Comments)    Multiple symptoms, SOB, fatigue, weakness  . Vicodin [Hydrocodone-Acetaminophen] Other (See Comments)    Altered mental state  . Morphine And Related Rash    ROS Review of Systems A fourteen system review of systems was performed and found to be positive as per HPI.   Objective:    Physical Exam General:  Well Developed, well nourished, appropriate for stated age.  Neuro:  Alert and oriented,  extra-ocular muscles intact  HEENT:  Normocephalic, atraumatic, neck supple Skin:  no gross rash, warm, pink. Cardiac:  RRR, S1 S2 w/o murmur Respiratory:  ECTA B/L w/o wheezing, Not using accessory muscles, speaking in full sentences- unlabored. Vascular:  Ext warm, no cyanosis apprec.; cap RF less 2 sec. Psych:  No HI/SI, judgement and insight good, Euthymic mood. Full Affect.  BP 112/66   Pulse 76   Temp 97.6 F (36.4 C)   Ht 5\' 2"  (1.575 m)   Wt 123 lb 6.4 oz (56 kg)   SpO2 98%   BMI 22.57 kg/m  Wt Readings from Last 3 Encounters:  04/20/20 123 lb 6.4 oz (56 kg)  04/04/20 124 lb 1.6 oz (56.3 kg)  02/24/20 120 lb 4 oz (54.5 kg)     Health Maintenance Due  Topic Date Due  .  Hepatitis C Screening  Never done  . TETANUS/TDAP  Never done  . INFLUENZA VACCINE  10/11/2019  . COVID-19 Vaccine (3 - Booster for Pfizer series) 10/27/2019    There are no preventive care reminders to display for this patient.  Lab Results  Component Value Date   TSH 0.693 10/21/2019   Lab Results  Component Value Date   WBC 4.2 10/21/2019   HGB 14.6 10/21/2019   HCT 45.8 10/21/2019   MCV 93 10/21/2019   PLT 231 10/21/2019   Lab Results  Component Value Date   NA 136 10/21/2019   K 5.1 10/21/2019   CO2 25 10/21/2019   GLUCOSE 101 (H) 10/21/2019   BUN 14 10/21/2019   CREATININE 0.80 12/17/2019   BILITOT 0.6 10/21/2019   ALKPHOS 86 10/21/2019   AST 27 10/21/2019   ALT 22 10/21/2019   PROT 7.2 10/21/2019   ALBUMIN 4.7 10/21/2019   CALCIUM 9.7 10/21/2019   ANIONGAP 8 09/23/2017   Lab Results  Component Value Date   CHOL 146 10/21/2019   Lab Results  Component Value Date   HDL 60 10/21/2019   Lab Results  Component Value Date   LDLCALC 73 10/21/2019   Lab Results  Component Value Date   TRIG 65 10/21/2019   Lab Results  Component Value Date   CHOLHDL 2.4 10/21/2019   Lab Results  Component Value Date   HGBA1C 5.9 (H) 10/21/2019      Assessment & Plan:   Problem List Items Addressed This Visit      Cardiovascular and Mediastinum   Coronary artery disease of native artery of native heart with stable angina pectoris (HCC)   PAD (peripheral artery disease) ([place])     Endocrine   Hypothyroidism - Primary    -Followed by Endocrinology, Dr. Chalmers Cater. -On Levothyroxine 88 mcg.  Other   Hyperlipidemia LDL goal <70   Prediabetes    -Last A1c 5.9, stable. -Follow low carbohydrate and glucose diet. -Will continue to monitor.        Coronary artery disease native artery of native heart with stable angina pectoris: -CTA 12/17/2019: IMPRESSION: 1. High coronary calcium score of 302. This was 79th percentile for age and sex matched  control. 2. Normal coronary origin with right dominance. 3. Calcified plaque in the proximal and mid LAD causing mild stenosis (25-49%) 4. Calcified plaque causing minimal stenosis (0-24%) in the Ramus and LCx 5. CAD-RADS 2. Mild non-obstructive CAD (25-49%). Consider non-atherosclerotic causes of chest pain. Consider preventive therapy and risk factor modification. -Followed by cardiology.  On isosorbide mononitrate and rosuvastatin.  PAD: -S/p arteriogram. (8 x 25 balloon expandable stent) -04/04/2020 carotid duplex ultrasound: Summary:  Right Carotid: Velocities in the right ICA are consistent with a 1-39% stenosis.  Left Carotid: Velocities in the left ICA are consistent with a 1-39% stenosis.  Vertebrals: Left vertebral artery demonstrates antegrade flow. Right  vertebral artery demonstrates no discernable flow.  Subclavians: Normal flow hemodynamics were seen in bilateral subclavian arteries.  -On Plavix and Rosuvastatin.  Hyperlipidemia LDL goal <70: -Last lipid panel wnl's, LDL 73 -Advised patient if unable to tolerate rosuvastatin 40 mg to contact Cardiologist. Recommend to continue good hydration and incorporate dietary fiber. Take magnesium or Miralax as needed. -Continue heart healthy diet and physical activity regimen.  Depression, recurrent: -PHQ-9 score of 0.   No orders of the defined types were placed in this encounter.   Follow-up: Return in about 6 months (around 10/18/2020) for MCW and FBW 1 wk prior.    Lorrene Reid, PA-C

## 2020-04-20 NOTE — Assessment & Plan Note (Signed)
>>  ASSESSMENT AND PLAN FOR HYPOTHYROIDISM WRITTEN ON 04/24/2020 10:35 PM BY ABONZA, MARITZA, PA-C  -Followed by Endocrinology, Dr. Chalmers Cater. -On Levothyroxine 88 mcg. -Patient declined obtaining labs today and will contact Dr. Chalmers Cater if symptoms worsen.

## 2020-04-20 NOTE — Assessment & Plan Note (Signed)
-  Last A1c 5.9, stable. -Follow low carbohydrate and glucose diet. -Will continue to monitor.

## 2020-04-20 NOTE — Assessment & Plan Note (Addendum)
-  Followed by Endocrinology, Dr. Chalmers Cater. -On Levothyroxine 88 mcg. -Patient declined obtaining labs today and will contact Dr. Chalmers Cater if symptoms worsen.

## 2020-04-20 NOTE — Patient Instructions (Signed)

## 2020-05-02 DIAGNOSIS — E890 Postprocedural hypothyroidism: Secondary | ICD-10-CM | POA: Diagnosis not present

## 2020-06-12 ENCOUNTER — Other Ambulatory Visit: Payer: Self-pay | Admitting: Physician Assistant

## 2020-06-12 ENCOUNTER — Other Ambulatory Visit: Payer: Self-pay | Admitting: Internal Medicine

## 2020-06-13 NOTE — Telephone Encounter (Signed)
Rx request sent to pharmacy.  

## 2020-07-22 DIAGNOSIS — H401133 Primary open-angle glaucoma, bilateral, severe stage: Secondary | ICD-10-CM | POA: Diagnosis not present

## 2020-08-02 ENCOUNTER — Telehealth: Payer: Self-pay | Admitting: Physician Assistant

## 2020-08-02 NOTE — Telephone Encounter (Signed)
Patient had a head cold for about a month and cough that is much better, patient still has a cough. Patient also has ringing in ears. Patient has taken one neg COVID test around the 22 ND of April. Please advise, thanks.

## 2020-08-02 NOTE — Telephone Encounter (Signed)
Spoke with patient about symptoms and requested she take another COVID test at home. She reports only having a deep cough and ringing in ears. Patient is going to take a COVID test and call back tomorrow to give result. If the result is negative please schedule the patient for soonest available appointment to address ringing in her ears. Nolon Bussing, [place]

## 2020-08-03 NOTE — Telephone Encounter (Signed)
Patient scheduled for June and was advised there is Urgent Care if she cannot wait. Patient also mentioned shingles. COVID test was negative.

## 2020-08-05 DIAGNOSIS — E890 Postprocedural hypothyroidism: Secondary | ICD-10-CM | POA: Diagnosis not present

## 2020-08-15 DIAGNOSIS — E890 Postprocedural hypothyroidism: Secondary | ICD-10-CM | POA: Diagnosis not present

## 2020-08-16 ENCOUNTER — Encounter: Payer: Self-pay | Admitting: Nurse Practitioner

## 2020-08-16 ENCOUNTER — Ambulatory Visit (INDEPENDENT_AMBULATORY_CARE_PROVIDER_SITE_OTHER): Payer: Medicare HMO | Admitting: Nurse Practitioner

## 2020-08-16 ENCOUNTER — Other Ambulatory Visit: Payer: Self-pay

## 2020-08-16 VITALS — BP 106/67 | HR 65 | Temp 96.8°F | Ht 62.0 in | Wt 121.6 lb

## 2020-08-16 DIAGNOSIS — H6690 Otitis media, unspecified, unspecified ear: Secondary | ICD-10-CM | POA: Insufficient documentation

## 2020-08-16 DIAGNOSIS — H9313 Tinnitus, bilateral: Secondary | ICD-10-CM | POA: Insufficient documentation

## 2020-08-16 MED ORDER — CIPROFLOXACIN-DEXAMETHASONE 0.3-0.1 % OT SUSP: 4.0000 [drp] | Freq: Two times a day (BID) | OTIC | 0 refills | Status: DC

## 2020-08-16 NOTE — Progress Notes (Signed)
Established Patient Office Visit  Subjective:  Patient ID: [name], female    DOB: [birth year]  Age: 75 y.o. MRN: [id number]  CC:  Chief Complaint  Patient presents with  . Tinnitus    HPI JAANVI FIZER presents for evaluation of tinnitus. States that this started back in April when she had a very significant cold. States that this was most significant cold she had had in quite some time. States that she took home test for COVID 19 and it was negative. She states about a week into this cold, she started hearing what sounded like hundreds of cicadas singing. Sound was both remote and up close. She later figured out that she was only one hearing this sound. She states that she does have ENT who did sinus surgery on her several years ago. Was told she may need to see audiologist, however, she would have to see primary care provider first to get referral. She states that the tinnitus waxes and wanes. States that when she is concentrating on other things she does not hear it as much. States that it is always present. The patient states that the cold itself has resolved. She denies headache or fever. She states that she has had a few episodes of mild nausea without vomiting. Her blood pressure has been doing well.   Past Medical History:  Diagnosis Date  . Anxiety   . Barrett esophagus 2003  . Diverticulitis   . Diverticulosis   . GERD (gastroesophageal reflux disease)   . Glaucoma   . Grave's disease   . H. pylori infection   . Hepatitis A    in her 8 from food  . Hyperlipidemia   . Hypothyroidism   . Increased pressure in the eye   . Insomnia   . Morton's neuroma   . Murmur   . Scoliosis   . Stroke [facility]) 12/2014   no deficits    Past Surgical History:  Procedure Laterality Date  . AORTIC ARCH ANGIOGRAPHY N/A 01/01/2017   Procedure: AORTIC ARCH ANGIOGRAPHY;  Surgeon: Serafina Mitchell, MD;  Location: [facility];  Service: Cardiovascular;  Laterality: N/A;  .  APPENDECTOMY  1958  . CATARACT EXTRACTION Left   . CATARACT EXTRACTION  02/12/2018  . CATARACT EXTRACTION W/PHACO Left 07/02/2016   Procedure: CATARACT EXTRACTION PHACO AND INTRAOCULAR LENS PLACEMENT ([place])  Left;  Surgeon: Ronnell Freshwater, MD;  Location: [place];  Service: Ophthalmology;  Laterality: Left;  . CATARACT EXTRACTION W/PHACO Right 02/12/2018   Procedure: CATARACT EXTRACTION PHACO AND INTRAOCULAR LENS PLACEMENT ([place])  RIGHT;  Surgeon: Leandrew Koyanagi, MD;  Location: [place];  Service: Ophthalmology;  Laterality: Right;  . CERVICAL LAMINECTOMY  1995  . COLON SURGERY    . EYE SURGERY     due to graves disease  . FEMUR SURGERY Right    benign bone growth  . FOOT NEUROMA SURGERY Right   . INTRAVASCULAR PRESSURE WIRE/FFR STUDY N/A 04/19/2017   Procedure: INTRAVASCULAR PRESSURE WIRE/FFR STUDY;  Surgeon: Nelva Bush, MD;  Location: [place] [facility];  Service: Cardiovascular;  Laterality: N/A;  . LAPAROSCOPIC ASSISTED VAGINAL HYSTERECTOMY    . LAPAROSCOPIC SIGMOID COLECTOMY  05/13/06   Dr Zella Richer  . LEFT HEART CATH AND CORONARY ANGIOGRAPHY N/A 04/19/2017   Procedure: LEFT HEART CATH AND CORONARY ANGIOGRAPHY;  Surgeon: Nelva Bush, MD;  Location: [place];  Service: Cardiovascular;  Laterality: N/A;  . LUMBAR [place] SURGERY  2009  .  PERIPHERAL VASCULAR INTERVENTION Left 01/01/2017   Procedure: PERIPHERAL VASCULAR INTERVENTION;  Surgeon: Serafina Mitchell, MD;  Location: [place] [facility];  Service: Cardiovascular;  Laterality: Left;  . SUBCLAVIAN STENT PLACEMENT    . TONSILLECTOMY  1992  . TRIGGER FINGER RELEASE Right 05/26/2018   Procedure: RIGHT INDEX RELEASE TRIGGER FINGER/A-1 PULLEY;  Surgeon: [name], MD;  Location: [facility];  Service: Orthopedics;  Laterality: Right;  . TUBAL LIGATION    . UPPER EXTREMITY ANGIOGRAPHY Left 01/01/2017   Procedure: Upper Extremity Angiography;  Surgeon: Serafina Mitchell, MD;   Location: [place] [facility];  Service: Cardiovascular;  Laterality: Left;    Family History  Problem Relation Age of Onset  . Other Father 42       SEPSIS  . Hypertension Father   . Stroke Father 57       CVA  . Heart failure Mother 59  . Heart murmur Mother   . Heart attack Sister 61  . Diabetes Brother   . Breast cancer Sister 46  . Stroke Sister   . Cirrhosis Brother   . Hypertension Sister        x 2  . Diabetes Sister   . Heart murmur Daughter   . Breast cancer Maternal Aunt   . Breast cancer Paternal Aunt   . Breast cancer Cousin   . Breast cancer Cousin   . Breast cancer Cousin   . Breast cancer Cousin     Social History   Socioeconomic History  . Marital status: Married    Spouse name: Not on file  . Number of children: 1  . Years of education: Not on file  . Highest education level: Not on file  Occupational History  . Occupation: Merchant navy officer    Comment: @ [employer] on [employer]  Tobacco Use  . Smoking status: Former Smoker    Packs/day: 0.25    Types: Cigarettes    Quit date: 03/12/1978    Years since quitting: 42.4  . Smokeless tobacco: Never Used  . Tobacco comment: quit in 1978  Vaping Use  . Vaping Use: Never used  Substance and Sexual Activity  . Alcohol use: Yes    Alcohol/week: 7.0 standard drinks    Types: 7 Glasses of wine per week  . Drug use: No  . Sexual activity: Not Currently    Birth control/protection: None  Other Topics Concern  . Not on file  Social History Narrative  . Not on file   Social Determinants of Health   Financial Resource Strain: Not on file  Food Insecurity: Not on file  Transportation Needs: Not on file  Physical Activity: Not on file  Stress: Not on file  Social Connections: Not on file  Intimate Partner Violence: Not on file    Outpatient Medications Prior to Visit  Medication Sig Dispense Refill  . acetaminophen (TYLENOL) 500 MG tablet Take 500 mg by mouth daily as needed for moderate  pain or headache.     . Calcium Citrate-Vitamin D (CALCIUM CITRATE + PO) Take by mouth daily at 12 noon.    . clopidogrel (PLAVIX) 75 MG tablet TAKE 1 TABLET BY MOUTH EVERY DAY 90 tablet 0  . diazepam (VALIUM) 5 MG tablet Take 1 tablet (5 mg total) by mouth at bedtime as needed for anxiety (sleep). 30 tablet 0  . dorzolamide-timolol (COSOPT) 22.3-6.8 MG/ML ophthalmic solution Place into both eyes daily.    . isosorbide mononitrate (IMDUR) 30  MG 24 hr tablet TAKE 0.5 TABLETS (15 MG TOTAL) BY MOUTH DAILY. 45 tablet 1  . lansoprazole (PREVACID) 30 MG capsule Take 30 mg by mouth. One capsule 3 times weekly    . latanoprost (XALATAN) 0.005 % ophthalmic solution Place 1 drop into both eyes at bedtime.    Marland Kitchen levothyroxine (SYNTHROID) 88 MCG tablet Take 88 mcg by mouth daily.    . Multiple Vitamin (MULTIVITAMIN) tablet Take 1 tablet by mouth daily.    . rosuvastatin (CRESTOR) 40 MG tablet Take 1 tablet (40 mg total) by mouth daily. 90 tablet 2  . Lutein 20 MG TABS Take 1 tablet by mouth daily.    . Multiple Vitamins-Minerals (PRESERVISION AREDS 2 PO) Take by mouth daily at 8 pm.     No facility-administered medications prior to visit.    Allergies  Allergen Reactions  . Neuromuscular Blocking Agents Other (See Comments)    Multiple symptoms, SOB, fatigue, weakness  . Vicodin [Hydrocodone-Acetaminophen] Other (See Comments)    Altered mental state  . Morphine And Related Rash    ROS Review of Systems  Constitutional: Negative for activity change, chills, fatigue and fever.  HENT: Positive for tinnitus. Negative for congestion, postnasal drip, rhinorrhea, sinus pressure and sinus pain.   Eyes: Negative.   Respiratory: Negative for cough, chest tightness, shortness of breath and wheezing.   Cardiovascular: Negative for chest pain and palpitations.  Gastrointestinal: Positive for nausea. Negative for constipation, diarrhea and vomiting.  Endocrine: Negative for cold intolerance, heat  intolerance, polydipsia and polyuria.  Musculoskeletal: Negative for back pain and myalgias.  Skin: Negative for rash.  Allergic/Immunologic: Negative.   Neurological: Negative for dizziness, weakness and headaches.  Psychiatric/Behavioral: Negative.  The patient is not nervous/anxious.   All other systems reviewed and are negative.     Objective:    Physical Exam Vitals and nursing note reviewed.  Constitutional:      Appearance: Normal appearance. She is well-developed.  HENT:     Head: Normocephalic and atraumatic.     Right Ear: No tenderness. Tympanic membrane is erythematous and bulging.     Left Ear: No tenderness. Tympanic membrane is erythematous and bulging.     Nose: Nose normal.  Eyes:     Pupils: Pupils are equal, round, and reactive to light.  Cardiovascular:     Rate and Rhythm: Normal rate and regular rhythm.     Pulses: Normal pulses.     Heart sounds: Normal heart sounds.  Pulmonary:     Effort: Pulmonary effort is normal.     Breath sounds: Normal breath sounds.  Abdominal:     Palpations: Abdomen is soft.  Musculoskeletal:        General: Normal range of motion.     Cervical back: Normal range of motion and neck supple.  Skin:    General: Skin is warm and dry.     Capillary Refill: Capillary refill takes less than 2 seconds.  Neurological:     General: No focal deficit present.     Mental Status: She is alert and oriented to person, place, and time.  Psychiatric:        Mood and Affect: Mood normal.        Behavior: Behavior normal.        Thought Content: Thought content normal.        Judgment: Judgment normal.     Today's Vitals   08/16/20 1055  BP: 106/67  Pulse: 65  Temp: (!) 96.8 F (  36 C)  SpO2: 96%  Weight: 121 lb 9.6 oz (55.2 kg)  Height: 5\' 2"  (1.575 m)   Body mass index is 22.24 kg/m.   Wt Readings from Last 3 Encounters:  08/16/20 121 lb 9.6 oz (55.2 kg)  04/20/20 123 lb 6.4 oz (56 kg)  04/04/20 124 lb 1.6 oz (56.3 kg)      Health Maintenance Due  Topic Date Due  . Hepatitis C Screening  Never done  . TETANUS/TDAP  Never done  . Zoster Vaccines- Shingrix (1 of 2) Never done  . Pneumococcal Vaccine 61-25 Years old (1 of 2 - PPSV23) Never done  . COVID-19 Vaccine (3 - Booster for Pfizer series) 09/26/2019    There are no preventive care reminders to display for this patient.  Lab Results  Component Value Date   TSH 0.693 10/21/2019   Lab Results  Component Value Date   WBC 4.2 10/21/2019   HGB 14.6 10/21/2019   HCT 45.8 10/21/2019   MCV 93 10/21/2019   PLT 231 10/21/2019   Lab Results  Component Value Date   NA 136 10/21/2019   K 5.1 10/21/2019   CO2 25 10/21/2019   GLUCOSE 101 (H) 10/21/2019   BUN 14 10/21/2019   CREATININE 0.80 12/17/2019   BILITOT 0.6 10/21/2019   ALKPHOS 86 10/21/2019   AST 27 10/21/2019   ALT 22 10/21/2019   PROT 7.2 10/21/2019   ALBUMIN 4.7 10/21/2019   CALCIUM 9.7 10/21/2019   ANIONGAP 8 09/23/2017   Lab Results  Component Value Date   CHOL 146 10/21/2019   Lab Results  Component Value Date   HDL 60 10/21/2019   Lab Results  Component Value Date   LDLCALC 73 10/21/2019   Lab Results  Component Value Date   TRIG 65 10/21/2019   Lab Results  Component Value Date   CHOLHDL 2.4 10/21/2019   Lab Results  Component Value Date   HGBA1C 5.9 (H) 10/21/2019      Assessment & Plan:  1. Acute otitis media, unspecified otitis media type Start ciprodex ear drops. She should insert four drops in both ears twice daily for next 7 days. After inserting the drops she should place cotton ball in outer ear canal for 10 minutes then remove. Encouraged her to keep the ear canals dry and free of excess moisture.  - ciprofloxacin-dexamethasone (CIPRODEX) OTIC suspension; Place 4 drops into both ears 2 (two) times daily.  Dispense: 7.5 mL; Refill: 0  2. Tinnitus of both ears Will treat with ciprodex ear drops for 7 days for potential otitis. Patient will notify  the office if symptoms not better or worse after next week. Will refer to audiologist at that time. Patient voices understanding and agreement with this plan.   Problem List Items Addressed This Visit      Nervous and Auditory   Acute otitis media - Primary   Relevant Medications   ciprofloxacin-dexamethasone (CIPRODEX) OTIC suspension     Other   Tinnitus of both ears      Meds ordered this encounter  Medications  . ciprofloxacin-dexamethasone (CIPRODEX) OTIC suspension    Sig: Place 4 drops into both ears 2 (two) times daily.    Dispense:  7.5 mL    Refill:  0    Order Specific Question:   Supervising Provider    Answer:   Beatrice Lecher D [[id number]]    Follow-up: Return for prn for worsening or persistent symptoms. Marland Kitchen    Ronnell Freshwater,  NP

## 2020-08-16 NOTE — Patient Instructions (Signed)

## 2020-08-22 ENCOUNTER — Telehealth: Payer: Medicare HMO | Admitting: Family

## 2020-08-22 ENCOUNTER — Telehealth: Payer: Medicare HMO | Admitting: Nurse Practitioner

## 2020-08-22 ENCOUNTER — Encounter: Payer: Self-pay | Admitting: Nurse Practitioner

## 2020-08-22 ENCOUNTER — Encounter: Payer: Medicare HMO | Admitting: Physician Assistant

## 2020-08-22 ENCOUNTER — Telehealth: Payer: Self-pay | Admitting: Physician Assistant

## 2020-08-22 DIAGNOSIS — U071 COVID-19: Secondary | ICD-10-CM | POA: Diagnosis not present

## 2020-08-22 MED ORDER — MOLNUPIRAVIR EUA 200MG CAPSULE: 4.0000 | ORAL_CAPSULE | Freq: Two times a day (BID) | ORAL | 0 refills | Status: AC

## 2020-08-22 MED ORDER — PREDNISONE 20 MG PO TABS: 40.0000 mg | ORAL_TABLET | Freq: Every day | ORAL | 0 refills | Status: AC

## 2020-08-22 MED ORDER — BENZONATATE 100 MG PO CAPS: 100.0000 mg | ORAL_CAPSULE | Freq: Three times a day (TID) | ORAL | 0 refills | Status: DC | PRN

## 2020-08-22 NOTE — Progress Notes (Signed)
[name], spark are scheduled for a virtual visit with [name]< FNP today.    Just as we do with appointments in the office, we must obtain your consent to participate.  Your consent will be active for this visit and any virtual visit you may have with one of our providers in the next 365 days.    If you have a MyChart account, I can also send a copy of this consent to you electronically.  All virtual visits are billed to your insurance company just like a traditional visit in the office.  As this is a virtual visit, video technology does not allow for your provider to perform a traditional examination.  This may limit your provider's ability to fully assess your condition.  If your provider identifies any concerns that need to be evaluated in person or the need to arrange testing such as labs, EKG, etc, we will make arrangements to do so.    Although advances in technology are sophisticated, we cannot ensure that it will always work on either your end or our end.  If the connection with a video visit is poor, we may have to switch to a telephone visit.  With either a video or telephone visit, we are not always able to ensure that we have a secure connection.   I need to obtain your verbal consent now.   Are you willing to proceed with your visit today?   [name] has provided verbal consent on 08/22/2020 for a virtual visit (video or telephone).  Virtual Visit via Video  I connected with  [name]  on 08/22/20 at 12:05 by video and verified that I am speaking with the correct person using two identifiers. [name] is currently located at home and none is currently with her during visit. The provider, Jamie Hassell Done, FNP is located at home at time of visit.  I discussed the limitations, risks, security and privacy concerns of performing an evaluation and management service by video  and the availability of in person appointments. I also discussed with the patient that  there may be a patient responsible charge related to this service. The patient expressed understanding and agreed to proceed.   Subjective:   HPI:   Patient doing video visit stating that she tested positive for covid. She is c/o cough, congestion and body aches. Started about 4 days ago. Tested positive for covid this morning. Due to comorbid conditions needs antiviral.  Review of Systems  Constitutional:  Negative for chills and fever.  HENT:  Positive for congestion.   Respiratory:  Positive for cough and sputum production.   Musculoskeletal:  Positive for myalgias.  All other systems reviewed and are negative.   See pertinent positives and negatives per HPI.  Patient Active Problem List   Diagnosis Date Noted   Acute otitis media 08/16/2020   Tinnitus of both ears 08/16/2020   Prediabetes 10/28/2018   Epigastric pain 03/03/2018   Trigger finger, right index finger 01/01/2018   Coronary artery disease of native artery of native heart with stable angina pectoris ([place]) 05/23/2017   PAD (peripheral artery disease) ([place]) 05/23/2017   Vaginal dryness 02/25/2017   Depression, recurrent ([place]) 02/25/2017   Stable angina ([place]) 01/08/2017   History of stroke 01/08/2017   Accelerating angina ([place]) 11/17/2016   Subclavian artery stenosis, left ([place]) 11/17/2016   Palpitations 11/17/2016   Hyperlipidemia LDL goal <70 11/17/2016   Unequal blood pressure in upper extremities 10/22/2016  Hypotension 08/07/2016   Health care maintenance 05/29/2016   Cerebral infarction due to thrombosis of right vertebral artery (HCC)    Cerebral infarction ([place]) 12/26/2014   Anxiety 12/26/2014   Hypothyroidism 12/26/2014   GERD (gastroesophageal reflux disease) 12/26/2014   Glaucoma 12/26/2014   Allergic rhinitis 12/26/2014   Cerebrovascular accident (CVA) ([place]) 12/26/2014   Cerebral infarction due to embolism of right vertebral artery (HCC)    Visual field loss    Occlusion and stenosis of  vertebral artery    Insomnia 03/23/2013   Morton's neuroma 11/02/2011   Chronic sinusitis 10/09/2011    Social History   Tobacco Use   Smoking status: Former    Packs/day: 0.25    Pack years: 0.00    Types: Cigarettes    Quit date: 03/12/1978    Years since quitting: 42.4   Smokeless tobacco: Never   Tobacco comments:    quit in 1978  Substance Use Topics   Alcohol use: Yes    Alcohol/week: 7.0 standard drinks    Types: 7 Glasses of wine per week    Current Outpatient Medications:    acetaminophen (TYLENOL) 500 MG tablet, Take 500 mg by mouth daily as needed for moderate pain or headache. , Disp: , Rfl:    Calcium Citrate-Vitamin D (CALCIUM CITRATE + PO), Take by mouth daily at 12 noon., Disp: , Rfl:    ciprofloxacin-dexamethasone (CIPRODEX) OTIC suspension, Place 4 drops into both ears 2 (two) times daily., Disp: 7.5 mL, Rfl: 0   clopidogrel (PLAVIX) 75 MG tablet, TAKE 1 TABLET BY MOUTH EVERY DAY, Disp: 90 tablet, Rfl: 0   diazepam (VALIUM) 5 MG tablet, Take 1 tablet (5 mg total) by mouth at bedtime as needed for anxiety (sleep)., Disp: 30 tablet, Rfl: 0   dorzolamide-timolol (COSOPT) 22.3-6.8 MG/ML ophthalmic solution, Place into both eyes daily., Disp: , Rfl:    isosorbide mononitrate (IMDUR) 30 MG 24 hr tablet, TAKE 0.5 TABLETS (15 MG TOTAL) BY MOUTH DAILY., Disp: 45 tablet, Rfl: 1   lansoprazole (PREVACID) 30 MG capsule, Take 30 mg by mouth. One capsule 3 times weekly, Disp: , Rfl:    latanoprost (XALATAN) 0.005 % ophthalmic solution, Place 1 drop into both eyes at bedtime., Disp: , Rfl:    levothyroxine (SYNTHROID) 88 MCG tablet, Take 88 mcg by mouth daily., Disp: , Rfl:    Multiple Vitamin (MULTIVITAMIN) tablet, Take 1 tablet by mouth daily., Disp: , Rfl:    rosuvastatin (CRESTOR) 40 MG tablet, Take 1 tablet (40 mg total) by mouth daily., Disp: 90 tablet, Rfl: 2  Allergies  Allergen Reactions   Neuromuscular Blocking Agents Other (See Comments)    Multiple symptoms, SOB,  fatigue, weakness   Vicodin [Hydrocodone-Acetaminophen] Other (See Comments)    Altered mental state   Morphine And Related Rash    Objective:   There were no vitals taken for this visit.  Patient is well-developed, well-nourished in no acute distress.  Resting comfortably  at home.  Head is normocephalic, atraumatic.  No labored breathing.  Speech is clear and coherent with logical content.  Patient is alert and oriented at baseline.  Voice hoarse Dry cough noted  Assessment and Plan:        [name] in today with chief complaint of No chief complaint on file.   1. Lab test positive for detection of COVID-19 virus Meds ordered this encounter  Medications   benzonatate (TESSALON PERLES) 100 MG capsule    Sig: Take 1 capsule (100  mg total) by mouth 3 (three) times daily as needed.    Dispense:  20 capsule    Refill:  0    Order Specific Question:   Supervising Provider    Answer:   MILLER, BRIAN [[id number]]   predniSONE (DELTASONE) 20 MG tablet    Sig: Take 2 tablets (40 mg total) by mouth daily with breakfast for 5 days. 2 po daily for 5 days    Dispense:  10 tablet    Refill:  0    Order Specific Question:   Supervising Provider    Answer:   [name] [[id number]]   Force fluids humidifier Patient declines Molnupiravir. - she is aware that there is no interaction with plavix and she would not have to hold plavix while on meds. S eis also aware that if sh edoes not start meds today then she will be out of treatment window tomorrow.    The above assessment and management plan was discussed with the patient. The patient verbalized understanding of and has agreed to the management plan. Patient is aware to call the clinic if symptoms persist or worsen. Patient is aware when to return to the clinic for a follow-up visit. Patient educated on when it is appropriate to go to the emergency department.   [place], FNP  .   [place],  FNP 08/22/2020  Time spent with the patient: 15 minutes, of which >50% was spent in obtaining information about symptoms, reviewing previous labs, evaluations, and treatments, counseling about condition (please see the discussed topics above), and developing a plan to further investigate it; had a number of questions which I addressed.

## 2020-08-22 NOTE — Progress Notes (Signed)
E-Visit for Positive Covid Test Result We are sorry you are not feeling well. We are here to help!  You have tested positive for COVID-19, meaning that you were infected with the novel coronavirus and could give the virus to others.  It is vitally important that you stay home so you do not spread it to others.      Please continue isolation at home, for at least 10 days since the start of your symptoms and until you have had 24 hours with no fever (without taking a fever reducer) and with improving of symptoms.  If you have no symptoms but tested positive (or all symptoms resolve after 5 days and you have no fever) you can leave your house but continue to wear a mask around others for an additional 5 days. If you have a fever,continue to stay home until you have had 24 hours of no fever. Most cases improve 5-10 days from onset but we have seen a small number of patients who have gotten worse after the 10 days.  Please be sure to watch for worsening symptoms and remain taking the proper precautions.   Go to the nearest hospital ED for assessment if fever/cough/breathlessness are severe or illness seems like a threat to life.    The following symptoms may appear 2-14 days after exposure: Fever Cough Shortness of breath or difficulty breathing Chills Repeated shaking with chills Muscle pain Headache Sore throat New loss of taste or smell Fatigue Congestion or runny nose Nausea or vomiting Diarrhea  You have been enrolled in [facility] for COVID-19. Daily you will receive a questionnaire within the [facility] website. Our COVID-19 response team will be monitoring your responses daily.  You can use medication such as   You may also take acetaminophen (Tylenol) as needed for fever.  I have sent in Molnupiravir 800 mg twice a day for 5 days. As discussed with you in your previous video visit, you need to start this today.   HOME CARE: Only take medications as instructed by your  medical team. Drink plenty of fluids and get plenty of rest. A steam or ultrasonic humidifier can help if you have congestion.   GET HELP RIGHT AWAY IF YOU HAVE EMERGENCY WARNING SIGNS.  Call 911 or proceed to your closest emergency facility if: You develop worsening high fever. Trouble breathing Bluish lips or face Persistent pain or pressure in the chest New confusion Inability to wake or stay awake You cough up blood. Your symptoms become more severe Inability to hold down food or fluids  This list is not all possible symptoms. Contact your medical provider for any symptoms that are severe or concerning to you.    Your e-visit answers were reviewed by a board certified advanced clinical practitioner to complete your personal care plan.  Depending on the condition, your plan could have included both over the counter or prescription medications.  If there is a problem please reply once you have received a response from your provider.  Your safety is important to [place].  If you have drug allergies check your prescription carefully.    You can use MyChart to ask questions about today's visit, request a non-urgent call back, or ask for a work or school excuse for 24 hours related to this e-Visit. If it has been greater than 24 hours you will need to follow up with your provider, or enter a new e-Visit to address those concerns. You will get an e-mail in the  next two days asking about your experience.  I hope that your e-visit has been valuable and will speed your recovery. Thank you for using e-visits.    Approximately 5 minutes was spent documenting and reviewing patient's chart.

## 2020-08-22 NOTE — Progress Notes (Signed)
Provider notified patient x 4 and waited over 25 minutes with no connection.  Patient has had a video visit already today, so will be marked erroneous

## 2020-08-22 NOTE — Progress Notes (Signed)
  E-Visit for [employer] Virus Screening   Reviewing your chart you have several co morbid conditions that can cause the covid virus to have moe of an effect on you. You qualify for paxlovid or Molnupiravir which cannot be prescribed in and evisit. You will need to go to your my chart and request a video visit to receive one of these medications or contact your Primary Care provider.

## 2020-08-22 NOTE — Telephone Encounter (Signed)
Patient is covid positive and is requesting apt to discuss treatment. Patient denies SOB, Difficulty breathing or high fever.   Patient given instructions to schedule e-visit thru mychart for covid positive patients. Patient was agreeable to this and was instructed to call office back if she had difficulty scheduling or if her symptoms worsened. AS, CMA

## 2020-09-01 ENCOUNTER — Ambulatory Visit: Payer: Medicare HMO | Admitting: Internal Medicine

## 2020-09-01 NOTE — Progress Notes (Deleted)
Follow-up Outpatient Visit Date: 09/01/2020  Primary Care Provider: Lorrene Reid, PA-C [place] [address]  Chief Complaint: ***  HPI:  [name] is a 75 y.o. female with history of nonobstructive coronary artery disease, peripheral vascular disease with right vertebral artery occlusion and left subclavian artery stenosis status post stenting (12/2016-followed by Dr. Trula Slade), stroke (2016), hyperlipidemia, hypothyroidism, and GERD, who presents for follow-up of coronary artery disease.  I last saw her in December, at which time she was feeling relatively well though she continued to have occasional chest discomfort when walking uphill.  This was had improved from prior visits.  Due to suboptimal lipid control, we agreed to escalate rosuvastatin to 40 mg daily.  No other medication changes or additional testing were pursued.  COVID 10 days ago.  --------------------------------------------------------------------------------------------------  Cardiovascular History & Procedures: Cardiovascular Problems: Peripheral vascular disease Stroke Stable angina with nonobstructive coronary artery disease   Risk Factors: Peripheral and cerebrovascular disease, hyperlipidemia, and age greater than 38   Cath/PCI: LHC (04/19/17): LMCA normal. LAD with sequential 50% and 25% lesions (FFR 0.92). Ramus with minimal disease. LCx normal. RCA with mild diffuse disease. LVEDP 17 mmHg. LVEF 55-65%.   CV Surgery: Aortography and left subclavian angiography; intervention (01/01/17, Dr. Trula Slade): Occluded right vertebral artery. 80% left subclavian artery stenosis with successful intervention with Xpress 8 x 25 mm balloon-expandable stent.   EP Procedures and Devices: 30-day event monitor (11/28/16): Sinus rhythm. No significant arrhythmia.   Non-Invasive Evaluation(s): Carotid Doppler (04/04/2020): 1-39% stenosis in both internal carotid arteries.  Abselen right vertebral  artery flow.  Normal bilateral subclavian artery flow hemodynamics. Cardiac CTA (12/17/2019): LMCA normal.  LAD with calcified plaque and 25-49% stenosis involving the proximal and mid segments.  Ramus intermedius with less than 25% calcified stenosis of the mid vessel.  LCx with less than 25% stenosis involving the mid vessel.  Normal RCA.  Coronary calcium score 302 (79th percentile).  Incidental note made of clustered area of tree-in-bud nodularity in the superior segment of the left lower lobe.  Findings most likely reflect inflammatory/infectious bronchiolitis. Carotid Doppler (02/17/18): 1-39% stenosis of both internal carotid arteries.  Right vertebral artery is occluded.  Left vertebral artery shows antegrade flow.  No significant subclavian artery stenosis bilaterally. Exercise MPI (11/22/16): Low risk study with small in size, mild in severity, fixed apical anterior and apical defect likely representing apical thinning. LVEF >65% with normal wall motion. Patient complained of fatigue during test, exercising 5:30 minutes. Upper extremity arterial Doppler (10/26/16): Right upper extremity evaluation normal. Left upper extremity demonstrates dampened biphasic to monophasic waveforms and 20 mmHg pressure difference compared to the right. Carotid artery duplex (10/18/16): Bilateral 1-39% stenoses involving the right and left internal carotid arteries. Transthoracic echocardiogram (12/28/14): Normal LV size and function. LVEF 60-65% with grade 1 diastolic dysfunction. Normal RV size and function. No significant vascular abnormalities.  Recent CV Pertinent Labs: Lab Results  Component Value Date   CHOL 146 10/21/2019   HDL 60 10/21/2019   LDLCALC 73 10/21/2019   TRIG 65 10/21/2019   CHOLHDL 2.4 10/21/2019   CHOLHDL 3.9 12/27/2014   INR 1.0 04/12/2017   K 5.1 10/21/2019   BUN 14 10/21/2019   CREATININE 0.80 12/17/2019    Past medical and surgical history were reviewed and updated in EPIC.  No  outpatient medications have been marked as taking for the 09/01/20 encounter (Appointment) with Jasiyah Paulding, Harrell Gave, MD.    Allergies: Neuromuscular blocking agents, Vicodin [hydrocodone-acetaminophen],  and Morphine and related  Social History   Tobacco Use   Smoking status: Former    Packs/day: 0.25    Pack years: 0.00    Types: Cigarettes    Quit date: 03/12/1978    Years since quitting: 42.5   Smokeless tobacco: Never   Tobacco comments:    quit in 1978  Vaping Use   Vaping Use: Never used  Substance Use Topics   Alcohol use: Yes    Alcohol/week: 7.0 standard drinks    Types: 7 Glasses of wine per week   Drug use: No    Family History  Problem Relation Age of Onset   Other Father 64       SEPSIS   Hypertension Father    Stroke Father 51       CVA   Heart failure Mother 70   Heart murmur Mother    Heart attack Sister 21   Diabetes Brother    Breast cancer Sister 3   Stroke Sister    Cirrhosis Brother    Hypertension Sister        x 2   Diabetes Sister    Heart murmur Daughter    Breast cancer Maternal Aunt    Breast cancer Paternal Aunt    Breast cancer Cousin    Breast cancer Cousin    Breast cancer Cousin    Breast cancer Cousin     Review of Systems: A 12-system review of systems was performed and was negative except as noted in the HPI.  --------------------------------------------------------------------------------------------------  Physical Exam: There were no vitals taken for this visit.  General:  NAD. Neck: No JVD or HJR. Lungs: Clear to auscultation bilaterally without wheezes or crackles. Heart: Regular rate and rhythm without murmurs, rubs, or gallops. Abdomen: Soft, nontender, nondistended. Extremities: No lower extremity edema.  EKG:  ***  Lab Results  Component Value Date   WBC 4.2 10/21/2019   HGB 14.6 10/21/2019   HCT 45.8 10/21/2019   MCV 93 10/21/2019   PLT 231 10/21/2019    Lab Results  Component Value Date   NA 136  10/21/2019   K 5.1 10/21/2019   CL 100 10/21/2019   CO2 25 10/21/2019   BUN 14 10/21/2019   CREATININE 0.80 12/17/2019   GLUCOSE 101 (H) 10/21/2019   ALT 22 10/21/2019    Lab Results  Component Value Date   CHOL 146 10/21/2019   HDL 60 10/21/2019   LDLCALC 73 10/21/2019   TRIG 65 10/21/2019   CHOLHDL 2.4 10/21/2019    --------------------------------------------------------------------------------------------------  ASSESSMENT AND PLAN:   Nelva Bush, MD 09/01/2020 6:44 AM

## 2020-09-10 ENCOUNTER — Other Ambulatory Visit: Payer: Self-pay | Admitting: Physician Assistant

## 2020-09-19 ENCOUNTER — Other Ambulatory Visit: Payer: Self-pay | Admitting: Physician Assistant

## 2020-09-27 ENCOUNTER — Ambulatory Visit (INDEPENDENT_AMBULATORY_CARE_PROVIDER_SITE_OTHER): Payer: Medicare HMO | Admitting: Nurse Practitioner

## 2020-09-27 ENCOUNTER — Encounter: Payer: Self-pay | Admitting: Nurse Practitioner

## 2020-09-27 ENCOUNTER — Other Ambulatory Visit: Payer: Self-pay

## 2020-09-27 ENCOUNTER — Ambulatory Visit
Admission: RE | Admit: 2020-09-27 | Discharge: 2020-09-27 | Disposition: A | Discharge status: Home / Self Care | Payer: Medicare HMO | Source: Ambulatory Visit | Attending: Nurse Practitioner | Admitting: Nurse Practitioner

## 2020-09-27 VITALS — BP 117/75 | HR 80 | Temp 97.6°F | Ht 62.0 in | Wt 118.3 lb

## 2020-09-27 DIAGNOSIS — R059 Cough, unspecified: Secondary | ICD-10-CM

## 2020-09-27 DIAGNOSIS — Z8616 Personal history of COVID-19: Secondary | ICD-10-CM

## 2020-09-27 DIAGNOSIS — J0140 Acute pansinusitis, unspecified: Secondary | ICD-10-CM | POA: Diagnosis not present

## 2020-09-27 MED ORDER — ALBUTEROL SULFATE HFA 108 (90 BASE) MCG/ACT IN AERS: 2.0000 | INHALATION_SPRAY | Freq: Four times a day (QID) | RESPIRATORY_TRACT | 2 refills | Status: DC | PRN

## 2020-09-27 MED ORDER — AMOXICILLIN 875 MG PO TABS: 875.0000 mg | ORAL_TABLET | Freq: Two times a day (BID) | ORAL | 0 refills | Status: DC

## 2020-09-27 NOTE — Progress Notes (Signed)
Established Patient Office Visit  Subjective:  Patient ID: [name], female    DOB: 11-Mar-1946  Age: 75 y.o. MRN: [id number]  CC:  Chief Complaint  Patient presents with   Cough    HPI [name] presents for evaluation of cough.  States that she was diagnosed with COVID-19 on 08/22/2020.  States she had cough, chest congestion, and significant fatigue.  Denies ever having fever.  States that it kept her inactive for 2 to 3 weeks.  States she is recovered from active [place].  States that she is still having productive cough, chest congestion, and some wheezing.  She states that cough is productive of green/yellow sputum.  She denies fever, chills, body aches or muscle pain.  Denies headache.  Denies nausea, vomiting, or diarrhea.  States she does see cardiology, but does not believe the symptoms are related to her heart.  She does have appointment with cardiologist on Thursday.  Past Medical History:  Diagnosis Date   Anxiety    Barrett esophagus 2003   Diverticulitis    Diverticulosis    GERD (gastroesophageal reflux disease)    Glaucoma    Grave's disease    H. pylori infection    Hepatitis A    in her 15 from food   Hyperlipidemia    Hypothyroidism    Increased pressure in the eye    Insomnia    Morton's neuroma    Murmur    Scoliosis    Stroke ([place]) 12/2014   no deficits    Past Surgical History:  Procedure Laterality Date   AORTIC ARCH ANGIOGRAPHY N/A 01/01/2017   Procedure: AORTIC ARCH ANGIOGRAPHY;  Surgeon: Serafina Mitchell, MD;  Location: [facility];  Service: Cardiovascular;  Laterality: N/A;   APPENDECTOMY  1958   CATARACT EXTRACTION Left    CATARACT EXTRACTION  02/12/2018   CATARACT EXTRACTION W/PHACO Left 07/02/2016   Procedure: CATARACT EXTRACTION PHACO AND INTRAOCULAR LENS PLACEMENT ([place])  Left;  Surgeon: Ronnell Freshwater, MD;  Location: [facility];  Service: Ophthalmology;  Laterality: Left;   CATARACT EXTRACTION W/PHACO  Right 02/12/2018   Procedure: CATARACT EXTRACTION PHACO AND INTRAOCULAR LENS PLACEMENT ([place])  RIGHT;  Surgeon: Leandrew Koyanagi, MD;  Location: [facility];  Service: Ophthalmology;  Laterality: Right;   CERVICAL LAMINECTOMY  1995   COLON SURGERY     EYE SURGERY     due to graves disease   FEMUR SURGERY Right    benign bone growth   FOOT NEUROMA SURGERY Right    INTRAVASCULAR PRESSURE WIRE/FFR STUDY N/A 04/19/2017   Procedure: INTRAVASCULAR PRESSURE WIRE/FFR STUDY;  Surgeon: Nelva Bush, MD;  Location: [facility];  Service: Cardiovascular;  Laterality: N/A;   LAPAROSCOPIC ASSISTED VAGINAL HYSTERECTOMY     LAPAROSCOPIC SIGMOID COLECTOMY  05/13/06   Dr Zella Richer   LEFT HEART CATH AND CORONARY ANGIOGRAPHY N/A 04/19/2017   Procedure: LEFT HEART CATH AND CORONARY ANGIOGRAPHY;  Surgeon: Nelva Bush, MD;  Location: [facility];  Service: Cardiovascular;  Laterality: N/A;   LUMBAR DISC SURGERY  2009   PERIPHERAL VASCULAR INTERVENTION Left 01/01/2017   Procedure: PERIPHERAL VASCULAR INTERVENTION;  Surgeon: Serafina Mitchell, MD;  Location: [facility];  Service: Cardiovascular;  Laterality: Left;   SUBCLAVIAN STENT PLACEMENT     TONSILLECTOMY  1992   TRIGGER FINGER RELEASE Right 05/26/2018   Procedure: RIGHT INDEX RELEASE TRIGGER FINGER/A-1 PULLEY;  Surgeon: [name], MD;  Location: [facility];  Service: Orthopedics;  Laterality: Right;   TUBAL LIGATION     UPPER EXTREMITY ANGIOGRAPHY Left 01/01/2017   Procedure: Upper Extremity Angiography;  Surgeon: Serafina Mitchell, MD;  Location: [facility];  Service: Cardiovascular;  Laterality: Left;    Family History  Problem Relation Age of Onset   Other Father 16       SEPSIS   Hypertension Father    Stroke Father 61       CVA   Heart failure Mother 30   Heart murmur Mother    Heart attack Sister 43   Diabetes Brother    Breast cancer Sister 71   Stroke Sister    Cirrhosis Brother     Hypertension Sister        x 2   Diabetes Sister    Heart murmur Daughter    Breast cancer Maternal Aunt    Breast cancer Paternal Aunt    Breast cancer Cousin    Breast cancer Cousin    Breast cancer Cousin    Breast cancer Cousin     Social History   Socioeconomic History   Marital status: Married    Spouse name: Not on file   Number of children: 1   Years of education: Not on file   Highest education level: Not on file  Occupational History   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 [employer]  Tobacco Use   Smoking status: Former    Packs/day: 0.25    Types: Cigarettes    Quit date: 03/12/1978    Years since quitting: 42.5   Smokeless tobacco: Never   Tobacco comments:    quit in 1978  Vaping Use   Vaping Use: Never used  Substance and Sexual Activity   Alcohol use: Yes    Alcohol/week: 7.0 standard drinks    Types: 7 Glasses of wine per week   Drug use: No   Sexual activity: Not Currently    Birth control/protection: None  Other Topics Concern   Not on file  Social History Narrative   Not on file   Social Determinants of Health   Financial Resource Strain: Not on file  Food Insecurity: Not on file  Transportation Needs: Not on file  Physical Activity: Not on file  Stress: Not on file  Social Connections: Not on file  Intimate Partner Violence: Not on file    Outpatient Medications Prior to Visit  Medication Sig Dispense Refill   acetaminophen (TYLENOL) 500 MG tablet Take 500 mg by mouth daily as needed for moderate pain or headache.      Calcium Citrate-Vitamin D (CALCIUM CITRATE + PO) Take by mouth daily at 12 noon.     clopidogrel (PLAVIX) 75 MG tablet TAKE 1 TABLET BY MOUTH EVERY DAY 90 tablet 0   diazepam (VALIUM) 5 MG tablet Take 1 tablet (5 mg total) by mouth at bedtime as needed for anxiety (sleep). 30 tablet 0   dorzolamide-timolol (COSOPT) 22.3-6.8 MG/ML ophthalmic solution Place into both eyes daily.     isosorbide  mononitrate (IMDUR) 30 MG 24 hr tablet TAKE 0.5 TABLETS (15 MG TOTAL) BY MOUTH DAILY. 45 tablet 1   lansoprazole (PREVACID) 30 MG capsule Take 30 mg by mouth. One capsule 3 times weekly     levothyroxine (SYNTHROID) 88 MCG tablet Take 88 mcg by mouth daily.     Multiple Vitamin (MULTIVITAMIN) tablet Take 1 tablet by mouth daily.  rosuvastatin (CRESTOR) 40 MG tablet Take 1 tablet (40 mg total) by mouth daily. 90 tablet 2   benzonatate (TESSALON PERLES) 100 MG capsule Take 1 capsule (100 mg total) by mouth 3 (three) times daily as needed. 20 capsule 0   ciprofloxacin-dexamethasone (CIPRODEX) OTIC suspension Place 4 drops into both ears 2 (two) times daily. 7.5 mL 0   latanoprost (XALATAN) 0.005 % ophthalmic solution Place 1 drop into both eyes at bedtime.     No facility-administered medications prior to visit.    Allergies  Allergen Reactions   Neuromuscular Blocking Agents Other (See Comments)    Multiple symptoms, SOB, fatigue, weakness   Vicodin [Hydrocodone-Acetaminophen] Other (See Comments)    Altered mental state   Morphine And Related Rash    ROS Review of Systems  Constitutional:  Positive for fatigue. Negative for activity change and chills.  HENT:  Positive for congestion and voice change. Negative for postnasal drip, rhinorrhea, sinus pressure and sinus pain.   Eyes: Negative.   Respiratory:  Positive for cough and wheezing.   Cardiovascular:  Negative for chest pain and palpitations.  Gastrointestinal:  Negative for diarrhea, nausea and vomiting.  Endocrine: Negative for cold intolerance, heat intolerance, polydipsia and polyuria.  Genitourinary: Negative.   Musculoskeletal:  Negative for arthralgias and back pain.  Skin:  Negative for rash.  Allergic/Immunologic: Positive for environmental allergies.  Neurological:  Negative for dizziness, weakness and headaches.  Psychiatric/Behavioral:  Positive for sleep disturbance. The patient is nervous/anxious.       Objective:    Physical Exam Vitals and nursing note reviewed.  Constitutional:      Appearance: Normal appearance. She is well-developed. She is ill-appearing.  HENT:     Head: Normocephalic and atraumatic.     Right Ear: Ear canal and external ear normal.     Left Ear: Ear canal and external ear normal.     Nose: Nose normal.     Mouth/Throat:     Mouth: Mucous membranes are moist.     Pharynx: Oropharynx is clear.  Eyes:     Extraocular Movements: Extraocular movements intact.     Conjunctiva/sclera: Conjunctivae normal.     Pupils: Pupils are equal, round, and reactive to light.  Cardiovascular:     Rate and Rhythm: Normal rate and regular rhythm.     Pulses: Normal pulses.     Heart sounds: Normal heart sounds.  Pulmonary:     Effort: Pulmonary effort is normal.     Breath sounds: Normal breath sounds.     Comments: No wheezing currently auscultated in lung fields today.  Patient does have congested cough heard throughout the visit. Abdominal:     Palpations: Abdomen is soft.  Musculoskeletal:        General: Normal range of motion.     Cervical back: Normal range of motion and neck supple.  Skin:    General: Skin is warm and dry.     Capillary Refill: Capillary refill takes less than 2 seconds.  Neurological:     General: No focal deficit present.     Mental Status: She is alert and oriented to person, place, and time.  Psychiatric:        Mood and Affect: Mood normal.        Behavior: Behavior normal.        Thought Content: Thought content normal.        Judgment: Judgment normal.    Today's Vitals   09/27/20 0858  BP: 117/75  Pulse: 80  Temp: 97.6 F (36.4 C)  SpO2: 97%  Weight: 118 lb 4.8 oz (53.7 kg)  Height: 5\' 2"  (1.575 m)   Body mass index is 21.64 kg/m.   Wt Readings from Last 3 Encounters:  09/27/20 118 lb 4.8 oz (53.7 kg)  08/16/20 121 lb 9.6 oz (55.2 kg)  04/20/20 123 lb 6.4 oz (56 kg)     Health Maintenance Due  Topic Date Due    Hepatitis C Screening  Never done   TETANUS/TDAP  Never done   Zoster Vaccines- Shingrix (1 of 2) Never done   COVID-19 Vaccine (3 - Booster for Pfizer series) 09/26/2019    There are no preventive care reminders to display for this patient.  Lab Results  Component Value Date   TSH 0.693 10/21/2019   Lab Results  Component Value Date   WBC 4.2 10/21/2019   HGB 14.6 10/21/2019   HCT 45.8 10/21/2019   MCV 93 10/21/2019   PLT 231 10/21/2019   Lab Results  Component Value Date   NA 136 10/21/2019   K 5.1 10/21/2019   CO2 25 10/21/2019   GLUCOSE 101 (H) 10/21/2019   BUN 14 10/21/2019   CREATININE 0.80 12/17/2019   BILITOT 0.6 10/21/2019   ALKPHOS 86 10/21/2019   AST 27 10/21/2019   ALT 22 10/21/2019   PROT 7.2 10/21/2019   ALBUMIN 4.7 10/21/2019   CALCIUM 9.7 10/21/2019   ANIONGAP 8 09/23/2017   Lab Results  Component Value Date   CHOL 146 10/21/2019   Lab Results  Component Value Date   HDL 60 10/21/2019   Lab Results  Component Value Date   LDLCALC 73 10/21/2019   Lab Results  Component Value Date   TRIG 65 10/21/2019   Lab Results  Component Value Date   CHOLHDL 2.4 10/21/2019   Lab Results  Component Value Date   HGBA1C 5.9 (H) 10/21/2019      Assessment & Plan:  1. Acute non-recurrent pansinusitis Start amoxicillin 875 mg tablets twice daily for next 7 days.  May take over-the-counter medications to help reduce acute symptoms. - amoxicillin (AMOXIL) 875 MG tablet; Take 1 tablet (875 mg total) by mouth 2 (two) times daily.  Dispense: 14 tablet; Refill: 0  2. Cough And albuterol rescue inhaler.  We will use 2 inhalations up to 6 times daily as needed for wheezing and cough.  Will get chest x-ray for further evaluation. - DG Chest 2 View; Future - albuterol (VENTOLIN HFA) 108 (90 Base) MCG/ACT inhaler; Inhale 2 puffs into the lungs every 6 (six) hours as needed for wheezing or shortness of breath.  Dispense: 1 each; Refill: 2  3. Personal history  of COVID-19 Likely related to history of COVID-19 diagnosed 08/22/2020. - amoxicillin (AMOXIL) 875 MG tablet; Take 1 tablet (875 mg total) by mouth 2 (two) times daily.  Dispense: 14 tablet; Refill: 0   Problem List Items Addressed This Visit       Respiratory   Acute non-recurrent pansinusitis - Primary   Relevant Medications   amoxicillin (AMOXIL) 875 MG tablet     Other   Cough   Relevant Medications   albuterol (VENTOLIN HFA) 108 (90 Base) MCG/ACT inhaler   Other Relevant Orders   DG Chest 2 View   Personal history of COVID-19   Relevant Medications   amoxicillin (AMOXIL) 875 MG tablet    Meds ordered this encounter  Medications   amoxicillin (AMOXIL) 875 MG tablet    Sig:  Take 1 tablet (875 mg total) by mouth 2 (two) times daily.    Dispense:  14 tablet    Refill:  0    Order Specific Question:   Supervising Provider    Answer:   Beatrice Lecher D [[id number]]   albuterol (VENTOLIN HFA) 108 (90 Base) MCG/ACT inhaler    Sig: Inhale 2 puffs into the lungs every 6 (six) hours as needed for wheezing or shortness of breath.    Dispense:  1 each    Refill:  2    Order Specific Question:   Supervising Provider    Answer:   Beatrice Lecher D [[id number]]    Follow-up: Return for medicare wellness, as scheduled.   This note was dictated using Systems analyst. Rapid proofreading was performed to expedite the delivery of the information. Despite proofreading, phonetic errors will occur which are common with this voice recognition software. Please take this into consideration. If there are any concerns, please contact our office.    Ronnell Freshwater, NP

## 2020-09-27 NOTE — Progress Notes (Signed)
Please let the patient know that her chest x-ray was negative for pneumonia or other cardiopulmonary problems. Thanks.

## 2020-09-29 ENCOUNTER — Encounter: Payer: Self-pay | Admitting: Internal Medicine

## 2020-09-29 ENCOUNTER — Ambulatory Visit: Payer: Medicare HMO | Admitting: Internal Medicine

## 2020-09-29 ENCOUNTER — Other Ambulatory Visit: Payer: Self-pay

## 2020-09-29 VITALS — BP 136/82 | HR 82 | Ht 62.0 in | Wt 119.0 lb

## 2020-09-29 DIAGNOSIS — I25118 Atherosclerotic heart disease of native coronary artery with other forms of angina pectoris: Secondary | ICD-10-CM | POA: Diagnosis not present

## 2020-09-29 DIAGNOSIS — E785 Hyperlipidemia, unspecified: Secondary | ICD-10-CM

## 2020-09-29 DIAGNOSIS — I739 Peripheral vascular disease, unspecified: Secondary | ICD-10-CM

## 2020-09-29 NOTE — Patient Instructions (Signed)

## 2020-09-29 NOTE — Progress Notes (Signed)
Follow-up Outpatient Visit Date: 09/29/2020  Primary Care Provider: Lorrene Reid, PA-C [facility] [place] [address]  Chief Complaint: Follow-up CAD and PAD  HPI:  Ms. Thurow is a 75 y.o. female with history of nonobstructive coronary artery disease, peripheral vascular disease with right vertebral artery occlusion and left subclavian artery stenosis status post stenting (12/2016-followed by Dr. Trula Slade), stroke (2016), hyperlipidemia, hypothyroidism, and GERD, who presents for follow-up of coronary artery disease.  I last saw her in December, at which time she was feeling relatively well though she continued to have occasional chest discomfort when walking uphill.  This was had improved from prior visits.  Due to suboptimal lipid control, we agreed to escalate rosuvastatin to 40 mg daily.  No other medication changes or additional testing were pursued.  She was scheduled to be seen last month, though this was rescheduled due to recent COVID-19 infection.  Today, Ms. Chrisley reports that she is gradually recovering from COVID-19.  This manifested primarily with significant chest congestion, which is getting better.  She recently saw her PCP and had a chest radiograph which did not show any acute abnormalities.  She notes some tightness in her chest when coughing.  Prior to her COVID-19 infection, she was free of angina and able to exercise without any difficulty.  She has not had any edema nor orthopnea.  She notes occasional transient weakness in the right arm but has not had any pain or weakness in the left arm, where she previously underwent stenting of the subclavian artery.  --------------------------------------------------------------------------------------------------  Cardiovascular History & Procedures: Cardiovascular Problems: Peripheral vascular disease Stroke Stable angina with nonobstructive coronary artery disease   Risk Factors: Peripheral and  cerebrovascular disease, hyperlipidemia, and age greater than 26   Cath/PCI: LHC (04/19/17): LMCA normal. LAD with sequential 50% and 25% lesions (FFR 0.92). Ramus with minimal disease. LCx normal. RCA with mild diffuse disease. LVEDP 17 mmHg. LVEF 55-65%.   CV Surgery: Aortography and left subclavian angiography; intervention (01/01/17, Dr. Trula Slade): Occluded right vertebral artery. 80% left subclavian artery stenosis with successful intervention with Xpress 8 x 25 mm balloon-expandable stent.   EP Procedures and Devices: 30-day event monitor (11/28/16): Sinus rhythm. No significant arrhythmia.   Non-Invasive Evaluation(s): Carotid Doppler (04/04/2020): 1-39% stenosis in both internal carotid arteries.  Abselen right vertebral artery flow.  Normal bilateral subclavian artery flow hemodynamics. Cardiac CTA (12/17/2019): LMCA normal.  LAD with calcified plaque and 25-49% stenosis involving the proximal and mid segments.  Ramus intermedius with less than 25% calcified stenosis of the mid vessel.  LCx with less than 25% stenosis involving the mid vessel.  Normal RCA.  Coronary calcium score 302 (79th percentile).  Incidental note made of clustered area of tree-in-bud nodularity in the superior segment of the left lower lobe.  Findings most likely reflect inflammatory/infectious bronchiolitis. Carotid Doppler (02/17/18): 1-39% stenosis of both internal carotid arteries.  Right vertebral artery is occluded.  Left vertebral artery shows antegrade flow.  No significant subclavian artery stenosis bilaterally. Exercise MPI (11/22/16): Low risk study with small in size, mild in severity, fixed apical anterior and apical defect likely representing apical thinning. LVEF >65% with normal wall motion. Patient complained of fatigue during test, exercising 5:30 minutes. Upper extremity arterial Doppler (10/26/16): Right upper extremity evaluation normal. Left upper extremity demonstrates dampened biphasic to monophasic  waveforms and 20 mmHg pressure difference compared to the right. Carotid artery duplex (10/18/16): Bilateral 1-39% stenoses involving the right and left internal carotid arteries.  Transthoracic echocardiogram (12/28/14): Normal LV size and function. LVEF 60-65% with grade 1 diastolic dysfunction. Normal RV size and function. No significant vascular abnormalities.  Recent CV Pertinent Labs: Lab Results  Component Value Date   CHOL 146 10/21/2019   HDL 60 10/21/2019   LDLCALC 73 10/21/2019   TRIG 65 10/21/2019   CHOLHDL 2.4 10/21/2019   CHOLHDL 3.9 12/27/2014   INR 1.0 04/12/2017   K 5.1 10/21/2019   BUN 14 10/21/2019   CREATININE 0.80 12/17/2019    Past medical and surgical history were reviewed and updated in EPIC.  Current Meds  Medication Sig   acetaminophen (TYLENOL) 500 MG tablet Take 500 mg by mouth daily as needed for moderate pain or headache.    albuterol (VENTOLIN HFA) 108 (90 Base) MCG/ACT inhaler Inhale 2 puffs into the lungs every 6 (six) hours as needed for wheezing or shortness of breath.   amoxicillin (AMOXIL) 875 MG tablet Take 1 tablet (875 mg total) by mouth 2 (two) times daily.   Calcium Citrate-Vitamin D (CALCIUM CITRATE + PO) Take by mouth daily at 12 noon.   clopidogrel (PLAVIX) 75 MG tablet TAKE 1 TABLET BY MOUTH EVERY DAY   diazepam (VALIUM) 5 MG tablet Take 1 tablet (5 mg total) by mouth at bedtime as needed for anxiety (sleep).   dorzolamide-timolol (COSOPT) 22.3-6.8 MG/ML ophthalmic solution Place into both eyes daily.   isosorbide mononitrate (IMDUR) 30 MG 24 hr tablet TAKE 0.5 TABLETS (15 MG TOTAL) BY MOUTH DAILY.   lansoprazole (PREVACID) 30 MG capsule Take 30 mg by mouth. One capsule 3 times weekly   levothyroxine (SYNTHROID) 88 MCG tablet Take 88 mcg by mouth daily.   Multiple Vitamin (MULTIVITAMIN) tablet Take 1 tablet by mouth daily.    Allergies: Neuromuscular blocking agents, Atorvastatin, and Morphine and related  Social History   Tobacco Use    Smoking status: Former    Packs/day: 0.25    Types: Cigarettes    Quit date: 03/12/1978    Years since quitting: 42.5   Smokeless tobacco: Never   Tobacco comments:    quit in 1978  Vaping Use   Vaping Use: Never used  Substance Use Topics   Alcohol use: Yes    Alcohol/week: 7.0 standard drinks    Types: 7 Glasses of wine per week    Comment: 1 glass of wine every evening   Drug use: No    Family History  Problem Relation Age of Onset   Heart failure Mother 36   Heart murmur Mother    Other Father 17       SEPSIS   Hypertension Father    Stroke Father 27       CVA   Heart attack Sister 42   Breast cancer Sister 35   Stroke Sister    Hypertension Sister        x 2   Diabetes Sister    Diabetes Brother    Cirrhosis Brother    Breast cancer Maternal Aunt    Breast cancer Paternal Aunt    Heart murmur Daughter    Breast cancer Cousin    Breast cancer Cousin    Breast cancer Cousin    Breast cancer Cousin     Review of Systems: A 12-system review of systems was performed and was negative except as noted in the HPI.  --------------------------------------------------------------------------------------------------  Physical Exam: BP 136/82 (BP Location: Left Arm, Patient Position: Sitting, Cuff Size: Normal)   Pulse 82   Ht 5'  2" (1.575 m)   Wt 119 lb (54 kg)   SpO2 97%   BMI 21.77 kg/m   General:  NAD. Neck: No JVD or HJR. Lungs: Clear to auscultation bilaterally without wheezes or crackles. Heart: Regular rate and rhythm without murmurs, rubs, or gallops. Abdomen: Soft, nontender, nondistended. Extremities: No lower extremity edema.  2+ radial pulses bilaterally.  EKG: Normal sinus rhythm with poor R wave progression.  No significant change from prior tracing on 11/25/2019.  Lab Results  Component Value Date   WBC 4.2 10/21/2019   HGB 14.6 10/21/2019   HCT 45.8 10/21/2019   MCV 93 10/21/2019   PLT 231 10/21/2019    Lab Results  Component Value  Date   NA 136 10/21/2019   K 5.1 10/21/2019   CL 100 10/21/2019   CO2 25 10/21/2019   BUN 14 10/21/2019   CREATININE 0.80 12/17/2019   GLUCOSE 101 (H) 10/21/2019   ALT 22 10/21/2019    Lab Results  Component Value Date   CHOL 146 10/21/2019   HDL 60 10/21/2019   LDLCALC 73 10/21/2019   TRIG 65 10/21/2019   CHOLHDL 2.4 10/21/2019    --------------------------------------------------------------------------------------------------  ASSESSMENT AND PLAN: Coronary artery disease with stable angina: Areas have been doing well up until her recent COVID-19 infection and has had occasional tightness in her chest associated with coughing.  I suspect this is most likely musculoskeletal in nature.  EKG today is stable.  Coronary CTA last year was reassuring with nonobstructive CAD.  We will defer additional testing and continue to work with medical therapy and risk factor modification to prevent progression of disease.  PAD: No claudication reported in the left arm status post left subclavian stenting by Dr. Trula Slade.  Continue clopidogrel and statin therapy for secondary prevention.  Hyperlipidemia: Lipids borderline on last check in August, 2021.  Continue rosuvastatin 40 mg daily.  If LDL remains above 70 on next check through her PCP, addition of ezetimibe may need to be considered to target an LDL less than 70.  Follow-up: Return to clinic in 6 months  Nelva Bush, MD 09/30/2020 7:53 AM

## 2020-09-30 ENCOUNTER — Encounter: Payer: Self-pay | Admitting: Internal Medicine

## 2020-10-11 ENCOUNTER — Other Ambulatory Visit: Payer: Self-pay

## 2020-10-11 DIAGNOSIS — I959 Hypotension, unspecified: Secondary | ICD-10-CM

## 2020-10-11 DIAGNOSIS — E785 Hyperlipidemia, unspecified: Secondary | ICD-10-CM

## 2020-10-11 DIAGNOSIS — E039 Hypothyroidism, unspecified: Secondary | ICD-10-CM

## 2020-10-11 DIAGNOSIS — R7303 Prediabetes: Secondary | ICD-10-CM

## 2020-10-12 ENCOUNTER — Other Ambulatory Visit: Payer: Self-pay

## 2020-10-12 ENCOUNTER — Other Ambulatory Visit: Payer: Medicare HMO

## 2020-10-12 DIAGNOSIS — R7303 Prediabetes: Secondary | ICD-10-CM

## 2020-10-12 DIAGNOSIS — I959 Hypotension, unspecified: Secondary | ICD-10-CM | POA: Diagnosis not present

## 2020-10-12 DIAGNOSIS — E785 Hyperlipidemia, unspecified: Secondary | ICD-10-CM | POA: Diagnosis not present

## 2020-10-12 DIAGNOSIS — E039 Hypothyroidism, unspecified: Secondary | ICD-10-CM | POA: Diagnosis not present

## 2020-10-13 LAB — CBC WITH DIFFERENTIAL/PLATELET
Basophils Absolute: 0 10*3/uL (ref 0.0–0.2)
Basos: 1 %
EOS (ABSOLUTE): 0.2 10*3/uL (ref 0.0–0.4)
Eos: 4 %
Hematocrit: 46.6 % (ref 34.0–46.6)
Hemoglobin: 14.8 g/dL (ref 11.1–15.9)
Immature Grans (Abs): 0 10*3/uL (ref 0.0–0.1)
Immature Granulocytes: 0 %
Lymphocytes Absolute: 1.7 10*3/uL (ref 0.7–3.1)
Lymphs: 36 %
MCH: 30.3 pg (ref 26.6–33.0)
MCHC: 31.8 g/dL (ref 31.5–35.7)
MCV: 96 fL (ref 79–97)
Monocytes Absolute: 0.6 10*3/uL (ref 0.1–0.9)
Monocytes: 13 %
Neutrophils Absolute: 2.2 10*3/uL (ref 1.4–7.0)
Neutrophils: 46 %
Platelets: 236 10*3/uL (ref 150–450)
RBC: 4.88 x10E6/uL (ref 3.77–5.28)
RDW: 12.2 % (ref 11.7–15.4)
WBC: 4.8 10*3/uL (ref 3.4–10.8)

## 2020-10-13 LAB — COMPREHENSIVE METABOLIC PANEL
ALT: 21 IU/L (ref 0–32)
AST: 30 IU/L (ref 0–40)
Albumin/Globulin Ratio: 2.1 (ref 1.2–2.2)
Albumin: 4.9 g/dL — ABNORMAL HIGH (ref 3.7–4.7)
Alkaline Phosphatase: 100 IU/L (ref 44–121)
BUN/Creatinine Ratio: 16 (ref 12–28)
BUN: 13 mg/dL (ref 8–27)
Bilirubin Total: 0.4 mg/dL (ref 0.0–1.2)
CO2: 25 mmol/L (ref 20–29)
Calcium: 10 mg/dL (ref 8.7–10.3)
Chloride: 101 mmol/L (ref 96–106)
Creatinine, Ser: 0.8 mg/dL (ref 0.57–1.00)
Globulin, Total: 2.3 g/dL (ref 1.5–4.5)
Glucose: 110 mg/dL — ABNORMAL HIGH (ref 65–99)
Potassium: 4.9 mmol/L (ref 3.5–5.2)
Sodium: 140 mmol/L (ref 134–144)
Total Protein: 7.2 g/dL (ref 6.0–8.5)
eGFR: 77 mL/min/{1.73_m2} (ref >59)

## 2020-10-13 LAB — LIPID PANEL
Chol/HDL Ratio: 2.4 ratio (ref 0.0–4.4)
Cholesterol, Total: 142 mg/dL (ref 100–199)
HDL: 58 mg/dL (ref >39)
LDL Chol Calc (NIH): 70 mg/dL (ref 0–99)
Triglycerides: 72 mg/dL (ref 0–149)
VLDL Cholesterol Cal: 14 mg/dL (ref 5–40)

## 2020-10-13 LAB — HEMOGLOBIN A1C
Est. average glucose Bld gHb Est-mCnc: 126 mg/dL
Hgb A1c MFr Bld: 6 % — ABNORMAL HIGH (ref 4.8–5.6)

## 2020-10-13 LAB — TSH: TSH: 2.11 u[IU]/mL (ref 0.450–4.500)

## 2020-10-19 ENCOUNTER — Encounter: Payer: Self-pay | Admitting: Physician Assistant

## 2020-10-19 ENCOUNTER — Ambulatory Visit (INDEPENDENT_AMBULATORY_CARE_PROVIDER_SITE_OTHER): Payer: Medicare HMO | Admitting: Physician Assistant

## 2020-10-19 VITALS — BP 118/71 | HR 69 | Temp 97.6°F | Ht 62.0 in | Wt 118.0 lb

## 2020-10-19 DIAGNOSIS — Z0000 Encounter for general adult medical examination without abnormal findings: Secondary | ICD-10-CM

## 2020-10-19 NOTE — Patient Instructions (Signed)
Preventive Care 76 Years and Older, Female Preventive care refers to lifestyle choices and visits with your health care provider that can promote health and wellness. This includes: A yearly physical exam. This is also called an annual wellness visit. Regular dental and eye exams. Immunizations. Screening for certain conditions. Healthy lifestyle choices, such as: Eating a healthy diet. Getting regular exercise. Not using drugs or products that contain nicotine and tobacco. Limiting alcohol use. What can I expect for my preventive care visit? Physical exam Your health care provider will check your: Height and weight. These may be used to calculate your BMI (body mass index). BMI is a measurement that tells if you are at a healthy weight. Heart rate and blood pressure. Body temperature. Skin for abnormal spots. Counseling Your health care provider may ask you questions about your: Past medical problems. Family's medical history. Alcohol, tobacco, and drug use. Emotional well-being. Home life and relationship well-being. Sexual activity. Diet, exercise, and sleep habits. History of falls. Memory and ability to understand (cognition). Work and work Statistician. Pregnancy and menstrual history. Access to firearms. What immunizations do I need?  Vaccines are usually given at various ages, according to a schedule. Your health care provider will recommend vaccines for you based on your age, medicalhistory, and lifestyle or other factors, such as travel or where you work. What tests do I need? Blood tests Lipid and cholesterol levels. These may be checked every 5 years, or more often depending on your overall health. Hepatitis C test. Hepatitis B test. Screening Lung cancer screening. You may have this screening every year starting at age 44 if you have a 30-pack-year history of smoking and currently smoke or have quit within the past 15 years. Colorectal cancer screening. All  adults should have this screening starting at age 39 and continuing until age 65. Your health care provider may recommend screening at age 61 if you are at increased risk. You will have tests every 1-10 years, depending on your results and the type of screening test. Diabetes screening. This is done by checking your blood sugar (glucose) after you have not eaten for a while (fasting). You may have this done every 1-3 years. Mammogram. This may be done every 1-2 years. Talk with your health care provider about how often you should have regular mammograms. Abdominal aortic aneurysm (AAA) screening. You may need this if you are a current or former smoker. BRCA-related cancer screening. This may be done if you have a family history of breast, ovarian, tubal, or peritoneal cancers. Other tests STD (sexually transmitted disease) testing, if you are at risk. Bone density scan. This is done to screen for osteoporosis. You may have this done starting at age 54. Talk with your health care provider about your test results, treatment options,and if necessary, the need for more tests. Follow these instructions at home: Eating and drinking  Eat a diet that includes fresh fruits and vegetables, whole grains, lean protein, and low-fat dairy products. Limit your intake of foods with high amounts of sugar, saturated fats, and salt. Take vitamin and mineral supplements as recommended by your health care provider. Do not drink alcohol if your health care provider tells you not to drink. If you drink alcohol: Limit how much you have to 0-1 drink a day. Be aware of how much alcohol is in your drink. In the [place] one drink equals one 12 oz bottle of beer (355 mL), one 5 oz glass of wine (148 mL), or one 1  oz glass of hard liquor (44 mL).  Lifestyle Take daily care of your teeth and gums. Brush your teeth every morning and night with fluoride toothpaste. Floss one time each day. Stay active. Exercise for at  least 30 minutes 5 or more days each week. Do not use any products that contain nicotine or tobacco, such as cigarettes, e-cigarettes, and chewing tobacco. If you need help quitting, ask your health care provider. Do not use drugs. If you are sexually active, practice safe sex. Use a condom or other form of protection in order to prevent STIs (sexually transmitted infections). Talk with your health care provider about taking a low-dose aspirin or statin. Find healthy ways to cope with stress, such as: Meditation, yoga, or listening to music. Journaling. Talking to a trusted person. Spending time with friends and family. Safety Always wear your seat belt while driving or riding in a vehicle. Do not drive: If you have been drinking alcohol. Do not ride with someone who has been drinking. When you are tired or distracted. While texting. Wear a helmet and other protective equipment during sports activities. If you have firearms in your house, make sure you follow all gun safety procedures. What's next? Visit your health care provider once a year for an annual wellness visit. Ask your health care provider how often you should have your eyes and teeth checked. Stay up to date on all vaccines. This information is not intended to replace advice given to you by your health care provider. Make sure you discuss any questions you have with your healthcare provider. Document Revised: 02/17/2020 Document Reviewed: 02/20/2018 Elsevier Patient Education  2022 [employer].

## 2020-10-19 NOTE — Progress Notes (Signed)
Virtual Visit via Telephone Note:  I connected with [name] by telephone and verified that I am speaking with the correct person using two identifiers.    I discussed the limitations, risks, security and privacy concerns for performing an evaluation and management service by telephone and the availability of in person appointments. The staff discussed with patient that there may be a patient responsible charge related to this service. The patient expressed understanding and agreed to proceed.   Location of Patient- Home Location of Provider- Office   Subjective:   [name] is a 75 y.o. female who presents for Medicare Annual (Subsequent) preventive examination.  Review of Systems    General:   No F/C, wt loss Pulm:   No DIB, SOB, pleuritic chest pain Card:  No CP, palpitations Abd:  No n/v/d or pain Ext:  No inc edema from baseline    Objective:    Today's Vitals   10/19/20 0853  BP: 118/71  Pulse: 69  Temp: 97.6 F (36.4 C)  Weight: 118 lb (53.5 kg)  Height: '5\' 2"'$  (1.575 m)   Body mass index is 21.58 kg/m.  Advanced Directives 05/16/2018 02/17/2018 02/12/2018 07/26/2017 04/19/2017 12/26/2016 08/10/2016  Does Patient Have a Medical Advance Directive? Yes Yes Yes No Yes Yes Yes  Type of Paramedic of [place];Living will Living will;Healthcare Power of [place];Living will - [place];Living will [place];Living will -  Does patient want to make changes to medical advance directive? No - Patient declined - No - Patient declined - No - Patient declined - -  Copy of [place] in Chart? No - copy requested - No - copy requested - No - copy requested - -  Would patient like information on creating a medical advance directive? - - - - - - -    Current Medications (verified) Outpatient Encounter Medications as of 10/19/2020  Medication Sig   acetaminophen (TYLENOL)  500 MG tablet Take 500 mg by mouth daily as needed for moderate pain or headache.    albuterol (VENTOLIN HFA) 108 (90 Base) MCG/ACT inhaler Inhale 2 puffs into the lungs every 6 (six) hours as needed for wheezing or shortness of breath.   Calcium Citrate-Vitamin D (CALCIUM CITRATE + PO) Take by mouth daily at 12 noon.   clopidogrel (PLAVIX) 75 MG tablet TAKE 1 TABLET BY MOUTH EVERY DAY   diazepam (VALIUM) 5 MG tablet Take 1 tablet (5 mg total) by mouth at bedtime as needed for anxiety (sleep).   dorzolamide-timolol (COSOPT) 22.3-6.8 MG/ML ophthalmic solution Place into both eyes daily.   isosorbide mononitrate (IMDUR) 30 MG 24 hr tablet TAKE 0.5 TABLETS (15 MG TOTAL) BY MOUTH DAILY.   levothyroxine (SYNTHROID) 88 MCG tablet Take 88 mcg by mouth daily.   Multiple Vitamin (MULTIVITAMIN) tablet Take 1 tablet by mouth daily.   rosuvastatin (CRESTOR) 40 MG tablet Take 1 tablet (40 mg total) by mouth daily.   lansoprazole (PREVACID) 30 MG capsule Take 30 mg by mouth. One capsule 3 times weekly (Patient not taking: Reported on 10/19/2020)   [DISCONTINUED] amoxicillin (AMOXIL) 875 MG tablet Take 1 tablet (875 mg total) by mouth 2 (two) times daily. (Patient not taking: Reported on 10/19/2020)   No facility-administered encounter medications on file as of 10/19/2020.    Allergies (verified) Neuromuscular blocking agents, Atorvastatin, and Morphine and related   History: Past Medical History:  Diagnosis Date   Anxiety  Barrett esophagus 2003   Diverticulitis    Diverticulosis    GERD (gastroesophageal reflux disease)    Glaucoma    Grave's disease    H. pylori infection    Hepatitis A    in her 76 from food   Hyperlipidemia    Hypothyroidism    Increased pressure in the eye    Insomnia    Morton's neuroma    Murmur    Scoliosis    Stroke ([place]) 12/2014   no deficits   Past Surgical History:  Procedure Laterality Date   AORTIC ARCH ANGIOGRAPHY N/A 01/01/2017   Procedure: AORTIC ARCH  ANGIOGRAPHY;  Surgeon: Serafina Mitchell, MD;  Location: [facility];  Service: Cardiovascular;  Laterality: N/A;   APPENDECTOMY  1958   CATARACT EXTRACTION Left    CATARACT EXTRACTION  02/12/2018   CATARACT EXTRACTION W/PHACO Left 07/02/2016   Procedure: CATARACT EXTRACTION PHACO AND INTRAOCULAR LENS PLACEMENT ([place])  Left;  Surgeon: Ronnell Freshwater, MD;  Location: [facility];  Service: Ophthalmology;  Laterality: Left;   CATARACT EXTRACTION W/PHACO Right 02/12/2018   Procedure: CATARACT EXTRACTION PHACO AND INTRAOCULAR LENS PLACEMENT ([place])  RIGHT;  Surgeon: Leandrew Koyanagi, MD;  Location: [facility];  Service: Ophthalmology;  Laterality: Right;   CERVICAL LAMINECTOMY  1995   COLON SURGERY     EYE SURGERY     due to graves disease   FEMUR SURGERY Right    benign bone growth   FOOT NEUROMA SURGERY Right    INTRAVASCULAR PRESSURE WIRE/FFR STUDY N/A 04/19/2017   Procedure: INTRAVASCULAR PRESSURE WIRE/FFR STUDY;  Surgeon: Nelva Bush, MD;  Location: [facility];  Service: Cardiovascular;  Laterality: N/A;   LAPAROSCOPIC ASSISTED VAGINAL HYSTERECTOMY     LAPAROSCOPIC SIGMOID COLECTOMY  05/13/06   Dr Zella Richer   LEFT HEART CATH AND CORONARY ANGIOGRAPHY N/A 04/19/2017   Procedure: LEFT HEART CATH AND CORONARY ANGIOGRAPHY;  Surgeon: Nelva Bush, MD;  Location: [facility];  Service: Cardiovascular;  Laterality: N/A;   LUMBAR DISC SURGERY  2009   PERIPHERAL VASCULAR INTERVENTION Left 01/01/2017   Procedure: PERIPHERAL VASCULAR INTERVENTION;  Surgeon: Serafina Mitchell, MD;  Location: [facility];  Service: Cardiovascular;  Laterality: Left;   SUBCLAVIAN STENT PLACEMENT     TONSILLECTOMY  1992   TRIGGER FINGER RELEASE Right 05/26/2018   Procedure: RIGHT INDEX RELEASE TRIGGER FINGER/A-1 PULLEY;  Surgeon: [name], MD;  Location: [facility];  Service: Orthopedics;  Laterality: Right;   TUBAL LIGATION     UPPER EXTREMITY  ANGIOGRAPHY Left 01/01/2017   Procedure: Upper Extremity Angiography;  Surgeon: Serafina Mitchell, MD;  Location: [facility];  Service: Cardiovascular;  Laterality: Left;   Family History  Problem Relation Age of Onset   Heart failure Mother 33   Heart murmur Mother    Other Father 78       SEPSIS   Hypertension Father    Stroke Father 1       CVA   Heart attack Sister 68   Breast cancer Sister 69   Stroke Sister    Hypertension Sister        x 2   Diabetes Sister    Diabetes Brother    Cirrhosis Brother    Breast cancer Maternal Aunt    Breast cancer Paternal Aunt    Heart murmur Daughter    Breast cancer Cousin    Breast cancer Cousin    Breast cancer  Cousin    Breast cancer Cousin    Social History   Socioeconomic History   Marital status: Married    Spouse name: Not on file   Number of children: 1   Years of education: Not on file   Highest education level: Not on file  Occupational History   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 [employer]  Tobacco Use   Smoking status: Former    Packs/day: 0.25    Types: Cigarettes    Quit date: 03/12/1978    Years since quitting: 42.6   Smokeless tobacco: Never   Tobacco comments:    quit in 1978  Vaping Use   Vaping Use: Never used  Substance and Sexual Activity   Alcohol use: Yes    Alcohol/week: 7.0 standard drinks    Types: 7 Glasses of wine per week    Comment: 1 glass of wine every evening   Drug use: No   Sexual activity: Not Currently    Birth control/protection: None  Other Topics Concern   Not on file  Social History Narrative   Not on file   Social Determinants of Health   Financial Resource Strain: Not on file  Food Insecurity: Not on file  Transportation Needs: Not on file  Physical Activity: Not on file  Stress: Not on file  Social Connections: Not on file    Tobacco Counseling Counseling given: Not Answered Tobacco comments: quit in  1978    Diabetic?No         Activities of Daily Living In your present state of health, do you have any difficulty performing the following activities: 10/19/2020 09/27/2020  Hearing? N N  Vision? N Y  Difficulty concentrating or making decisions? N N  Walking or climbing stairs? N N  Dressing or bathing? N N  Doing errands, shopping? N N  Some recent data might be hidden    Patient Care Team: Lorrene Reid, PA-C as PCP - General (Physician Assistant) End, Harrell Gave, MD as PCP - Cardiology (Cardiology) Almedia Balls, MD as Consulting Physician (Orthopedic Surgery) Garvin Fila, MD as Consulting Physician (Neurology) Milus Banister, MD as Attending Physician (Gastroenterology) Karren Burly Deirdre Peer, MD as Referring Physician (Ophthalmology)  Indicate any recent Medical Services you may have received from other than [facility] providers in the past year (date may be approximate).     Assessment:   This is a routine wellness examination for Sheryll.  Hearing/Vision screen No results found.  Dietary issues and exercise activities discussed: -Continue to stay active with yoga and water aerobics. Continue with low carbohydrate and heart healthy diet.   Goals Addressed   None   Depression Screen PHQ 2/9 Scores 10/19/2020 09/27/2020 08/16/2020 04/20/2020 10/19/2019 04/20/2019 09/18/2018  PHQ - 2 Score 0 0 0 0 0 0 0  PHQ- 9 Score 0 0 0 0 0 0 3    Fall Risk Fall Risk  10/19/2020 09/27/2020 08/16/2020 04/20/2020 10/19/2019  Falls in the past year? 0 0 0 0 0  Number falls in past yr: 0 0 0 - -  Injury with Fall? 0 0 0 - -  Risk for fall due to : No Fall Risks - - - No Fall Risks  Follow up Falls evaluation completed Falls evaluation completed Falls evaluation completed Falls evaluation completed Falls evaluation completed    [place]:  Any stairs in or around the home? Yes  If so, are there any  without handrails? Yes  Home free of loose throw rugs  in walkways, pet beds, electrical cords, etc? No  Adequate lighting in your home to reduce risk of falls? Yes   ASSISTIVE DEVICES UTILIZED TO PREVENT FALLS:  Life alert? No  Use of a cane, walker or w/c? No  Grab bars in the bathroom? Yes  Shower chair or bench in shower? Yes  Elevated toilet seat or a handicapped toilet? Yes   TIMED UP AND GO:  Was the test performed? No .  Length of time to ambulate 10 feet: 0 sec.   Telehealth  Cognitive Function: normal  6CIT Screen 10/19/2020 10/19/2019 09/17/2016  What Year? 0 points 0 points 0 points  What month? 0 points 0 points 0 points  What time? 0 points 0 points 0 points  Count back from 20 0 points 0 points 0 points  Months in reverse 0 points 0 points 0 points  Repeat phrase 0 points 4 points 0 points  Total Score 0 4 0    Immunizations Immunization History  Administered Date(s) Administered   Influenza, High Dose Seasonal PF 02/24/2015   Influenza-Unspecified 01/09/2016, 12/12/2016, 12/11/2018   PFIZER(Purple Top)SARS-COV-2 Vaccination 04/08/2019, 04/29/2019   Pneumococcal Conjugate-13 12/12/2016   Pneumococcal Polysaccharide-23 09/18/2018    TDAP status: Due, Education has been provided regarding the importance of this vaccine. Advised may receive this vaccine at local pharmacy or Health Dept. Aware to provide a copy of the vaccination record if obtained from local pharmacy or Health Dept. Verbalized acceptance and understanding.  Flu Vaccine status: Declined, Education has been provided regarding the importance of this vaccine but patient still declined. Advised may receive this vaccine at local pharmacy or Health Dept. Aware to provide a copy of the vaccination record if obtained from local pharmacy or Health Dept. Verbalized acceptance and understanding.  Pneumococcal vaccine status: Up to date  Covid-19 vaccine status: Completed vaccines  Qualifies for Shingles Vaccine? Yes   Zostavax completed No   Shingrix  Completed?: No.    Education has been provided regarding the importance of this vaccine. Patient has been advised to call insurance company to determine out of pocket expense if they have not yet received this vaccine. Advised may also receive vaccine at local pharmacy or Health Dept. Verbalized acceptance and understanding.  Screening Tests Health Maintenance  Topic Date Due   Hepatitis C Screening  Never done   TETANUS/TDAP  Never done   Zoster Vaccines- Shingrix (1 of 2) Never done   COVID-19 Vaccine (3 - Booster for Pfizer series) 09/26/2019   INFLUENZA VACCINE  10/10/2020   COLONOSCOPY (Pts 45-61yr Insurance coverage will need to be confirmed)  08/11/2026   DEXA SCAN  Completed   PNA vac Low Risk Adult  Completed   HPV VACCINES  Aged Out    Health Maintenance  Health Maintenance Due  Topic Date Due   Hepatitis C Screening  Never done   TETANUS/TDAP  Never done   Zoster Vaccines- Shingrix (1 of 2) Never done   COVID-19 Vaccine (3 - Booster for Pfizer series) 09/26/2019   INFLUENZA VACCINE  10/10/2020    Colorectal cancer screening: Type of screening: Colonoscopy. Completed 2018. Repeat every 5 years  Mammogram status: Completed 12/2019. Repeat every year    Lung Cancer Screening: (Low Dose CT Chest recommended if Age 76-80years, 30 pack-year currently smoking OR have quit w/in 15years.) does not qualify.   Lung Cancer Screening Referral:   Additional Screening:  Hepatitis C Screening:  does qualify; Completed   Vision Screening: Recommended annual ophthalmology exams for early detection of glaucoma and other disorders of the eye. Is the patient up to date with their annual eye exam?  Yes  Who is the provider or what is the name of the office in which the patient attends annual eye exams? Dr. Murlean Iba If pt is not established with a provider, would they like to be referred to a provider to establish care? No .   Dental Screening: Recommended annual dental exams for  proper oral hygiene  Community Resource Referral / Chronic Care Management: CRR required this visit?  No   CCM required this visit?  No      Plan:  -Discussed most recent lab results which are essentially within normal limits or stable from prior. -Continue to follow up with various specialists.  -Follow up in 6 months for HLD, prediabetes and FBW  (lipid panel, cmp, a1c)  I have personally reviewed and noted the following in the patient's chart:   Medical and social history Use of alcohol, tobacco or illicit drugs  Current medications and supplements including opioid prescriptions.  Functional ability and status Nutritional status Physical activity Advanced directives List of other physicians Hospitalizations, surgeries, and ER visits in previous 12 months Vitals Screenings to include cognitive, depression, and falls Referrals and appointments  In addition, I have reviewed and discussed with patient certain preventive protocols, quality metrics, and best practice recommendations. A written personalized care plan for preventive services as well as general preventive health recommendations were provided to patient.

## 2020-10-21 DIAGNOSIS — H401133 Primary open-angle glaucoma, bilateral, severe stage: Secondary | ICD-10-CM | POA: Diagnosis not present

## 2020-11-18 ENCOUNTER — Other Ambulatory Visit: Payer: Self-pay | Admitting: Internal Medicine

## 2020-12-14 ENCOUNTER — Other Ambulatory Visit: Payer: Self-pay | Admitting: Physician Assistant

## 2020-12-14 DIAGNOSIS — Z1231 Encounter for screening mammogram for malignant neoplasm of breast: Secondary | ICD-10-CM

## 2020-12-23 DIAGNOSIS — H401133 Primary open-angle glaucoma, bilateral, severe stage: Secondary | ICD-10-CM | POA: Diagnosis not present

## 2020-12-25 ENCOUNTER — Other Ambulatory Visit: Payer: Self-pay | Admitting: Internal Medicine

## 2020-12-25 ENCOUNTER — Other Ambulatory Visit: Payer: Self-pay | Admitting: Physician Assistant

## 2021-01-02 ENCOUNTER — Other Ambulatory Visit: Payer: Self-pay | Admitting: Internal Medicine

## 2021-01-13 ENCOUNTER — Other Ambulatory Visit: Payer: Self-pay

## 2021-01-13 ENCOUNTER — Ambulatory Visit
Admission: RE | Admit: 2021-01-13 | Discharge: 2021-01-13 | Disposition: A | Discharge status: Home / Self Care | Payer: Medicare HMO | Source: Ambulatory Visit

## 2021-01-13 DIAGNOSIS — Z1231 Encounter for screening mammogram for malignant neoplasm of breast: Secondary | ICD-10-CM | POA: Diagnosis not present

## 2021-01-16 DIAGNOSIS — E039 Hypothyroidism, unspecified: Secondary | ICD-10-CM | POA: Diagnosis not present

## 2021-02-07 DIAGNOSIS — K2270 Barrett's esophagus without dysplasia: Secondary | ICD-10-CM | POA: Diagnosis not present

## 2021-02-07 DIAGNOSIS — I77819 Aortic ectasia, unspecified site: Secondary | ICD-10-CM | POA: Diagnosis not present

## 2021-02-07 DIAGNOSIS — E1159 Type 2 diabetes mellitus with other circulatory complications: Secondary | ICD-10-CM | POA: Diagnosis not present

## 2021-02-07 DIAGNOSIS — K5900 Constipation, unspecified: Secondary | ICD-10-CM | POA: Diagnosis not present

## 2021-02-07 DIAGNOSIS — N3941 Urge incontinence: Secondary | ICD-10-CM | POA: Diagnosis not present

## 2021-02-07 DIAGNOSIS — K2100 Gastro-esophageal reflux disease with esophagitis, without bleeding: Secondary | ICD-10-CM | POA: Diagnosis not present

## 2021-02-07 DIAGNOSIS — H409 Unspecified glaucoma: Secondary | ICD-10-CM | POA: Diagnosis not present

## 2021-02-07 DIAGNOSIS — E785 Hyperlipidemia, unspecified: Secondary | ICD-10-CM | POA: Diagnosis not present

## 2021-02-07 DIAGNOSIS — I25119 Atherosclerotic heart disease of native coronary artery with unspecified angina pectoris: Secondary | ICD-10-CM | POA: Diagnosis not present

## 2021-02-07 DIAGNOSIS — I771 Stricture of artery: Secondary | ICD-10-CM | POA: Diagnosis not present

## 2021-02-07 DIAGNOSIS — E1151 Type 2 diabetes mellitus with diabetic peripheral angiopathy without gangrene: Secondary | ICD-10-CM | POA: Diagnosis not present

## 2021-02-07 DIAGNOSIS — E890 Postprocedural hypothyroidism: Secondary | ICD-10-CM | POA: Diagnosis not present

## 2021-02-07 DIAGNOSIS — R69 Illness, unspecified: Secondary | ICD-10-CM | POA: Diagnosis not present

## 2021-02-28 ENCOUNTER — Encounter: Payer: Self-pay | Admitting: Physician Assistant

## 2021-02-28 ENCOUNTER — Ambulatory Visit (INDEPENDENT_AMBULATORY_CARE_PROVIDER_SITE_OTHER): Payer: Medicare HMO | Admitting: Physician Assistant

## 2021-02-28 ENCOUNTER — Other Ambulatory Visit: Payer: Self-pay

## 2021-02-28 ENCOUNTER — Other Ambulatory Visit: Payer: Self-pay | Admitting: Physician Assistant

## 2021-02-28 VITALS — BP 117/65 | HR 58 | Temp 97.2°F | Ht 62.0 in | Wt 122.8 lb

## 2021-02-28 DIAGNOSIS — R112 Nausea with vomiting, unspecified: Secondary | ICD-10-CM

## 2021-02-28 DIAGNOSIS — R1010 Upper abdominal pain, unspecified: Secondary | ICD-10-CM | POA: Diagnosis not present

## 2021-02-28 LAB — POCT URINALYSIS DIPSTICK
Bilirubin, UA: NEGATIVE
Blood, UA: NEGATIVE
Glucose, UA: NEGATIVE
Ketones, UA: NEGATIVE
Leukocytes, UA: NEGATIVE
Nitrite, UA: NEGATIVE
Protein, UA: NEGATIVE
Spec Grav, UA: 1.01 (ref 1.010–1.025)
Urobilinogen, UA: 0.2 E.U./dL
pH, UA: 6 (ref 5.0–8.0)

## 2021-02-28 NOTE — Addendum Note (Signed)
Addended by: Aron Baba on: 02/28/2021 01:05 PM   Modules accepted: Orders

## 2021-02-28 NOTE — Progress Notes (Signed)
Acute Office Visit  Subjective:    Patient ID: [name], female    DOB: [birth year], 75 y.o.   MRN: [id number]  Chief Complaint  Patient presents with   Acute Visit    HPI Patient is in today for c/o upper abdominal pain which has been intermittent and ongoing for quite some time but has been more frequent within the last month. Pain onset is about 30 minutes after eating. Feels pressure at flank area of right side. Pain radiates to the back. Also reports nausea the last couple of weeks. Has noticed urine color is brick color the past 2 weeks. No belching,bloating or fever. Does report altered bowel habits (diarrhea and constipation) and some mild night sweats.  Past Medical History:  Diagnosis Date   Anxiety    Barrett esophagus 2003   Diverticulitis    Diverticulosis    GERD (gastroesophageal reflux disease)    Glaucoma    Grave's disease    H. pylori infection    Hepatitis A    in her 80 from food   Hyperlipidemia    Hypothyroidism    Increased pressure in the eye    Insomnia    Morton's neuroma    Murmur    Scoliosis    Stroke ([place]) 12/2014   no deficits    Past Surgical History:  Procedure Laterality Date   AORTIC ARCH ANGIOGRAPHY N/A 01/01/2017   Procedure: AORTIC ARCH ANGIOGRAPHY;  Surgeon: Serafina Mitchell, MD;  Location: [facility];  Service: Cardiovascular;  Laterality: N/A;   APPENDECTOMY  1958   CATARACT EXTRACTION Left    CATARACT EXTRACTION  02/12/2018   CATARACT EXTRACTION W/PHACO Left 07/02/2016   Procedure: CATARACT EXTRACTION PHACO AND INTRAOCULAR LENS PLACEMENT ([place])  Left;  Surgeon: Ronnell Freshwater, MD;  Location: [facility];  Service: Ophthalmology;  Laterality: Left;   CATARACT EXTRACTION W/PHACO Right 02/12/2018   Procedure: CATARACT EXTRACTION PHACO AND INTRAOCULAR LENS PLACEMENT ([place])  RIGHT;  Surgeon: Leandrew Koyanagi, MD;  Location: [facility];  Service: Ophthalmology;  Laterality: Right;   CERVICAL  LAMINECTOMY  1995   COLON SURGERY     EYE SURGERY     due to graves disease   FEMUR SURGERY Right    benign bone growth   FOOT NEUROMA SURGERY Right    INTRAVASCULAR PRESSURE WIRE/FFR STUDY N/A 04/19/2017   Procedure: INTRAVASCULAR PRESSURE WIRE/FFR STUDY;  Surgeon: Nelva Bush, MD;  Location: [facility];  Service: Cardiovascular;  Laterality: N/A;   LAPAROSCOPIC ASSISTED VAGINAL HYSTERECTOMY     LAPAROSCOPIC SIGMOID COLECTOMY  05/13/06   Dr Zella Richer   LEFT HEART CATH AND CORONARY ANGIOGRAPHY N/A 04/19/2017   Procedure: LEFT HEART CATH AND CORONARY ANGIOGRAPHY;  Surgeon: Nelva Bush, MD;  Location: [facility];  Service: Cardiovascular;  Laterality: N/A;   LUMBAR DISC SURGERY  2009   PERIPHERAL VASCULAR INTERVENTION Left 01/01/2017   Procedure: PERIPHERAL VASCULAR INTERVENTION;  Surgeon: Serafina Mitchell, MD;  Location: [facility];  Service: Cardiovascular;  Laterality: Left;   SUBCLAVIAN STENT PLACEMENT     TONSILLECTOMY  1992   TRIGGER FINGER RELEASE Right 05/26/2018   Procedure: RIGHT INDEX RELEASE TRIGGER FINGER/A-1 PULLEY;  Surgeon: [name], MD;  Location: [facility];  Service: Orthopedics;  Laterality: Right;   TUBAL LIGATION     UPPER EXTREMITY ANGIOGRAPHY Left 01/01/2017   Procedure: Upper Extremity Angiography;  Surgeon: Serafina Mitchell, MD;  Location: [facility];  Service: Cardiovascular;  Laterality: Left;    Family History  Problem Relation Age of Onset   Heart failure Mother 2   Heart murmur Mother    Other Father 58       SEPSIS   Hypertension Father    Stroke Father 44       CVA   Heart attack Sister 13   Breast cancer Sister 30   Stroke Sister    Hypertension Sister        x 2   Diabetes Sister    Diabetes Brother    Cirrhosis Brother    Breast cancer Maternal Aunt    Breast cancer Paternal Aunt    Heart murmur Daughter    Breast cancer Cousin    Breast cancer Cousin    Breast cancer Cousin     Breast cancer Cousin     Social History   Socioeconomic History   Marital status: Married    Spouse name: Not on file   Number of children: 1   Years of education: Not on file   Highest education level: Not on file  Occupational History   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 [employer]  Tobacco Use   Smoking status: Former    Packs/day: 0.25    Types: Cigarettes    Quit date: 03/12/1978    Years since quitting: 42.9   Smokeless tobacco: Never   Tobacco comments:    quit in 1978  Vaping Use   Vaping Use: Never used  Substance and Sexual Activity   Alcohol use: Yes    Alcohol/week: 7.0 standard drinks    Types: 7 Glasses of wine per week    Comment: 1 glass of wine every evening   Drug use: No   Sexual activity: Not Currently    Birth control/protection: None  Other Topics Concern   Not on file  Social History Narrative   Not on file   Social Determinants of Health   Financial Resource Strain: Not on file  Food Insecurity: Not on file  Transportation Needs: Not on file  Physical Activity: Not on file  Stress: Not on file  Social Connections: Not on file  Intimate Partner Violence: Not on file    Outpatient Medications Prior to Visit  Medication Sig Dispense Refill   acetaminophen (TYLENOL) 500 MG tablet Take 500 mg by mouth daily as needed for moderate pain or headache.      albuterol (VENTOLIN HFA) 108 (90 Base) MCG/ACT inhaler Inhale 2 puffs into the lungs every 6 (six) hours as needed for wheezing or shortness of breath. 1 each 2   Calcium Citrate-Vitamin D (CALCIUM CITRATE + PO) Take by mouth daily at 12 noon.     clopidogrel (PLAVIX) 75 MG tablet TAKE 1 TABLET BY MOUTH EVERY DAY 90 tablet 1   diazepam (VALIUM) 5 MG tablet Take 1 tablet (5 mg total) by mouth at bedtime as needed for anxiety (sleep). 30 tablet 0   dorzolamide-timolol (COSOPT) 22.3-6.8 MG/ML ophthalmic solution Place into both eyes daily.     isosorbide mononitrate  (IMDUR) 30 MG 24 hr tablet TAKE 1/2 OF A TABLET (15 MG TOTAL) BY MOUTH DAILY 45 tablet 0   lansoprazole (PREVACID) 30 MG capsule Take 30 mg by mouth. One capsule 3 times weekly     levothyroxine (SYNTHROID) 88 MCG tablet Take 88 mcg by mouth daily.     Multiple Vitamin (MULTIVITAMIN) tablet Take 1 tablet by  mouth daily.     rosuvastatin (CRESTOR) 40 MG tablet TAKE 1 TABLET BY MOUTH EVERY DAY 90 tablet 0   No facility-administered medications prior to visit.    Allergies  Allergen Reactions   Neuromuscular Blocking Agents Other (See Comments)    Multiple symptoms, SOB, fatigue, weakness   Atorvastatin Other (See Comments)    itching   Morphine And Related Rash    Review of Systems Review of Systems:  A fourteen system review of systems was performed and found to be positive as per HPI.     Objective:    Physical Exam General:  Well Developed, well nourished, in no acute distress  Neuro:  Alert and oriented,  extra-ocular muscles intact  HEENT:  Normocephalic, atraumatic, neck supple Skin:  no gross rash, warm, pink. Abdomen: Soft, non-distended, tenderness of RUQ and epigastric area, +BS, negative Murphy's and Rovsing's sign, no guarding or rebound tenderness, no CVA tenderness   Cardiac:  RRR, S1 S2 Respiratory:  CTA B/L w/o wheezing, crackles or rales Vascular:  Ext warm, no cyanosis apprec.; cap RF less 2 sec. Psych:  No HI/SI, judgement and insight good, Euthymic mood. Full Affect.  BP 117/65    Pulse (!) 58    Temp (!) 97.2 F (36.2 C)    Ht $R'5\' 2"'cI$  (1.575 m)    Wt 122 lb 12.8 oz (55.7 kg)    SpO2 98%    BMI 22.46 kg/m  Wt Readings from Last 3 Encounters:  02/28/21 122 lb 12.8 oz (55.7 kg)  10/19/20 118 lb (53.5 kg)  09/29/20 119 lb (54 kg)    Health Maintenance Due  Topic Date Due   Hepatitis C Screening  Never done   TETANUS/TDAP  Never done   Zoster Vaccines- Shingrix (1 of 2) Never done   COVID-19 Vaccine (3 - Booster for Pfizer series) 06/24/2019   INFLUENZA  VACCINE  10/10/2020    There are no preventive care reminders to display for this patient.   Lab Results  Component Value Date   TSH 2.110 10/12/2020   Lab Results  Component Value Date   WBC 4.8 10/12/2020   HGB 14.8 10/12/2020   HCT 46.6 10/12/2020   MCV 96 10/12/2020   PLT 236 10/12/2020   Lab Results  Component Value Date   NA 140 10/12/2020   K 4.9 10/12/2020   CO2 25 10/12/2020   GLUCOSE 110 (H) 10/12/2020   BUN 13 10/12/2020   CREATININE 0.80 10/12/2020   BILITOT 0.4 10/12/2020   ALKPHOS 100 10/12/2020   AST 30 10/12/2020   ALT 21 10/12/2020   PROT 7.2 10/12/2020   ALBUMIN 4.9 (H) 10/12/2020   CALCIUM 10.0 10/12/2020   ANIONGAP 8 09/23/2017   EGFR 77 10/12/2020   Lab Results  Component Value Date   CHOL 142 10/12/2020   Lab Results  Component Value Date   HDL 58 10/12/2020   Lab Results  Component Value Date   LDLCALC 70 10/12/2020   Lab Results  Component Value Date   TRIG 72 10/12/2020   Lab Results  Component Value Date   CHOLHDL 2.4 10/12/2020   Lab Results  Component Value Date   HGBA1C 6.0 (H) 10/12/2020       Assessment & Plan:   Problem List Items Addressed This Visit   None Visit Diagnoses     Upper abdominal pain    -  Primary   Relevant Orders   CBC w/Diff   Comp Met (CMET)  Lipase   Nausea and vomiting, unspecified vomiting type       Relevant Orders   CT Abdomen Pelvis W Contrast      Will place order for CT abdomen and pelvis with contrast and labs for further evaluation.  DDX-cholelithiasis/choledocholithiasis, cholecystitis, gastritis, pancreatitis, PUD or nephrolithiasis. Less likely cystitis, patient currently unable to provide urine sample for UA and will return later today to provide sample. Will collect labs to evaluate for leukocytosis, pancreatitis, and liver function. Discussed red flag s/sx to monitor and is aware should seek immediate medical care. Pt deferred antiemetic.    No orders of the defined  types were placed in this encounter.    Lorrene Reid, PA-C

## 2021-03-01 LAB — CBC WITH DIFFERENTIAL/PLATELET
Basophils Absolute: 0 x10E3/uL (ref 0.0–0.2)
Basos: 1 %
EOS (ABSOLUTE): 0.1 x10E3/uL (ref 0.0–0.4)
Eos: 2 %
Hematocrit: 45.4 % (ref 34.0–46.6)
Hemoglobin: 15 g/dL (ref 11.1–15.9)
Immature Grans (Abs): 0 x10E3/uL (ref 0.0–0.1)
Immature Granulocytes: 0 %
Lymphocytes Absolute: 1.7 x10E3/uL (ref 0.7–3.1)
Lymphs: 31 %
MCH: 30.6 pg (ref 26.6–33.0)
MCHC: 33 g/dL (ref 31.5–35.7)
MCV: 93 fL (ref 79–97)
Monocytes Absolute: 0.7 x10E3/uL (ref 0.1–0.9)
Monocytes: 12 %
Neutrophils Absolute: 3 x10E3/uL (ref 1.4–7.0)
Neutrophils: 54 %
Platelets: 232 x10E3/uL (ref 150–450)
RBC: 4.9 x10E6/uL (ref 3.77–5.28)
RDW: 12.2 % (ref 11.7–15.4)
WBC: 5.5 x10E3/uL (ref 3.4–10.8)

## 2021-03-01 LAB — COMPREHENSIVE METABOLIC PANEL
ALT: 23 IU/L (ref 0–32)
AST: 29 IU/L (ref 0–40)
Albumin/Globulin Ratio: 2 (ref 1.2–2.2)
Albumin: 4.8 g/dL — ABNORMAL HIGH (ref 3.7–4.7)
Alkaline Phosphatase: 93 IU/L (ref 44–121)
BUN/Creatinine Ratio: 25 (ref 12–28)
BUN: 19 mg/dL (ref 8–27)
Bilirubin Total: 0.5 mg/dL (ref 0.0–1.2)
CO2: 25 mmol/L (ref 20–29)
Calcium: 9.6 mg/dL (ref 8.7–10.3)
Chloride: 100 mmol/L (ref 96–106)
Creatinine, Ser: 0.77 mg/dL (ref 0.57–1.00)
Globulin, Total: 2.4 g/dL (ref 1.5–4.5)
Glucose: 95 mg/dL (ref 70–99)
Potassium: 5.3 mmol/L — ABNORMAL HIGH (ref 3.5–5.2)
Sodium: 137 mmol/L (ref 134–144)
Total Protein: 7.2 g/dL (ref 6.0–8.5)
eGFR: 80 mL/min/{1.73_m2} (ref >59)

## 2021-03-01 LAB — LIPASE: Lipase: 49 U/L (ref 14–85)

## 2021-03-08 ENCOUNTER — Ambulatory Visit
Admission: RE | Admit: 2021-03-08 | Discharge: 2021-03-08 | Disposition: A | Discharge status: Home / Self Care | Payer: Medicare HMO | Source: Ambulatory Visit | Attending: Physician Assistant | Admitting: Physician Assistant

## 2021-03-08 ENCOUNTER — Other Ambulatory Visit: Payer: Self-pay

## 2021-03-08 DIAGNOSIS — M4316 Spondylolisthesis, lumbar region: Secondary | ICD-10-CM | POA: Diagnosis not present

## 2021-03-08 DIAGNOSIS — R1010 Upper abdominal pain, unspecified: Secondary | ICD-10-CM

## 2021-03-08 DIAGNOSIS — R112 Nausea with vomiting, unspecified: Secondary | ICD-10-CM

## 2021-03-08 DIAGNOSIS — M160 Bilateral primary osteoarthritis of hip: Secondary | ICD-10-CM | POA: Diagnosis not present

## 2021-03-08 DIAGNOSIS — K3189 Other diseases of stomach and duodenum: Secondary | ICD-10-CM | POA: Diagnosis not present

## 2021-03-08 DIAGNOSIS — M47816 Spondylosis without myelopathy or radiculopathy, lumbar region: Secondary | ICD-10-CM | POA: Diagnosis not present

## 2021-03-08 MED ORDER — IOPAMIDOL (ISOVUE-300) INJECTION 61%
100.0000 mL | Freq: Once | INTRAVENOUS | Status: AC | PRN
  Administered 2021-03-08: 12:00:00 100 mL via INTRAVENOUS

## 2021-03-28 NOTE — Progress Notes (Signed)
Follow-up Outpatient Visit Date: 03/29/2021  Primary Care Provider: Lorrene Reid, PA-C [place] [address]  Chief Complaint: Follow-up coronary artery disease  HPI:  [name] is a 76 y.o. female with history of nonobstructive coronary artery disease, peripheral vascular disease with right vertebral artery occlusion and left subclavian artery stenosis status post stenting (12/2016-followed by Dr. Trula Slade), stroke (2016), hyperlipidemia, hypothyroidism, and GERD, who presents for follow-up of coronary artery disease.  I last saw her in July, at which time she was gradually recovering from COVID-19 infection.  She noted some chest tightness with coughing but was otherwise chest pain-free.  We did not make any medication changes or pursue additional testing at that time.  She is scheduled for annual follow-up with vascular surgery next month.  Today, [name] reports that she has been feeling well from a heart standpoint.  She notes mild exertional dyspnea with strenuous activity but attributes some of this to to being deconditioned.  She hopes to begin exercising more.  She denies chest pain.  She has been dealing with some GI issues, particularly right upper quadrant pain/pressure.  CTA showed possible findings of gastritis but no significant hepatobiliary abnormality.  She is scheduled for follow-up with her gastroenterologist.  Ms. Bhullar denies new neurologic changes.  She still has some intermittent aching and vague weakness in the right arm, especially when pushing things.  This has been present for years and is stable.  She does not have any left arm pain/weakness.  She denies palpitations and edema.  She notes 1 episode of low blood pressure, down to 98/60, in the setting of feeling lethargic.  --------------------------------------------------------------------------------------------------  Cardiovascular History & Procedures: Cardiovascular  Problems: Peripheral vascular disease Stroke Stable angina with nonobstructive coronary artery disease   Risk Factors: Peripheral and cerebrovascular disease, hyperlipidemia, and age greater than 75   Cath/PCI: LHC (04/19/17): LMCA normal. LAD with sequential 50% and 25% lesions (FFR 0.92). Ramus with minimal disease. LCx normal. RCA with mild diffuse disease. LVEDP 17 mmHg. LVEF 55-65%.   CV Surgery: Aortography and left subclavian angiography; intervention (01/01/17, Dr. Trula Slade): Occluded right vertebral artery. 80% left subclavian artery stenosis with successful intervention with Xpress 8 x 25 mm balloon-expandable stent.   EP Procedures and Devices: 30-day event monitor (11/28/16): Sinus rhythm. No significant arrhythmia.   Non-Invasive Evaluation(s): Carotid Doppler (04/04/2020): 1-39% stenosis in both internal carotid arteries.  Abselen right vertebral artery flow.  Normal bilateral subclavian artery flow hemodynamics. Cardiac CTA (12/17/2019): LMCA normal.  LAD with calcified plaque and 25-49% stenosis involving the proximal and mid segments.  Ramus intermedius with less than 25% calcified stenosis of the mid vessel.  LCx with less than 25% stenosis involving the mid vessel.  Normal RCA.  Coronary calcium score 302 (79th percentile).  Incidental note made of clustered area of tree-in-bud nodularity in the superior segment of the left lower lobe.  Findings most likely reflect inflammatory/infectious bronchiolitis. Carotid Doppler (02/17/18): 1-39% stenosis of both internal carotid arteries.  Right vertebral artery is occluded.  Left vertebral artery shows antegrade flow.  No significant subclavian artery stenosis bilaterally. Exercise MPI (11/22/16): Low risk study with small in size, mild in severity, fixed apical anterior and apical defect likely representing apical thinning. LVEF >65% with normal wall motion. Patient complained of fatigue during test, exercising 5:30 minutes. Upper extremity  arterial Doppler (10/26/16): Right upper extremity evaluation normal. Left upper extremity demonstrates dampened biphasic to monophasic waveforms and 20 mmHg pressure difference compared  to the right. Carotid artery duplex (10/18/16): Bilateral 1-39% stenoses involving the right and left internal carotid arteries. Transthoracic echocardiogram (12/28/14): Normal LV size and function. LVEF 60-65% with grade 1 diastolic dysfunction. Normal RV size and function. No significant vascular abnormalities.  Recent CV Pertinent Labs: Lab Results  Component Value Date   CHOL 142 10/12/2020   HDL 58 10/12/2020   LDLCALC 70 10/12/2020   TRIG 72 10/12/2020   CHOLHDL 2.4 10/12/2020   CHOLHDL 3.9 12/27/2014   INR 1.0 04/12/2017   K 5.3 (H) 02/28/2021   BUN 19 02/28/2021   CREATININE 0.77 02/28/2021    Past medical and surgical history were reviewed and updated in EPIC.  No outpatient medications have been marked as taking for the 03/29/21 encounter (Appointment) with Handsome Anglin, Harrell Gave, MD.    Allergies: Neuromuscular blocking agents, Atorvastatin, and Morphine and related  Social History   Tobacco Use   Smoking status: Former    Packs/day: 0.25    Types: Cigarettes    Quit date: 03/12/1978    Years since quitting: 43.0   Smokeless tobacco: Never   Tobacco comments:    quit in 1978  Vaping Use   Vaping Use: Never used  Substance Use Topics   Alcohol use: Yes    Alcohol/week: 7.0 standard drinks    Types: 7 Glasses of wine per week    Comment: 1 glass of wine every evening   Drug use: No    Family History  Problem Relation Age of Onset   Heart failure Mother 84   Heart murmur Mother    Other Father 43       SEPSIS   Hypertension Father    Stroke Father 47       CVA   Heart attack Sister 18   Breast cancer Sister 44   Stroke Sister    Hypertension Sister        x 2   Diabetes Sister    Diabetes Brother    Cirrhosis Brother    Breast cancer Maternal Aunt    Breast cancer Paternal  Aunt    Heart murmur Daughter    Breast cancer Cousin    Breast cancer Cousin    Breast cancer Cousin    Breast cancer Cousin     Review of Systems: A 12-system review of systems was performed and was negative except as noted in the HPI.  --------------------------------------------------------------------------------------------------  Physical Exam: There were no vitals taken for this visit.  General:  NAD. Neck: No JVD or HJR. Lungs: Clear to auscultation bilaterally without wheezes or crackles. Heart: Regular rate and rhythm without murmurs, rubs, or gallops. Abdomen: Soft, nontender, nondistended. Extremities: No lower extremity edema.  EKG: Normal sinus rhythm with anteroseptal infarct and nonspecific ST changes.  No significant change from 09/29/2020.  Lab Results  Component Value Date   WBC 5.5 02/28/2021   HGB 15.0 02/28/2021   HCT 45.4 02/28/2021   MCV 93 02/28/2021   PLT 232 02/28/2021    Lab Results  Component Value Date   NA 137 02/28/2021   K 5.3 (H) 02/28/2021   CL 100 02/28/2021   CO2 25 02/28/2021   BUN 19 02/28/2021   CREATININE 0.77 02/28/2021   GLUCOSE 95 02/28/2021   ALT 23 02/28/2021    Lab Results  Component Value Date   CHOL 142 10/12/2020   HDL 58 10/12/2020   LDLCALC 70 10/12/2020   TRIG 72 10/12/2020   CHOLHDL 2.4 10/12/2020    --------------------------------------------------------------------------------------------------  ASSESSMENT AND PLAN: Coronary artery disease with stable angina: [name] continues to do well from a heart standpoint without recurrent angina in the setting of moderate LAD disease by catheterization in 2019.  We will continue her antianginal regimen consisting of low-dose isosorbide mononitrate as well as long-term antiplatelet therapy with clopidogrel.  Subclavian stenosis: No symptoms to suggest restenosis of left subclavian artery stent.  Continue aggressive secondary prevention and annual follow-up  with Dr. Trula Slade.  Hyperlipidemia: LDL near goal at 70 on last check in August.  Continue lifestyle modifications and rosuvastatin 40 mg daily.  Follow-up: Return to clinic in 1 year.  Nelva Bush, MD 03/28/2021 5:01 PM

## 2021-03-29 ENCOUNTER — Ambulatory Visit: Payer: Medicare HMO | Admitting: Internal Medicine

## 2021-03-29 ENCOUNTER — Other Ambulatory Visit: Payer: Self-pay

## 2021-03-29 ENCOUNTER — Encounter: Payer: Self-pay | Admitting: Internal Medicine

## 2021-03-29 VITALS — BP 120/70 | HR 78 | Ht 62.0 in | Wt 121.0 lb

## 2021-03-29 DIAGNOSIS — I25118 Atherosclerotic heart disease of native coronary artery with other forms of angina pectoris: Secondary | ICD-10-CM | POA: Diagnosis not present

## 2021-03-29 DIAGNOSIS — E785 Hyperlipidemia, unspecified: Secondary | ICD-10-CM | POA: Diagnosis not present

## 2021-03-29 DIAGNOSIS — I771 Stricture of artery: Secondary | ICD-10-CM

## 2021-03-29 NOTE — Patient Instructions (Signed)
Medication Instructions:   Your physician recommends that you continue on your current medications as directed. Please refer to the Current Medication list given to you today.  *If you need a refill on your cardiac medications before your next appointment, please call your pharmacy*   Lab Work:  None ordered  Testing/Procedures:  None ordered   Follow-Up: At [facility], you and your health needs are our priority.  As part of our continuing mission to provide you with exceptional heart care, we have created designated Provider Care Teams.  These Care Teams include your primary Cardiologist (physician) and Advanced Practice Providers (APPs -  Physician Assistants and Nurse Practitioners) who all work together to provide you with the care you need, when you need it.  We recommend signing up for the patient portal called "MyChart".  Sign up information is provided on this After Visit Summary.  MyChart is used to connect with patients for Virtual Visits (Telemedicine).  Patients are able to view lab/test results, encounter notes, upcoming appointments, etc.  Non-urgent messages can be sent to your provider as well.   To learn more about what you can do with MyChart, go to [email or web address].    Your next appointment:   1 year(s)  The format for your next appointment:   In Person  Provider:   You may see Nelva Bush, MD or one of the following Advanced Practice Providers on your designated Care Team:   Murray Hodgkins, NP Christell Faith, PA-C Cadence Kathlen Mody, [place]

## 2021-04-03 DIAGNOSIS — M8580 Other specified disorders of bone density and structure, unspecified site: Secondary | ICD-10-CM | POA: Diagnosis not present

## 2021-04-03 DIAGNOSIS — R5383 Other fatigue: Secondary | ICD-10-CM | POA: Diagnosis not present

## 2021-04-03 DIAGNOSIS — E890 Postprocedural hypothyroidism: Secondary | ICD-10-CM | POA: Diagnosis not present

## 2021-04-04 ENCOUNTER — Other Ambulatory Visit: Payer: Self-pay

## 2021-04-04 DIAGNOSIS — H401133 Primary open-angle glaucoma, bilateral, severe stage: Secondary | ICD-10-CM | POA: Diagnosis not present

## 2021-04-04 DIAGNOSIS — I6529 Occlusion and stenosis of unspecified carotid artery: Secondary | ICD-10-CM

## 2021-04-05 ENCOUNTER — Other Ambulatory Visit: Payer: Self-pay | Admitting: Internal Medicine

## 2021-04-06 DIAGNOSIS — M8580 Other specified disorders of bone density and structure, unspecified site: Secondary | ICD-10-CM | POA: Diagnosis not present

## 2021-04-06 DIAGNOSIS — R7309 Other abnormal glucose: Secondary | ICD-10-CM | POA: Diagnosis not present

## 2021-04-06 DIAGNOSIS — Z780 Asymptomatic menopausal state: Secondary | ICD-10-CM | POA: Diagnosis not present

## 2021-04-06 DIAGNOSIS — E890 Postprocedural hypothyroidism: Secondary | ICD-10-CM | POA: Diagnosis not present

## 2021-04-11 ENCOUNTER — Other Ambulatory Visit: Payer: Self-pay | Admitting: Endocrinology

## 2021-04-11 DIAGNOSIS — M8580 Other specified disorders of bone density and structure, unspecified site: Secondary | ICD-10-CM

## 2021-04-12 ENCOUNTER — Ambulatory Visit: Payer: Medicare HMO | Admitting: Gastroenterology

## 2021-04-12 ENCOUNTER — Telehealth: Payer: Self-pay | Admitting: *Deleted

## 2021-04-12 ENCOUNTER — Encounter: Payer: Self-pay | Admitting: Gastroenterology

## 2021-04-12 VITALS — BP 102/64 | HR 68 | Ht 62.0 in | Wt 123.0 lb

## 2021-04-12 DIAGNOSIS — R933 Abnormal findings on diagnostic imaging of other parts of digestive tract: Secondary | ICD-10-CM | POA: Diagnosis not present

## 2021-04-12 DIAGNOSIS — R1011 Right upper quadrant pain: Secondary | ICD-10-CM

## 2021-04-12 DIAGNOSIS — K5904 Chronic idiopathic constipation: Secondary | ICD-10-CM

## 2021-04-12 DIAGNOSIS — R1013 Epigastric pain: Secondary | ICD-10-CM | POA: Diagnosis not present

## 2021-04-12 DIAGNOSIS — K219 Gastro-esophageal reflux disease without esophagitis: Secondary | ICD-10-CM | POA: Diagnosis not present

## 2021-04-12 DIAGNOSIS — Z8619 Personal history of other infectious and parasitic diseases: Secondary | ICD-10-CM | POA: Diagnosis not present

## 2021-04-12 NOTE — Telephone Encounter (Signed)
[facility] Pre-operative Risk Assessment     Request for surgical clearance:     Endoscopy Procedure  What type of surgery is being performed?     Endoscopy  When is this surgery scheduled?     04/19/2021  What type of clearance is required ?   Pharmacy  Are there any medications that need to be held prior to surgery and how long? 5 days  Practice name and name of physician performing surgery?      [facility]  What is your office phone and fax number?      Phone- [phone]  [phone]  Anesthesia type (None, local, MAC, general) ?       MAC

## 2021-04-12 NOTE — Telephone Encounter (Signed)
See office by Christell Faith PA-C on 05/09/2018, patient is not on plavix due to cardiac reason. She had left subclavian artery stenosis s/p stending in 12/2016. Previous cardiac cath in Feb 2019 showed nonobstructive CAD. Plavix is currently prescribed by PCP.   Please check either with vascular surgery or PCP regarding whether the patient can come off of plavix

## 2021-04-12 NOTE — Patient Instructions (Signed)
You have been scheduled for an endoscopy. Please follow written instructions given to you at your visit today. If you use inhalers (even only as needed), please bring them with you on the day of your procedure.   Use Stool softener (Docusate) daily as needed  Drink 8 cups of water daily  Follow up with Pulmonary to discuss CT findings  You will be contacted by our office prior to your procedure for directions on holding your Plavix.  If you do not hear from our office 1 week prior to your scheduled procedure, please call [phone] to discuss.    Due to recent changes in healthcare laws, you may see the results of your imaging and laboratory studies on MyChart before your provider has had a chance to review them.  We understand that in some cases there may be results that are confusing or concerning to you. Not all laboratory results come back in the same time frame and the provider may be waiting for multiple results in order to interpret others.  Please give [place] 48 hours in order for your provider to thoroughly review all the results before contacting the office for clarification of your results.    If you are age 32 or older, your body mass index should be between 23-30. Your Body mass index is 22.5 kg/m. If this is out of the aforementioned range listed, please consider follow up with your Primary Care Provider.  If you are age 63 or younger, your body mass index should be between 19-25. Your Body mass index is 22.5 kg/m. If this is out of the aformentioned range listed, please consider follow up with your Primary Care Provider.   ________________________________________________________  The [facility] providers would like to encourage you to use [facility] to communicate with providers for non-urgent requests or questions.  Due to long hold times on the telephone, sending your provider a message by [facility] may be a faster and more efficient way to get a response.  Please allow 48 business  hours for a response.  Please remember that this is for non-urgent requests.  _______________________________________________________   I appreciate the  opportunity to care for you  Thank You   Harl Bowie , MD

## 2021-04-12 NOTE — Progress Notes (Signed)
[name]    [id number]    [birth year]  Primary Care Physician:Abonza, Samantha Crimes  Referring Physician: Lorrene Reid, PA-C [place],  [place] [address]   Chief complaint:  RUQ  abdominal pain and bloating  HPI:  Patient is 76 year old very pleasant female, previously followed by Dr. Olevia Perches with history diverticulitis s/p sigmoid segmental resection, chronic GERD here for evaluation of abnormal  CT abd & pelvis findings with gastric antral wall thickening  She c/o RUQ abd ominal discomfort and bloating.   She is on chronic Plavix for h/o CVA in 2016  Last EGD in 2016, was treated for positive H.pylori  Denies any melena or rectal bleeding   CT abdomen pelvis with contrast March 08, 2021 1. Mild wall thickening of the gastric antrum may represent gastritis. 2. Moderate volume of formed stool throughout the colon may reflect constipation. 3. Tree in bud nodular foci in the lingula likely infectious or inflammatory. 4. Diffuse hepatic steatosis. 5.  Aortic Atherosclerosis (ICD10-I70.0).   Colonoscopy 08/10/2016 Two 2 to 3 mm polyps (hyperplastic/lymphoid follicle) in the ascending colon, removed with a cold biopsy forceps. Resected and retrieved. - Diverticulosis in the descending colon and in the ascending colon. - Patent end-to-side colo-colonic anastomosis, characterized by erythema.   Outpatient Encounter Medications as of 04/12/2021  Medication Sig   acetaminophen (TYLENOL) 500 MG tablet Take 500 mg by mouth daily as needed for moderate pain or headache.    Calcium Citrate-Vitamin D (CALCIUM CITRATE + PO) Take by mouth daily at 12 noon.   clopidogrel (PLAVIX) 75 MG tablet TAKE 1 TABLET BY MOUTH EVERY DAY   diazepam (VALIUM) 5 MG tablet Take 1 tablet (5 mg total) by mouth at bedtime as needed for anxiety (sleep).   dorzolamide-timolol (COSOPT) 22.3-6.8 MG/ML ophthalmic solution Place into both eyes daily.   isosorbide  mononitrate (IMDUR) 30 MG 24 hr tablet TAKE 1/2 OF A TABLET (15 MG TOTAL) BY MOUTH DAILY   lansoprazole (PREVACID) 30 MG capsule Take 30 mg by mouth. One capsule 3 times weekly   levothyroxine (SYNTHROID) 88 MCG tablet Take 88 mcg by mouth daily.   Multiple Vitamin (MULTIVITAMIN) tablet Take 1 tablet by mouth daily.   rosuvastatin (CRESTOR) 40 MG tablet TAKE 1 TABLET BY MOUTH EVERY DAY   No facility-administered encounter medications on file as of 04/12/2021.    Allergies as of 04/12/2021 - Review Complete 04/12/2021  Allergen Reaction Noted   Neuromuscular blocking agents Other (See Comments) 12/26/2014   Atorvastatin Other (See Comments) 08/15/2020   Morphine and related Rash 05/26/2018    Past Medical History:  Diagnosis Date   Anxiety    Barrett esophagus 2003   Diverticulitis    Diverticulosis    GERD (gastroesophageal reflux disease)    Glaucoma    Grave's disease    H. pylori infection    Hepatitis A    in her 34 from food   Hyperlipidemia    Hypothyroidism    Increased pressure in the eye    Insomnia    Morton's neuroma    Murmur    Scoliosis    Stroke ([place]) 12/2014   no deficits    Past Surgical History:  Procedure Laterality Date   AORTIC ARCH ANGIOGRAPHY N/A 01/01/2017   Procedure: AORTIC ARCH ANGIOGRAPHY;  Surgeon: Serafina Mitchell, MD;  Location: [place];  Service: Cardiovascular;  Laterality: N/A;   APPENDECTOMY  1958   CATARACT EXTRACTION Left    CATARACT EXTRACTION  02/12/2018   CATARACT EXTRACTION W/PHACO Left 07/02/2016   Procedure: CATARACT EXTRACTION PHACO AND INTRAOCULAR LENS PLACEMENT ([place])  Left;  Surgeon: Ronnell Freshwater, MD;  Location: [place] [facility];  Service: Ophthalmology;  Laterality: Left;   CATARACT EXTRACTION W/PHACO Right 02/12/2018   Procedure: CATARACT EXTRACTION PHACO AND INTRAOCULAR LENS PLACEMENT ([place])  RIGHT;  Surgeon: Leandrew Koyanagi, MD;  Location: [place];  Service: Ophthalmology;   Laterality: Right;   CERVICAL LAMINECTOMY  1995   COLON SURGERY     EYE SURGERY     due to graves disease   FEMUR SURGERY Right    benign bone growth   FOOT NEUROMA SURGERY Right    INTRAVASCULAR PRESSURE WIRE/FFR STUDY N/A 04/19/2017   Procedure: INTRAVASCULAR PRESSURE WIRE/FFR STUDY;  Surgeon: Nelva Bush, MD;  Location: [facility];  Service: Cardiovascular;  Laterality: N/A;   LAPAROSCOPIC ASSISTED VAGINAL HYSTERECTOMY     LAPAROSCOPIC SIGMOID COLECTOMY  05/13/06   Dr Zella Richer   LEFT HEART CATH AND CORONARY ANGIOGRAPHY N/A 04/19/2017   Procedure: LEFT HEART CATH AND CORONARY ANGIOGRAPHY;  Surgeon: Nelva Bush, MD;  Location: [place] [facility];  Service: Cardiovascular;  Laterality: N/A;   LUMBAR DISC SURGERY  2009   PERIPHERAL VASCULAR INTERVENTION Left 01/01/2017   Procedure: PERIPHERAL VASCULAR INTERVENTION;  Surgeon: Serafina Mitchell, MD;  Location: [place] [facility];  Service: Cardiovascular;  Laterality: Left;   SUBCLAVIAN STENT PLACEMENT     TONSILLECTOMY  1992   TRIGGER FINGER RELEASE Right 05/26/2018   Procedure: RIGHT INDEX RELEASE TRIGGER FINGER/A-1 PULLEY;  Surgeon: [name], MD;  Location: [place];  Service: Orthopedics;  Laterality: Right;   TUBAL LIGATION     UPPER EXTREMITY ANGIOGRAPHY Left 01/01/2017   Procedure: Upper Extremity Angiography;  Surgeon: Serafina Mitchell, MD;  Location: [facility] [facility];  Service: Cardiovascular;  Laterality: Left;    Family History  Problem Relation Age of Onset   Heart failure Mother 70   Heart murmur Mother    Other Father 76       SEPSIS   Hypertension Father    Stroke Father 50       CVA   Diabetes Sister    Heart failure Sister    Breast cancer Sister    Stroke Sister    Heart attack Sister 62   Hypertension Sister    Diabetes Brother    Hypertension Brother    Heart disease Brother    Cirrhosis Brother    Breast cancer Maternal Aunt    Breast cancer Paternal Aunt    Heart  murmur Daughter    Breast cancer Cousin    Breast cancer Cousin    Breast cancer Cousin    Breast cancer Cousin     Social History   Socioeconomic History   Marital status: Married    Spouse name: Not on file   Number of children: 1   Years of education: Not on file   Highest education level: Not on file  Occupational History   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 [employer]  Tobacco Use   Smoking status: Former    Packs/day: 0.25    Types: Cigarettes    Quit date: 03/12/1978    Years since quitting: 43.1   Smokeless tobacco: Never   Tobacco comments:    quit in [place] Use  Vaping Use: Never used  Substance and Sexual Activity   Alcohol use: Yes    Alcohol/week: 7.0 standard drinks    Types: 7 Glasses of wine per week    Comment: 1 glass of wine every evening   Drug use: No   Sexual activity: Not Currently    Birth control/protection: None  Other Topics Concern   Not on file  Social History Narrative   Not on file   Social Determinants of Health   Financial Resource Strain: Not on file  Food Insecurity: Not on file  Transportation Needs: Not on file  Physical Activity: Not on file  Stress: Not on file  Social Connections: Not on file  Intimate Partner Violence: Not on file      Review of systems: All other review of systems negative except as mentioned in the HPI.   Physical Exam: Vitals:   04/12/21 1014  BP: 102/64  Pulse: 68  SpO2: 98%   Body mass index is 22.5 kg/m. Gen:      No acute distress HEENT:  sclera anicteric Abd:      soft, non-tender; no palpable masses, no distension Ext:    No edema Neuro: alert and oriented x 3 Psych: normal mood and affect  Data Reviewed:  Reviewed labs, radiology imaging, old records and pertinent past GI work up   Assessment and Plan/Recommendations:  76 yr old very pleasant female, chronic GERD, H.pylori gastritis s/p therapy here for evaluation of abnormal CT stomach  findings with gastric antral wall thickening.  Will plan to proceed with EGD to further evaluate and obtain biopsies if needed  She is on chronic plavix for h/o CVA, will request clearance to hold Plavix, if not plan to proceed with patient on Plavix The risks and benefits as well as alternatives of endoscopic procedure(s) have been discussed and reviewed. All questions answered. The patient agrees to proceed.   H/o diverticulosis, perforated sigmoid diverticulitis s/p segmental resection Chronic idiopathic constipation: Use daily stool softener as needed Increase dietary fiber and fluid intake  Pulmonary tree in bud nodular foci: follow up with pulmonary for further evaluation and management if required   The patient was provided an opportunity to ask questions and all were answered. The patient agreed with the plan and demonstrated an understanding of the instructions.  Damaris Hippo , MD    CC: Lorrene Reid, PA-C

## 2021-04-17 ENCOUNTER — Ambulatory Visit (HOSPITAL_COMMUNITY)
Admission: RE | Admit: 2021-04-17 | Discharge: 2021-04-17 | Disposition: A | Discharge status: Home / Self Care | Payer: Medicare HMO | Source: Ambulatory Visit | Attending: Surgery | Admitting: Surgery

## 2021-04-17 ENCOUNTER — Ambulatory Visit: Payer: Medicare HMO | Admitting: Physician Assistant

## 2021-04-17 ENCOUNTER — Other Ambulatory Visit: Payer: Self-pay

## 2021-04-17 VITALS — BP 134/76 | HR 72 | Temp 97.7°F | Resp 20 | Ht 62.0 in | Wt 123.3 lb

## 2021-04-17 DIAGNOSIS — N182 Chronic kidney disease, stage 2 (mild): Secondary | ICD-10-CM | POA: Insufficient documentation

## 2021-04-17 DIAGNOSIS — I771 Stricture of artery: Secondary | ICD-10-CM

## 2021-04-17 DIAGNOSIS — I6529 Occlusion and stenosis of unspecified carotid artery: Secondary | ICD-10-CM | POA: Diagnosis not present

## 2021-04-17 DIAGNOSIS — M8580 Other specified disorders of bone density and structure, unspecified site: Secondary | ICD-10-CM | POA: Insufficient documentation

## 2021-04-17 DIAGNOSIS — E890 Postprocedural hypothyroidism: Secondary | ICD-10-CM | POA: Insufficient documentation

## 2021-04-17 DIAGNOSIS — Z720 Tobacco use: Secondary | ICD-10-CM | POA: Insufficient documentation

## 2021-04-17 NOTE — Telephone Encounter (Signed)
Patient can hold Plavix 5 days prior to endoscopy and resume one day after.   Thank you,  Maritza         Dr Silverio Decamp pts procedure is Wed  We will have to cancel?

## 2021-04-17 NOTE — Progress Notes (Signed)
HISTORY AND PHYSICAL     CC:  follow up. Requesting Provider:  Lorrene Reid, PA-C  HPI: This is a 76 y.o. female here for follow up.  She was initially seen by Dr. Trula Slade in 2018 for asymmetric blood pressures.   She did undergo stenting of the left subclavian artery on 03/03/2017 by Dr. Trula Slade.  Pt has hx of stroke in October 2016 secondary to known right vertebral artery occlusion.     Pt was last seen 04/02/2019 and at that time she was doing well without stroke sx.    Pt returns today for follow up.    Pt denies any amaurosis fugax, speech difficulties, numbness, paralysis or clumsiness or facial droop.  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  She feels this is not due to a vascular problem.  She states that it could be related to the way she sleeps on it at times.  She states that she does have glaucoma and macular degeneration.  She is currently off of her Plavix as she will be undergoing an endoscopy.    The pt is on a statin for cholesterol management.  The pt is not on a daily aspirin.   Other AC:  Plavix The pt is not on medication for hypertension.   The pt is not diabetic.   Tobacco hx:  former  Pt does not have family hx of AAA.  Past Medical History:  Diagnosis Date   Anxiety    Barrett esophagus 2003   Diverticulitis    Diverticulosis    GERD (gastroesophageal reflux disease)    Glaucoma    Grave's disease    H. pylori infection    Hepatitis A    in her 9 from food   Hyperlipidemia    Hypothyroidism    Increased pressure in the eye    Insomnia    Morton's neuroma    Murmur    Scoliosis    Stroke ([place]) 12/2014   no deficits    Past Surgical History:  Procedure Laterality Date   AORTIC ARCH ANGIOGRAPHY N/A 01/01/2017   Procedure: AORTIC ARCH ANGIOGRAPHY;  Surgeon: Serafina Mitchell, MD;  Location: [facility];  Service: Cardiovascular;  Laterality:  N/A;   APPENDECTOMY  1958   CATARACT EXTRACTION Left    CATARACT EXTRACTION  02/12/2018   CATARACT EXTRACTION W/PHACO Left 07/02/2016   Procedure: CATARACT EXTRACTION PHACO AND INTRAOCULAR LENS PLACEMENT ([place])  Left;  Surgeon: Ronnell Freshwater, MD;  Location: [facility];  Service: Ophthalmology;  Laterality: Left;   CATARACT EXTRACTION W/PHACO Right 02/12/2018   Procedure: CATARACT EXTRACTION PHACO AND INTRAOCULAR LENS PLACEMENT ([place])  RIGHT;  Surgeon: Leandrew Koyanagi, MD;  Location: [facility];  Service: Ophthalmology;  Laterality: Right;   CERVICAL LAMINECTOMY  1995   COLON SURGERY     EYE SURGERY     due to graves disease   FEMUR SURGERY Right    benign bone growth   FOOT NEUROMA SURGERY Right    INTRAVASCULAR PRESSURE WIRE/FFR STUDY N/A 04/19/2017   Procedure: INTRAVASCULAR PRESSURE WIRE/FFR STUDY;  Surgeon: Nelva Bush, MD;  Location: [facility];  Service: Cardiovascular;  Laterality: N/A;   LAPAROSCOPIC ASSISTED VAGINAL HYSTERECTOMY     LAPAROSCOPIC SIGMOID COLECTOMY  05/13/06   Dr Zella Richer   LEFT HEART CATH AND CORONARY ANGIOGRAPHY N/A 04/19/2017   Procedure: LEFT HEART CATH AND CORONARY  ANGIOGRAPHY;  Surgeon: Nelva Bush, MD;  Location: [facility];  Service: Cardiovascular;  Laterality: N/A;   LUMBAR DISC SURGERY  2009   PERIPHERAL VASCULAR INTERVENTION Left 01/01/2017   Procedure: PERIPHERAL VASCULAR INTERVENTION;  Surgeon: Serafina Mitchell, MD;  Location: [facility];  Service: Cardiovascular;  Laterality: Left;   SUBCLAVIAN STENT PLACEMENT     TONSILLECTOMY  1992   TRIGGER FINGER RELEASE Right 05/26/2018   Procedure: RIGHT INDEX RELEASE TRIGGER FINGER/A-1 PULLEY;  Surgeon: [name], MD;  Location: [facility];  Service: Orthopedics;  Laterality: Right;   TUBAL LIGATION     UPPER EXTREMITY ANGIOGRAPHY Left 01/01/2017   Procedure: Upper Extremity Angiography;  Surgeon: Serafina Mitchell, MD;  Location:  [facility];  Service: Cardiovascular;  Laterality: Left;    Allergies  Allergen Reactions   Neuromuscular Blocking Agents Other (See Comments)    Multiple symptoms, SOB, fatigue, weakness   Atorvastatin Other (See Comments)    itching   Morphine And Related Rash    Current Outpatient Medications  Medication Sig Dispense Refill   acetaminophen (TYLENOL) 500 MG tablet Take 500 mg by mouth daily as needed for moderate pain or headache.      Calcium Citrate-Vitamin D (CALCIUM CITRATE + PO) Take by mouth daily at 12 noon.     clopidogrel (PLAVIX) 75 MG tablet TAKE 1 TABLET BY MOUTH EVERY DAY 90 tablet 1   diazepam (VALIUM) 5 MG tablet Take 1 tablet (5 mg total) by mouth at bedtime as needed for anxiety (sleep). 30 tablet 0   dorzolamide-timolol (COSOPT) 22.3-6.8 MG/ML ophthalmic solution Place into both eyes daily.     isosorbide mononitrate (IMDUR) 30 MG 24 hr tablet TAKE 1/2 OF A TABLET (15 MG TOTAL) BY MOUTH DAILY 45 tablet 0   latanoprost (XALATAN) 0.005 % ophthalmic solution 1 drop at bedtime.     Lutein 6 MG TABS      Multiple Vitamin (MULTIVITAMIN) tablet Take 1 tablet by mouth daily.     rosuvastatin (CRESTOR) 40 MG tablet TAKE 1 TABLET BY MOUTH EVERY DAY 90 tablet 0   thyroid (ARMOUR) 60 MG tablet 1 tablet on an empty stomach     No current facility-administered medications for this visit.    Family History  Problem Relation Age of Onset   Heart failure Mother 69   Heart murmur Mother    Other Father 60       SEPSIS   Hypertension Father    Stroke Father 21       CVA   Diabetes Sister    Heart failure Sister    Breast cancer Sister    Stroke Sister    Heart attack Sister 62   Hypertension Sister    Diabetes Brother    Hypertension Brother    Heart disease Brother    Cirrhosis Brother    Heart murmur Daughter    Breast cancer Maternal Aunt    Breast cancer Paternal Aunt    Breast cancer Cousin    Breast cancer Cousin    Breast cancer Cousin    Breast  cancer Cousin    Stomach cancer Neg Hx    Colon cancer Neg Hx    Esophageal cancer Neg Hx    Pancreatic cancer Neg Hx     Social History   Socioeconomic History   Marital status: Married    Spouse name: Not on file   Number of children: 1   Years of  education: Not on file   Highest education level: Not on file  Occupational History   Occupation: Merchant navy officer    Comment: @ [employer] on [employer]  Tobacco Use   Smoking status: Former    Packs/day: 0.25    Types: Cigarettes    Quit date: 03/12/1978    Years since quitting: 43.1   Smokeless tobacco: Never   Tobacco comments:    quit in 1978  Vaping Use   Vaping Use: Never used  Substance and Sexual Activity   Alcohol use: Yes    Alcohol/week: 7.0 standard drinks    Types: 7 Glasses of wine per week    Comment: 1 glass of wine every evening   Drug use: No   Sexual activity: Not Currently    Birth control/protection: None  Other Topics Concern   Not on file  Social History Narrative   Not on file   Social Determinants of Health   Financial Resource Strain: Not on file  Food Insecurity: Not on file  Transportation Needs: Not on file  Physical Activity: Not on file  Stress: Not on file  Social Connections: Not on file  Intimate Partner Violence: Not on file     REVIEW OF SYSTEMS:   [X]  denotes positive finding, [ ]  denotes negative finding Cardiac  Comments:  Chest pain or chest pressure:    Shortness of breath upon exertion:    Short of breath when lying flat:    Irregular heart rhythm:        Vascular    Pain in calf, thigh, or hip brought on by ambulation:    Pain in feet at night that wakes you up from your sleep:     Blood clot in your veins:    Leg swelling:         Pulmonary    Oxygen at home:    Productive cough:     Wheezing:         Neurologic    Sudden weakness in arms or legs:     Sudden numbness in arms or legs:     Sudden onset of difficulty speaking or slurred speech:     Temporary loss of vision in one eye:     Problems with dizziness:         Gastrointestinal    Blood in stool:     Vomited blood:         Genitourinary    Burning when urinating:     Blood in urine:        Psychiatric    Major depression:         Hematologic    Bleeding problems:    Problems with blood clotting too easily:        Skin    Rashes or ulcers:        Constitutional    Fever or chills:      PHYSICAL EXAMINATION:  Today's Vitals   04/17/21 1431 04/17/21 1432  BP: 132/77 134/76  Pulse: 72   Resp: 20   Temp: 97.7 F (36.5 C)   TempSrc: Temporal   SpO2: 98%   Weight: 123 lb 4.8 oz (55.9 kg)   Height: 5\' 2"  (1.575 m)    Body mass index is 22.55 kg/m.   General:  WDWN in NAD; vital signs documented above Gait: Not observed HENT: WNL, normocephalic Pulmonary: normal non-labored breathing Cardiac: regular HR, without carotid bruits Abdomen: soft, NT; aortic pulse is not palpable  Skin: without rashes Vascular Exam/Pulses:  Right Left  Radial 2+ (normal) 2+ (normal)  PT 2+ (normal) 2+ (normal)   Extremities: without ischemic changes, without Gangrene , without cellulitis; without open wounds Musculoskeletal: no muscle wasting or atrophy  Neurologic: A&O X 3; moving all extremities equally; speech is fluent/normal Psychiatric:  The pt has Normal affect.   Non-Invasive Vascular Imaging:   Carotid Duplex on 04/17/2021: Right:  1-39% ICA stenosis Left:  1-39% ICA stenosis Summary:  Right Carotid: Velocities in the right ICA are consistent with a 1-39%  stenosis.  Non-hemodynamically significant plaque <50% noted in the CCA.   Left Carotid: Velocities in the left ICA are consistent with a 1-39%  stenosis.  Non-hemodynamically significant plaque <50% noted in the CCA.   Vertebrals:  Left vertebral artery demonstrates antegrade flow. Right  vertebral artery demonstrates an occlusion.  Subclavians: Normal flow hemodynamics were seen in bilateral  subclavian arteries.   Previous Carotid duplex on 04/04/2020: Summary:  Right Carotid: Velocities in the right ICA are consistent with a 1-39%  stenosis.   Left Carotid: Velocities in the left ICA are consistent with a 1-39%  stenosis.   Vertebrals:  Left vertebral artery demonstrates antegrade flow. Right  vertebral artery demonstrates no discernable flow.  Subclavians: Normal flow hemodynamics were seen in bilateral subclavian arteries.    ASSESSMENT/PLAN:: 76 y.o. female here for follow up for left subclavian artery on 03/03/2017 by Dr. Trula Slade.  Pt has hx of stroke in October 2016 secondary to known right vertebral artery occlusion.     -duplex today reveals 1-39% bilateral ICA stenosis with normal flow hemodynamics in bilateral SCA.  She has easily palpable bilateral radial pulses and her BP is equal bilaterally -discussed s/s of stroke with pt and she understands should she develop any of these sx, she will go to the nearest ER or call 911. -pt will f/u in one year with carotid duplex -pt will call sooner should they have any issues. -continue statin/Plavix   Leontine Locket, [facility] [phone]  Clinic MD:  Trula Slade

## 2021-04-17 NOTE — Telephone Encounter (Signed)
Contacted patient and spoke with her husband. Dr Silverio Decamp said it is fine that she holds blood thinner for 2 days. Patient already had taken it today the husband thought.

## 2021-04-19 ENCOUNTER — Ambulatory Visit (AMBULATORY_SURGERY_CENTER): Payer: Medicare HMO | Admitting: Gastroenterology

## 2021-04-19 ENCOUNTER — Encounter: Payer: Self-pay | Admitting: Gastroenterology

## 2021-04-19 ENCOUNTER — Other Ambulatory Visit: Payer: Self-pay

## 2021-04-19 VITALS — BP 130/61 | HR 61 | Temp 96.8°F | Resp 15 | Ht 62.0 in | Wt 123.0 lb

## 2021-04-19 DIAGNOSIS — K200 Eosinophilic esophagitis: Secondary | ICD-10-CM

## 2021-04-19 DIAGNOSIS — K219 Gastro-esophageal reflux disease without esophagitis: Secondary | ICD-10-CM

## 2021-04-19 DIAGNOSIS — R1013 Epigastric pain: Secondary | ICD-10-CM | POA: Diagnosis not present

## 2021-04-19 DIAGNOSIS — K2970 Gastritis, unspecified, without bleeding: Secondary | ICD-10-CM | POA: Diagnosis not present

## 2021-04-19 DIAGNOSIS — R933 Abnormal findings on diagnostic imaging of other parts of digestive tract: Secondary | ICD-10-CM

## 2021-04-19 MED ORDER — PANTOPRAZOLE SODIUM 40 MG PO TBEC: 40.0000 mg | DELAYED_RELEASE_TABLET | Freq: Every day | ORAL | 3 refills | Status: DC

## 2021-04-19 MED ORDER — SODIUM CHLORIDE 0.9 % IV SOLN: 500.0000 mL | Freq: Once | INTRAVENOUS | Status: DC

## 2021-04-19 NOTE — Op Note (Signed)
[facility] Patient Name: [name] Procedure Date: 04/19/2021 9:33 AM MRN: [id number] Endoscopist: Mauri Pole , MD Age: 76 Referring MD:  Date of Birth: 03-11-1946 Gender: Female Account #: [id number] Procedure:                Upper GI endoscopy Indications:              Epigastric abdominal pain, Abdominal pain in the                            right upper quadrant, Gastro-esophageal reflux                            disease Medicines:                Monitored Anesthesia Care Procedure:                Pre-Anesthesia Assessment:                           - Prior to the procedure, a History and Physical                            was performed, and patient medications and                            allergies were reviewed. The patient's tolerance of                            previous anesthesia was also reviewed. The risks                            and benefits of the procedure and the sedation                            options and risks were discussed with the patient.                            All questions were answered, and informed consent                            was obtained. Prior Anticoagulants: The patient                            last took Plavix (clopidogrel) 4 days prior to the                            procedure. ASA Grade Assessment: III - A patient                            with severe systemic disease. After reviewing the                            risks and benefits, the patient was deemed in  satisfactory condition to undergo the procedure.                           After obtaining informed consent, the endoscope was                            passed under direct vision. Throughout the                            procedure, the patient's blood pressure, pulse, and                            oxygen saturations were monitored continuously. The                            Endoscope was introduced through the mouth,  and                            advanced to the second part of duodenum. The upper                            GI endoscopy was accomplished without difficulty.                            The patient tolerated the procedure well. Scope In: Scope Out: Findings:                 There were esophageal mucosal changes suggestive of                            short-segment Barrett's esophagus present in the                            lower third of the esophagus. The maximum                            longitudinal extent of these mucosal changes was 3                            cm in length. Mucosa was biopsied with a cold                            forceps for histology in a targeted manner at                            intervals of 1 cm from 33 to 36 cm from the                            incisors. One specimen bottle was sent to pathology.                           The exam of the esophagus was otherwise normal.  Patchy mild inflammation characterized by                            congestion (edema) and erythema was found in the                            entire examined stomach. Biopsies were taken with a                            cold forceps for Helicobacter pylori testing.                           The cardia and gastric fundus were normal on                            retroflexion.                           The exam of the stomach was otherwise normal.                           The examined duodenum was normal. Complications:            No immediate complications. Estimated Blood Loss:     Estimated blood loss was minimal. Impression:               - Esophageal mucosal changes suggestive of                            short-segment Barrett's esophagus. Biopsied.                           - Gastritis. Biopsied.                           - Normal examined duodenum. Recommendation:           - Resume previous diet.                           - Continue present  medications.                           - Resume Plavix (clopidogrel) at prior dose                            tomorrow. Refer to managing physician for further                            adjustment of therapy.                           - Follow an antireflux regimen.                           - Use Protonix (pantoprazole) 40 mg PO daily.  Please send Rx 90 tabs with 3 refills                           - Return to GI office in 3 months. Mauri Pole, MD 04/19/2021 10:07:36 AM This report has been signed electronically.

## 2021-04-19 NOTE — Progress Notes (Signed)
Please refer to office visit note 04/12/21. No additional changes in H&P Patient is appropriate for planned procedure(s) and anesthesia in an ambulatory setting  K. Denzil Magnuson , MD [phone]

## 2021-04-19 NOTE — Patient Instructions (Signed)
Please read handouts provided. Continue present medications. Await pathology results. Resume Plavix ( clopidogrel ) at prior dose tomorrow. Follow an antireflux regimen. Use Protonix ( pantoprazole ) 40 mg daily. Return to GI office in 3 months.   YOU HAD AN ENDOSCOPIC PROCEDURE TODAY AT [facility]:   Refer to the procedure report that was given to you for any specific questions about what was found during the examination.  If the procedure report does not answer your questions, please call your gastroenterologist to clarify.  If you requested that your care partner not be given the details of your procedure findings, then the procedure report has been included in a sealed envelope for you to review at your convenience later.  YOU SHOULD EXPECT: Some feelings of bloating in the abdomen. Passage of more gas than usual.  Walking can help get rid of the air that was put into your GI tract during the procedure and reduce the bloating. If you had a lower endoscopy (such as a colonoscopy or flexible sigmoidoscopy) you may notice spotting of blood in your stool or on the toilet paper. If you underwent a bowel prep for your procedure, you may not have a normal bowel movement for a few days.  Please Note:  You might notice some irritation and congestion in your nose or some drainage.  This is from the oxygen used during your procedure.  There is no need for concern and it should clear up in a day or so.  SYMPTOMS TO REPORT IMMEDIATELY:   Following upper endoscopy (EGD)  Vomiting of blood or coffee ground material  New chest pain or pain under the shoulder blades  Painful or persistently difficult swallowing  New shortness of breath  Fever of 100F or higher  Black, tarry-looking stools  For urgent or emergent issues, a gastroenterologist can be reached at any hour by calling [phone]. Do not use MyChart messaging for urgent concerns.    DIET:  We do recommend a small meal  at first, but then you may proceed to your regular diet.  Drink plenty of fluids but you should avoid alcoholic beverages for 24 hours.  ACTIVITY:  You should plan to take it easy for the rest of today and you should NOT DRIVE or use heavy machinery until tomorrow (because of the sedation medicines used during the test).    FOLLOW UP: Our staff will call the number listed on your records 48-72 hours following your procedure to check on you and address any questions or concerns that you may have regarding the information given to you following your procedure. If we do not reach you, we will leave a message.  We will attempt to reach you two times.  During this call, we will ask if you have developed any symptoms of COVID 19. If you develop any symptoms (ie: fever, flu-like symptoms, shortness of breath, cough etc.) before then, please call [phone].  If you test positive for Covid 19 in the 2 weeks post procedure, please call and report this information to [place].    If any biopsies were taken you will be contacted by phone or by letter within the next 1-3 weeks.  Please call us at [phone] if you have not heard about the biopsies in 3 weeks.    SIGNATURES/CONFIDENTIALITY: You and/or your care partner have signed paperwork which will be entered into your electronic medical record.  These signatures attest to the fact that that the information above on  your After Visit Summary has been reviewed and is understood.  Full responsibility of the confidentiality of this discharge information lies with you and/or your care-partner.

## 2021-04-19 NOTE — Progress Notes (Signed)
A and O x3. Report to RN. Tolerated MAC anesthesia well. Teeth unchanged after procedure.

## 2021-04-19 NOTE — Progress Notes (Signed)
Pt's states no medical or surgical changes since previsit or office visit. 

## 2021-04-19 NOTE — Progress Notes (Signed)
Called to room to assist during endoscopic procedure.  Patient ID and intended procedure confirmed with present staff. Received instructions for my participation in the procedure from the performing physician.  

## 2021-04-21 ENCOUNTER — Telehealth: Payer: Self-pay

## 2021-04-21 NOTE — Telephone Encounter (Signed)
°  Follow up Call-  Call back number 04/19/2021  Post procedure Call Back phone  # [phone]  Permission to leave phone message Yes  Some recent data might be hidden     Patient questions:  Do you have a fever, pain , or abdominal swelling? No. Pain Score  0 *  Have you tolerated food without any problems? Yes.    Have you been able to return to your normal activities? Yes.    Do you have any questions about your discharge instructions: Diet   No. Medications  No. Follow up visit  No.  Do you have questions or concerns about your Care? No.  Actions: * If pain score is 4 or above: No action needed, pain <4.

## 2021-04-26 DIAGNOSIS — M810 Age-related osteoporosis without current pathological fracture: Secondary | ICD-10-CM | POA: Diagnosis not present

## 2021-04-27 ENCOUNTER — Encounter: Payer: Self-pay | Admitting: Gastroenterology

## 2021-05-12 ENCOUNTER — Other Ambulatory Visit: Payer: Self-pay

## 2021-05-12 ENCOUNTER — Ambulatory Visit: Payer: Medicare HMO | Admitting: Pulmonary Disease

## 2021-05-12 ENCOUNTER — Encounter: Payer: Self-pay | Admitting: Pulmonary Disease

## 2021-05-12 VITALS — BP 126/62 | HR 71 | Ht 62.0 in | Wt 120.0 lb

## 2021-05-12 DIAGNOSIS — R9389 Abnormal findings on diagnostic imaging of other specified body structures: Secondary | ICD-10-CM

## 2021-05-12 NOTE — Patient Instructions (Signed)
The tree-in-bud opacities noted on your CT scans are nonspecific and could be related to a slow growing infection. This does not require treatment unless you develop worsening symptoms. ? ?If you develop worsening cough, shortness of breath, fevers, lack of appetite or weight loss please contact our clinic and we will check a CT Chest scan.  ? ?Follow up as needed.  ?

## 2021-05-12 NOTE — Progress Notes (Signed)
Synopsis: Referred in March 2023 for abnormal CT Chest scan by Lorrene Reid, PA  Subjective:   PATIENT ID: [name] GENDER: female DOB: [birth year], MRN: [id number]   HPI  Chief Complaint  Patient presents with   Consult    Referred by PCP for abnormal CT back in December 2022. Prior to CT scan she did have DOE.    [name] is a 76 year old woman, former smoker with GERD, hypothyroidism and stroke who is referred to pulmonary clinic for abnormal chest imaging.   CT abdomen 03/08/21 showed tree-in-bud nodularity of the lingula and CT coronary scan 2021 showed tree-in-bud nodularity of the superior segment of the left lower lobe posteriorly.   She denies fevers, chills or sweats. She denies cough. She may experience some intermittent dyspnea with exertion and at rest. She denies wheezing. She denies any concern for dysphagia or active GERD.   She is a former smoker. She is retired. No pets at home.  Past Medical History:  Diagnosis Date   Anxiety    Barrett esophagus 2003   Diverticulitis    Diverticulosis    GERD (gastroesophageal reflux disease)    Glaucoma    Grave's disease    H. pylori infection    Hepatitis A    in her 25 from food   Hyperlipidemia    Hypothyroidism    Increased pressure in the eye    Insomnia    Morton's neuroma    Murmur    Scoliosis    Stroke ([place]) 12/2014   no deficits     Family History  Problem Relation Age of Onset   Heart failure Mother 39   Heart murmur Mother    Other Father 25       SEPSIS   Hypertension Father    Stroke Father 59       CVA   Diabetes Sister    Heart failure Sister    Breast cancer Sister    Stroke Sister    Heart attack Sister 24   Hypertension Sister    Diabetes Brother    Hypertension Brother    Heart disease Brother    Cirrhosis Brother    Heart murmur Daughter    Breast cancer Maternal Aunt    Breast cancer Paternal Aunt    Breast cancer Cousin    Breast cancer Cousin    Breast  cancer Cousin    Breast cancer Cousin    Stomach cancer Neg Hx    Colon cancer Neg Hx    Esophageal cancer Neg Hx    Pancreatic cancer Neg Hx      Social History   Socioeconomic History   Marital status: Married    Spouse name: Not on file   Number of children: 1   Years of education: Not on file   Highest education level: Not on file  Occupational History   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 [employer]  Tobacco Use   Smoking status: Former    Packs/day: 0.25    Types: Cigarettes    Quit date: 03/12/1978    Years since quitting: 43.1   Smokeless tobacco: Never   Tobacco comments:    quit in 1978  Vaping Use   Vaping Use: Never used  Substance and Sexual Activity   Alcohol use: Yes    Alcohol/week: 7.0 standard drinks    Types: 7 Glasses of wine per week    Comment: 1  glass of wine every evening   Drug use: No   Sexual activity: Not Currently    Birth control/protection: None  Other Topics Concern   Not on file  Social History Narrative   Not on file   Social Determinants of Health   Financial Resource Strain: Not on file  Food Insecurity: Not on file  Transportation Needs: Not on file  Physical Activity: Not on file  Stress: Not on file  Social Connections: Not on file  Intimate Partner Violence: Not on file     Allergies  Allergen Reactions   Neuromuscular Blocking Agents Other (See Comments)    Multiple symptoms, SOB, fatigue, weakness   Atorvastatin Other (See Comments)    itching   Morphine And Related Rash     Outpatient Medications Prior to Visit  Medication Sig Dispense Refill   acetaminophen (TYLENOL) 500 MG tablet Take 500 mg by mouth daily as needed for moderate pain or headache.      Calcium Citrate-Vitamin D (CALCIUM CITRATE + PO) Take by mouth daily at 12 noon.     clopidogrel (PLAVIX) 75 MG tablet TAKE 1 TABLET BY MOUTH EVERY DAY 90 tablet 1   diazepam (VALIUM) 5 MG tablet Take 1 tablet (5 mg total) by mouth  at bedtime as needed for anxiety (sleep). 30 tablet 0   dorzolamide-timolol (COSOPT) 22.3-6.8 MG/ML ophthalmic solution Place into both eyes daily.     isosorbide mononitrate (IMDUR) 30 MG 24 hr tablet TAKE 1/2 OF A TABLET (15 MG TOTAL) BY MOUTH DAILY 45 tablet 0   latanoprost (XALATAN) 0.005 % ophthalmic solution 1 drop at bedtime.     Lutein 6 MG TABS      Multiple Vitamin (MULTIVITAMIN) tablet Take 1 tablet by mouth daily.     pantoprazole (PROTONIX) 40 MG tablet Take 1 tablet (40 mg total) by mouth daily. 90 tablet 3   rosuvastatin (CRESTOR) 40 MG tablet TAKE 1 TABLET BY MOUTH EVERY DAY 90 tablet 0   thyroid (ARMOUR) 60 MG tablet 1 tablet on an empty stomach     No facility-administered medications prior to visit.   Review of Systems  Constitutional:  Negative for chills, fever, malaise/fatigue and weight loss.  HENT:  Negative for congestion, sinus pain and sore throat.   Eyes: Negative.   Respiratory:  Positive for shortness of breath. Negative for cough, hemoptysis, sputum production and wheezing.   Cardiovascular:  Negative for chest pain, palpitations, orthopnea, claudication and leg swelling.  Gastrointestinal:  Negative for abdominal pain, heartburn, nausea and vomiting.  Genitourinary: Negative.   Musculoskeletal:  Negative for joint pain and myalgias.  Skin:  Negative for rash.  Neurological:  Negative for weakness.  Endo/Heme/Allergies: Negative.   Psychiatric/Behavioral: Negative.     Objective:   Vitals:   05/12/21 1438  BP: 126/62  Pulse: 71  SpO2: 98%  Weight: 120 lb (54.4 kg)  Height: _0  (1.575 m)   Physical Exam Constitutional:      General: She is not in acute distress.    Appearance: She is not ill-appearing.  HENT:     Head: Normocephalic and atraumatic.  Eyes:     General: No scleral icterus.    Conjunctiva/sclera: Conjunctivae normal.     Pupils: Pupils are equal, round, and reactive to light.  Cardiovascular:     Rate and Rhythm: Normal rate  and regular rhythm.     Pulses: Normal pulses.     Heart sounds: Normal heart sounds. No murmur heard. Pulmonary:  Effort: Pulmonary effort is normal.     Breath sounds: Normal breath sounds. No wheezing, rhonchi or rales.  Abdominal:     General: Bowel sounds are normal.     Palpations: Abdomen is soft.  Musculoskeletal:     Right lower leg: No edema.     Left lower leg: No edema.  Lymphadenopathy:     Cervical: No cervical adenopathy.  Skin:    General: Skin is warm and dry.  Neurological:     General: No focal deficit present.     Mental Status: She is alert.  Psychiatric:        Mood and Affect: Mood normal.        Behavior: Behavior normal.        Thought Content: Thought content normal.        Judgment: Judgment normal.   CBC    Component Value Date/Time   WBC 5.5 02/28/2021 1120   WBC 5.7 09/23/2017 1407   RBC 4.90 02/28/2021 1120   RBC 4.73 09/23/2017 1407   HGB 15.0 02/28/2021 1120   HGB 14.6 12/02/2009 1426   HCT 45.4 02/28/2021 1120   HCT 43.8 12/02/2009 1426   PLT 232 02/28/2021 1120   MCV 93 02/28/2021 1120   MCV 90.2 12/02/2009 1426   MCH 30.6 02/28/2021 1120   MCH 30.0 09/23/2017 1407   MCHC 33.0 02/28/2021 1120   MCHC 31.9 09/23/2017 1407   RDW 12.2 02/28/2021 1120   RDW 12.7 12/02/2009 1426   LYMPHSABS 1.7 02/28/2021 1120   LYMPHSABS 2.0 12/02/2009 1426   MONOABS 0.7 07/26/2017 1519   MONOABS 0.6 12/02/2009 1426   EOSABS 0.1 02/28/2021 1120   BASOSABS 0.0 02/28/2021 1120   BASOSABS 0.0 12/02/2009 1426   BMP Latest Ref Rng & Units 02/28/2021 10/12/2020 12/17/2019  Glucose 70 - 99 mg/dL 95 110(H) -  BUN 8 - 27 mg/dL 19 13 -  Creatinine 0.57 - 1.00 mg/dL 0.77 0.80 0.80  BUN/Creat Ratio 12 - _0 -  Sodium 134 - 144 mmol/L 137 140 -  Potassium 3.5 - 5.2 mmol/L 5.3(H) 4.9 -  Chloride 96 - 106 mmol/L 100 101 -  CO2 20 - 29 mmol/L 25 25 -  Calcium 8.7 - 10.3 mg/dL 9.6 10.0 -   Chest imaging: CT Abd 03/08/21 Lower chest: Tree in bud  nodular foci in the lingula likely infectious or inflammatory.  CT Coronary Scan 12/17/19 1. No significant, noncardiac, supplemental findings identified. 2. Clustered area of tree-in-bud nodularity identified within the superior segment of left lower lobe. Imaging findings likely reflect inflammatory/infectious bronchiolitis.  PFT: No flowsheet data found.  Labs:  Path:  Echo:  Heart Catheterization:       Assessment & Plan:   Abnormal chest CT  Discussion: Kajsa Butrum is a 76 year old woman, former smoker with GERD, hypothyroidism and stroke who is referred to pulmonary clinic for abnormal chest imaging.   She has noted tree-in-bud nodularity on CT abdomen and CT coronary scan in the past in different locations.   She does not show signs of active or indolent infection. We discussed the possibility of MAC involvement leading to these findings on her CT scans. I offered to check a dedicated CT chest scan for further evaluation but given her lack of symptoms at this time, it would not change our current management of watchful waiting.   We discussed if she developed cough, worsening shortness of breath, fevers, chills, weight loss and lack of appetite that  she should call for follow up appointment.   Follow up as needed.   Freda Jackson, MD [facility] Office: [phone]    Current Outpatient Medications:    acetaminophen (TYLENOL) 500 MG tablet, Take 500 mg by mouth daily as needed for moderate pain or headache. , Disp: , Rfl:    Calcium Citrate-Vitamin D (CALCIUM CITRATE + PO), Take by mouth daily at 12 noon., Disp: , Rfl:    clopidogrel (PLAVIX) 75 MG tablet, TAKE 1 TABLET BY MOUTH EVERY DAY, Disp: 90 tablet, Rfl: 1   diazepam (VALIUM) 5 MG tablet, Take 1 tablet (5 mg total) by mouth at bedtime as needed for anxiety (sleep)., Disp: 30 tablet, Rfl: 0   dorzolamide-timolol (COSOPT) 22.3-6.8 MG/ML ophthalmic solution, Place into both eyes  daily., Disp: , Rfl:    isosorbide mononitrate (IMDUR) 30 MG 24 hr tablet, TAKE 1/2 OF A TABLET (15 MG TOTAL) BY MOUTH DAILY, Disp: 45 tablet, Rfl: 0   latanoprost (XALATAN) 0.005 % ophthalmic solution, 1 drop at bedtime., Disp: , Rfl:    Lutein 6 MG TABS, , Disp: , Rfl:    Multiple Vitamin (MULTIVITAMIN) tablet, Take 1 tablet by mouth daily., Disp: , Rfl:    pantoprazole (PROTONIX) 40 MG tablet, Take 1 tablet (40 mg total) by mouth daily., Disp: 90 tablet, Rfl: 3   rosuvastatin (CRESTOR) 40 MG tablet, TAKE 1 TABLET BY MOUTH EVERY DAY, Disp: 90 tablet, Rfl: 0   thyroid (ARMOUR) 60 MG tablet, 1 tablet on an empty stomach, Disp: , Rfl:

## 2021-06-02 ENCOUNTER — Other Ambulatory Visit: Payer: Self-pay | Admitting: Internal Medicine

## 2021-06-05 DIAGNOSIS — E890 Postprocedural hypothyroidism: Secondary | ICD-10-CM | POA: Diagnosis not present

## 2021-07-11 ENCOUNTER — Other Ambulatory Visit: Payer: Self-pay | Admitting: Internal Medicine

## 2021-08-17 DIAGNOSIS — E890 Postprocedural hypothyroidism: Secondary | ICD-10-CM | POA: Diagnosis not present

## 2021-08-21 DIAGNOSIS — E890 Postprocedural hypothyroidism: Secondary | ICD-10-CM | POA: Diagnosis not present

## 2021-08-28 DIAGNOSIS — H401133 Primary open-angle glaucoma, bilateral, severe stage: Secondary | ICD-10-CM | POA: Diagnosis not present

## 2021-08-29 ENCOUNTER — Other Ambulatory Visit: Payer: Self-pay | Admitting: Physician Assistant

## 2021-10-14 ENCOUNTER — Other Ambulatory Visit: Payer: Self-pay | Admitting: Internal Medicine

## 2021-10-23 DIAGNOSIS — Z0100 Encounter for examination of eyes and vision without abnormal findings: Secondary | ICD-10-CM | POA: Diagnosis not present

## 2021-11-20 DIAGNOSIS — E890 Postprocedural hypothyroidism: Secondary | ICD-10-CM | POA: Diagnosis not present

## 2021-12-02 ENCOUNTER — Other Ambulatory Visit: Payer: Self-pay | Admitting: Internal Medicine

## 2021-12-20 ENCOUNTER — Other Ambulatory Visit: Payer: Self-pay | Admitting: Physician Assistant

## 2021-12-20 DIAGNOSIS — Z1231 Encounter for screening mammogram for malignant neoplasm of breast: Secondary | ICD-10-CM

## 2021-12-25 ENCOUNTER — Encounter: Payer: Self-pay | Admitting: Physician Assistant

## 2021-12-25 ENCOUNTER — Ambulatory Visit (INDEPENDENT_AMBULATORY_CARE_PROVIDER_SITE_OTHER): Payer: Medicare HMO | Admitting: Physician Assistant

## 2021-12-25 VITALS — BP 108/61 | HR 81 | Ht 62.0 in | Wt 119.6 lb

## 2021-12-25 DIAGNOSIS — Z0000 Encounter for general adult medical examination without abnormal findings: Secondary | ICD-10-CM

## 2021-12-25 NOTE — Progress Notes (Addendum)
Subjective:   [name] is a 76 y.o. female who presents for Medicare Annual (Subsequent) preventive examination.  Review of Systems    Referred to PCP       Objective:    Today's Vitals   12/25/21 1353  BP: 108/61  Pulse: 81  SpO2: 98%  Weight: 119 lb 9.6 oz (54.3 kg)  Height: '5\' 2"'$  (1.575 m)   Body mass index is 21.88 kg/m.     05/16/2018   11:22 AM 02/17/2018    2:39 PM 02/12/2018   10:33 AM 07/26/2017    3:04 PM 04/19/2017    6:25 AM 12/26/2016   10:25 AM 08/10/2016    9:14 AM  Advanced Directives  Does Patient Have a Medical Advance Directive? Yes Yes Yes No Yes Yes Yes  Type of Paramedic of [place];Living will Living will;Healthcare Power of [place];Living will  [place];Living will [place];Living will   Does patient want to make changes to medical advance directive? No - Patient declined  No - Patient declined  No - Patient declined    Copy of [place] in Chart? No - copy requested  No - copy requested  No - copy requested      Current Medications (verified) Outpatient Encounter Medications as of 12/25/2021  Medication Sig   acetaminophen (TYLENOL) 500 MG tablet Take 500 mg by mouth daily as needed for moderate pain or headache.    Calcium Citrate-Vitamin D (CALCIUM CITRATE + PO) Take by mouth daily at 12 noon.   clopidogrel (PLAVIX) 75 MG tablet TAKE 1 TABLET BY MOUTH EVERY DAY   diazepam (VALIUM) 5 MG tablet Take 1 tablet (5 mg total) by mouth at bedtime as needed for anxiety (sleep).   dorzolamide-timolol (COSOPT) 22.3-6.8 MG/ML ophthalmic solution Place into both eyes daily.   isosorbide mononitrate (IMDUR) 30 MG 24 hr tablet TAKE 1/2 OF A TABLET (15 MG TOTAL) BY MOUTH DAILY   latanoprost (XALATAN) 0.005 % ophthalmic solution 1 drop at bedtime.   levothyroxine (SYNTHROID) 75 MCG tablet Take 75 mcg by mouth every morning.   Lutein 6 MG TABS     Multiple Vitamin (MULTIVITAMIN) tablet Take 1 tablet by mouth daily.   pantoprazole (PROTONIX) 40 MG tablet Take 1 tablet (40 mg total) by mouth daily.   rosuvastatin (CRESTOR) 40 MG tablet TAKE 1 TABLET BY MOUTH EVERY DAY   [DISCONTINUED] thyroid (ARMOUR) 60 MG tablet 1 tablet on an empty stomach   No facility-administered encounter medications on file as of 12/25/2021.    Allergies (verified) Neuromuscular blocking agents, Atorvastatin, and Morphine and related   History: Past Medical History:  Diagnosis Date   Anxiety    Barrett esophagus 2003   Diverticulitis    Diverticulosis    GERD (gastroesophageal reflux disease)    Glaucoma    Grave's disease    H. pylori infection    Hepatitis A    in her 57 from food   Hyperlipidemia    Hypothyroidism    Increased pressure in the eye    Insomnia    Morton's neuroma    Murmur    Scoliosis    Stroke ([place]) 12/2014   no deficits   Past Surgical History:  Procedure Laterality Date   AORTIC ARCH ANGIOGRAPHY N/A 01/01/2017   Procedure: AORTIC ARCH ANGIOGRAPHY;  Surgeon: Serafina Mitchell, MD;  Location: [facility];  Service: Cardiovascular;  Laterality: N/A;  APPENDECTOMY  1958   CATARACT EXTRACTION Left    CATARACT EXTRACTION  02/12/2018   CATARACT EXTRACTION W/PHACO Left 07/02/2016   Procedure: CATARACT EXTRACTION PHACO AND INTRAOCULAR LENS PLACEMENT ([place])  Left;  Surgeon: Ronnell Freshwater, MD;  Location: [facility];  Service: Ophthalmology;  Laterality: Left;   CATARACT EXTRACTION W/PHACO Right 02/12/2018   Procedure: CATARACT EXTRACTION PHACO AND INTRAOCULAR LENS PLACEMENT ([place])  RIGHT;  Surgeon: Leandrew Koyanagi, MD;  Location: [facility];  Service: Ophthalmology;  Laterality: Right;   CERVICAL LAMINECTOMY  1995   COLON SURGERY     EYE SURGERY     due to graves disease   FEMUR SURGERY Right    benign bone growth   FOOT NEUROMA SURGERY Right    INTRAVASCULAR PRESSURE WIRE/FFR STUDY N/A  04/19/2017   Procedure: INTRAVASCULAR PRESSURE WIRE/FFR STUDY;  Surgeon: Nelva Bush, MD;  Location: [facility];  Service: Cardiovascular;  Laterality: N/A;   LAPAROSCOPIC ASSISTED VAGINAL HYSTERECTOMY     LAPAROSCOPIC SIGMOID COLECTOMY  05/13/06   Dr Zella Richer   LEFT HEART CATH AND CORONARY ANGIOGRAPHY N/A 04/19/2017   Procedure: LEFT HEART CATH AND CORONARY ANGIOGRAPHY;  Surgeon: Nelva Bush, MD;  Location: [facility];  Service: Cardiovascular;  Laterality: N/A;   LUMBAR DISC SURGERY  2009   PERIPHERAL VASCULAR INTERVENTION Left 01/01/2017   Procedure: PERIPHERAL VASCULAR INTERVENTION;  Surgeon: Serafina Mitchell, MD;  Location: [facility];  Service: Cardiovascular;  Laterality: Left;   SUBCLAVIAN STENT PLACEMENT     TONSILLECTOMY  1992   TRIGGER FINGER RELEASE Right 05/26/2018   Procedure: RIGHT INDEX RELEASE TRIGGER FINGER/A-1 PULLEY;  Surgeon: [name], MD;  Location: [facility];  Service: Orthopedics;  Laterality: Right;   TUBAL LIGATION     UPPER EXTREMITY ANGIOGRAPHY Left 01/01/2017   Procedure: Upper Extremity Angiography;  Surgeon: Serafina Mitchell, MD;  Location: [facility];  Service: Cardiovascular;  Laterality: Left;   Family History  Problem Relation Age of Onset   Heart failure Mother 21   Heart murmur Mother    Other Father 76       SEPSIS   Hypertension Father    Stroke Father 58       CVA   Diabetes Sister    Heart failure Sister    Breast cancer Sister    Stroke Sister    Heart attack Sister 70   Hypertension Sister    Diabetes Brother    Hypertension Brother    Heart disease Brother    Cirrhosis Brother    Heart murmur Daughter    Breast cancer Maternal Aunt    Breast cancer Paternal Aunt    Breast cancer Cousin    Breast cancer Cousin    Breast cancer Cousin    Breast cancer Cousin    Stomach cancer Neg Hx    Colon cancer Neg Hx    Esophageal cancer Neg Hx    Pancreatic cancer Neg Hx    Social  History   Socioeconomic History   Marital status: Married    Spouse name: Not on file   Number of children: 1   Years of education: Not on file   Highest education level: Not on file  Occupational History   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 [employer]  Tobacco Use   Smoking status: Former    Packs/day: 0.25    Types: Cigarettes    Quit  date: 03/12/1978    Years since quitting: 43.8   Smokeless tobacco: Never   Tobacco comments:    quit in 1978  Vaping Use   Vaping Use: Never used  Substance and Sexual Activity   Alcohol use: Yes    Alcohol/week: 7.0 standard drinks of alcohol    Types: 7 Glasses of wine per week    Comment: 1 glass of wine every evening   Drug use: No   Sexual activity: Not Currently    Birth control/protection: None  Other Topics Concern   Not on file  Social History Narrative   Not on file   Social Determinants of Health   Financial Resource Strain: Low Risk  (12/25/2021)   Overall Financial Resource Strain (CARDIA)    Difficulty of Paying Living Expenses: Not hard at all  Food Insecurity: No Food Insecurity (12/25/2021)   Hunger Vital Sign    Worried About Running Out of Food in the Last Year: Never true    Ran Out of Food in the Last Year: Never true  Transportation Needs: No Transportation Needs (12/25/2021)   PRAPARE - Hydrologist (Medical): No    Lack of Transportation (Non-Medical): No  Physical Activity: Sufficiently Active (12/25/2021)   Exercise Vital Sign    Days of Exercise per Week: 4 days    Minutes of Exercise per Session: 50 min  Stress: No Stress Concern Present (12/25/2021)   [place]    Feeling of Stress : Only a little  Social Connections: Socially Integrated (12/25/2021)   Social Connection and Isolation Panel [NHANES]    Frequency of Communication with Friends and Family: More than three times a  week    Frequency of Social Gatherings with Friends and Family: Twice a week    Attends Religious Services: More than 4 times per year    Active Member of [employer] or Organizations: No    Attends Music therapist: More than 4 times per year    Marital Status: Married    Tobacco Counseling Counseling given: Not Answered Tobacco comments: quit in [place]:                 [place] of Daily Living    12/25/2021    1:55 PM 02/28/2021   11:00 AM  In your present state of health, do you have any difficulty performing the following activities:  Hearing? 0 0  Vision? 1 0  Difficulty concentrating or making decisions? 0 0  Walking or climbing stairs? 0 0  Dressing or bathing? 0 0  Doing errands, shopping? 0 0    Patient Care Team: Lorrene Reid, PA-C as PCP - General (Physician Assistant) End, Harrell Gave, MD as PCP - Cardiology (Cardiology) Almedia Balls, MD as Consulting Physician (Orthopedic Surgery) Garvin Fila, MD as Consulting Physician (Neurology) Milus Banister, MD as Attending Physician (Gastroenterology) Karren Burly Deirdre Peer, MD as Referring Physician (Ophthalmology)  Indicate any recent Medical Services you may have received from other than [facility] providers in the past year (date may be approximate).     Assessment:   This is a routine wellness examination for [place].  Hearing/Vision screen No results found.  Dietary issues and exercise activities discussed:     Goals Addressed   None   Depression Screen    12/25/2021    1:54 PM 02/28/2021   11:00 AM  10/19/2020    8:56 AM 09/27/2020    9:01 AM 08/16/2020   10:59 AM 04/20/2020   10:20 AM 10/19/2019   10:47 AM  PHQ 2/9 Scores  PHQ - 2 Score 0 0 0 0 0 0 0  PHQ- 9 Score 2 2 0 0 0 0 0    Fall Risk    02/28/2021   10:59 AM 10/19/2020    8:56 AM 09/27/2020    9:01 AM 08/16/2020   10:59 AM 04/20/2020   10:20 AM  Fall Risk   Falls in the  past year? 0 0 0 0 0  Number falls in past yr: 0 0 0 0   Injury with Fall? 0 0 0 0   Risk for fall due to : No Fall Risks No Fall Risks     Follow up Falls evaluation completed Falls evaluation completed Falls evaluation completed Falls evaluation completed Falls evaluation completed    [place]:  Any stairs in or around the home? Yes  If so, are there any without handrails? No  Home free of loose throw rugs in walkways, pet beds, electrical cords, etc? Yes  Adequate lighting in your home to reduce risk of falls? Yes   ASSISTIVE DEVICES UTILIZED TO PREVENT FALLS:  Life alert? No  Use of a cane, walker or w/c? No  Grab bars in the bathroom? Yes  Shower chair or bench in shower? Yes  Elevated toilet seat or a handicapped toilet? No   TIMED UP AND GO:  Was the test performed? Yes .  Length of time to ambulate 10 feet: 10 sec.   Gait steady and fast without use of assistive device  Cognitive Function:        12/25/2021    1:57 PM 10/19/2020    8:57 AM 10/19/2019   11:06 AM 09/17/2016   10:40 AM  6CIT Screen  What Year? 0 points 0 points 0 points 0 points  What month? 0 points 0 points 0 points 0 points  What time? 0 points 0 points 0 points 0 points  Count back from 20 0 points 0 points 0 points 0 points  Months in reverse 2 points 0 points 0 points 0 points  Repeat phrase 0 points 0 points 4 points 0 points  Total Score 2 points 0 points 4 points 0 points    Immunizations Immunization History  Administered Date(s) Administered   Influenza, High Dose Seasonal PF 02/24/2015   Influenza-Unspecified 01/09/2016, 12/12/2016, 12/11/2018   PFIZER(Purple Top)SARS-COV-2 Vaccination 04/08/2019, 04/29/2019   Pneumococcal Conjugate-13 12/12/2016   Pneumococcal Polysaccharide-23 09/18/2018    TDAP status: Due, Education has been provided regarding the importance of this vaccine. Advised may receive this vaccine at local pharmacy or Health Dept.  Aware to provide a copy of the vaccination record if obtained from local pharmacy or Health Dept. Verbalized acceptance and understanding.  Flu Vaccine status: Due, Education has been provided regarding the importance of this vaccine. Advised may receive this vaccine at local pharmacy or Health Dept. Aware to provide a copy of the vaccination record if obtained from local pharmacy or Health Dept. Verbalized acceptance and understanding.  Pneumococcal vaccine status: Up to date  Covid-19 vaccine status: Completed vaccines  Qualifies for Shingles Vaccine? Yes   Zostavax completed No   Shingrix Completed?: No.    Education has been provided regarding the importance of this vaccine. Patient has been advised to call insurance company to determine out of pocket  expense if they have not yet received this vaccine. Advised may also receive vaccine at local pharmacy or Health Dept. Verbalized acceptance and understanding.  Screening Tests Health Maintenance  Topic Date Due   Hepatitis C Screening  Never done   TETANUS/TDAP  Never done   Zoster Vaccines- Shingrix (1 of 2) Never done   COVID-19 Vaccine (3 - Pfizer series) 06/24/2019   INFLUENZA VACCINE  10/10/2021   Pneumonia Vaccine 75+ Years old  Completed   DEXA SCAN  Completed   HPV VACCINES  Aged Out   COLONOSCOPY (Pts 45-21yr Insurance coverage will need to be confirmed)  Discontinued    Health Maintenance  Health Maintenance Due  Topic Date Due   Hepatitis C Screening  Never done   TETANUS/TDAP  Never done   Zoster Vaccines- Shingrix (1 of 2) Never done   COVID-19 Vaccine (3 - Pfizer series) 06/24/2019   INFLUENZA VACCINE  10/10/2021    Colorectal cancer screening: Type of screening: Colonoscopy. Completed 2018. Repeat every 5 years  Mammogram status: Completed 2022. Repeat every year  Bone Density status: Completed 2016. Results reflect: Bone density results: OSTEOPENIA. Repeat every n/a years.  Lung Cancer Screening: (Low  Dose CT Chest recommended if Age 77-80years, 30 pack-year currently smoking OR have quit w/in 15years.) does not qualify.   Lung Cancer Screening Referral: no  Additional Screening:  Hepatitis C Screening: does qualify; Completed deferred   Vision Screening: Recommended annual ophthalmology exams for early detection of glaucoma and other disorders of the eye. Is the patient up to date with their annual eye exam?  Yes  Who is the provider or what is the name of the office in which the patient attends annual eye exams? Dr. DSande BrothersIf pt is not established with a provider, would they like to be referred to a provider to establish care? No .   Dental Screening: Recommended annual dental exams for proper oral hygiene  Community Resource Referral / Chronic Care Management: CRR required this visit?  No   CCM required this visit?  No      Plan:    I have personally reviewed and noted the following in the patient's chart:   Medical and social history Use of alcohol, tobacco or illicit drugs  Current medications and supplements including opioid prescriptions. Patient is not currently taking opioid prescriptions. Functional ability and status Nutritional status Physical activity Advanced directives List of other physicians Hospitalizations, surgeries, and ER visits in previous 12 months Vitals Screenings to include cognitive, depression, and falls Referrals and appointments  In addition, I have reviewed and discussed with patient certain preventive protocols, quality metrics, and best practice recommendations. A written personalized care plan for preventive services as well as general preventive health recommendations were provided to patient.  I connected with ELorene Dyon 12/25/21 by audio only telemedicine application and verified that I am speaking with the correct person using two identifiers.   I discussed the limitations, risks, security and privacy concerns of  performing an evaluation and management service in person availability of in person appointments. I also discussed with the patient responsible charge related to this service. The patient expressed understanding and verbally consented to this telephonic visit.   Locations of Patient:in office  Location of [place]  List any persons and their role that are participating in this visit with patient:    MLorrene Reid PHershal Coria  12/25/2021   Nurse Notes:face to face 25 min

## 2022-01-01 ENCOUNTER — Other Ambulatory Visit: Payer: Self-pay | Admitting: Internal Medicine

## 2022-01-01 DIAGNOSIS — H401132 Primary open-angle glaucoma, bilateral, moderate stage: Secondary | ICD-10-CM | POA: Diagnosis not present

## 2022-01-01 DIAGNOSIS — H401133 Primary open-angle glaucoma, bilateral, severe stage: Secondary | ICD-10-CM | POA: Diagnosis not present

## 2022-01-01 DIAGNOSIS — H353131 Nonexudative age-related macular degeneration, bilateral, early dry stage: Secondary | ICD-10-CM | POA: Diagnosis not present

## 2022-01-25 DIAGNOSIS — I209 Angina pectoris, unspecified: Secondary | ICD-10-CM | POA: Diagnosis not present

## 2022-01-25 DIAGNOSIS — R69 Illness, unspecified: Secondary | ICD-10-CM | POA: Diagnosis not present

## 2022-01-25 DIAGNOSIS — Z8249 Family history of ischemic heart disease and other diseases of the circulatory system: Secondary | ICD-10-CM | POA: Diagnosis not present

## 2022-01-25 DIAGNOSIS — Z803 Family history of malignant neoplasm of breast: Secondary | ICD-10-CM | POA: Diagnosis not present

## 2022-01-25 DIAGNOSIS — E785 Hyperlipidemia, unspecified: Secondary | ICD-10-CM | POA: Diagnosis not present

## 2022-01-25 DIAGNOSIS — Z87891 Personal history of nicotine dependence: Secondary | ICD-10-CM | POA: Diagnosis not present

## 2022-01-25 DIAGNOSIS — K219 Gastro-esophageal reflux disease without esophagitis: Secondary | ICD-10-CM | POA: Diagnosis not present

## 2022-01-25 DIAGNOSIS — H409 Unspecified glaucoma: Secondary | ICD-10-CM | POA: Diagnosis not present

## 2022-01-25 DIAGNOSIS — F330 Major depressive disorder, recurrent, mild: Secondary | ICD-10-CM | POA: Diagnosis not present

## 2022-01-25 DIAGNOSIS — I10 Essential (primary) hypertension: Secondary | ICD-10-CM | POA: Diagnosis not present

## 2022-01-25 DIAGNOSIS — N3941 Urge incontinence: Secondary | ICD-10-CM | POA: Diagnosis not present

## 2022-01-25 DIAGNOSIS — E890 Postprocedural hypothyroidism: Secondary | ICD-10-CM | POA: Diagnosis not present

## 2022-01-25 DIAGNOSIS — Z823 Family history of stroke: Secondary | ICD-10-CM | POA: Diagnosis not present

## 2022-01-26 ENCOUNTER — Ambulatory Visit
Admission: RE | Admit: 2022-01-26 | Discharge: 2022-01-26 | Disposition: A | Discharge status: Home / Self Care | Payer: Medicare HMO | Source: Ambulatory Visit | Attending: Physician Assistant | Admitting: Physician Assistant

## 2022-01-26 DIAGNOSIS — Z1231 Encounter for screening mammogram for malignant neoplasm of breast: Secondary | ICD-10-CM | POA: Diagnosis not present

## 2022-01-30 ENCOUNTER — Ambulatory Visit
Admission: RE | Admit: 2022-01-30 | Discharge: 2022-01-30 | Disposition: A | Discharge status: Home / Self Care | Payer: Medicare HMO | Source: Ambulatory Visit | Attending: Nurse Practitioner | Admitting: Nurse Practitioner

## 2022-01-30 ENCOUNTER — Encounter: Payer: Self-pay | Admitting: Physician Assistant

## 2022-01-30 ENCOUNTER — Other Ambulatory Visit: Payer: Self-pay | Admitting: Nurse Practitioner

## 2022-01-30 DIAGNOSIS — N6320 Unspecified lump in the left breast, unspecified quadrant: Secondary | ICD-10-CM

## 2022-01-30 DIAGNOSIS — N6310 Unspecified lump in the right breast, unspecified quadrant: Secondary | ICD-10-CM

## 2022-01-30 DIAGNOSIS — R92341 Mammographic extreme density, right breast: Secondary | ICD-10-CM | POA: Diagnosis not present

## 2022-01-30 DIAGNOSIS — R92331 Mammographic heterogeneous density, right breast: Secondary | ICD-10-CM | POA: Diagnosis not present

## 2022-01-30 DIAGNOSIS — N6489 Other specified disorders of breast: Secondary | ICD-10-CM | POA: Diagnosis not present

## 2022-01-31 NOTE — Progress Notes (Signed)
Negative mammogram. Repeat in one year

## 2022-01-31 NOTE — Progress Notes (Signed)
Negative right breast ultrasound. Repeat screening mammogram in one year.

## 2022-02-13 DIAGNOSIS — K0381 Cracked tooth: Secondary | ICD-10-CM | POA: Diagnosis not present

## 2022-04-09 DIAGNOSIS — H401132 Primary open-angle glaucoma, bilateral, moderate stage: Secondary | ICD-10-CM | POA: Diagnosis not present

## 2022-04-09 DIAGNOSIS — H401133 Primary open-angle glaucoma, bilateral, severe stage: Secondary | ICD-10-CM | POA: Diagnosis not present

## 2022-04-12 ENCOUNTER — Ambulatory Visit: Payer: Medicare HMO | Attending: Internal Medicine | Admitting: Internal Medicine

## 2022-04-12 ENCOUNTER — Encounter: Payer: Self-pay | Admitting: Internal Medicine

## 2022-04-12 ENCOUNTER — Other Ambulatory Visit
Admission: RE | Admit: 2022-04-12 | Discharge: 2022-04-12 | Disposition: A | Discharge status: Home / Self Care | Payer: Medicare HMO | Source: Ambulatory Visit | Attending: Internal Medicine | Admitting: Internal Medicine

## 2022-04-12 ENCOUNTER — Other Ambulatory Visit: Payer: Self-pay | Admitting: Internal Medicine

## 2022-04-12 VITALS — BP 110/62 | HR 87 | Ht 61.0 in | Wt 119.0 lb

## 2022-04-12 DIAGNOSIS — I739 Peripheral vascular disease, unspecified: Secondary | ICD-10-CM | POA: Diagnosis not present

## 2022-04-12 DIAGNOSIS — I25118 Atherosclerotic heart disease of native coronary artery with other forms of angina pectoris: Secondary | ICD-10-CM

## 2022-04-12 DIAGNOSIS — E785 Hyperlipidemia, unspecified: Secondary | ICD-10-CM

## 2022-04-12 DIAGNOSIS — R42 Dizziness and giddiness: Secondary | ICD-10-CM | POA: Diagnosis not present

## 2022-04-12 LAB — COMPREHENSIVE METABOLIC PANEL
ALT: 20 U/L (ref 0–44)
AST: 25 U/L (ref 15–41)
Albumin: 4.3 g/dL (ref 3.5–5.0)
Alkaline Phosphatase: 81 U/L (ref 38–126)
Anion gap: 9 (ref 5–15)
BUN: 21 mg/dL (ref 8–23)
CO2: 26 mmol/L (ref 22–32)
Calcium: 9.5 mg/dL (ref 8.9–10.3)
Chloride: 99 mmol/L (ref 98–111)
Creatinine, Ser: 0.89 mg/dL (ref 0.44–1.00)
GFR, Estimated: >60 mL/min (ref >60)
Glucose, Bld: 102 mg/dL — ABNORMAL HIGH (ref 70–99)
Potassium: 4.1 mmol/L (ref 3.5–5.1)
Sodium: 134 mmol/L — ABNORMAL LOW (ref 135–145)
Total Bilirubin: 0.7 mg/dL (ref 0.3–1.2)
Total Protein: 7.2 g/dL (ref 6.5–8.1)

## 2022-04-12 LAB — CBC
HCT: 43.7 % (ref 36.0–46.0)
Hemoglobin: 14.4 g/dL (ref 12.0–15.0)
MCH: 29.9 pg (ref 26.0–34.0)
MCHC: 33 g/dL (ref 30.0–36.0)
MCV: 90.9 fL (ref 80.0–100.0)
Platelets: 227 10*3/uL (ref 150–400)
RBC: 4.81 MIL/uL (ref 3.87–5.11)
RDW: 11.9 % (ref 11.5–15.5)
WBC: 6.5 10*3/uL (ref 4.0–10.5)
nRBC: 0 % (ref 0.0–0.2)

## 2022-04-12 LAB — TSH: TSH: 1.739 u[IU]/mL (ref 0.350–4.500)

## 2022-04-12 LAB — LIPID PANEL
Cholesterol: 141 mg/dL (ref 0–200)
HDL: 57 mg/dL (ref >40)
LDL Cholesterol: 63 mg/dL (ref 0–99)
Total CHOL/HDL Ratio: 2.5 RATIO
Triglycerides: 105 mg/dL (ref <150)
VLDL: 21 mg/dL (ref 0–40)

## 2022-04-12 NOTE — Patient Instructions (Signed)
Medication Instructions:  No changes *If you need a refill on your cardiac medications before your next appointment, please call your pharmacy*   Lab Work: Your provider would like for you to have following labs drawn: CBC, CMET, TSH and Lipid.   Please go to the [facility] entrance and check in at the front desk.  You do not need an appointment.  They are open from 7am-6 pm.   If you have labs (blood work) drawn today and your tests are completely normal, you will receive your results only by: [place] (if you have MyChart) OR A paper copy in the mail If you have any lab test that is abnormal or we need to change your treatment, we will call you to review the results.   Testing/Procedures: None ordered   Follow-Up: At [facility], you and your health needs are our priority.  As part of our continuing mission to provide you with exceptional heart care, we have created designated Provider Care Teams.  These Care Teams include your primary Cardiologist (physician) and Advanced Practice Providers (APPs -  Physician Assistants and Nurse Practitioners) who all work together to provide you with the care you need, when you need it.  We recommend signing up for the patient portal called "MyChart".  Sign up information is provided on this After Visit Summary.  MyChart is used to connect with patients for Virtual Visits (Telemedicine).  Patients are able to view lab/test results, encounter notes, upcoming appointments, etc.  Non-urgent messages can be sent to your provider as well.   To learn more about what you can do with MyChart, go to [email or web address].    Your next appointment:   6 month(s)  Provider:   You may see Nelva Bush, MD or one of the following Advanced Practice Providers on your designated Care Team:   Murray Hodgkins, NP Christell Faith, PA-C Cadence Kathlen Mody, PA-C Gerrie Nordmann, NP    Other Instructions Please follow up with Dr. Trula Slade.

## 2022-04-12 NOTE — Progress Notes (Signed)
Follow-up Outpatient Visit Date: 04/12/2022  Primary Care Provider: Lorrene Reid, PA-C No address on file  Chief Complaint: Dizziness  HPI:  [name] is a 77 y.o. female with history of nonobstructive coronary artery disease, peripheral vascular disease with right vertebral artery occlusion and left subclavian artery stenosis status post stenting (12/2016) followed by Dr. Trula Slade, stroke, hyperlipidemia, hypothyroidism, and GERD, who presents for follow-up of coronary artery disease.  I last saw her a year ago, which time she was doing well other than mild exertional dyspnea that she attributed to deconditioning.  She was dealing with some GI issues attributed to gastritis.  Today, [name] reports that she has been feeling fairly well, though she reports 3 episodes of lightheadedness and feeling off balance over the last 3 to 4 weeks.  There were no clear precipitants, with the symptoms lasting about an hour.  Most recent episode occurred yesterday.  She checked her blood pressure and noted it was 126/78 to 142/80.  She felt a little weak all over, almost as if "control was taken away" from her.  She did not have any focal weakness or other neurologic changes.  She felt back to normal today.  She has not had any chest pain, shortness of breath, palpitations, or edema.  She has been experiencing some right-sided neck and shoulder pain that is positional.  --------------------------------------------------------------------------------------------------  Cardiovascular History & Procedures: Cardiovascular Problems: Peripheral vascular disease Stroke Stable angina with nonobstructive coronary artery disease   Risk Factors: Peripheral and cerebrovascular disease, hyperlipidemia, and age greater than 51   Cath/PCI: LHC (04/19/17): LMCA normal. LAD with sequential 50% and 25% lesions (FFR 0.92). Ramus with minimal disease. LCx normal. RCA with mild diffuse disease. LVEDP 17 mmHg. LVEF  55-65%.   CV Surgery: Aortography and left subclavian angiography; intervention (01/01/17, Dr. Trula Slade): Occluded right vertebral artery. 80% left subclavian artery stenosis with successful intervention with Xpress 8 x 25 mm balloon-expandable stent.   EP Procedures and Devices: 30-day event monitor (11/28/16): Sinus rhythm. No significant arrhythmia.   Non-Invasive Evaluation(s): Carotid Doppler (04/17/2021): Mild bilateral internal carotid artery stenoses (1-39%).  Chronic occlusion of right vertebral artery.  Left vertebral artery demonstrates antegrade flow.  Normal bilateral subclavian artery flow hemodynamics. Carotid Doppler (04/04/2020): 1-39% stenosis in both internal carotid arteries.  Abselen right vertebral artery flow.  Normal bilateral subclavian artery flow hemodynamics. Cardiac CTA (12/17/2019): LMCA normal.  LAD with calcified plaque and 25-49% stenosis involving the proximal and mid segments.  Ramus intermedius with less than 25% calcified stenosis of the mid vessel.  LCx with less than 25% stenosis involving the mid vessel.  Normal RCA.  Coronary calcium score 302 (79th percentile).  Incidental note made of clustered area of tree-in-bud nodularity in the superior segment of the left lower lobe.  Findings most likely reflect inflammatory/infectious bronchiolitis. Carotid Doppler (02/17/18): 1-39% stenosis of both internal carotid arteries.  Right vertebral artery is occluded.  Left vertebral artery shows antegrade flow.  No significant subclavian artery stenosis bilaterally. Exercise MPI (11/22/16): Low risk study with small in size, mild in severity, fixed apical anterior and apical defect likely representing apical thinning. LVEF >65% with normal wall motion. Patient complained of fatigue during test, exercising 5:30 minutes. Upper extremity arterial Doppler (10/26/16): Right upper extremity evaluation normal. Left upper extremity demonstrates dampened biphasic to monophasic waveforms and  20 mmHg pressure difference compared to the right. Carotid artery duplex (10/18/16): Bilateral 1-39% stenoses involving the right and left internal carotid arteries. Transthoracic echocardiogram (12/28/14):  Normal LV size and function. LVEF 60-65% with grade 1 diastolic dysfunction. Normal RV size and function. No significant vascular abnormalities.  Recent CV Pertinent Labs: Lab Results  Component Value Date   CHOL 142 10/12/2020   HDL 58 10/12/2020   LDLCALC 70 10/12/2020   TRIG 72 10/12/2020   CHOLHDL 2.4 10/12/2020   CHOLHDL 3.9 12/27/2014   INR 1.0 04/12/2017   K 5.3 (H) 02/28/2021   BUN 19 02/28/2021   CREATININE 0.77 02/28/2021    Past medical and surgical history were reviewed and updated in EPIC.  Current Meds  Medication Sig   acetaminophen (TYLENOL) 500 MG tablet Take 500 mg by mouth daily as needed for moderate pain or headache.    Calcium Citrate-Vitamin D (CALCIUM CITRATE + PO) Take by mouth daily at 12 noon.   clopidogrel (PLAVIX) 75 MG tablet TAKE 1 TABLET BY MOUTH EVERY DAY   diazepam (VALIUM) 5 MG tablet Take 1 tablet (5 mg total) by mouth at bedtime as needed for anxiety (sleep).   dorzolamide-timolol (COSOPT) 22.3-6.8 MG/ML ophthalmic solution Place into both eyes daily.   isosorbide mononitrate (IMDUR) 30 MG 24 hr tablet Take 0.5 tablets (15 mg total) by mouth daily. PLEASE SCHEDULE OFFICE VISIT FOR FURTHER REFILLS. THANK YOU!   latanoprost (XALATAN) 0.005 % ophthalmic solution 1 drop at bedtime.   levothyroxine (SYNTHROID) 75 MCG tablet Take 75 mcg by mouth every morning.   liothyronine (CYTOMEL) 5 MCG tablet Take 5 mcg by mouth daily.   Lutein 6 MG TABS Takes four times a week   Multiple Vitamin (MULTIVITAMIN) tablet Take 1 tablet by mouth daily.   pantoprazole (PROTONIX) 40 MG tablet Take 1 tablet (40 mg total) by mouth daily. (Patient taking differently: Take 40 mg by mouth as needed.)   rosuvastatin (CRESTOR) 40 MG tablet TAKE 1 TABLET BY MOUTH EVERY DAY     Allergies: Neuromuscular blocking agents, Atorvastatin, and Morphine and related  Social History   Tobacco Use   Smoking status: Former    Packs/day: 0.25    Types: Cigarettes    Quit date: 03/12/1978    Years since quitting: 44.1   Smokeless tobacco: Never   Tobacco comments:    quit in 1978  Vaping Use   Vaping Use: Never used  Substance Use Topics   Alcohol use: Yes    Alcohol/week: 7.0 standard drinks of alcohol    Types: 7 Glasses of wine per week    Comment: 1 glass of wine every evening   Drug use: No    Family History  Problem Relation Age of Onset   Heart failure Mother 40   Heart murmur Mother    Other Father 30       SEPSIS   Hypertension Father    Stroke Father 47       CVA   Diabetes Sister    Heart failure Sister    Breast cancer Sister    Stroke Sister    Heart attack Sister 41   Hypertension Sister    Diabetes Brother    Hypertension Brother    Heart disease Brother    Cirrhosis Brother    Heart murmur Daughter    Breast cancer Maternal Aunt    Breast cancer Paternal Aunt    Breast cancer Cousin    Breast cancer Cousin    Breast cancer Cousin    Breast cancer Cousin    Stomach cancer Neg Hx    Colon cancer Neg Hx  Esophageal cancer Neg Hx    Pancreatic cancer Neg Hx     Review of Systems: A 12-system review of systems was performed and was negative except as noted in the HPI.  --------------------------------------------------------------------------------------------------  Physical Exam: BP 110/62 (BP Location: Left Arm, Patient Position: Sitting, Cuff Size: Normal)   Pulse 87   Ht '5\' 1"'$  (1.549 m)   Wt 119 lb (54 kg)   SpO2 96%   BMI 22.48 kg/m   General:  NAD. Neck: No JVD or HJR.  No carotid bruit. Lungs: Clear to auscultation bilaterally without wheezes or crackles. Heart: Regular rate and rhythm without murmurs, rubs, or gallops. Abdomen: Soft, nontender, nondistended. Extremities: No lower extremity edema.  EKG:  Normal sinus rhythm with low voltage and septal infarct.  No significant change from prior tracing on 03/29/2021.  Lab Results  Component Value Date   WBC 5.5 02/28/2021   HGB 15.0 02/28/2021   HCT 45.4 02/28/2021   MCV 93 02/28/2021   PLT 232 02/28/2021    Lab Results  Component Value Date   NA 137 02/28/2021   K 5.3 (H) 02/28/2021   CL 100 02/28/2021   CO2 25 02/28/2021   BUN 19 02/28/2021   CREATININE 0.77 02/28/2021   GLUCOSE 95 02/28/2021   ALT 23 02/28/2021    Lab Results  Component Value Date   CHOL 142 10/12/2020   HDL 58 10/12/2020   LDLCALC 70 10/12/2020   TRIG 72 10/12/2020   CHOLHDL 2.4 10/12/2020    --------------------------------------------------------------------------------------------------  ASSESSMENT AND PLAN: Coronary artery disease with stable angina: No angina reported.  Vague lightheadedness and disequilibrium are unlikely to reflect anginal equivalent.  Continue clopidogrel and statin therapy for secondary prevention as well as isosorbide mononitrate for antianginal therapy.  Repeat lipid panel today to ensure LDL remains at goal.  Peripheral arterial disease: Given vague lightheadedness in the setting of known right vertebral artery occlusion as well as history of left subclavian artery stenosis status post stenting, I recommended that [name] reach out to Dr. Stephens [facility] office to schedule her annual follow-up at her earliest convenience, including repeat carotid Dopplers.  Continue clopidogrel and high intensity statin therapy for secondary prevention.  Lightheadedness: Blood pressure low normal today but typically better at home, including during episodes of lightheadedness.  No significant orthostatic changes today.  Given vague and relatively infrequent symptoms, we have agreed to check a CBC, CMP, and TSH today.  I have also encouraged [name] to follow-up with Dr. Trula Slade at her earliest convenience.  I encouraged her to stay well-hydrated  and to alert [place] and/or her PCP if her symptoms worsen.  Hyperlipidemia: LDL borderline on last check in 10/2020.  We will repeat a CMP and lipid panel today with plans to continue rosuvastatin 40 mg daily as long as LDL is at goal (less than 70).  Follow-up: Return to clinic in 6 months.  Nelva Bush, MD 04/12/2022 2:03 PM

## 2022-04-13 DIAGNOSIS — R42 Dizziness and giddiness: Secondary | ICD-10-CM | POA: Insufficient documentation

## 2022-04-17 ENCOUNTER — Other Ambulatory Visit: Payer: Self-pay | Admitting: *Deleted

## 2022-04-17 DIAGNOSIS — I6529 Occlusion and stenosis of unspecified carotid artery: Secondary | ICD-10-CM

## 2022-04-23 ENCOUNTER — Ambulatory Visit: Payer: Medicare HMO | Admitting: Physician Assistant

## 2022-04-23 ENCOUNTER — Ambulatory Visit (HOSPITAL_COMMUNITY)
Admission: RE | Admit: 2022-04-23 | Discharge: 2022-04-23 | Disposition: A | Discharge status: Home / Self Care | Payer: Medicare HMO | Source: Ambulatory Visit | Attending: Surgery | Admitting: Surgery

## 2022-04-23 VITALS — BP 134/73 | HR 58 | Temp 97.6°F | Resp 20 | Ht 61.0 in | Wt 119.6 lb

## 2022-04-23 DIAGNOSIS — I771 Stricture of artery: Secondary | ICD-10-CM | POA: Diagnosis not present

## 2022-04-23 DIAGNOSIS — I6529 Occlusion and stenosis of unspecified carotid artery: Secondary | ICD-10-CM | POA: Diagnosis not present

## 2022-04-23 NOTE — Progress Notes (Unsigned)
Office Note   History of Present Illness   [name] is a 77 y.o. (06/11/1945) female who presents for surveillance of left subclavian artery stenosis.  She was initially seen by Dr. Trula Slade in 2018 for asymmetric blood pressures, and she eventually underwent stenting of the left subclavian artery on 03/03/2017.  She does have a history of stroke in October 2016 secondary to known right vertebral artery occlusion.  The patient is here for follow-up, and states that she is doing okay.  She mentions that 2 weeks ago she had somewhere between 3-4 episodes of sudden dizziness and vision blurriness.  She describes the dizziness as global and vision "fuzziness" equal in both eyes.  She denies any specific precipitating events.  She typically had to wait about an hour for her symptoms to go away.  During most of these events, she had to keep her self still and not make any sudden movements so she would feel better.  She denies any focal weakness or other neurologic changes.  She denies any chest pain, shortness of breath.   Current Outpatient Medications  Medication Sig Dispense Refill   acetaminophen (TYLENOL) 500 MG tablet Take 500 mg by mouth daily as needed for moderate pain or headache.      Calcium Citrate-Vitamin D (CALCIUM CITRATE + PO) Take by mouth daily at 12 noon.     diazepam (VALIUM) 5 MG tablet Take 1 tablet (5 mg total) by mouth at bedtime as needed for anxiety (sleep). 30 tablet 0   dorzolamide-timolol (COSOPT) 22.3-6.8 MG/ML ophthalmic solution Place into both eyes daily.     isosorbide mononitrate (IMDUR) 30 MG 24 hr tablet Take 0.5 tablets (15 mg total) by mouth daily. PLEASE SCHEDULE OFFICE VISIT FOR FURTHER REFILLS. THANK YOU! 45 tablet 0   latanoprost (XALATAN) 0.005 % ophthalmic solution 1 drop at bedtime.     levothyroxine (SYNTHROID) 75 MCG tablet Take 75 mcg by mouth every morning.     liothyronine (CYTOMEL) 5 MCG tablet Take 5 mcg by mouth daily.     Lutein 6 MG TABS  Takes four times a week     Multiple Vitamin (MULTIVITAMIN) tablet Take 1 tablet by mouth daily.     pantoprazole (PROTONIX) 40 MG tablet Take 1 tablet (40 mg total) by mouth daily. (Patient taking differently: Take 40 mg by mouth as needed.) 90 tablet 3   rosuvastatin (CRESTOR) 40 MG tablet TAKE 1 TABLET BY MOUTH EVERY DAY 90 tablet 2   clopidogrel (PLAVIX) 75 MG tablet Take 1 tablet (75 mg total) by mouth daily. 90 tablet 0   No current facility-administered medications for this visit.    REVIEW OF SYSTEMS (negative unless checked):   Cardiac:  []$  Chest pain or chest pressure? []$  Shortness of breath upon activity? []$  Shortness of breath when lying flat? []$  Irregular heart rhythm?  Vascular:  []$  Pain in calf, thigh, or hip brought on by walking? []$  Pain in feet at night that wakes you up from your sleep? []$  Blood clot in your veins? []$  Leg swelling?  Pulmonary:  []$  Oxygen at home? []$  Productive cough? []$  Wheezing?  Neurologic:  []$  Sudden weakness in arms or legs? []$  Sudden numbness in arms or legs? []$  Sudden onset of difficult speaking or slurred speech? []$  Temporary loss of vision in one eye? [x]$  Problems with dizziness?  Gastrointestinal:  []$  Blood in stool? []$  Vomited blood?  Genitourinary:  []$  Burning when urinating? []$  Blood in urine?  Psychiatric:  []$   Major depression  Hematologic:  []$  Bleeding problems? []$  Problems with blood clotting?  Dermatologic:  []$  Rashes or ulcers?  Constitutional:  []$  Fever or chills?  Ear/Nose/Throat:  []$  Change in hearing? []$  Nose bleeds? []$  Sore throat?  Musculoskeletal:  []$  Back pain? []$  Joint pain? []$  Muscle pain?   Physical Examination   Vitals:   04/23/22 1458 04/23/22 1502  BP: 134/75 134/73  Pulse: (!) 58   Resp: 20   Temp: 97.6 F (36.4 C)   TempSrc: Temporal   SpO2: 98%   Weight: 119 lb 9.6 oz (54.3 kg)   Height: 5' 1"$  (1.549 m)    Body mass index is 22.6 kg/m.  General:  WDWN in NAD; vital  signs documented above Gait: Not observed HENT: WNL, normocephalic Pulmonary: normal non-labored breathing  Cardiac: Regular rate and rhythm, no carotid bruit Abdomen: soft, NT, no masses Skin: without rashes Vascular Exam/Pulses: Palpable and equal radial pulses bilaterally 2+ Extremities: Without ischemic changes, gangrene, open wounds Musculoskeletal: no muscle wasting or atrophy  Neurologic: A&O X 3;  No focal weakness or paresthesias are detected Psychiatric:  The pt has Normal affect.  Non-Invasive Vascular Imaging   B Carotid Duplex (04/23/2022):  R ICA stenosis:  1-39% R VA: chronically occluded L ICA stenosis:  1-39% L VA:  patent and antegrade L Subclavian: Patent stent   Medical Decision Making   [name] is a 77 y.o. female who presents for surveillance of left subclavian artery stenosis  Based on the patient's vascular studies, her left subclavian artery stent is patent without signs of stenosis or elevated velocities.  Carotid duplex also demonstrates 1 to 39% ICA stenosis bilaterally She has equal and palpable radial pulses 2+ She experienced 3-4 episodes of global dizziness and blurry vision bilaterally a couple of weeks ago.  There were no clear precipitating factors.  She believes that her symptoms were positional, and her symptoms typically improved after staying still for about an hour.  She denied any other neurological symptoms The patient's subclavian stent is patent and she has mild carotid artery stenosis bilaterally, which I do not believe is the cause for her dizziness episodes.  These could be episodes of vertigo, vasovagal syncope, etc. I have told the patient to monitor her symptoms and let her PCP know if they come up again She can follow-up with our office in 1 year with repeat carotid and subclavian duplex study   Vicente Serene PA-C [facility]: [facility] MD: Trula Slade

## 2022-04-24 ENCOUNTER — Other Ambulatory Visit: Payer: Self-pay

## 2022-04-24 MED ORDER — CLOPIDOGREL BISULFATE 75 MG PO TABS: 75.0000 mg | ORAL_TABLET | Freq: Every day | ORAL | 0 refills | Status: DC

## 2022-04-24 NOTE — Telephone Encounter (Signed)
L.O.V: 12/25/21  N.O.V: 07/02/22  L.R.F: 08/29/21 Clopidogrel 90 tab 1 refill

## 2022-05-01 ENCOUNTER — Telehealth: Payer: Self-pay | Admitting: Internal Medicine

## 2022-05-01 MED ORDER — ISOSORBIDE MONONITRATE ER 30 MG PO TB24: 15.0000 mg | ORAL_TABLET | Freq: Every day | ORAL | 3 refills | Status: DC

## 2022-05-01 NOTE — Telephone Encounter (Signed)
*  STAT* If patient is at the pharmacy, call can be transferred to refill team.   1. Which medications need to be refilled? (please list name of each medication and dose if known)  isosorbide mononitrate (IMDUR) 30 MG 24 hr tablet  2. Which pharmacy/location (including street and city if local pharmacy) is medication to be sent to?  [id number] , [place] - [address]  3. Do they need a 30 day or 90 day supply?  90 day supply  Patient states she is completely out of medication.

## 2022-05-11 NOTE — Progress Notes (Unsigned)
   Acute Office Visit  Subjective:     Patient ID: [name], female    DOB: February 23, 1946, 77 y.o.   MRN: [id number]  No chief complaint on file.   HPI Patient is in today for ***  ROS      Objective:    There were no vitals taken for this visit.  Physical Exam  No results found for any visits on 05/14/22.      Assessment & Plan:  There are no diagnoses linked to this encounter.  No follow-ups on file.  Velva Harman, PA

## 2022-05-14 ENCOUNTER — Encounter: Payer: Self-pay | Admitting: Family Medicine

## 2022-05-14 ENCOUNTER — Ambulatory Visit (INDEPENDENT_AMBULATORY_CARE_PROVIDER_SITE_OTHER): Payer: Medicare HMO | Admitting: Family Medicine

## 2022-05-14 VITALS — BP 124/70 | HR 63 | Resp 18 | Ht 61.0 in | Wt 121.3 lb

## 2022-05-14 DIAGNOSIS — F419 Anxiety disorder, unspecified: Secondary | ICD-10-CM

## 2022-05-14 DIAGNOSIS — M533 Sacrococcygeal disorders, not elsewhere classified: Secondary | ICD-10-CM | POA: Diagnosis not present

## 2022-05-14 DIAGNOSIS — M8580 Other specified disorders of bone density and structure, unspecified site: Secondary | ICD-10-CM | POA: Diagnosis not present

## 2022-05-14 DIAGNOSIS — R69 Illness, unspecified: Secondary | ICD-10-CM | POA: Diagnosis not present

## 2022-05-14 DIAGNOSIS — M25552 Pain in left hip: Secondary | ICD-10-CM

## 2022-05-14 DIAGNOSIS — I25118 Atherosclerotic heart disease of native coronary artery with other forms of angina pectoris: Secondary | ICD-10-CM

## 2022-05-14 DIAGNOSIS — R102 Pelvic and perineal pain: Secondary | ICD-10-CM | POA: Diagnosis not present

## 2022-05-14 MED ORDER — DIAZEPAM 5 MG PO TABS: 5.0000 mg | ORAL_TABLET | Freq: Every evening | ORAL | 0 refills | Status: DC | PRN

## 2022-05-14 NOTE — Assessment & Plan Note (Addendum)
Followed by cardiology, reviewed most recent cardiology note.  Continue current medication regimen, will continue to monitor in conjunction with cardiology.

## 2022-05-14 NOTE — Assessment & Plan Note (Signed)
Currently takes calcium citrate-vitamin D supplement daily.

## 2022-05-16 ENCOUNTER — Ambulatory Visit
Admission: RE | Admit: 2022-05-16 | Discharge: 2022-05-16 | Disposition: A | Discharge status: Home / Self Care | Payer: Medicare HMO | Source: Ambulatory Visit | Attending: Family Medicine | Admitting: Family Medicine

## 2022-05-16 DIAGNOSIS — M545 Low back pain, unspecified: Secondary | ICD-10-CM | POA: Diagnosis not present

## 2022-05-16 DIAGNOSIS — M25552 Pain in left hip: Secondary | ICD-10-CM

## 2022-05-16 DIAGNOSIS — R102 Pelvic and perineal pain: Secondary | ICD-10-CM | POA: Diagnosis not present

## 2022-05-16 DIAGNOSIS — M47816 Spondylosis without myelopathy or radiculopathy, lumbar region: Secondary | ICD-10-CM | POA: Diagnosis not present

## 2022-05-16 NOTE — Progress Notes (Unsigned)
[facility] Return Visit  SUBJECTIVE  History of Present Illness: [name] is a 77 y.o. female seen in follow-up for OAB and UUI. Plan at last visit was take pumpkin seed oil and reduce bladder irritants. Has been taking pumpkin seed extract x3 weekly.   Going to the bathroom >10 times per day.  Gets up 1 time nightly.   Increased urgency and frequency:  Feels like she does empty  Denies UTI symptoms.   UUI> SUI  Past Medical History: Patient  has a past medical history of Anxiety, Barrett esophagus (2003), Diverticulitis, Diverticulosis, GERD (gastroesophageal reflux disease), Glaucoma, Grave's disease, H. pylori infection, Hepatitis A, Hyperlipidemia, Hypothyroidism, Increased pressure in the eye, Insomnia, Morton's neuroma, Murmur, Scoliosis, and Stroke ([place]) (12/2014).   Past Surgical History: She  has a past surgical history that includes Laparoscopic assisted vaginal hysterectomy; Laparoscopic sigmoid colectomy (05/13/06); Femur Surgery (Right); Lumbar disc surgery (2009); Appendectomy (1958); Eye surgery; Tubal ligation; Cervical laminectomy (1995); Foot neuroma surgery (Right); Tonsillectomy (1992); Cataract extraction w/PHACO (Left, 07/02/2016); Cataract extraction (Left); AORTIC ARCH ANGIOGRAPHY (N/A, 01/01/2017); Upper Extremity Angiography (Left, 01/01/2017); PERIPHERAL VASCULAR INTERVENTION (Left, 01/01/2017); Subclavian stent placement; LEFT HEART CATH AND CORONARY ANGIOGRAPHY (N/A, 04/19/2017); INTRAVASCULAR PRESSURE WIRE/FFR STUDY (N/A, 04/19/2017); Colon surgery; Cataract extraction w/PHACO (Right, 02/12/2018); Cataract extraction (02/12/2018); and Trigger finger release (Right, 05/26/2018).   Medications: She has a current medication list which includes the following prescription(s): acetaminophen, [employer], calcium citrate-vitamin d, clopidogrel, diazepam, dorzolamide-timolol, isosorbide mononitrate, latanoprost, levothyroxine, liothyronine, lutein,  multivitamin, pantoprazole, and rosuvastatin.   Allergies: Patient is allergic to neuromuscular blocking agents, atorvastatin, and morphine and related.   Social History: Patient  reports that she quit smoking about 44 years ago. Her smoking use included cigarettes. She smoked an average of .25 packs per day. She has never been exposed to tobacco smoke. She has never used smokeless tobacco. She reports current alcohol use of about 7.0 standard drinks of alcohol per week. She reports that she does not use drugs.      OBJECTIVE     Physical Exam: Vitals:   05/17/22 0856  BP: 122/60  Pulse: 67   Gen: No apparent distress, A&O x 3.  Detailed Urogynecologic Evaluation:  Deferred. Prior exam showed:    ASSESSMENT AND PLAN    [name] is a 77 y.o. with:  1. OAB (overactive bladder)    Care pathway discussed today with patient. She has not previously tried any medications and is not a good candidate for anticholinergic medications based on her age and high risk factors for memory impairment. Patient was given samples of Myrbetriq '25mg'$  to trial and prescription sent to pharmacy, will attempt to get prior auth or tier reduction as necessary.  Other treatment options discussed, including PTNS, pelvic floor PT, bladder botox and SNM. Patient reports some interest in bladder botox but is aware we need to try a medication first.   Patient to follow up in 6 weeks or sooner if needed.

## 2022-05-17 ENCOUNTER — Ambulatory Visit: Payer: Medicare HMO | Admitting: Obstetrics and Gynecology

## 2022-05-17 ENCOUNTER — Encounter: Payer: Self-pay | Admitting: Obstetrics and Gynecology

## 2022-05-17 VITALS — BP 122/60 | HR 67

## 2022-05-17 DIAGNOSIS — N3281 Overactive bladder: Secondary | ICD-10-CM | POA: Diagnosis not present

## 2022-05-17 MED ORDER — MIRABEGRON ER 25 MG PO TB24: 25.0000 mg | ORAL_TABLET | Freq: Every day | ORAL | 1 refills | Status: DC

## 2022-05-17 NOTE — Patient Instructions (Signed)
Start Myrbetriq '25mg'$  daily

## 2022-05-21 ENCOUNTER — Encounter: Payer: Self-pay | Admitting: Family Medicine

## 2022-05-21 ENCOUNTER — Other Ambulatory Visit: Payer: Self-pay | Admitting: Family Medicine

## 2022-05-21 DIAGNOSIS — R29898 Other symptoms and signs involving the musculoskeletal system: Secondary | ICD-10-CM

## 2022-05-21 DIAGNOSIS — M25552 Pain in left hip: Secondary | ICD-10-CM

## 2022-05-21 DIAGNOSIS — M5416 Radiculopathy, lumbar region: Secondary | ICD-10-CM

## 2022-06-06 ENCOUNTER — Ambulatory Visit
Admission: RE | Admit: 2022-06-06 | Discharge: 2022-06-06 | Disposition: A | Discharge status: Home / Self Care | Payer: Medicare HMO | Source: Ambulatory Visit | Attending: Family Medicine | Admitting: Family Medicine

## 2022-06-06 DIAGNOSIS — M48061 Spinal stenosis, lumbar region without neurogenic claudication: Secondary | ICD-10-CM | POA: Diagnosis not present

## 2022-06-06 DIAGNOSIS — M5136 Other intervertebral disc degeneration, lumbar region: Secondary | ICD-10-CM | POA: Diagnosis not present

## 2022-06-06 DIAGNOSIS — M5416 Radiculopathy, lumbar region: Secondary | ICD-10-CM

## 2022-06-06 DIAGNOSIS — R29898 Other symptoms and signs involving the musculoskeletal system: Secondary | ICD-10-CM

## 2022-06-06 DIAGNOSIS — M25552 Pain in left hip: Secondary | ICD-10-CM

## 2022-06-06 MED ORDER — GADOPICLENOL 0.5 MMOL/ML IV SOLN
5.0000 mL | Freq: Once | INTRAVENOUS | Status: AC | PRN
  Administered 2022-06-06: 5 mL via INTRAVENOUS

## 2022-06-15 ENCOUNTER — Other Ambulatory Visit: Payer: Self-pay | Admitting: Family Medicine

## 2022-06-15 DIAGNOSIS — R7303 Prediabetes: Secondary | ICD-10-CM

## 2022-06-15 DIAGNOSIS — I959 Hypotension, unspecified: Secondary | ICD-10-CM

## 2022-06-26 ENCOUNTER — Other Ambulatory Visit: Payer: Medicare HMO

## 2022-06-26 DIAGNOSIS — I959 Hypotension, unspecified: Secondary | ICD-10-CM

## 2022-06-26 DIAGNOSIS — R7303 Prediabetes: Secondary | ICD-10-CM | POA: Diagnosis not present

## 2022-06-27 ENCOUNTER — Telehealth: Payer: Self-pay

## 2022-06-27 DIAGNOSIS — M5416 Radiculopathy, lumbar region: Secondary | ICD-10-CM

## 2022-06-27 LAB — COMPREHENSIVE METABOLIC PANEL
ALT: 18 IU/L (ref 0–32)
AST: 25 IU/L (ref 0–40)
Albumin/Globulin Ratio: 1.8 (ref 1.2–2.2)
Albumin: 4.7 g/dL (ref 3.8–4.8)
Alkaline Phosphatase: 112 IU/L (ref 44–121)
BUN/Creatinine Ratio: 18 (ref 12–28)
BUN: 13 mg/dL (ref 8–27)
Bilirubin Total: 0.5 mg/dL (ref 0.0–1.2)
CO2: 23 mmol/L (ref 20–29)
Calcium: 9.9 mg/dL (ref 8.7–10.3)
Chloride: 102 mmol/L (ref 96–106)
Creatinine, Ser: 0.73 mg/dL (ref 0.57–1.00)
Globulin, Total: 2.6 g/dL (ref 1.5–4.5)
Glucose: 93 mg/dL (ref 70–99)
Potassium: 4.7 mmol/L (ref 3.5–5.2)
Sodium: 139 mmol/L (ref 134–144)
Total Protein: 7.3 g/dL (ref 6.0–8.5)
eGFR: 85 mL/min/{1.73_m2} (ref >59)

## 2022-06-27 LAB — HEMOGLOBIN A1C
Est. average glucose Bld gHb Est-mCnc: 128 mg/dL
Hgb A1c MFr Bld: 6.1 % — ABNORMAL HIGH (ref 4.8–5.6)

## 2022-06-27 NOTE — Progress Notes (Signed)
Discuss with patient at visit

## 2022-06-27 NOTE — Telephone Encounter (Signed)
Referral has been replaced with fax number and message in comment section.

## 2022-06-27 NOTE — Telephone Encounter (Signed)
Pt is requesting a referral to [facility]. Fax [phone]  Attn Bard Herbert

## 2022-06-28 ENCOUNTER — Ambulatory Visit: Payer: Medicare HMO | Admitting: Obstetrics and Gynecology

## 2022-06-29 ENCOUNTER — Telehealth: Payer: Self-pay

## 2022-06-29 NOTE — Telephone Encounter (Signed)
Neurology referral currently listed as pending

## 2022-06-29 NOTE — Telephone Encounter (Signed)
Referral sent to provided fax number.

## 2022-06-29 NOTE — Telephone Encounter (Signed)
Patient called office to see the status of her referral to neurology, please advise, thanks.

## 2022-07-02 ENCOUNTER — Encounter: Payer: Self-pay | Admitting: Nurse Practitioner

## 2022-07-02 ENCOUNTER — Ambulatory Visit (INDEPENDENT_AMBULATORY_CARE_PROVIDER_SITE_OTHER): Payer: Medicare HMO | Admitting: Nurse Practitioner

## 2022-07-02 VITALS — BP 119/70 | HR 70 | Ht 61.0 in | Wt 115.4 lb

## 2022-07-02 DIAGNOSIS — I25118 Atherosclerotic heart disease of native coronary artery with other forms of angina pectoris: Secondary | ICD-10-CM

## 2022-07-02 DIAGNOSIS — E785 Hyperlipidemia, unspecified: Secondary | ICD-10-CM | POA: Diagnosis not present

## 2022-07-02 DIAGNOSIS — E890 Postprocedural hypothyroidism: Secondary | ICD-10-CM

## 2022-07-02 DIAGNOSIS — R7301 Impaired fasting glucose: Secondary | ICD-10-CM | POA: Diagnosis not present

## 2022-07-02 DIAGNOSIS — F439 Reaction to severe stress, unspecified: Secondary | ICD-10-CM

## 2022-07-02 NOTE — Progress Notes (Signed)
Established patient visit   Patient: [name]   DOB: [birth year]   77 y.o. Female  MRN: [id number] Visit Date: 07/02/2022   Chief Complaint  Patient presents with   Medical Management of Chronic Issues   Subjective    HPI  Follow up  -lumbar radiculopathy.  --recent referral has been made  ---she states that pain is still present, however improved. She does have appointment with neurosurgery on Wednesday.  -Prediabetes  --recent HgbA1c was 6.1 with normal blood sugar.  -Hx CAD  Higher than usual PHQ 2/9 score.     Row Labels 07/02/2022    1:50 PM 05/14/2022    8:39 AM 12/25/2021    1:54 PM  Depression screen PHQ 2/9   Section Header. No data exists in this row.     Decreased Interest   0 0 0  Down, Depressed, Hopeless   0 0 0  PHQ - 2 Score   0 0 0  Altered sleeping   0  1  Tired, decreased energy   0  1  Change in appetite   0  0  Feeling bad or failure about yourself    0  0  Trouble concentrating   0  0  Moving slowly or fidgety/restless   0  0  Suicidal thoughts   0  0  PHQ-9 Score   0  2  Difficult doing work/chores   Not difficult at all      She states that her husband has recently been hospitalized for interstitial lung disease and pulmonary fibrosis.  Home care is falling on her.   Medications: Outpatient Medications Prior to Visit  Medication Sig   acetaminophen (TYLENOL) 500 MG tablet Take 500 mg by mouth daily as needed for moderate pain or headache.    Aspirin-Calcium Carbonate (BAYER WOMENS) 81-777 MG TABS Take 81 mg by mouth daily.   Calcium Citrate-Vitamin D (CALCIUM CITRATE + PO) Take by mouth daily at 12 noon.   diazepam (VALIUM) 5 MG tablet Take 1 tablet (5 mg total) by mouth at bedtime as needed for anxiety (sleep).   dorzolamide-timolol (COSOPT) 22.3-6.8 MG/ML ophthalmic solution Place into both eyes daily.   isosorbide mononitrate (IMDUR) 30 MG 24 hr tablet Take 0.5 tablets (15 mg total) by mouth daily.   latanoprost (XALATAN) 0.005 %  ophthalmic solution 1 drop at bedtime.   levothyroxine (SYNTHROID) 75 MCG tablet Take 75 mcg by mouth every morning.   liothyronine (CYTOMEL) 5 MCG tablet Take 5 mcg by mouth daily.   Lutein 6 MG TABS Takes four times a week   mirabegron ER (MYRBETRIQ) 25 MG TB24 tablet Take 1 tablet (25 mg total) by mouth daily.   Multiple Vitamin (MULTIVITAMIN) tablet Take 1 tablet by mouth daily.   pantoprazole (PROTONIX) 40 MG tablet Take 1 tablet (40 mg total) by mouth daily. (Patient taking differently: Take 40 mg by mouth as needed.)   rosuvastatin (CRESTOR) 40 MG tablet TAKE 1 TABLET BY MOUTH EVERY DAY   [DISCONTINUED] clopidogrel (PLAVIX) 75 MG tablet Take 1 tablet (75 mg total) by mouth daily.   No facility-administered medications prior to visit.    Review of Systems See HPI     Last CBC Lab Results  Component Value Date   WBC 6.5 04/12/2022   HGB 14.4 04/12/2022   HCT 43.7 04/12/2022   MCV 90.9 04/12/2022   MCH 29.9 04/12/2022   RDW 11.9 04/12/2022   PLT 227 04/12/2022   Last metabolic panel  Lab Results  Component Value Date   GLUCOSE 93 06/26/2022   NA 139 06/26/2022   K 4.7 06/26/2022   CL 102 06/26/2022   CO2 23 06/26/2022   BUN 13 06/26/2022   CREATININE 0.73 06/26/2022   EGFR 85 06/26/2022   CALCIUM 9.9 06/26/2022   PROT 7.3 06/26/2022   ALBUMIN 4.7 06/26/2022   LABGLOB 2.6 06/26/2022   AGRATIO 1.8 06/26/2022   BILITOT 0.5 06/26/2022   ALKPHOS 112 06/26/2022   AST 25 06/26/2022   ALT 18 06/26/2022   ANIONGAP 9 04/12/2022   Last lipids Lab Results  Component Value Date   CHOL 141 04/12/2022   HDL 57 04/12/2022   LDLCALC 63 04/12/2022   TRIG 105 04/12/2022   CHOLHDL 2.5 04/12/2022   Last hemoglobin A1c Lab Results  Component Value Date   HGBA1C 6.1 (H) 06/26/2022   Last thyroid functions Lab Results  Component Value Date   TSH 1.739 04/12/2022   Last vitamin D Lab Results  Component Value Date   VD25OH 36.6 09/13/2016       Objective      Today's Vitals   07/02/22 1309  BP: 119/70  Pulse: 70  SpO2: 98%  Weight: 115 lb 6.4 oz (52.3 kg)  Height: 5\' 1"  (1.549 m)   Body mass index is 21.8 kg/m.  BP Readings from Last 3 Encounters:  07/02/22 119/70  05/17/22 122/60  05/14/22 124/70    Wt Readings from Last 3 Encounters:  07/02/22 115 lb 6.4 oz (52.3 kg)  05/14/22 121 lb 4.8 oz (55 kg)  04/23/22 119 lb 9.6 oz (54.3 kg)    Physical Exam Vitals and nursing note reviewed.  Constitutional:      Appearance: Normal appearance. She is well-developed.  HENT:     Head: Normocephalic and atraumatic.     Nose: Nose normal.     Mouth/Throat:     Mouth: Mucous membranes are moist.     Pharynx: Oropharynx is clear.  Eyes:     Extraocular Movements: Extraocular movements intact.     Conjunctiva/sclera: Conjunctivae normal.     Pupils: Pupils are equal, round, and reactive to light.  Neck:     Vascular: No carotid bruit.  Cardiovascular:     Rate and Rhythm: Normal rate and regular rhythm.     Pulses: Normal pulses.     Heart sounds: Normal heart sounds.  Pulmonary:     Effort: Pulmonary effort is normal.     Breath sounds: Normal breath sounds.  Abdominal:     Palpations: Abdomen is soft.  Musculoskeletal:        General: Normal range of motion.     Cervical back: Normal range of motion and neck supple.  Lymphadenopathy:     Cervical: No cervical adenopathy.  Skin:    General: Skin is warm and dry.     Capillary Refill: Capillary refill takes less than 2 seconds.  Neurological:     General: No focal deficit present.     Mental Status: She is alert and oriented to person, place, and time.  Psychiatric:        Mood and Affect: Mood normal.        Behavior: Behavior normal.        Thought Content: Thought content normal.        Judgment: Judgment normal.      Assessment & Plan    Postoperative hypothyroidism Assessment & Plan: Normal thyroid panel  -continue levothyroxine and cytomel as  prescribed     Impaired fasting glucose Assessment & Plan: HgbA1c 6.1 with normal fasting glucose  -recommend he limit intake of CHO and sugar  -check HgbA1c in 6 months    Hyperlipidemia LDL goal <70 Assessment & Plan: Recent lipid panel -   Lipid Panel     Component Value Date/Time   CHOL 141 04/12/2022 1429   CHOL 142 10/12/2020 0846   TRIG 105 04/12/2022 1429   HDL 57 04/12/2022 1429   HDL 58 10/12/2020 0846   CHOLHDL 2.5 04/12/2022 1429   VLDL 21 04/12/2022 1429   LDLCALC 63 04/12/2022 1429   LDLCALC 70 10/12/2020 0846   LABVLDL 14 10/12/2020 0846    Managed per cardiology. Continue medication as prescribed    Coronary artery disease of native artery of native heart with stable angina pectoris [facility]) Assessment & Plan: Followed by cardiology, reviewed most recent cardiology note.  Continue current medication regimen, will continue to monitor in conjunction with cardiology.   Situational stress Assessment & Plan: Patient's husband has recently been hospitalized for interstitial lung disease and pulmonary fibrosis.  Home care is falling on her.  Increased stress Not currently treated with medication Will monitor       Return in about 6 months (around 01/01/2023) for medicare wellness, FBW a week prior to visit.         Carlean Jews, NP  [facility] at [facility] [phone] (phone) [phone] (fax)  [facility]

## 2022-07-04 DIAGNOSIS — M25559 Pain in unspecified hip: Secondary | ICD-10-CM | POA: Diagnosis not present

## 2022-07-25 ENCOUNTER — Other Ambulatory Visit: Payer: Self-pay | Admitting: Nurse Practitioner

## 2022-07-26 DIAGNOSIS — F439 Reaction to severe stress, unspecified: Secondary | ICD-10-CM | POA: Insufficient documentation

## 2022-07-26 DIAGNOSIS — R7301 Impaired fasting glucose: Secondary | ICD-10-CM | POA: Insufficient documentation

## 2022-07-26 NOTE — Assessment & Plan Note (Signed)
Recent lipid panel -   Lipid Panel     Component Value Date/Time   CHOL 141 04/12/2022 1429   CHOL 142 10/12/2020 0846   TRIG 105 04/12/2022 1429   HDL 57 04/12/2022 1429   HDL 58 10/12/2020 0846   CHOLHDL 2.5 04/12/2022 1429   VLDL 21 04/12/2022 1429   LDLCALC 63 04/12/2022 1429   LDLCALC 70 10/12/2020 0846   LABVLDL 14 10/12/2020 0846    Managed per cardiology. Continue medication as prescribed

## 2022-07-26 NOTE — Assessment & Plan Note (Signed)
Followed by cardiology, reviewed most recent cardiology note.  Continue current medication regimen, will continue to monitor in conjunction with cardiology. 

## 2022-07-26 NOTE — Assessment & Plan Note (Signed)
HgbA1c 6.1 with normal fasting glucose  -recommend he limit intake of CHO and sugar  -check HgbA1c in 6 months

## 2022-07-26 NOTE — Assessment & Plan Note (Signed)
Normal thyroid panel  -continue levothyroxine and cytomel as prescribed

## 2022-07-26 NOTE — Assessment & Plan Note (Signed)
Patient's husband has recently been hospitalized for interstitial lung disease and pulmonary fibrosis.  Home care is falling on her.  Increased stress Not currently treated with medication Will monitor

## 2022-07-27 DIAGNOSIS — M25552 Pain in left hip: Secondary | ICD-10-CM | POA: Diagnosis not present

## 2022-08-08 DIAGNOSIS — M6281 Muscle weakness (generalized): Secondary | ICD-10-CM | POA: Diagnosis not present

## 2022-08-08 DIAGNOSIS — M1612 Unilateral primary osteoarthritis, left hip: Secondary | ICD-10-CM | POA: Diagnosis not present

## 2022-08-09 NOTE — Addendum Note (Signed)
Addended by: Vincent Gros on: 08/09/2022 08:30 AM   Modules accepted: Level of Service

## 2022-08-14 DIAGNOSIS — M7062 Trochanteric bursitis, left hip: Secondary | ICD-10-CM | POA: Diagnosis not present

## 2022-08-17 DIAGNOSIS — M1612 Unilateral primary osteoarthritis, left hip: Secondary | ICD-10-CM | POA: Diagnosis not present

## 2022-08-17 DIAGNOSIS — M6281 Muscle weakness (generalized): Secondary | ICD-10-CM | POA: Diagnosis not present

## 2022-08-24 DIAGNOSIS — E890 Postprocedural hypothyroidism: Secondary | ICD-10-CM | POA: Diagnosis not present

## 2022-08-24 DIAGNOSIS — M6281 Muscle weakness (generalized): Secondary | ICD-10-CM | POA: Diagnosis not present

## 2022-08-24 DIAGNOSIS — M1612 Unilateral primary osteoarthritis, left hip: Secondary | ICD-10-CM | POA: Diagnosis not present

## 2022-09-03 DIAGNOSIS — E890 Postprocedural hypothyroidism: Secondary | ICD-10-CM | POA: Diagnosis not present

## 2022-09-04 DIAGNOSIS — M1612 Unilateral primary osteoarthritis, left hip: Secondary | ICD-10-CM | POA: Diagnosis not present

## 2022-09-06 ENCOUNTER — Ambulatory Visit (INDEPENDENT_AMBULATORY_CARE_PROVIDER_SITE_OTHER): Payer: Medicare HMO

## 2022-09-06 VITALS — Ht 61.0 in | Wt 107.0 lb

## 2022-09-06 DIAGNOSIS — Z0000 Encounter for general adult medical examination without abnormal findings: Secondary | ICD-10-CM | POA: Diagnosis not present

## 2022-09-06 NOTE — Progress Notes (Signed)
Subjective:   [name] is a 77 y.o. female who presents for Medicare Annual (Subsequent) preventive examination.  Visit Complete: Virtual  I connected with  Edmonia Lynch on 09/06/22 by a audio enabled telemedicine application and verified that I am speaking with the correct person using two identifiers.  Patient Location: Home  Provider Location: Home Office  I discussed the limitations of evaluation and management by telemedicine. The patient expressed understanding and agreed to proceed.  Patient Medicare AWV questionnaire was completed by the patient on ; I have confirmed that all information answered by patient is correct and no changes since this date.  Review of Systems     Cardiac Risk Factors include: advanced age (>75men, >81 women);Other (see comment), Risk factor comments: Dx: Hypotension     Objective:    Today's Vitals   09/06/22 0902  Weight: 107 lb (48.5 kg)  Height: 5\' 1"  (1.549 m)   Body mass index is 20.22 kg/m.     09/06/2022    9:10 AM 05/16/2018   11:22 AM 02/17/2018    2:39 PM 02/12/2018   10:33 AM 07/26/2017    3:04 PM 04/19/2017    6:25 AM 12/26/2016   10:25 AM  Advanced Directives  Does Patient Have a Medical Advance Directive? Yes Yes Yes Yes No Yes Yes  Type of Estate agent of [place];Living will Healthcare Power of [place];Living will Living will;Healthcare Power of [employer] Power of [place];Living will  Healthcare Power of [place];Living will Healthcare Power of [place];Living will  Does patient want to make changes to medical advance directive?  No - Patient declined  No - Patient declined  No - Patient declined   Copy of Healthcare Power of Attorney in Chart? No - copy requested No - copy requested  No - copy requested  No - copy requested     Current Medications (verified) Outpatient Encounter Medications as of 09/06/2022  Medication Sig   acetaminophen (TYLENOL) 500 MG tablet Take 500 mg by mouth  daily as needed for moderate pain or headache.    Aspirin-Calcium Carbonate (BAYER WOMENS) 81-777 MG TABS Take 81 mg by mouth daily.   Calcium Citrate-Vitamin D (CALCIUM CITRATE + PO) Take by mouth daily at 12 noon.   clopidogrel (PLAVIX) 75 MG tablet TAKE 1 TABLET BY MOUTH EVERY DAY   diazepam (VALIUM) 5 MG tablet Take 1 tablet (5 mg total) by mouth at bedtime as needed for anxiety (sleep).   dorzolamide-timolol (COSOPT) 22.3-6.8 MG/ML ophthalmic solution Place into both eyes daily.   isosorbide mononitrate (IMDUR) 30 MG 24 hr tablet Take 0.5 tablets (15 mg total) by mouth daily.   latanoprost (XALATAN) 0.005 % ophthalmic solution 1 drop at bedtime.   levothyroxine (SYNTHROID) 75 MCG tablet Take 75 mcg by mouth every morning.   liothyronine (CYTOMEL) 5 MCG tablet Take 5 mcg by mouth daily.   Lutein 6 MG TABS Takes four times a week   mirabegron ER (MYRBETRIQ) 25 MG TB24 tablet Take 1 tablet (25 mg total) by mouth daily.   Multiple Vitamin (MULTIVITAMIN) tablet Take 1 tablet by mouth daily.   pantoprazole (PROTONIX) 40 MG tablet Take 1 tablet (40 mg total) by mouth daily. (Patient taking differently: Take 40 mg by mouth as needed.)   rosuvastatin (CRESTOR) 40 MG tablet TAKE 1 TABLET BY MOUTH EVERY DAY   No facility-administered encounter medications on file as of 09/06/2022.    Allergies (verified) Neuromuscular blocking agents, Atorvastatin, and Morphine and codeine  History: Past Medical History:  Diagnosis Date   Anxiety    Barrett esophagus 2003   Diverticulitis    Diverticulosis    GERD (gastroesophageal reflux disease)    Glaucoma    Grave's disease    H. pylori infection    Hepatitis A    in her 19 from food   Hyperlipidemia    Hypothyroidism    Increased pressure in the eye    Insomnia    Morton's neuroma    Murmur    Scoliosis    Stroke (HCC) 12/2014   no deficits   Past Surgical History:  Procedure Laterality Date   AORTIC ARCH ANGIOGRAPHY N/A 01/01/2017    Procedure: AORTIC ARCH ANGIOGRAPHY;  Surgeon: Nada Libman, MD;  Location: [facility];  Service: Cardiovascular;  Laterality: N/A;   APPENDECTOMY  1958   CATARACT EXTRACTION Left    CATARACT EXTRACTION  02/12/2018   CATARACT EXTRACTION W/PHACO Left 07/02/2016   Procedure: CATARACT EXTRACTION PHACO AND INTRAOCULAR LENS PLACEMENT (IOC)  Left;  Surgeon: Sherald Hess, MD;  Location: [facility];  Service: Ophthalmology;  Laterality: Left;   CATARACT EXTRACTION W/PHACO Right 02/12/2018   Procedure: CATARACT EXTRACTION PHACO AND INTRAOCULAR LENS PLACEMENT (IOC)  RIGHT;  Surgeon: Lockie Mola, MD;  Location: [facility];  Service: Ophthalmology;  Laterality: Right;   CERVICAL LAMINECTOMY  1995   COLON SURGERY     CORONARY PRESSURE/FFR STUDY N/A 04/19/2017   Procedure: INTRAVASCULAR PRESSURE WIRE/FFR STUDY;  Surgeon: Yvonne Kendall, MD;  Location: [facility];  Service: Cardiovascular;  Laterality: N/A;   EYE SURGERY     due to graves disease   FEMUR SURGERY Right    benign bone growth   FOOT NEUROMA SURGERY Right    LAPAROSCOPIC ASSISTED VAGINAL HYSTERECTOMY     LAPAROSCOPIC SIGMOID COLECTOMY  05/13/06   Dr Abbey Chatters   LEFT HEART CATH AND CORONARY ANGIOGRAPHY N/A 04/19/2017   Procedure: LEFT HEART CATH AND CORONARY ANGIOGRAPHY;  Surgeon: Yvonne Kendall, MD;  Location: [facility];  Service: Cardiovascular;  Laterality: N/A;   LUMBAR DISC SURGERY  2009   PERIPHERAL VASCULAR INTERVENTION Left 01/01/2017   Procedure: PERIPHERAL VASCULAR INTERVENTION;  Surgeon: Nada Libman, MD;  Location: [facility];  Service: Cardiovascular;  Laterality: Left;   SUBCLAVIAN STENT PLACEMENT     TONSILLECTOMY  1992   TRIGGER FINGER RELEASE Right 05/26/2018   Procedure: RIGHT INDEX RELEASE TRIGGER FINGER/A-1 PULLEY;  Surgeon: Betha Loa, MD;  Location: [facility];  Service: Orthopedics;  Laterality: Right;   TUBAL LIGATION      UPPER EXTREMITY ANGIOGRAPHY Left 01/01/2017   Procedure: Upper Extremity Angiography;  Surgeon: Nada Libman, MD;  [facility];  Service: Cardiovascular;  Laterality: Left;   Family History  Problem Relation Age of Onset   Heart failure Mother 45   Heart murmur Mother    Other Father 53       SEPSIS   Hypertension Father    Stroke Father 99       CVA   Diabetes Sister    Heart failure Sister    Breast cancer Sister    Stroke Sister    Heart attack Sister 104   Hypertension Sister    Diabetes Brother    Hypertension Brother    Heart disease Brother    Cirrhosis Brother    Heart murmur Daughter    Breast cancer Maternal Aunt  Breast cancer Paternal Aunt    Breast cancer Cousin    Breast cancer Cousin    Breast cancer Cousin    Breast cancer Cousin    Stomach cancer Neg Hx    Colon cancer Neg Hx    Esophageal cancer Neg Hx    Pancreatic cancer Neg Hx    Social History   Socioeconomic History   Marital status: Married    Spouse name: Not on file   Number of children: 1   Years of education: Not on file   Highest education level: Not on file  Occupational History   Occupation: Pensions consultant    Comment: @ [employer] on [employer]  Tobacco Use   Smoking status: Former    Packs/day: .25    Types: Cigarettes    Quit date: 03/12/1978    Years since quitting: 44.5    Passive exposure: Never   Smokeless tobacco: Never   Tobacco comments:    quit in 1978  Vaping Use   Vaping Use: Never used  Substance and Sexual Activity   Alcohol use: Yes    Alcohol/week: 7.0 standard drinks of alcohol    Types: 7 Glasses of wine per week    Comment: 1 glass of wine every evening   Drug use: No   Sexual activity: Not Currently    Birth control/protection: None  Other Topics Concern   Not on file  Social History Narrative   Not on file   Social Determinants of Health   Financial Resource Strain: Low Risk  (09/06/2022)   Overall Financial Resource  Strain (CARDIA)    Difficulty of Paying Living Expenses: Not hard at all  Food Insecurity: No Food Insecurity (09/06/2022)   Hunger Vital Sign    Worried About Running Out of Food in the Last Year: Never true    Ran Out of Food in the Last Year: Never true  Transportation Needs: No Transportation Needs (09/06/2022)   PRAPARE - Administrator, Civil Service (Medical): No    Lack of Transportation (Non-Medical): No  Physical Activity: Insufficiently Active (09/06/2022)   Exercise Vital Sign    Days of Exercise per Week: 7 days    Minutes of Exercise per Session: 20 min  Stress: No Stress Concern Present (09/06/2022)   [employer] of Occupational Health - Occupational Stress Questionnaire    Feeling of Stress : Not at all  Social Connections: Moderately Integrated (09/06/2022)   Social Connection and Isolation Panel [NHANES]    Frequency of Communication with Friends and Family: More than three times a week    Frequency of Social Gatherings with Friends and Family: More than three times a week    Attends Religious Services: More than 4 times per year    Active Member of [employer] or Organizations: Yes    Attends Banker Meetings: More than 4 times per year    Marital Status: Widowed    Tobacco Counseling Counseling given: Not Answered Tobacco comments: quit in 1978   Clinical Intake:  Pre-visit preparation completed: No  Pain : No/denies pain     BMI - recorded: 20.22 Nutritional Status: BMI of 19-24  Normal Nutritional Risks: None Diabetes: No  How often do you need to have someone help you when you read instructions, pamphlets, or other written materials from your doctor or pharmacy?: 1 - Never  Interpreter Needed?: No  Information entered by :: Theresa Mulligan LPN   Activities of Daily Living  09/06/2022    9:08 AM 12/25/2021    1:55 PM  In your present state of health, do you have any difficulty performing the following activities:   Hearing? 0 0  Vision? 0 1  Difficulty concentrating or making decisions? 0 0  Walking or climbing stairs? 0 0  Dressing or bathing? 0 0  Doing errands, shopping? 0 0  Preparing Food and eating ? N   Using the Toilet? N   In the past six months, have you accidently leaked urine? N   Do you have problems with loss of bowel control? N   Managing your Medications? N   Managing your Finances? N   Housekeeping or managing your Housekeeping? N     Patient Care Team: Melida Quitter, PA as PCP - General (Family Medicine) End, Cristal Deer, MD as PCP - Cardiology (Cardiology) Lunette Stands, MD as Consulting Physician (Orthopedic Surgery) Micki Riley, MD as Consulting Physician (Neurology) Rachael Fee, MD as Attending Physician (Gastroenterology) Sherlyn Lees Lavella Hammock, MD as Referring Physician (Ophthalmology)  Indicate any recent Medical Services you may have received from other than [facility] providers in the past year (date may be approximate).     Assessment:   This is a routine wellness examination for Nafisah.  Hearing/Vision screen Hearing Screening - Comments:: Denies hearing difficulties   Vision Screening - Comments:: Wears rx glasses - up to date with routine eye exams with  [facility]  Dietary issues and exercise activities discussed:     Goals Addressed               This Visit's Progress     Stay Healthy (pt-stated)         Depression Screen    09/06/2022    9:08 AM 07/02/2022    1:50 PM 05/14/2022    8:39 AM 12/25/2021    1:54 PM 02/28/2021   11:00 AM 10/19/2020    8:56 AM 09/27/2020    9:01 AM  PHQ 2/9 Scores  PHQ - 2 Score 0 0 0 0 0 0 0  PHQ- 9 Score  0  2 2 0 0    Fall Risk    09/06/2022    9:09 AM 07/02/2022    1:49 PM 02/28/2021   10:59 AM 10/19/2020    8:56 AM 09/27/2020    9:01 AM  Fall Risk   Falls in the past year? 0 0 0 0 0  Number falls in past yr: 0 0 0 0 0  Injury with Fall? 0 0 0 0 0  Risk for fall due to : No Fall  Risks No Fall Risks No Fall Risks No Fall Risks   Follow up Falls prevention discussed Falls evaluation completed Falls evaluation completed Falls evaluation completed Falls evaluation completed    MEDICARE RISK AT HOME:  Medicare Risk at Home - 09/06/22 0914     Any stairs in or around the home? Yes    If so, are there any without handrails? No    Home free of loose throw rugs in walkways, pet beds, electrical cords, etc? Yes    Adequate lighting in your home to reduce risk of falls? Yes    Life alert? No    Use of a cane, walker or w/c? No    Grab bars in the bathroom? Yes    Shower chair or bench in shower? Yes    Elevated toilet seat or a handicapped toilet? Yes  TIMED UP AND GO:  Was the test performed?  No    Cognitive Function:        09/06/2022    9:10 AM 12/25/2021    1:57 PM 10/19/2020    8:57 AM 10/19/2019   11:06 AM 09/17/2016   10:40 AM  6CIT Screen  What Year? 0 points 0 points 0 points 0 points 0 points  What month? 0 points 0 points 0 points 0 points 0 points  What time? 0 points 0 points 0 points 0 points 0 points  Count back from 20 0 points 0 points 0 points 0 points 0 points  Months in reverse 0 points 2 points 0 points 0 points 0 points  Repeat phrase 0 points 0 points 0 points 4 points 0 points  Total Score 0 points 2 points 0 points 4 points 0 points    Immunizations Immunization History  Administered Date(s) Administered   Influenza, High Dose Seasonal PF 02/24/2015, 12/10/2021   Influenza-Unspecified 01/09/2016, 12/12/2016, 12/11/2018   PFIZER(Purple Top)SARS-COV-2 Vaccination 04/08/2019, 04/29/2019   Pneumococcal Conjugate-13 12/12/2016   Pneumococcal Polysaccharide-23 09/18/2018    TDAP status: Due, Education has been provided regarding the importance of this vaccine. Advised may receive this vaccine at local pharmacy or Health Dept. Aware to provide a copy of the vaccination record if obtained from local pharmacy or Health  Dept. Verbalized acceptance and understanding.  Flu Vaccine status: Up to date  Pneumococcal vaccine status: Up to date  Covid-19 vaccine status: Completed vaccines  Qualifies for Shingles Vaccine? Yes   Zostavax completed No   Shingrix Completed?: No.    Education has been provided regarding the importance of this vaccine. Patient has been advised to call insurance company to determine out of pocket expense if they have not yet received this vaccine. Advised may also receive vaccine at local pharmacy or Health Dept. Verbalized acceptance and understanding.  Screening Tests Health Maintenance  Topic Date Due   Hepatitis C Screening  Never done   DTaP/Tdap/Td (1 - Tdap) Never done   Zoster Vaccines- Shingrix (1 of 2) Never done   COVID-19 Vaccine (3 - 2023-24 season) 11/10/2021   INFLUENZA VACCINE  10/11/2022   Medicare Annual Wellness (AWV)  09/06/2023   Pneumonia Vaccine 63+ Years old  Completed   DEXA SCAN  Completed   HPV VACCINES  Aged Out   Colonoscopy  Discontinued    Health Maintenance  Health Maintenance Due  Topic Date Due   Hepatitis C Screening  Never done   DTaP/Tdap/Td (1 - Tdap) Never done   Zoster Vaccines- Shingrix (1 of 2) Never done   COVID-19 Vaccine (3 - 2023-24 season) 11/10/2021    Colorectal cancer screening: No longer required.   Mammogram status: No longer required due to Age.  Bone Density status: Completed 11/29/14. Results reflect: Bone density results: OSTEOPENIA. Repeat every   years.  Lung Cancer Screening: (Low Dose CT Chest recommended if Age 78-80 years, 20 pack-year currently smoking OR have quit w/in 15years.) does not qualify.     Additional Screening:  Hepatitis C Screening: does qualify;  Deferred  Vision Screening: Recommended annual ophthalmology exams for early detection of glaucoma and other disorders of the eye. Is the patient up to date with their annual eye exam?  Yes  Who is the provider or what is the name of the  office in which the patient attends annual eye exams? [facility] If pt is not established with a provider, would they like  to be referred to a provider to establish care? No .   Dental Screening: Recommended annual dental exams for proper oral hygiene    Community Resource Referral / Chronic Care Management:  CRR required this visit?  No   CCM required this visit?  No     Plan:     I have personally reviewed and noted the following in the patient's chart:   Medical and social history Use of alcohol, tobacco or illicit drugs  Current medications and supplements including opioid prescriptions. Patient is not currently taking opioid prescriptions. Functional ability and status Nutritional status Physical activity Advanced directives List of other physicians Hospitalizations, surgeries, and ER visits in previous 12 months Vitals Screenings to include cognitive, depression, and falls Referrals and appointments  In addition, I have reviewed and discussed with patient certain preventive protocols, quality metrics, and best practice recommendations. A written personalized care plan for preventive services as well as general preventive health recommendations were provided to patient.     Tillie Rung, LPN   [id number]   After Visit Summary: (MyChart) Due to this being a telephonic visit, the after visit summary with patients personalized plan was offered to patient via MyChart   Nurse Notes: None

## 2022-09-06 NOTE — Patient Instructions (Addendum)
[name] , Thank you for taking time to come for your Medicare Wellness Visit. I appreciate your ongoing commitment to your health goals. Please review the following plan we discussed and let me know if I can assist you in the future.   These are the goals we discussed:  Goals       Stay Healthy (pt-stated)        This is a list of the screening recommended for you and due dates:  Health Maintenance  Topic Date Due   Hepatitis C Screening  Never done   DTaP/Tdap/Td vaccine (1 - Tdap) Never done   Zoster (Shingles) Vaccine (1 of 2) Never done   COVID-19 Vaccine (3 - 2023-24 season) 11/10/2021   Flu Shot  10/11/2022   Medicare Annual Wellness Visit  09/06/2023   Pneumonia Vaccine  Completed   DEXA scan (bone density measurement)  Completed   HPV Vaccine  Aged Out   Colon Cancer Screening  Discontinued    Advanced directives: Please bring a copy of your health care power of attorney and living will to the office to be added to your chart at your convenience.   Conditions/risks identified: None  Next appointment: Follow up in one year for your annual wellness visit    Preventive Care 65 Years and Older, Female Preventive care refers to lifestyle choices and visits with your health care provider that can promote health and wellness. What does preventive care include? A yearly physical exam. This is also called an annual well check. Dental exams once or twice a year. Routine eye exams. Ask your health care provider how often you should have your eyes checked. Personal lifestyle choices, including: Daily care of your teeth and gums. Regular physical activity. Eating a healthy diet. Avoiding tobacco and drug use. Limiting alcohol use. Practicing safe sex. Taking low-dose aspirin every day. Taking vitamin and mineral supplements as recommended by your health care provider. What happens during an annual well check? The services and screenings done by your health care provider  during your annual well check will depend on your age, overall health, lifestyle risk factors, and family history of disease. Counseling  Your health care provider may ask you questions about your: Alcohol use. Tobacco use. Drug use. Emotional well-being. Home and relationship well-being. Sexual activity. Eating habits. History of falls. Memory and ability to understand (cognition). Work and work Astronomer. Reproductive health. Screening  You may have the following tests or measurements: Height, weight, and BMI. Blood pressure. Lipid and cholesterol levels. These may be checked every 5 years, or more frequently if you are over 32 years old. Skin check. Lung cancer screening. You may have this screening every year starting at age 3 if you have a 30-pack-year history of smoking and currently smoke or have quit within the past 15 years. Fecal occult blood test (FOBT) of the stool. You may have this test every year starting at age 71. Flexible sigmoidoscopy or colonoscopy. You may have a sigmoidoscopy every 5 years or a colonoscopy every 10 years starting at age 64. Hepatitis C blood test. Hepatitis B blood test. Sexually transmitted disease (STD) testing. Diabetes screening. This is done by checking your blood sugar (glucose) after you have not eaten for a while (fasting). You may have this done every 1-3 years. Bone density scan. This is done to screen for osteoporosis. You may have this done starting at age 66. Mammogram. This may be done every 1-2 years. Talk to your health care provider  about how often you should have regular mammograms. Talk with your health care provider about your test results, treatment options, and if necessary, the need for more tests. Vaccines  Your health care provider may recommend certain vaccines, such as: Influenza vaccine. This is recommended every year. Tetanus, diphtheria, and acellular pertussis (Tdap, Td) vaccine. You may need a Td booster every  10 years. Zoster vaccine. You may need this after age 66. Pneumococcal 13-valent conjugate (PCV13) vaccine. One dose is recommended after age 69. Pneumococcal polysaccharide (PPSV23) vaccine. One dose is recommended after age 67. Talk to your health care provider about which screenings and vaccines you need and how often you need them. This information is not intended to replace advice given to you by your health care provider. Make sure you discuss any questions you have with your health care provider. Document Released: 03/25/2015 Document Revised: 11/16/2015 Document Reviewed: 12/28/2014 Elsevier Interactive Patient Education  2017 [employer] Prevention in the Home Falls can cause injuries. They can happen to people of all ages. There are many things you can do to make your home safe and to help prevent falls. What can I do on the outside of my home? Regularly fix the edges of walkways and driveways and fix any cracks. Remove anything that might make you trip as you walk through a door, such as a raised step or threshold. Trim any bushes or trees on the path to your home. Use bright outdoor lighting. Clear any walking paths of anything that might make someone trip, such as rocks or tools. Regularly check to see if handrails are loose or broken. Make sure that both sides of any steps have handrails. Any raised decks and porches should have guardrails on the edges. Have any leaves, snow, or ice cleared regularly. Use sand or salt on walking paths during winter. Clean up any spills in your garage right away. This includes oil or grease spills. What can I do in the bathroom? Use night lights. Install grab bars by the toilet and in the tub and shower. Do not use towel bars as grab bars. Use non-skid mats or decals in the tub or shower. If you need to sit down in the shower, use a plastic, non-slip stool. Keep the floor dry. Clean up any water that spills on the floor as soon as it  happens. Remove soap buildup in the tub or shower regularly. Attach bath mats securely with double-sided non-slip rug tape. Do not have throw rugs and other things on the floor that can make you trip. What can I do in the bedroom? Use night lights. Make sure that you have a light by your bed that is easy to reach. Do not use any sheets or blankets that are too big for your bed. They should not hang down onto the floor. Have a firm chair that has side arms. You can use this for support while you get dressed. Do not have throw rugs and other things on the floor that can make you trip. What can I do in the kitchen? Clean up any spills right away. Avoid walking on wet floors. Keep items that you use a lot in easy-to-reach places. If you need to reach something above you, use a strong step stool that has a grab bar. Keep electrical cords out of the way. Do not use floor polish or wax that makes floors slippery. If you must use wax, use non-skid floor wax. Do not have throw rugs and  other things on the floor that can make you trip. What can I do with my stairs? Do not leave any items on the stairs. Make sure that there are handrails on both sides of the stairs and use them. Fix handrails that are broken or loose. Make sure that handrails are as long as the stairways. Check any carpeting to make sure that it is firmly attached to the stairs. Fix any carpet that is loose or worn. Avoid having throw rugs at the top or bottom of the stairs. If you do have throw rugs, attach them to the floor with carpet tape. Make sure that you have a light switch at the top of the stairs and the bottom of the stairs. If you do not have them, ask someone to add them for you. What else can I do to help prevent falls? Wear shoes that: Do not have high heels. Have rubber bottoms. Are comfortable and fit you well. Are closed at the toe. Do not wear sandals. If you use a stepladder: Make sure that it is fully opened.  Do not climb a closed stepladder. Make sure that both sides of the stepladder are locked into place. Ask someone to hold it for you, if possible. Clearly mark and make sure that you can see: Any grab bars or handrails. First and last steps. Where the edge of each step is. Use tools that help you move around (mobility aids) if they are needed. These include: Canes. Walkers. Scooters. Crutches. Turn on the lights when you go into a dark area. Replace any light bulbs as soon as they burn out. Set up your furniture so you have a clear path. Avoid moving your furniture around. If any of your floors are uneven, fix them. If there are any pets around you, be aware of where they are. Review your medicines with your doctor. Some medicines can make you feel dizzy. This can increase your chance of falling. Ask your doctor what other things that you can do to help prevent falls. This information is not intended to replace advice given to you by your health care provider. Make sure you discuss any questions you have with your health care provider. Document Released: 12/23/2008 Document Revised: 08/04/2015 Document Reviewed: 04/02/2014 Elsevier Interactive Patient Education  2017 [employer].

## 2022-09-07 NOTE — Progress Notes (Signed)
Vaccine information entered and updated in immunization record per patient request.

## 2022-09-11 ENCOUNTER — Ambulatory Visit
Admission: EM | Admit: 2022-09-11 | Discharge: 2022-09-11 | Disposition: A | Discharge status: Home / Self Care | Payer: Medicare HMO | Attending: Family Medicine | Admitting: Family Medicine

## 2022-09-11 DIAGNOSIS — J22 Unspecified acute lower respiratory infection: Secondary | ICD-10-CM | POA: Diagnosis not present

## 2022-09-11 MED ORDER — AMOXICILLIN-POT CLAVULANATE 875-125 MG PO TABS: 1.0000 | ORAL_TABLET | Freq: Two times a day (BID) | ORAL | 0 refills | Status: AC

## 2022-09-11 MED ORDER — BENZONATATE 100 MG PO CAPS: 200.0000 mg | ORAL_CAPSULE | Freq: Three times a day (TID) | ORAL | 0 refills | Status: DC | PRN

## 2022-09-11 NOTE — ED Provider Notes (Signed)
[name]    CSN: [id number] Arrival date & time: 09/11/22  1606      History   Chief Complaint Chief Complaint  Patient presents with   Cough    HPI [name] is a 77 y.o. female.   HPI Patient here today for evaluation of a cough x 4  Past Medical History:  Diagnosis Date   Anxiety    Barrett esophagus 2003   Diverticulitis    Diverticulosis    GERD (gastroesophageal reflux disease)    Glaucoma    Grave's disease    H. pylori infection    Hepatitis A    in her 19 from food   Hyperlipidemia    Hypothyroidism    Increased pressure in the eye    Insomnia    Morton's neuroma    Murmur    Scoliosis    Stroke (HCC) 12/2014   no deficits    Patient Active Problem List   Diagnosis Date Noted   Impaired fasting glucose 07/26/2022   Situational stress 07/26/2022   Lightheadedness 04/13/2022   Tobacco user 04/17/2021   Osteopenia 04/17/2021   Chronic kidney disease, stage 2 (mild) 04/17/2021   Personal history of COVID-19 09/27/2020   Tinnitus of both ears 08/16/2020   Prediabetes 10/28/2018   Trigger finger, right index finger 01/01/2018   Coronary artery disease of native artery of native heart with stable angina pectoris (HCC) 05/23/2017   PAD (peripheral artery disease) (HCC) 05/23/2017   Vaginal dryness 02/25/2017   Depression, recurrent (HCC) 02/25/2017   Stable angina 01/08/2017   History of stroke 01/08/2017   Accelerating angina (HCC) 11/17/2016   Subclavian artery stenosis (HCC) 11/17/2016   Hyperlipidemia LDL goal <70 11/17/2016   Hypotension 08/07/2016   Cerebral infarction (HCC) 12/26/2014   Anxiety 12/26/2014   Glaucoma 12/26/2014   Allergic rhinitis 12/26/2014   Cerebrovascular accident (CVA) (HCC) 12/26/2014   Postoperative hypothyroidism 12/26/2014   Cerebral infarction due to embolism of right vertebral artery (HCC)    Visual field loss    Occlusion and stenosis of vertebral artery    Insomnia 03/23/2013   GERD  (gastroesophageal reflux disease) 11/26/2011   Morton's neuroma 11/02/2011   Chronic sinusitis 10/09/2011    Past Surgical History:  Procedure Laterality Date   AORTIC ARCH ANGIOGRAPHY N/A 01/01/2017   Procedure: AORTIC ARCH ANGIOGRAPHY;  Surgeon: Nada Libman, MD;  Location: [facility];  Service: Cardiovascular;  Laterality: N/A;   APPENDECTOMY  1958   CATARACT EXTRACTION Left    CATARACT EXTRACTION  02/12/2018   CATARACT EXTRACTION W/PHACO Left 07/02/2016   Procedure: CATARACT EXTRACTION PHACO AND INTRAOCULAR LENS PLACEMENT (IOC)  Left;  Surgeon: Sherald Hess, MD;  Location: [facility];  Service: Ophthalmology;  Laterality: Left;   CATARACT EXTRACTION W/PHACO Right 02/12/2018   Procedure: CATARACT EXTRACTION PHACO AND INTRAOCULAR LENS PLACEMENT (IOC)  RIGHT;  Surgeon: Lockie Mola, MD;  Location: [facility];  Service: Ophthalmology;  Laterality: Right;   CERVICAL LAMINECTOMY  1995   COLON SURGERY     CORONARY PRESSURE/FFR STUDY N/A 04/19/2017   Procedure: INTRAVASCULAR PRESSURE WIRE/FFR STUDY;  Surgeon: Yvonne Kendall, MD;  Location: [facility];  Service: Cardiovascular;  Laterality: N/A;   EYE SURGERY     due to graves disease   FEMUR SURGERY Right    benign bone growth   FOOT NEUROMA SURGERY Right    LAPAROSCOPIC ASSISTED VAGINAL HYSTERECTOMY     LAPAROSCOPIC SIGMOID COLECTOMY  05/13/06   Dr Abbey Chatters   LEFT HEART CATH AND CORONARY ANGIOGRAPHY N/A 04/19/2017   Procedure: LEFT HEART CATH AND CORONARY ANGIOGRAPHY;  Surgeon: Yvonne Kendall, MD;  Location: [facility];  Service: Cardiovascular;  Laterality: N/A;   LUMBAR DISC SURGERY  2009   PERIPHERAL VASCULAR INTERVENTION Left 01/01/2017   Procedure: PERIPHERAL VASCULAR INTERVENTION;  Surgeon: Nada Libman, MD;  Location: [facility];  Service: Cardiovascular;  Laterality: Left;   SUBCLAVIAN STENT PLACEMENT     TONSILLECTOMY  1992   TRIGGER FINGER RELEASE  Right 05/26/2018   Procedure: RIGHT INDEX RELEASE TRIGGER FINGER/A-1 PULLEY;  Surgeon: Betha Loa, MD;  Location: [facility];  Service: Orthopedics;  Laterality: Right;   TUBAL LIGATION     UPPER EXTREMITY ANGIOGRAPHY Left 01/01/2017   Procedure: Upper Extremity Angiography;  Surgeon: Nada Libman, MD;  Location: [facility];  Service: Cardiovascular;  Laterality: Left;    OB History     Gravida  1   Para      Term      Preterm      AB  0   Living  1      SAB  0   IAB      Ectopic  0   Multiple      Live Births               Home Medications    Prior to Admission medications   Medication Sig Start Date End Date Taking? Authorizing Provider  acetaminophen (TYLENOL) 500 MG tablet Take 500 mg by mouth daily as needed for moderate pain or headache.    Yes [provider]  Aspirin-Calcium Carbonate (BAYER WOMENS) [id number] MG TABS Take 81 mg by mouth daily. 01/01/18  Yes [provider]  Calcium Citrate-Vitamin D (CALCIUM CITRATE + PO) Take by mouth daily at 12 noon.   Yes [provider]  clopidogrel (PLAVIX) 75 MG tablet TAKE 1 TABLET BY MOUTH EVERY DAY 07/25/22  Yes Saralyn Pilar A, PA  diazepam (VALIUM) 5 MG tablet Take 1 tablet (5 mg total) by mouth at bedtime as needed for anxiety (sleep). 05/14/22  Yes Saralyn Pilar A, PA  dorzolamide-timolol (COSOPT) 22.3-6.8 MG/ML ophthalmic solution Place into both eyes daily.   Yes [provider]  isosorbide mononitrate (IMDUR) 30 MG 24 hr tablet Take 0.5 tablets (15 mg total) by mouth daily. 05/01/22  Yes End, Cristal Deer, MD  latanoprost (XALATAN) 0.005 % ophthalmic solution 1 drop at bedtime.   Yes [provider]  levothyroxine (SYNTHROID) 75 MCG tablet Take 75 mcg by mouth every morning. 12/02/21  Yes [provider]  liothyronine (CYTOMEL) 5 MCG tablet Take 5 mcg by mouth daily.   Yes [provider]  Lutein 6 MG TABS Takes four  times a week 01/02/21  Yes [provider]  mirabegron ER (MYRBETRIQ) 25 MG TB24 tablet Take 1 tablet (25 mg total) by mouth daily. 05/17/22  Yes Selmer Dominion, NP  Multiple Vitamin (MULTIVITAMIN) tablet Take 1 tablet by mouth daily.   Yes [provider]  pantoprazole (PROTONIX) 40 MG tablet Take 1 tablet (40 mg total) by mouth daily. Patient taking differently: Take 40 mg by mouth as needed. 04/19/21  Yes Nandigam, Eleonore Chiquito, MD  rosuvastatin (CRESTOR) 40 MG tablet TAKE 1 TABLET BY MOUTH EVERY DAY 04/16/22  Yes End, Cristal Deer, MD    Family History Family History  Problem Relation Age of Onset  Heart failure Mother 2   Heart murmur Mother    Other Father 82       SEPSIS   Hypertension Father    Stroke Father 38       CVA   Diabetes Sister    Heart failure Sister    Breast cancer Sister    Stroke Sister    Heart attack Sister 55   Hypertension Sister    Diabetes Brother    Hypertension Brother    Heart disease Brother    Cirrhosis Brother    Heart murmur Daughter    Breast cancer Maternal Aunt    Breast cancer Paternal Aunt    Breast cancer Cousin    Breast cancer Cousin    Breast cancer Cousin    Breast cancer Cousin    Stomach cancer Neg Hx    Colon cancer Neg Hx    Esophageal cancer Neg Hx    Pancreatic cancer Neg Hx     Social History Social History   Tobacco Use   Smoking status: Former    Packs/day: .25    Types: Cigarettes    Quit date: 03/12/1978    Years since quitting: 44.5    Passive exposure: Never   Smokeless tobacco: Never   Tobacco comments:    quit in 1978  Vaping Use   Vaping Use: Never used  Substance Use Topics   Alcohol use: Yes    Alcohol/week: 7.0 standard drinks of alcohol    Types: 7 Glasses of wine per week    Comment: 1 glass of wine every evening   Drug use: No     Allergies   Atorvastatin, Neuromuscular blocking agents, and Morphine and codeine   Review of Systems Review of Systems   Physical  Exam Triage Vital Signs ED Triage Vitals  Enc Vitals Group     BP 09/11/22 1619 (!) 154/79     Pulse Rate 09/11/22 1619 76     Resp 09/11/22 1619 18     Temp 09/11/22 1619 (!) 97.4 F (36.3 C)     Temp Source 09/11/22 1619 Oral     SpO2 09/11/22 1619 96 %     Weight 09/11/22 1616 106 lb 13 oz (48.5 kg)     Height 09/11/22 1616 5\' 1"  (1.549 m)     Head Circumference --      Peak Flow --      Pain Score 09/11/22 1616 0     Pain Loc --      Pain Edu? --      Excl. in GC? --    No data found.  Updated Vital Signs BP (!) 150/75 (BP Location: Left Arm)   Pulse 78   Temp (!) 97.4 F (36.3 C) (Oral)   Resp 18   Ht 5\' 1"  (1.549 m)   Wt 106 lb 13 oz (48.5 kg)   SpO2 96%   BMI 20.18 kg/m   Visual Acuity Right Eye Distance:   Left Eye Distance:   Bilateral Distance:    Right Eye Near:   Left Eye Near:    Bilateral Near:     Physical Exam   UC Treatments / Results  Labs (all labs ordered are listed, but only abnormal results are displayed) Labs Reviewed - No data to display  EKG   Radiology No results found.  Procedures Procedures (including critical care time)  Medications Ordered in UC Medications - No data to display  Initial Impression / Assessment and Plan /  UC Course  I have reviewed the triage vital signs and the nursing notes.  Pertinent labs & imaging results that were available during my care of the patient were reviewed by me and considered in my medical decision making (see chart for details).     *** Final Clinical Impressions(s) / UC Diagnoses   Final diagnoses:  None   Discharge Instructions   None    ED Prescriptions   None    PDMP not reviewed this encounter.

## 2022-09-11 NOTE — ED Triage Notes (Signed)
"  Has been for a few weeks now". "My Husband was very ill for a few months and passed away in 2022-07-23". Significant reaction to hip injection on May 27th and cough followed this. No fever. Some sob at times.

## 2022-09-11 NOTE — Discharge Instructions (Addendum)
Go to one of the radiology office listed above to have your x-ray completed tomorrow anytime between 8:00 am -4:00 pm.

## 2022-09-12 ENCOUNTER — Ambulatory Visit (HOSPITAL_BASED_OUTPATIENT_CLINIC_OR_DEPARTMENT_OTHER)
Admission: RE | Admit: 2022-09-12 | Discharge: 2022-09-12 | Disposition: A | Discharge status: Home / Self Care | Payer: Medicare HMO | Source: Ambulatory Visit | Attending: Family Medicine | Admitting: Family Medicine

## 2022-09-12 DIAGNOSIS — R059 Cough, unspecified: Secondary | ICD-10-CM | POA: Diagnosis not present

## 2022-09-12 DIAGNOSIS — Z0189 Encounter for other specified special examinations: Secondary | ICD-10-CM | POA: Diagnosis not present

## 2022-09-12 DIAGNOSIS — R0602 Shortness of breath: Secondary | ICD-10-CM | POA: Diagnosis not present

## 2022-09-12 LAB — CBC AND DIFFERENTIAL
HCT: 47 — AB (ref 36–46)
Hemoglobin: 14.9 (ref 12.0–16.0)
Platelets: 281 10*3/uL (ref 150–400)
WBC: 7.8

## 2022-09-12 LAB — BASIC METABOLIC PANEL
BUN: 15 (ref 4–21)
CO2: 23 — AB (ref 13–22)
Chloride: 95 — AB (ref 99–108)
Creatinine: 0.7 (ref 0.5–1.1)
Glucose: 100
Potassium: 5 mEq/L (ref 3.5–5.1)
Sodium: 132 — AB (ref 137–147)

## 2022-09-12 LAB — CBC: RBC: 5.04 (ref 3.87–5.11)

## 2022-09-12 LAB — HEPATIC FUNCTION PANEL
ALT: 21 U/L (ref 7–35)
AST: 29 (ref 13–35)
Alkaline Phosphatase: 91 (ref 25–125)
Bilirubin, Total: 0.5

## 2022-09-12 LAB — COMPREHENSIVE METABOLIC PANEL
Albumin: 4.5 (ref 3.5–5.0)
Calcium: 9.4 (ref 8.7–10.7)
Globulin: 2.5
eGFR: 83

## 2022-09-20 ENCOUNTER — Encounter: Payer: Self-pay | Admitting: Family Medicine

## 2022-09-20 ENCOUNTER — Ambulatory Visit (INDEPENDENT_AMBULATORY_CARE_PROVIDER_SITE_OTHER): Payer: Medicare HMO | Admitting: Family Medicine

## 2022-09-20 VITALS — BP 120/72 | HR 70 | Resp 18 | Ht 61.0 in | Wt 109.0 lb

## 2022-09-20 DIAGNOSIS — R634 Abnormal weight loss: Secondary | ICD-10-CM

## 2022-09-20 DIAGNOSIS — E119 Type 2 diabetes mellitus without complications: Secondary | ICD-10-CM

## 2022-09-20 LAB — POCT GLYCOSYLATED HEMOGLOBIN (HGB A1C): HbA1c POC (<> result, manual entry): 6.7 % (ref 4.0–5.6)

## 2022-09-20 NOTE — Assessment & Plan Note (Signed)
Patient was prediabetic for a long time, A1c on 06/26/2022 was 6.1.  Given recent weight loss, I did want to check A1c as hyperglycemia can cause weight loss.  A1c was 6.7 meeting criteria for diagnosis of type 2 diabetes.  We discussed that we do not need to start medication at this time, but will be important to limit foods that are high in sugar/carbohydrates.  When eating most foods, pairing them with fiber, protein, or healthy fats can decrease the impact on blood sugars as well.  Patient verbalized understanding.  She is glad to have an answer to part of the puzzle regarding her recent symptoms.

## 2022-09-20 NOTE — Progress Notes (Signed)
Acute Office Visit  Subjective:     Patient ID: [name], female    DOB: 02-18-1946, 77 y.o.   MRN: [id number]  Chief Complaint  Patient presents with   Weight Loss    Unintentional    Cough    HPI Patient is in today for unintentional weight loss.  She has lost about 10-15 pounds in the past 5 months.  She does endorse that her appetite has been lower, she is still grieving the loss of her husband earlier this year.  It is concerning to her that she continues to lose weight.  She had her son who is a PA order CMP and CBC, both of which did not have any significant abnormalities.  She saw her endocrinologist a few weeks ago and thyroid labs were within normal limits. Denies fever, chills, fatigue, rash, nausea/vomiting, abdominal pain, diarrhea/constipation, musculoskeletal pains, headache.  ROS Negative unless otherwise noted in HPI    Objective:    BP 120/72 (BP Location: Right Arm, Patient Position: Sitting, Cuff Size: Normal)   Pulse 70   Resp 18   Ht 5\' 1"  (1.549 m)   Wt 109 lb (49.4 kg)   SpO2 94%   BMI 20.60 kg/m   Physical Exam Constitutional:      General: She is not in acute distress.    Appearance: Normal appearance.  HENT:     Head: Normocephalic and atraumatic.  Cardiovascular:     Rate and Rhythm: Normal rate and regular rhythm.     Heart sounds: No murmur heard.    No friction rub. No gallop.  Pulmonary:     Effort: Pulmonary effort is normal. No respiratory distress.     Breath sounds: No wheezing, rhonchi or rales.  Skin:    General: Skin is warm and dry.  Neurological:     Mental Status: She is alert and oriented to person, place, and time.    Results for orders placed or performed in visit on 09/20/22  POCT HgB A1C  Result Value Ref Range   Hemoglobin A1C     HbA1c POC (<> result, manual entry) 6.7 4.0 - 5.6 %   HbA1c, POC (prediabetic range)     HbA1c, POC (controlled diabetic range)    Results for orders placed or performed in  visit on 09/20/22  CBC and differential  Result Value Ref Range   Hemoglobin 14.9 12.0 - 16.0   HCT 47 (A) 36 - 46   Platelets 281 150 - 400 K/uL   WBC 7.8   CBC  Result Value Ref Range   RBC 5.04 3.87 - 5.11  Basic metabolic panel  Result Value Ref Range   Glucose 100    BUN 15 4 - 21   CO2 23 (A) 13 - 22   Creatinine 0.7 0.5 - 1.1   Potassium 5.0 3.5 - 5.1 mEq/L   Sodium 132 (A) 137 - 147   Chloride 95 (A) 99 - 108  Comprehensive metabolic panel  Result Value Ref Range   Globulin 2.5    eGFR 83    Calcium 9.4 8.7 - 10.7   Albumin 4.5 3.5 - 5.0  Hepatic function panel  Result Value Ref Range   Alkaline Phosphatase 91 25 - 125   ALT 21 7 - 35 U/L   AST 29 13 - 35   Bilirubin, Total 0.5       Assessment & Plan:  Recent unintentional weight loss over several months -  POCT glycosylated hemoglobin (Hb A1C)  Type 2 diabetes mellitus without complication, without long-term current use of insulin (HCC) Assessment & Plan: Patient was prediabetic for a long time, A1c on 06/26/2022 was 6.1.  Given recent weight loss, I did want to check A1c as hyperglycemia can cause weight loss.  A1c was 6.7 meeting criteria for diagnosis of type 2 diabetes.  We discussed that we do not need to start medication at this time, but will be important to limit foods that are high in sugar/carbohydrates.  When eating most foods, pairing them with fiber, protein, or healthy fats can decrease the impact on blood sugars as well.  Patient verbalized understanding.  She is glad to have an answer to part of the puzzle regarding her recent symptoms.   Reviewed lab results to CMP and CBC.  Reviewed point-of-care hemoglobin A1c.  We also discussed that weight loss may be secondary to loss of appetite with illness the past few months and the grieving process.  I recommended protein shakes to increase caloric and protein intake without increasing volume of food to an intolerable level.  Patient verbalized  understanding, she will stop by the store to pick some up.  Return if symptoms worsen or fail to improve.  Melida Quitter, PA

## 2022-09-24 DIAGNOSIS — H353131 Nonexudative age-related macular degeneration, bilateral, early dry stage: Secondary | ICD-10-CM | POA: Diagnosis not present

## 2022-09-24 DIAGNOSIS — Z961 Presence of intraocular lens: Secondary | ICD-10-CM | POA: Diagnosis not present

## 2022-09-24 DIAGNOSIS — H26492 Other secondary cataract, left eye: Secondary | ICD-10-CM | POA: Diagnosis not present

## 2022-09-24 DIAGNOSIS — H401133 Primary open-angle glaucoma, bilateral, severe stage: Secondary | ICD-10-CM | POA: Diagnosis not present

## 2022-10-30 ENCOUNTER — Other Ambulatory Visit: Payer: Self-pay | Admitting: Family Medicine

## 2022-12-12 ENCOUNTER — Other Ambulatory Visit: Payer: Self-pay | Admitting: Internal Medicine

## 2022-12-14 ENCOUNTER — Ambulatory Visit: Payer: Medicare HMO | Attending: Internal Medicine | Admitting: Internal Medicine

## 2022-12-14 ENCOUNTER — Encounter: Payer: Self-pay | Admitting: Internal Medicine

## 2022-12-14 VITALS — BP 124/66 | HR 76 | Ht 61.0 in | Wt 111.5 lb

## 2022-12-14 DIAGNOSIS — I25118 Atherosclerotic heart disease of native coronary artery with other forms of angina pectoris: Secondary | ICD-10-CM | POA: Diagnosis not present

## 2022-12-14 DIAGNOSIS — E785 Hyperlipidemia, unspecified: Secondary | ICD-10-CM

## 2022-12-14 DIAGNOSIS — I739 Peripheral vascular disease, unspecified: Secondary | ICD-10-CM | POA: Diagnosis not present

## 2022-12-14 DIAGNOSIS — I771 Stricture of artery: Secondary | ICD-10-CM

## 2022-12-14 DIAGNOSIS — R030 Elevated blood-pressure reading, without diagnosis of hypertension: Secondary | ICD-10-CM | POA: Diagnosis not present

## 2022-12-14 NOTE — Progress Notes (Unsigned)
Cardiology Office Note:  .   Date:  12/16/2022  ID:  [name], DOB Nov 27, 1945, MRN [id number] PCP: Melida Quitter, PA  [facility] Providers Cardiologist:  Yvonne Kendall, MD     History of Present Illness: .   Discussed the use of AI scribe software for clinical note transcription with the patient, who gave verbal consent to proceed.  [name] is a 77 y.o. female with history of nonobstructive coronary artery disease, peripheral vascular disease with right vertebral artery occlusion and left subclavian artery stenosis status post stenting (2018) followed by Dr. Myra Gianotti, stroke, hyperlipidemia, hypothyroidism, GERD, who presents for follow-up of coronary artery disease.  I last saw her in 04/2022, which time she reported 3 episodes of light headedness and feeling off balance over the preceding month.  She did not have any focal neurologic deficits.  She also denied chest pain, shortness of breath, palpitations, and edema.  Orthostatic vital signs were negative.  We agreed to check labs, which were unrevealing.  I encouraged her to follow-up with Dr. Myra Gianotti for ongoing follow-up of her cerebrovascular disease.  Today, [name] reports that she has had a challenging year marked by the loss of her husband and a severe reaction to a cortisone injection for bursitis in the left hip. The cortisone injection, administered in June, led to a significant immune response, weight loss, and a prolonged recovery period of approximately four months. Despite the adverse reaction, the patient reports that the injection did alleviate the bursitis symptoms, which had previously been so severe that she had difficulty lifting her leg. [name] also reports changes in vision, which she attributes to her known glaucoma and macular degeneration. She denies any chest pain, significant shortness of breath, swelling in the legs, lightheadedness, dizziness, numbness, weakness, or trouble speaking. She  does report occasional heart palpitations.  [name] remains active, engaging in yoga, yard work, and walking, and continues to volunteer at the hospital. She admits to a preference for salty foods and does not regularly monitor her sodium intake. Her blood pressure readings have been borderline high, but she expresses a desire to avoid medication if possible.     ROS: See HPI  Studies Reviewed: Marland Kitchen   EKG Interpretation Date/Time:  Friday December 14 2022 15:26:59 EDT Ventricular Rate:  76 PR Interval:  170 QRS Duration:  76 QT Interval:  368 QTC Calculation: 414 R Axis:   44  Text Interpretation: Normal sinus rhythm Septal infarct (cited on or before 19-Apr-2017) When compared with ECG of 12-Apr-2022 No significant change was found Confirmed by Nathalya Wolanski ([id number]) on 12/16/2022 11:07:53 AM    Carotid Doppler (04/23/2022): Mild bilateral ICA stenoses (less than 40%).  Left vertebral artery demonstrates antegrade flow.  Right vertebral artery is occluded.  Normal flow hemodynamics seen in both subclavian arteries with a patent left subclavian artery stent.  Risk Assessment/Calculations:        Physical Exam:   VS:  BP 124/66   Pulse 76   Ht 5\' 1"  (1.549 m)   Wt 111 lb 8 oz (50.6 kg)   SpO2 96%   BMI 21.07 kg/m    Wt Readings from Last 3 Encounters:  12/14/22 111 lb 8 oz (50.6 kg)  09/20/22 109 lb (49.4 kg)  09/11/22 106 lb 13 oz (48.5 kg)    General:  NAD. Neck: No JVD or HJR. Lungs: Clear to auscultation bilaterally without wheezes or crackles. Heart: Regular rate and rhythm without murmurs, rubs,  or gallops. Abdomen: Soft, nontender, nondistended. Extremities: No lower extremity edema.  ASSESSMENT AND PLAN: .    Coronary artery disease with stable angina: Ms. Shiffler continues to do well from a heart standpoint without recurrent angina.  We will continue isosorbide mononitrate for antianginal therapy as well as secondary prevention with clopidogrel and  rosuvastatin.  Peripheral arterial disease: No symptoms to suggest worsening cerebrovascular or left subclavian artery disease.  Doppler in 04/2022 showed patent left subclavian artery stent and known chronic occlusion of the right vertebral artery.  Continue clopidogrel and statin therapy for secondary prevention.  Elevated blood pressure reading: Initial blood pressure was mildly elevated, normalized on recheck.  We will defer pharmacotherapy at this time.  I have encouraged Ms. Gunnerson to reduce her sodium intake.  Hyperlipidemia: Continue rosuvastatin 40 mg daily, as LDL was at goal on last check in February at 59.    Dispo: Return to clinic in 1 year.  Signed, Yvonne Kendall, MD

## 2022-12-14 NOTE — Patient Instructions (Signed)
Medication Instructions:  Your physician recommends that you continue on your current medications as directed. Please refer to the Current Medication list given to you today.   *If you need a refill on your cardiac medications before your next appointment, please call your pharmacy*   Lab Work: No labs ordered today    Testing/Procedures: No test ordered today    Follow-Up: At [facility], you and your health needs are our priority.  As part of our continuing mission to provide you with exceptional heart care, we have created designated Provider Care Teams.  These Care Teams include your primary Cardiologist (physician) and Advanced Practice Providers (APPs -  Physician Assistants and Nurse Practitioners) who all work together to provide you with the care you need, when you need it.  We recommend signing up for the patient portal called "MyChart".  Sign up information is provided on this After Visit Summary.  MyChart is used to connect with patients for Virtual Visits (Telemedicine).  Patients are able to view lab/test results, encounter notes, upcoming appointments, etc.  Non-urgent messages can be sent to your provider as well.   To learn more about what you can do with MyChart, go to [email or web address].    Your next appointment:   1 year(s)  Provider:   You may see Yvonne Kendall, MD or one of the following Advanced Practice Providers on your designated Care Team:   Nicolasa Ducking, NP Eula Listen, PA-C Cadence Fransico Michael, PA-C Charlsie Quest, NP

## 2022-12-14 NOTE — Telephone Encounter (Signed)
last visit 04/12/22 with plan to f/u in 6 months, please schedule.  Thanks

## 2022-12-16 ENCOUNTER — Encounter: Payer: Self-pay | Admitting: Internal Medicine

## 2022-12-24 ENCOUNTER — Other Ambulatory Visit: Payer: Self-pay

## 2022-12-24 DIAGNOSIS — E785 Hyperlipidemia, unspecified: Secondary | ICD-10-CM

## 2022-12-25 ENCOUNTER — Other Ambulatory Visit: Payer: Self-pay | Admitting: Nurse Practitioner

## 2022-12-25 ENCOUNTER — Other Ambulatory Visit: Payer: Medicare HMO

## 2022-12-25 DIAGNOSIS — E785 Hyperlipidemia, unspecified: Secondary | ICD-10-CM | POA: Diagnosis not present

## 2022-12-25 DIAGNOSIS — Z1231 Encounter for screening mammogram for malignant neoplasm of breast: Secondary | ICD-10-CM

## 2022-12-26 LAB — COMPREHENSIVE METABOLIC PANEL
ALT: 22 [IU]/L (ref 0–32)
AST: 32 [IU]/L (ref 0–40)
Albumin: 4.9 g/dL — ABNORMAL HIGH (ref 3.8–4.8)
Alkaline Phosphatase: 111 [IU]/L (ref 44–121)
BUN/Creatinine Ratio: 19 (ref 12–28)
BUN: 14 mg/dL (ref 8–27)
Bilirubin Total: 0.6 mg/dL (ref 0.0–1.2)
CO2: 24 mmol/L (ref 20–29)
Calcium: 10.3 mg/dL (ref 8.7–10.3)
Chloride: 98 mmol/L (ref 96–106)
Creatinine, Ser: 0.74 mg/dL (ref 0.57–1.00)
Globulin, Total: 2.9 g/dL (ref 1.5–4.5)
Glucose: 104 mg/dL — ABNORMAL HIGH (ref 70–99)
Potassium: 4.9 mmol/L (ref 3.5–5.2)
Sodium: 137 mmol/L (ref 134–144)
Total Protein: 7.8 g/dL (ref 6.0–8.5)
eGFR: 83 mL/min/{1.73_m2} (ref >59)

## 2022-12-26 LAB — CBC WITH DIFFERENTIAL/PLATELET
Basophils Absolute: 0 10*3/uL (ref 0.0–0.2)
Basos: 1 %
EOS (ABSOLUTE): 0.1 10*3/uL (ref 0.0–0.4)
Eos: 2 %
Hematocrit: 47.1 % — ABNORMAL HIGH (ref 34.0–46.6)
Hemoglobin: 15.2 g/dL (ref 11.1–15.9)
Immature Grans (Abs): 0 10*3/uL (ref 0.0–0.1)
Immature Granulocytes: 0 %
Lymphocytes Absolute: 1.5 10*3/uL (ref 0.7–3.1)
Lymphs: 25 %
MCH: 30.7 pg (ref 26.6–33.0)
MCHC: 32.3 g/dL (ref 31.5–35.7)
MCV: 95 fL (ref 79–97)
Monocytes Absolute: 0.7 10*3/uL (ref 0.1–0.9)
Monocytes: 11 %
Neutrophils Absolute: 3.8 10*3/uL (ref 1.4–7.0)
Neutrophils: 61 %
Platelets: 294 10*3/uL (ref 150–450)
RBC: 4.95 x10E6/uL (ref 3.77–5.28)
RDW: 11.8 % (ref 11.7–15.4)
WBC: 6.2 10*3/uL (ref 3.4–10.8)

## 2022-12-26 LAB — LIPID PANEL
Chol/HDL Ratio: 2.1 {ratio} (ref 0.0–4.4)
Cholesterol, Total: 161 mg/dL (ref 100–199)
HDL: 76 mg/dL (ref >39)
LDL Chol Calc (NIH): 74 mg/dL (ref 0–99)
Triglycerides: 51 mg/dL (ref 0–149)
VLDL Cholesterol Cal: 11 mg/dL (ref 5–40)

## 2022-12-26 LAB — TSH+FREE T4
Free T4: 1.68 ng/dL (ref 0.82–1.77)
TSH: 1.94 u[IU]/mL (ref 0.450–4.500)

## 2023-01-01 ENCOUNTER — Encounter: Payer: Self-pay | Admitting: Family Medicine

## 2023-01-01 ENCOUNTER — Ambulatory Visit (INDEPENDENT_AMBULATORY_CARE_PROVIDER_SITE_OTHER): Payer: Medicare HMO | Admitting: Family Medicine

## 2023-01-01 VITALS — BP 113/63 | HR 80 | Resp 18 | Ht 61.0 in | Wt 112.0 lb

## 2023-01-01 DIAGNOSIS — Z1159 Encounter for screening for other viral diseases: Secondary | ICD-10-CM

## 2023-01-01 DIAGNOSIS — I25118 Atherosclerotic heart disease of native coronary artery with other forms of angina pectoris: Secondary | ICD-10-CM

## 2023-01-01 DIAGNOSIS — M25511 Pain in right shoulder: Secondary | ICD-10-CM | POA: Diagnosis not present

## 2023-01-01 DIAGNOSIS — M25512 Pain in left shoulder: Secondary | ICD-10-CM

## 2023-01-01 DIAGNOSIS — E785 Hyperlipidemia, unspecified: Secondary | ICD-10-CM

## 2023-01-01 DIAGNOSIS — R7303 Prediabetes: Secondary | ICD-10-CM | POA: Diagnosis not present

## 2023-01-01 DIAGNOSIS — E890 Postprocedural hypothyroidism: Secondary | ICD-10-CM

## 2023-01-01 LAB — POCT GLYCOSYLATED HEMOGLOBIN (HGB A1C): HbA1c POC (<> result, manual entry): 5.9 % (ref 4.0–5.6)

## 2023-01-01 LAB — POCT UA - MICROALBUMIN
Albumin/Creatinine Ratio, Urine, POC: <300
Creatinine, POC: 100 mg/dL
Microalbumin Ur, POC: 10 mg/L

## 2023-01-01 MED ORDER — CLOPIDOGREL BISULFATE 75 MG PO TABS: 75.0000 mg | ORAL_TABLET | Freq: Every day | ORAL | 1 refills | Status: DC

## 2023-01-01 NOTE — Assessment & Plan Note (Signed)
Last lipid panel: LDL 74, HDL 76, triglycerides 51.  Continue rosuvastatin 40 mg daily.  Will continue to monitor.

## 2023-01-01 NOTE — Progress Notes (Signed)
Established Patient Office Visit  Subjective   Patient ID: [name], female    DOB: 04/02/1945  Age: 77 y.o. MRN: [id number]  Chief Complaint  Patient presents with   Diabetes   Hyperlipidemia    HPI [name] is a 77 y.o. female presenting today for follow up of hyperlipidemia, prediabetes, hypothyroidism.  She also experienced acute onset bilateral shoulder pain about 2 months ago, L>R.  It is especially painful reaching above her head.  It tends to be worse in the morning and improve over the course of the day.  She has been taking Tylenol with some relief.  She does not feel that it warrants imaging or orthopedic referral at this time, but wanted it noted by primary care. Hyperlipidemia: tolerating rosuvastatin 40 mg daily well with no myalgias or significant side effects.  The ASCVD Risk score (Arnett DK, et al., 2019) failed to calculate for the following reasons:   The patient has a prior MI or stroke diagnosis Prediabetes: denies hypoglycemic events, wounds or sores that are not healing well, increased thirst or urination.  Hypothyroidism: Taking levothyroxine 75 mcg and liothyronine 5 mcg regularly in the AM away from food and vitamins. Denies fatigue, weight changes, heat/cold intolerance, skin/hair changes, bowel changes, CVS symptoms.   Outpatient Medications Prior to Visit  Medication Sig   acetaminophen (TYLENOL) 500 MG tablet Take 500 mg by mouth daily as needed for moderate pain or headache.    Calcium Citrate-Vitamin D (CALCIUM CITRATE + PO) Take by mouth daily at 12 noon.   diazepam (VALIUM) 5 MG tablet Take 1 tablet (5 mg total) by mouth at bedtime as needed for anxiety (sleep).   dorzolamide-timolol (COSOPT) 22.3-6.8 MG/ML ophthalmic solution Place into both eyes daily.   isosorbide mononitrate (IMDUR) 30 MG 24 hr tablet Take 0.5 tablets (15 mg total) by mouth daily.   latanoprost (XALATAN) 0.005 % ophthalmic solution 1 drop at bedtime.   levothyroxine  (SYNTHROID) 75 MCG tablet Take 75 mcg by mouth every morning.   liothyronine (CYTOMEL) 5 MCG tablet Take 5 mcg by mouth. Takes 4 times weekly.   Lutein 6 MG TABS Takes four times a week   Multiple Vitamin (MULTIVITAMIN) tablet Take 1 tablet by mouth daily.   rosuvastatin (CRESTOR) 40 MG tablet Take 1 tablet (40 mg total) by mouth daily. Due for follow up visit.  PLEASE CALL OFFICE TO SCHEDULE APPOINTMENT PRIOR TO NEXT REFILL   [DISCONTINUED] clopidogrel (PLAVIX) 75 MG tablet TAKE 1 TABLET BY MOUTH EVERY DAY   No facility-administered medications prior to visit.    ROS Negative unless otherwise noted in HPI   Objective:     BP 113/63 (BP Location: Left Arm, Patient Position: Sitting, Cuff Size: Normal)   Pulse 80   Resp 18   Ht 5\' 1"  (1.549 m)   Wt 112 lb (50.8 kg)   SpO2 97%   BMI 21.16 kg/m   Physical Exam Constitutional:      General: She is not in acute distress.    Appearance: Normal appearance.  HENT:     Head: Normocephalic and atraumatic.  Cardiovascular:     Rate and Rhythm: Normal rate and regular rhythm.     Heart sounds: No murmur heard.    No friction rub. No gallop.  Pulmonary:     Effort: Pulmonary effort is normal. No respiratory distress.     Breath sounds: No wheezing, rhonchi or rales.  Skin:    General: Skin is warm  and dry.  Neurological:     Mental Status: She is alert and oriented to person, place, and time.    Results for orders placed or performed in visit on 01/01/23  POCT HgB A1C  Result Value Ref Range   Hemoglobin A1C     HbA1c POC (<> result, manual entry) 5.9 4.0 - 5.6 %   HbA1c, POC (prediabetic range)     HbA1c, POC (controlled diabetic range)    POCT UA - Microalbumin  Result Value Ref Range   Microalbumin Ur, POC 10 mg/L   Creatinine, POC 100 mg/dL   Albumin/Creatinine Ratio, Urine, POC <300      Assessment & Plan:  Hyperlipidemia LDL goal <70 Assessment & Plan: Last lipid panel: LDL 74, HDL 76, triglycerides 51.  Continue  rosuvastatin 40 mg daily.  Will continue to monitor.   Postoperative hypothyroidism Assessment & Plan: TSH and T4 within normal limits.  Continue levothyroxine 75 mcg daily, liothyronine 5 mcg daily.  Will continue to monitor.   Coronary artery disease of native artery of native heart with stable angina pectoris [facility]) Assessment & Plan: Followed by cardiology, most recent appointment on 12/14/2022.  Continue clopidogrel 75 mg daily, isosorbide mononitrate 30 mg daily, rosuvastatin 40 mg daily.  Will continue to monitor and coordination with cardiology.  Orders: -     Clopidogrel Bisulfate; Take 1 tablet (75 mg total) by mouth daily.  Dispense: 90 tablet; Refill: 1  Prediabetes Assessment & Plan: A1c 6.7 on 09/20/2022, likely secondary to cortisone joint injection.  Point-of-care A1c today returned to baseline of 5.9.  Will continue to monitor.  Discussed additional strategies of managing blood sugar levels through nutrition and physical activity.  Orders: -     POCT glycosylated hemoglobin (Hb A1C) -     POCT UA - Microalbumin  Bilateral shoulder pain, unspecified chronicity  CMP within normal limits with the exception of a slightly elevated albumin at 4.9.  CBC essentially within normal limits other than hematocrit 47.1.  TSH and T4 within normal limits, lipid panel: LDL 74, HDL 76, triglycerides 51.  Return in about 6 months (around 07/02/2023) for follow-up for HTN, HLD, fasting blood work 1 week before.    Jamie Quitter, PA

## 2023-01-01 NOTE — Assessment & Plan Note (Signed)
Followed by cardiology, most recent appointment on 12/14/2022.  Continue clopidogrel 75 mg daily, isosorbide mononitrate 30 mg daily, rosuvastatin 40 mg daily.  Will continue to monitor and coordination with cardiology.

## 2023-01-01 NOTE — Assessment & Plan Note (Signed)
TSH and T4 within normal limits.  Continue levothyroxine 75 mcg daily, liothyronine 5 mcg daily.  Will continue to monitor.

## 2023-01-01 NOTE — Assessment & Plan Note (Addendum)
A1c 6.7 on 09/20/2022, likely secondary to cortisone joint injection.  Point-of-care A1c today returned to baseline of 5.9.  Will continue to monitor.  Discussed additional strategies of managing blood sugar levels through nutrition and physical activity.

## 2023-01-21 DIAGNOSIS — H401133 Primary open-angle glaucoma, bilateral, severe stage: Secondary | ICD-10-CM | POA: Diagnosis not present

## 2023-01-29 DIAGNOSIS — H353131 Nonexudative age-related macular degeneration, bilateral, early dry stage: Secondary | ICD-10-CM | POA: Diagnosis not present

## 2023-01-29 DIAGNOSIS — Z961 Presence of intraocular lens: Secondary | ICD-10-CM | POA: Diagnosis not present

## 2023-02-01 ENCOUNTER — Ambulatory Visit
Admission: RE | Admit: 2023-02-01 | Discharge: 2023-02-01 | Disposition: A | Discharge status: Home / Self Care | Payer: Medicare HMO | Source: Ambulatory Visit | Attending: Nurse Practitioner | Admitting: Nurse Practitioner

## 2023-02-01 DIAGNOSIS — Z1231 Encounter for screening mammogram for malignant neoplasm of breast: Secondary | ICD-10-CM

## 2023-02-04 NOTE — Progress Notes (Signed)
Hey. This patient has you listed as PCP.

## 2023-03-15 DIAGNOSIS — H401133 Primary open-angle glaucoma, bilateral, severe stage: Secondary | ICD-10-CM | POA: Diagnosis not present

## 2023-04-08 ENCOUNTER — Ambulatory Visit: Payer: Medicare HMO | Admitting: Podiatry

## 2023-04-15 ENCOUNTER — Other Ambulatory Visit: Payer: Self-pay

## 2023-04-15 DIAGNOSIS — I771 Stricture of artery: Secondary | ICD-10-CM

## 2023-04-15 DIAGNOSIS — I6529 Occlusion and stenosis of unspecified carotid artery: Secondary | ICD-10-CM

## 2023-04-18 ENCOUNTER — Encounter: Payer: Self-pay | Admitting: Family Medicine

## 2023-04-19 ENCOUNTER — Other Ambulatory Visit: Payer: Self-pay | Admitting: Internal Medicine

## 2023-04-19 NOTE — Telephone Encounter (Signed)
Please contact pt for future appointment. Pt overdue for follow up pt needing refills.

## 2023-04-26 DIAGNOSIS — H401133 Primary open-angle glaucoma, bilateral, severe stage: Secondary | ICD-10-CM | POA: Diagnosis not present

## 2023-04-29 ENCOUNTER — Ambulatory Visit (HOSPITAL_COMMUNITY)
Admission: RE | Admit: 2023-04-29 | Discharge: 2023-04-29 | Disposition: A | Discharge status: Home / Self Care | Payer: Medicare HMO | Source: Ambulatory Visit | Attending: Surgery | Admitting: Surgery

## 2023-04-29 ENCOUNTER — Ambulatory Visit (INDEPENDENT_AMBULATORY_CARE_PROVIDER_SITE_OTHER): Payer: Medicare HMO | Admitting: Physician Assistant

## 2023-04-29 VITALS — BP 135/67 | HR 68 | Temp 98.0°F | Wt 116.2 lb

## 2023-04-29 DIAGNOSIS — I6529 Occlusion and stenosis of unspecified carotid artery: Secondary | ICD-10-CM | POA: Diagnosis not present

## 2023-04-29 DIAGNOSIS — I771 Stricture of artery: Secondary | ICD-10-CM | POA: Insufficient documentation

## 2023-04-29 DIAGNOSIS — I6523 Occlusion and stenosis of bilateral carotid arteries: Secondary | ICD-10-CM | POA: Diagnosis not present

## 2023-04-29 NOTE — Progress Notes (Signed)
Office Note   History of Present Illness   [name] is a 78 y.o. (1946/02/10) female who presents for surveillance of carotid artery stenosis and left subclavian artery stenosis.  She was initially seen by Dr. Myra Gianotti in 2018 for asymmetric blood pressures and eventually underwent stenting of the left subclavian artery on 03/03/2017.  She also has a remote history of stroke in October 2016 due to a right vertebral artery occlusion.  She returns today for follow-up and says that she is doing well.  She denies any strokelike symptoms such as sudden changes to vision, sudden weakness/numbness, facial droop, or slurred speech.  She also denies any sudden dizziness.  She denies any claudication, rest pain, or tissue loss in the left upper extremity.  She has been having pain in the right side of her neck extending down her right arm for the past 2 months.  She says this feels like her previous cervical spine pain she has had in the past.  This does not limit her activities at this time.   Current Outpatient Medications  Medication Sig Dispense Refill   acetaminophen (TYLENOL) 500 MG tablet Take 500 mg by mouth daily as needed for moderate pain or headache.      Calcium Citrate-Vitamin D (CALCIUM CITRATE + PO) Take by mouth daily at 12 noon.     clopidogrel (PLAVIX) 75 MG tablet Take 1 tablet (75 mg total) by mouth daily. 90 tablet 1   diazepam (VALIUM) 5 MG tablet Take 1 tablet (5 mg total) by mouth at bedtime as needed for anxiety (sleep). 30 tablet 0   dorzolamide-timolol (COSOPT) 22.3-6.8 MG/ML ophthalmic solution Place into both eyes daily.     isosorbide mononitrate (IMDUR) 30 MG 24 hr tablet Take 0.5 tablets (15 mg total) by mouth daily. 45 tablet 3   latanoprost (XALATAN) 0.005 % ophthalmic solution 1 drop at bedtime.     levothyroxine (SYNTHROID) 75 MCG tablet Take 75 mcg by mouth every morning.     liothyronine (CYTOMEL) 5 MCG tablet Take 5 mcg by mouth. Takes 4 times weekly.      Lutein 6 MG TABS Take 6 mg by mouth daily. Takes four times a week     Multiple Vitamin (MULTIVITAMIN) tablet Take 1 tablet by mouth daily.     rosuvastatin (CRESTOR) 40 MG tablet Take 1 tablet (40 mg total) by mouth daily. 90 tablet 2   No current facility-administered medications for this visit.    REVIEW OF SYSTEMS (negative unless checked):   Cardiac:  []  Chest pain or chest pressure? []  Shortness of breath upon activity? []  Shortness of breath when lying flat? []  Irregular heart rhythm?  Vascular:  []  Pain in calf, thigh, or hip brought on by walking? []  Pain in feet at night that wakes you up from your sleep? []  Blood clot in your veins? []  Leg swelling?  Pulmonary:  []  Oxygen at home? []  Productive cough? []  Wheezing?  Neurologic:  []  Sudden weakness in arms or legs? []  Sudden numbness in arms or legs? []  Sudden onset of difficult speaking or slurred speech? []  Temporary loss of vision in one eye? []  Problems with dizziness?  Gastrointestinal:  []  Blood in stool? []  Vomited blood?  Genitourinary:  []  Burning when urinating? []  Blood in urine?  Psychiatric:  []  Major depression  Hematologic:  []  Bleeding problems? []  Problems with blood clotting?  Dermatologic:  []  Rashes or ulcers?  Constitutional:  []  Fever or chills?  Ear/Nose/Throat:  []   Change in hearing? []  Nose bleeds? []  Sore throat?  Musculoskeletal:  []  Back pain? [x]  Joint pain? []  Muscle pain?   Physical Examination   Vitals:   04/29/23 1348  BP: 135/67  Pulse: 68  Temp: 98 F (36.7 C)  SpO2: 97%  Weight: 116 lb 3.2 oz (52.7 kg)   Body mass index is 21.96 kg/m.  General:  WDWN in NAD; vital signs documented above Gait: Not observed HENT: WNL, normocephalic Pulmonary: normal non-labored breathing , without rales, rhonchi,  wheezing Cardiac: regular, without carotid bruit Abdomen: soft, NT, no masses Skin: without rashes Vascular Exam/Pulses: palpable radial pulses  bilaterally Extremities: without ischemic changes, without gangrene , without cellulitis; without open wounds;  Musculoskeletal: no muscle wasting or atrophy  Neurologic: A&O X 3;  No focal weakness or paresthesias are detected Psychiatric:  The pt has Normal affect.  Non-Invasive Vascular Imaging   Bilateral Carotid Duplex (04/29/2023):  R ICA stenosis:  1-39% R VA:  patent and antegrade L ICA stenosis:  1-39% L VA:  patent and antegrade Patent left subclavian stent   Medical Decision Making   [name] is a 78 y.o. female who presents for surveillance of carotid artery stenosis  Based on the patient's vascular studies, her carotid artery stenosis is unchanged at 1-39% bilaterally. Her left subclavian artery stent is patent without stenosis She denies any strokelike symptoms such as slurred speech, facial droop, sudden visual changes, or sudden weakness/numbness.  She denies any left arm claudication, rest pain, or tissue loss.  She has no carotid bruits on exam.  She has palpable and equal radial pulses bilaterally She can follow up with our office in 1 year with carotid duplex   Loel Dubonnet PA-C [facility] Office: [phone]  Call MD: Chestine Spore

## 2023-04-30 ENCOUNTER — Other Ambulatory Visit: Payer: Self-pay | Admitting: Internal Medicine

## 2023-05-02 ENCOUNTER — Ambulatory Visit: Payer: Medicare HMO | Admitting: Cardiology

## 2023-05-27 ENCOUNTER — Telehealth: Payer: Self-pay | Admitting: *Deleted

## 2023-05-27 NOTE — Telephone Encounter (Unsigned)
 Copied from CRM [id number]. Topic: Clinical - Medication Question >> May 27, 2023 10:04 AM Martha Clan wrote: Reason for CRM: clopidogrel (PLAVIX) 75 MG tablet [[id number]]  Patient is having eye surgery and needs to know if she is okay without taking it for the one day. Please call patient to give information.

## 2023-05-28 DIAGNOSIS — Z8673 Personal history of transient ischemic attack (TIA), and cerebral infarction without residual deficits: Secondary | ICD-10-CM | POA: Diagnosis not present

## 2023-05-28 DIAGNOSIS — Z7902 Long term (current) use of antithrombotics/antiplatelets: Secondary | ICD-10-CM | POA: Diagnosis not present

## 2023-05-28 DIAGNOSIS — Z885 Allergy status to narcotic agent status: Secondary | ICD-10-CM | POA: Diagnosis not present

## 2023-05-28 DIAGNOSIS — K219 Gastro-esophageal reflux disease without esophagitis: Secondary | ICD-10-CM | POA: Diagnosis not present

## 2023-05-28 DIAGNOSIS — Z888 Allergy status to other drugs, medicaments and biological substances status: Secondary | ICD-10-CM | POA: Diagnosis not present

## 2023-05-28 DIAGNOSIS — E079 Disorder of thyroid, unspecified: Secondary | ICD-10-CM | POA: Diagnosis not present

## 2023-05-28 DIAGNOSIS — H401123 Primary open-angle glaucoma, left eye, severe stage: Secondary | ICD-10-CM | POA: Diagnosis not present

## 2023-05-28 DIAGNOSIS — H401120 Primary open-angle glaucoma, left eye, stage unspecified: Secondary | ICD-10-CM | POA: Diagnosis not present

## 2023-05-28 DIAGNOSIS — Z79899 Other long term (current) drug therapy: Secondary | ICD-10-CM | POA: Diagnosis not present

## 2023-05-28 DIAGNOSIS — Z79890 Hormone replacement therapy (postmenopausal): Secondary | ICD-10-CM | POA: Diagnosis not present

## 2023-05-28 DIAGNOSIS — Z87891 Personal history of nicotine dependence: Secondary | ICD-10-CM | POA: Diagnosis not present

## 2023-05-28 DIAGNOSIS — E7800 Pure hypercholesterolemia, unspecified: Secondary | ICD-10-CM | POA: Diagnosis not present

## 2023-05-28 NOTE — Telephone Encounter (Signed)
 Please call the patient and ask her the type of eye surgery she is having.  Ask her if she talked about holding the Plavix prior to surgery with the ophthalmologist and what their recommendation was.  Generally they will talk to the patients about what medications to hold prior to the procedure.  Plavix is generally held for 5 days prior to a surgery

## 2023-05-28 NOTE — Telephone Encounter (Signed)
 LVM to call office to ask her the below information. Will also send her a mychart message about this as well.

## 2023-06-25 ENCOUNTER — Other Ambulatory Visit: Payer: Medicare HMO

## 2023-06-25 DIAGNOSIS — R7303 Prediabetes: Secondary | ICD-10-CM | POA: Diagnosis not present

## 2023-06-25 DIAGNOSIS — E890 Postprocedural hypothyroidism: Secondary | ICD-10-CM | POA: Diagnosis not present

## 2023-06-25 DIAGNOSIS — E785 Hyperlipidemia, unspecified: Secondary | ICD-10-CM | POA: Diagnosis not present

## 2023-06-25 DIAGNOSIS — Z1159 Encounter for screening for other viral diseases: Secondary | ICD-10-CM | POA: Diagnosis not present

## 2023-06-26 LAB — CBC WITH DIFFERENTIAL/PLATELET
Basophils Absolute: 0 10*3/uL (ref 0.0–0.2)
Basos: 1 %
EOS (ABSOLUTE): 0.1 10*3/uL (ref 0.0–0.4)
Eos: 2 %
Hematocrit: 46.4 % (ref 34.0–46.6)
Hemoglobin: 15.4 g/dL (ref 11.1–15.9)
Immature Grans (Abs): 0 10*3/uL (ref 0.0–0.1)
Immature Granulocytes: 0 %
Lymphocytes Absolute: 1.7 10*3/uL (ref 0.7–3.1)
Lymphs: 27 %
MCH: 31 pg (ref 26.6–33.0)
MCHC: 33.2 g/dL (ref 31.5–35.7)
MCV: 94 fL (ref 79–97)
Monocytes Absolute: 0.7 10*3/uL (ref 0.1–0.9)
Monocytes: 12 %
Neutrophils Absolute: 3.7 10*3/uL (ref 1.4–7.0)
Neutrophils: 58 %
Platelets: 251 10*3/uL (ref 150–450)
RBC: 4.96 x10E6/uL (ref 3.77–5.28)
RDW: 11.8 % (ref 11.7–15.4)
WBC: 6.3 10*3/uL (ref 3.4–10.8)

## 2023-06-26 LAB — LIPID PANEL
Chol/HDL Ratio: 2.1 ratio (ref 0.0–4.4)
Cholesterol, Total: 159 mg/dL (ref 100–199)
HDL: 75 mg/dL (ref >39)
LDL Chol Calc (NIH): 71 mg/dL (ref 0–99)
Triglycerides: 67 mg/dL (ref 0–149)
VLDL Cholesterol Cal: 13 mg/dL (ref 5–40)

## 2023-06-26 LAB — HEPATITIS C ANTIBODY: Hep C Virus Ab: NONREACTIVE

## 2023-06-26 LAB — HEMOGLOBIN A1C
Est. average glucose Bld gHb Est-mCnc: 126 mg/dL
Hgb A1c MFr Bld: 6 % — ABNORMAL HIGH (ref 4.8–5.6)

## 2023-06-26 LAB — TSH: TSH: 0.557 u[IU]/mL (ref 0.450–4.500)

## 2023-06-26 LAB — COMPREHENSIVE METABOLIC PANEL WITH GFR
ALT: 33 IU/L — ABNORMAL HIGH (ref 0–32)
AST: 33 IU/L (ref 0–40)
Albumin: 5 g/dL — ABNORMAL HIGH (ref 3.8–4.8)
Alkaline Phosphatase: 113 IU/L (ref 44–121)
BUN/Creatinine Ratio: 16 (ref 12–28)
BUN: 11 mg/dL (ref 8–27)
Bilirubin Total: 0.6 mg/dL (ref 0.0–1.2)
CO2: 22 mmol/L (ref 20–29)
Calcium: 9.8 mg/dL (ref 8.7–10.3)
Chloride: 99 mmol/L (ref 96–106)
Creatinine, Ser: 0.7 mg/dL (ref 0.57–1.00)
Globulin, Total: 2.4 g/dL (ref 1.5–4.5)
Glucose: 102 mg/dL — ABNORMAL HIGH (ref 70–99)
Potassium: 4.8 mmol/L (ref 3.5–5.2)
Sodium: 137 mmol/L (ref 134–144)
Total Protein: 7.4 g/dL (ref 6.0–8.5)
eGFR: 88 mL/min/{1.73_m2} (ref >59)

## 2023-06-26 LAB — T4, FREE: Free T4: 1.66 ng/dL (ref 0.82–1.77)

## 2023-07-02 ENCOUNTER — Ambulatory Visit: Payer: Medicare HMO | Admitting: Family Medicine

## 2023-07-03 ENCOUNTER — Ambulatory Visit (INDEPENDENT_AMBULATORY_CARE_PROVIDER_SITE_OTHER): Payer: Medicare HMO | Admitting: Family Medicine

## 2023-07-03 ENCOUNTER — Encounter: Payer: Self-pay | Admitting: Family Medicine

## 2023-07-03 VITALS — BP 129/72 | HR 79 | Ht 61.0 in | Wt 115.1 lb

## 2023-07-03 DIAGNOSIS — H409 Unspecified glaucoma: Secondary | ICD-10-CM

## 2023-07-03 DIAGNOSIS — R7303 Prediabetes: Secondary | ICD-10-CM | POA: Diagnosis not present

## 2023-07-03 DIAGNOSIS — I771 Stricture of artery: Secondary | ICD-10-CM | POA: Diagnosis not present

## 2023-07-03 DIAGNOSIS — I739 Peripheral vascular disease, unspecified: Secondary | ICD-10-CM

## 2023-07-03 DIAGNOSIS — F419 Anxiety disorder, unspecified: Secondary | ICD-10-CM | POA: Diagnosis not present

## 2023-07-03 MED ORDER — DIAZEPAM 5 MG PO TABS: 5.0000 mg | ORAL_TABLET | Freq: Every evening | ORAL | 0 refills | Status: AC | PRN

## 2023-07-03 NOTE — Progress Notes (Signed)
   Established Patient Office Visit  Subjective   Patient ID: [name], female    DOB: 21-Aug-1945  Age: 78 y.o. MRN: [id number]  Chief Complaint  Patient presents with   Medical Management of Chronic Issues    HPI  Subjective: - Routine follow-up visit - Reports no new concerns - Hx of stroke,  - weakness and low BP in one arm. Had subclavian stent placed - Has glaucoma - recently had eye shunt placed ~1 month ago - Has macular degeneration in one eye - Occasionally uses diazepam  for restlessness at night - last refill ~1 year ago  Past Medical History: - Stroke - Coronary artery disease - Subclavian stenosis s/p stent - Glaucoma - Macular degeneration - Hypothyroidism - Pre-diabetes (A1C 6.0) - Fatty liver (noted on CT scan 2022) - Family hx: Sister with fatty liver, brother died from cirrhosis (alcohol-related), father was alcoholic     The ASCVD Risk score (Arnett DK, et al., 2019) failed to calculate for the following reasons:   Risk score cannot be calculated because patient has a medical history suggesting prior/existing ASCVD  Health Maintenance Due  Topic Date Due   DTaP/Tdap/Td (1 - Tdap) Never done   Zoster Vaccines- Shingrix (1 of 2) 11/26/1963   COVID-19 Vaccine (4 - 2024-25 season) 11/11/2022      Objective:     BP 129/72   Pulse 79   Ht 5\' 1"  (1.549 m)   Wt 115 lb 1.9 oz (52.2 kg)   SpO2 92%   BMI 21.75 kg/m    Physical Exam Gen: alert, oriented Heent: perrla.  Eomi.  Shunt in sclera of left eye in 3 oclock position Cv: rrr Pulm: lctab    No results found for any visits on 07/03/23.      Assessment & Plan:   Prediabetes Assessment & Plan: Stable.  Discussed low carb diet.  Recheck yearly   Anxiety -     diazePAM ; Take 1 tablet (5 mg total) by mouth at bedtime as needed for anxiety (sleep).  Dispense: 30 tablet; Refill: 0  Glaucoma, unspecified glaucoma type, unspecified laterality Assessment & Plan: Shunt placed in  left eye.  No concerns today.  Mgmt per ophtho   Subclavian artery stenosis [facility]) Assessment & Plan: Continue plavix  and crestor .  Has stent placed in 2018   PAD (peripheral artery disease) (HCC) Assessment & Plan: Continue plavix  and atorvasrtatin.        Return in about 1 year (around 07/02/2024) for physical.    Laneta Pintos, MD

## 2023-07-03 NOTE — Patient Instructions (Signed)
 It was nice to see you today,  We addressed the following topics today: -I have sent in a refill of your diazepam .  I can send in refills of your other medications as needed. - To lower your A1c and your cholesterol levels you should eat a diet low in carbohydrates/sugar and saturated fat.  Try to keep your saturated fat under 10 g/day.   Have a great day,  Etha Henle, MD

## 2023-07-06 NOTE — Assessment & Plan Note (Signed)
 Continue plavix  and crestor .  Has stent placed in 2018

## 2023-07-06 NOTE — Assessment & Plan Note (Signed)
 Shunt placed in left eye.  No concerns today.  Mgmt per ophtho

## 2023-07-06 NOTE — Assessment & Plan Note (Signed)
 Continue plavix  and atorvasrtatin.

## 2023-07-06 NOTE — Assessment & Plan Note (Signed)
 Stable.  Discussed low carb diet.  Recheck yearly

## 2023-07-29 ENCOUNTER — Other Ambulatory Visit: Payer: Self-pay | Admitting: Family Medicine

## 2023-07-29 DIAGNOSIS — I25118 Atherosclerotic heart disease of native coronary artery with other forms of angina pectoris: Secondary | ICD-10-CM

## 2023-09-02 DIAGNOSIS — E890 Postprocedural hypothyroidism: Secondary | ICD-10-CM | POA: Diagnosis not present

## 2023-09-05 DIAGNOSIS — M810 Age-related osteoporosis without current pathological fracture: Secondary | ICD-10-CM | POA: Diagnosis not present

## 2023-09-05 DIAGNOSIS — R7309 Other abnormal glucose: Secondary | ICD-10-CM | POA: Diagnosis not present

## 2023-09-05 DIAGNOSIS — E890 Postprocedural hypothyroidism: Secondary | ICD-10-CM | POA: Diagnosis not present

## 2023-09-12 ENCOUNTER — Telehealth: Payer: Self-pay | Admitting: *Deleted

## 2023-09-12 ENCOUNTER — Ambulatory Visit: Payer: Medicare HMO

## 2023-09-12 DIAGNOSIS — Z0000 Encounter for general adult medical examination without abnormal findings: Secondary | ICD-10-CM

## 2023-09-12 NOTE — Telephone Encounter (Signed)
 Copied from CRM [id number]. Topic: Appointments - Appointment Cancel/Reschedule >> Sep 12, 2023 10:17 AM Treva T wrote: Patient/patient representative is calling to cancel or reschedule an appointment. Refer to attachments for appointment information.   Patient calling, states she missed a AWV via telephone, accidentally left her phone on silent, and was working in her kitchen and totally missed the calls for appointment. Patient is inquiring if she can be seen at another time today.  Called and spoke to office, Per Sari, will reach out to [place] who called patient for visit to inquire on above. States will follow back up with patient.    Patient can be reached at [phone].   Thank you!

## 2023-09-12 NOTE — Telephone Encounter (Signed)
 This patient was able to have the appointment today.

## 2023-09-12 NOTE — Progress Notes (Signed)
 Subjective:   [name] is a 78 y.o. who presents for a Medicare Wellness preventive visit.  As a reminder, Annual Wellness Visits don't include a physical exam, and some assessments may be limited, especially if this visit is performed virtually. We may recommend an in-person follow-up visit with your provider if needed.  Visit Complete: Virtual I connected with  Richardson RAYMOND Cancer on 09/12/23 by a audio enabled telemedicine application and verified that I am speaking with the correct person using two identifiers.  Patient Location: Home  Provider Location: Home Office  I discussed the limitations of evaluation and management by telemedicine. The patient expressed understanding and agreed to proceed.  Vital Signs: Because this visit was a virtual/telehealth visit, some criteria may be missing or patient reported. Any vitals not documented were not able to be obtained and vitals that have been documented are patient reported.  VideoError- Librarian, academic were attempted between this provider and patient, however failed, due to patient having technical difficulties OR patient did not have access to video capability.  We continued and completed visit with audio only.   Persons Participating in Visit: Patient.  AWV Questionnaire: No: Patient Medicare AWV questionnaire was not completed prior to this visit.  Cardiac Risk Factors include: advanced age (>41men, >44 women);dyslipidemia     Objective:    Today's Vitals   There is no height or weight on file to calculate BMI.     09/12/2023   10:34 AM 09/11/2022    4:19 PM 09/06/2022    9:10 AM 05/16/2018   11:22 AM 02/17/2018    2:39 PM 02/12/2018   10:33 AM 07/26/2017    3:04 PM  Advanced Directives  Does Patient Have a Medical Advance Directive? Yes No Yes Yes  Yes  Yes  No   Type of Estate agent of [place];Living will Healthcare Power of [place];Living will Healthcare Power of  [place];Living will Healthcare Power of [place];Living will Living will;Healthcare Power of [employer] Power of [place];Living will   Does patient want to make changes to medical advance directive?    No - Patient declined   No - Patient declined    Copy of Healthcare Power of Attorney in Chart? No - copy requested  No - copy requested No - copy requested   No - copy requested    Would patient like information on creating a medical advance directive?  No - Patient declined          Data saved with a previous flowsheet row definition    Current Medications (verified) Outpatient Encounter Medications as of 09/12/2023  Medication Sig   acetaminophen  (TYLENOL ) 500 MG tablet Take 500 mg by mouth daily as needed for moderate pain or headache.    Calcium  Citrate-Vitamin D  (CALCIUM  CITRATE + PO) Take by mouth daily at 12 noon.   clopidogrel  (PLAVIX ) 75 MG tablet TAKE 1 TABLET BY MOUTH EVERY DAY   diazepam  (VALIUM ) 5 MG tablet Take 1 tablet (5 mg total) by mouth at bedtime as needed for anxiety (sleep).   dorzolamide -timolol  (COSOPT) 22.3-6.8 MG/ML ophthalmic solution Place into both eyes daily.   isosorbide  mononitrate (IMDUR ) 30 MG 24 hr tablet TAKE 1/2 OF A TABLET (15 MG TOTAL) BY MOUTH DAILY   latanoprost  (XALATAN ) 0.005 % ophthalmic solution 1 drop at bedtime.   levothyroxine (SYNTHROID) 75 MCG tablet Take 75 mcg by mouth every morning.   liothyronine (CYTOMEL) 5 MCG tablet Take 5 mcg by mouth. Takes  4 times weekly.   Lutein 6 MG TABS Take 6 mg by mouth daily. Takes four times a week   Multiple Vitamin (MULTIVITAMIN) tablet Take 1 tablet by mouth daily.   rosuvastatin  (CRESTOR ) 40 MG tablet Take 1 tablet (40 mg total) by mouth daily.   No facility-administered encounter medications on file as of 09/12/2023.    Allergies (verified) Atorvastatin , Neuromuscular blocking agents, and Morphine  and codeine   History: Past Medical History:  Diagnosis Date   Anxiety    Barrett esophagus  2003   Cerebral infarction (HCC) 12/26/2014   Cerebral infarction due to embolism of right vertebral artery (HCC)    Cerebrovascular accident (CVA) (HCC) 12/26/2014   Diverticulitis    Diverticulosis    GERD (gastroesophageal reflux disease)    Glaucoma    Grave's disease    H. pylori infection    Hepatitis A    in her 19 from food   Hyperlipidemia    Hypothyroidism    Increased pressure in the eye    Insomnia    Morton's neuroma    Murmur    Scoliosis    Stroke (HCC) 12/2014   no deficits   Past Surgical History:  Procedure Laterality Date   AORTIC ARCH ANGIOGRAPHY N/A 01/01/2017   Procedure: AORTIC ARCH ANGIOGRAPHY;  Surgeon: Serene Gaile ORN, MD;  [facility];  Service: Cardiovascular;  Laterality: N/A;   APPENDECTOMY  1958   CATARACT EXTRACTION Left    CATARACT EXTRACTION  02/12/2018   CATARACT EXTRACTION W/PHACO Left 07/02/2016   Procedure: CATARACT EXTRACTION PHACO AND INTRAOCULAR LENS PLACEMENT (IOC)  Left;  Surgeon: Donzell Arlyce Budd, MD;  Location: [facility];  Service: Ophthalmology;  Laterality: Left;   CATARACT EXTRACTION W/PHACO Right 02/12/2018   Procedure: CATARACT EXTRACTION PHACO AND INTRAOCULAR LENS PLACEMENT (IOC)  RIGHT;  Surgeon: Mittie Gaskin, MD;  Location: [facility];  Service: Ophthalmology;  Laterality: Right;   CERVICAL LAMINECTOMY  1995   COLON SURGERY     CORONARY PRESSURE/FFR STUDY N/A 04/19/2017   Procedure: INTRAVASCULAR PRESSURE WIRE/FFR STUDY;  Surgeon: Mady Bruckner, MD;  Location: [facility];  Service: Cardiovascular;  Laterality: N/A;   EYE SURGERY     due to graves disease   EYE SURGERY Left    06/2023   FEMUR SURGERY Right    benign bone growth   FOOT NEUROMA SURGERY Right    LAPAROSCOPIC ASSISTED VAGINAL HYSTERECTOMY     LAPAROSCOPIC SIGMOID COLECTOMY  05/13/2006   Dr Lily   LEFT HEART CATH AND CORONARY ANGIOGRAPHY N/A 04/19/2017   Procedure: LEFT HEART CATH AND  CORONARY ANGIOGRAPHY;  Surgeon: Mady Bruckner, MD;  [facility];  Service: Cardiovascular;  Laterality: N/A;   LUMBAR DISC SURGERY  2009   PERIPHERAL VASCULAR INTERVENTION Left 01/01/2017   Procedure: PERIPHERAL VASCULAR INTERVENTION;  Surgeon: Serene Gaile ORN, MD;  [facility]: [facility];  Service: Cardiovascular;  Laterality: Left;   SUBCLAVIAN STENT PLACEMENT     TONSILLECTOMY  1992   TRIGGER FINGER RELEASE Right 05/26/2018   Procedure: RIGHT INDEX RELEASE TRIGGER FINGER/A-1 PULLEY;  Surgeon: Murrell Drivers, MD;  Location:  SURGERY CENTER;  Service: Orthopedics;  Laterality: Right;   TUBAL LIGATION     UPPER EXTREMITY ANGIOGRAPHY Left 01/01/2017   Procedure: Upper Extremity Angiography;  Surgeon: Serene Gaile ORN, MD;  Location: [facility] INVASIVE CV [facility];  Service: Cardiovascular;  Laterality: Left;   Family History  Problem Relation Age of Onset  Heart failure Mother 71   Heart murmur Mother    Other Father 83       SEPSIS   Hypertension Father    Stroke Father 64       CVA   Diabetes Sister    Heart failure Sister    Breast cancer Sister    Stroke Sister    Heart attack Sister 44   Hypertension Sister    Diabetes Brother    Hypertension Brother    Heart disease Brother    Cirrhosis Brother    Heart murmur Daughter    Breast cancer Maternal Aunt    Breast cancer Paternal Aunt    Breast cancer Cousin    Breast cancer Cousin    Breast cancer Cousin    Breast cancer Cousin    Stomach cancer Neg Hx    Colon cancer Neg Hx    Esophageal cancer Neg Hx    Pancreatic cancer Neg Hx    Social History   Socioeconomic History   Marital status: Married    Spouse name: Not on file   Number of children: 1   Years of education: Not on file   Highest education level: Not on file  Occupational History   Occupation: Pensions consultant    Comment: @ [employer] on [employer]  Tobacco Use   Smoking status: Former    Current packs/day: 0.00     Types: Cigarettes    Quit date: 03/12/1978    Years since quitting: 45.5    Passive exposure: Never   Smokeless tobacco: Never   Tobacco comments:    quit in 1978  Vaping Use   Vaping status: Never Used  Substance and Sexual Activity   Alcohol use: Yes    Alcohol/week: 7.0 standard drinks of alcohol    Types: 7 Glasses of wine per week    Comment: 1 glass of wine every evening   Drug use: No   Sexual activity: Not Currently    Birth control/protection: None  Other Topics Concern   Not on file  Social History Narrative   Not on file   Social Drivers of Health   Financial Resource Strain: Low Risk  (09/12/2023)   Overall Financial Resource Strain (CARDIA)    Difficulty of Paying Living Expenses: Not hard at all  Food Insecurity: No Food Insecurity (09/12/2023)   Hunger Vital Sign    Worried About Running Out of Food in the Last Year: Never true    Ran Out of Food in the Last Year: Never true  Transportation Needs: No Transportation Needs (09/12/2023)   PRAPARE - Administrator, Civil Service (Medical): No    Lack of Transportation (Non-Medical): No  Physical Activity: Insufficiently Active (09/12/2023)   Exercise Vital Sign    Days of Exercise per Week: 3 days    Minutes of Exercise per Session: 30 min  Stress: No Stress Concern Present (09/12/2023)   [employer] of Occupational Health - Occupational Stress Questionnaire    Feeling of Stress: Not at all  Social Connections: Moderately Isolated (09/12/2023)   Social Connection and Isolation Panel    Frequency of Communication with Friends and Family: More than three times a week    Frequency of Social Gatherings with Friends and Family: Three times a week    Attends Religious Services: More than 4 times per year    Active Member of Clubs or Organizations: No    Attends Banker Meetings: Never  Marital Status: Widowed    Tobacco Counseling Counseling given: Not Answered Tobacco comments: quit in  1978    Clinical Intake:  Pre-visit preparation completed: Yes  Pain : No/denies pain     Nutritional Risks: None Diabetes: No  Lab Results  Component Value Date   HGBA1C 6.0 (H) 06/25/2023   HGBA1C 5.9 01/01/2023   HGBA1C 6.7 09/20/2022     How often do you need to have someone help you when you read instructions, pamphlets, or other written materials from your doctor or pharmacy?: 1 - Never  Interpreter Needed?: No  Information entered by :: NAllen LPN   Activities of Daily Living     09/12/2023   10:25 AM  In your present state of health, do you have any difficulty performing the following activities:  Hearing? 0  Vision? 1  Comment glaucoma and macular degeneration  Difficulty concentrating or making decisions? 0  Walking or climbing stairs? 0  Dressing or bathing? 0  Doing errands, shopping? 0  Preparing Food and eating ? N  Using the Toilet? N  In the past six months, have you accidently leaked urine? Y  Do you have problems with loss of bowel control? N  Managing your Medications? N  Managing your Finances? N  Housekeeping or managing your Housekeeping? N    Patient Care Team: Chandra Toribio POUR, MD as PCP - General (Family Medicine) End, Lonni, MD as PCP - Cardiology (Cardiology) Gaspar Kung, MD as Consulting Physician (Orthopedic Surgery) Rosemarie Eather RAMAN, MD as Consulting Physician (Neurology) Teressa Toribio SQUIBB, MD (Inactive) as Attending Physician (Gastroenterology) Burnie Donzell Hollow, MD as Referring Physician (Ophthalmology) Tommas Pears, MD as Referring Physician (Endocrinology)  I have updated your Care Teams any recent Medical Services you may have received from other providers in the past year.     Assessment:   This is a routine wellness examination for Eilish.  Hearing/Vision screen Hearing Screening - Comments:: Denies hearing issues Vision Screening - Comments:: Regular eye exams, [facility]   Goals Addressed              This Visit's Progress    Patient Stated       09/12/2023, wants to stay healthy, exercise brain       Depression Screen     09/12/2023   10:39 AM 07/03/2023   10:17 AM 09/06/2022    9:08 AM 07/02/2022    1:50 PM 05/14/2022    8:39 AM 12/25/2021    1:54 PM 02/28/2021   11:00 AM  PHQ 2/9 Scores  PHQ - 2 Score 0 0 0 0 0 0 0  PHQ- 9 Score 1 1  0  2 2    Fall Risk     09/12/2023   10:37 AM 09/06/2022    9:09 AM 07/02/2022    1:49 PM 02/28/2021   10:59 AM 10/19/2020    8:56 AM  Fall Risk   Falls in the past year? 1 0 0 0 0  Number falls in past yr: 0 0 0 0 0  Injury with Fall? 0 0 0 0 0  Risk for fall due to : Medication side effect No Fall Risks No Fall Risks No Fall Risks No Fall Risks  Follow up Falls prevention discussed;Falls evaluation completed Falls prevention discussed Falls evaluation completed Falls evaluation completed  Falls evaluation completed      Data saved with a previous flowsheet row definition    MEDICARE RISK AT HOME:  Medicare Risk at  Home Any stairs in or around the home?: Yes If so, are there any without handrails?: No Home free of loose throw rugs in walkways, pet beds, electrical cords, etc?: Yes Adequate lighting in your home to reduce risk of falls?: Yes Life alert?: No Use of a cane, walker or w/c?: No Grab bars in the bathroom?: Yes Shower chair or bench in shower?: Yes Elevated toilet seat or a handicapped toilet?: Yes  TIMED UP AND GO:  Was the test performed?  No  Cognitive Function: 6CIT completed        09/12/2023   10:40 AM 09/06/2022    9:10 AM 12/25/2021    1:57 PM 10/19/2020    8:57 AM 10/19/2019   11:06 AM  6CIT Screen  What Year? 0 points 0 points 0 points 0 points 0 points  What month? 0 points 0 points 0 points 0 points 0 points  What time? 0 points 0 points 0 points 0 points 0 points  Count back from 20 0 points 0 points 0 points 0 points 0 points  Months in reverse 0 points 0 points 2 points 0 points 0 points   Repeat phrase 0 points 0 points 0 points 0 points 4 points  Total Score 0 points 0 points 2 points 0 points 4 points    Immunizations Immunization History  Administered Date(s) Administered   Fluad Quad(high Dose 65+) 12/14/2022   Influenza, High Dose Seasonal PF 02/24/2015, 12/10/2021   Influenza-Unspecified 01/09/2016, 12/12/2016, 12/11/2018, 01/01/2022   PFIZER(Purple Top)SARS-COV-2 Vaccination 04/08/2019, 04/29/2019   Pneumococcal Conjugate-13 12/12/2016   Pneumococcal Polysaccharide-23 09/18/2018   Unspecified SARS-COV-2 Vaccination 06/02/2022   Zoster, Unspecified 03/12/2012    Screening Tests Health Maintenance  Topic Date Due   DTaP/Tdap/Td (1 - Tdap) Never done   Zoster Vaccines- Shingrix (1 of 2) 11/26/1964   COVID-19 Vaccine (4 - 2024-25 season) 11/11/2022   INFLUENZA VACCINE  10/11/2023   Medicare Annual Wellness (AWV)  09/11/2024   Pneumococcal Vaccine: 50+ Years  Completed   DEXA SCAN  Completed   Hepatitis C Screening  Completed   Hepatitis B Vaccines  Aged Out   HPV VACCINES  Aged Out   Meningococcal B Vaccine  Aged Out   Colonoscopy  Discontinued    Health Maintenance  Health Maintenance Due  Topic Date Due   DTaP/Tdap/Td (1 - Tdap) Never done   Zoster Vaccines- Shingrix (1 of 2) 11/26/1964   COVID-19 Vaccine (4 - 2024-25 season) 11/11/2022   Health Maintenance Items Addressed: States had TDAP in 12/2021. Due for shingles vaccine. Declines covid vaccines. Requested DEXA report.  Additional Screening:  Vision Screening: Recommended annual ophthalmology exams for early detection of glaucoma and other disorders of the eye. Would you like a referral to an eye doctor? No    Dental Screening: Recommended annual dental exams for proper oral hygiene  Community Resource Referral / Chronic Care Management: CRR required this visit?  No   CCM required this visit?  No   Plan:    I have personally reviewed and noted the following in the patient's  chart:   Medical and social history Use of alcohol, tobacco or illicit drugs  Current medications and supplements including opioid prescriptions. Patient is not currently taking opioid prescriptions. Functional ability and status Nutritional status Physical activity Advanced directives List of other physicians Hospitalizations, surgeries, and ER visits in previous 12 months Vitals Screenings to include cognitive, depression, and falls Referrals and appointments  In addition, I have reviewed and discussed  with patient certain preventive protocols, quality metrics, and best practice recommendations. A written personalized care plan for preventive services as well as general preventive health recommendations were provided to patient.   Ardella FORBES Dawn, LPN   04/17/7972   After Visit Summary: (MyChart) Due to this being a telephonic visit, the after visit summary with patients personalized plan was offered to patient via MyChart   Notes: Nothing significant to report at this time.

## 2023-09-12 NOTE — Patient Instructions (Signed)
 Ms. Jansma , Thank you for taking time out of your busy schedule to complete your Annual Wellness Visit with me. I enjoyed our conversation and look forward to speaking with you again next year. I, as well as your care team,  appreciate your ongoing commitment to your health goals. Please review the following plan we discussed and let me know if I can assist you in the future. Your Game plan/ To Do List    Referrals: If you haven't heard from the office you've been referred to, please reach out to them at the phone provided.  N/a Follow up Visits: Next Medicare AWV with our clinical staff: 10/29/2024 at 8:50   Have you seen your provider in the last 6 months (3 months if uncontrolled diabetes)? Yes Next Office Visit with your provider: 07/06/2024 at 10:10  Clinician Recommendations:  Aim for 30 minutes of exercise or brisk walking, 6-8 glasses of water, and 5 servings of fruits and vegetables each day.       This is a list of the screening recommended for you and due dates:  Health Maintenance  Topic Date Due   DTaP/Tdap/Td vaccine (1 - Tdap) Never done   Zoster (Shingles) Vaccine (1 of 2) 11/26/1963   COVID-19 Vaccine (4 - 2024-25 season) 11/11/2022   Flu Shot  10/11/2023   Medicare Annual Wellness Visit  09/11/2024   Pneumococcal Vaccine for age over 73  Completed   DEXA scan (bone density measurement)  Completed   Hepatitis C Screening  Completed   Hepatitis B Vaccine  Aged Out   HPV Vaccine  Aged Out   Meningitis B Vaccine  Aged Out   Colon Cancer Screening  Discontinued    Advanced directives: (Copy Requested) Please bring a copy of your health care power of attorney and living will to the office to be added to your chart at your convenience. You can mail to [facility] [address] [place], [place] [address] or email to [email or web address] Advance Care Planning is important because it:  [x]  Makes sure you receive the medical care that is consistent with  your values, goals, and preferences  [x]  It provides guidance to your family and loved ones and reduces their decisional burden about whether or not they are making the right decisions based on your wishes.  Follow the link provided in your after visit summary or read over the paperwork we have mailed to you to help you started getting your Advance Directives in place. If you need assistance in completing these, please reach out to us  so that we can help you!  See attachments for Preventive Care and Fall Prevention Tips.

## 2023-09-19 ENCOUNTER — Ambulatory Visit: Admitting: Physician Assistant

## 2023-09-23 ENCOUNTER — Ambulatory Visit: Attending: Physician Assistant | Admitting: Physician Assistant

## 2023-09-23 ENCOUNTER — Ambulatory Visit

## 2023-09-23 ENCOUNTER — Encounter: Payer: Self-pay | Admitting: Physician Assistant

## 2023-09-23 VITALS — BP 120/60 | HR 72 | Ht 60.0 in | Wt 115.8 lb

## 2023-09-23 DIAGNOSIS — I2510 Atherosclerotic heart disease of native coronary artery without angina pectoris: Secondary | ICD-10-CM | POA: Diagnosis not present

## 2023-09-23 DIAGNOSIS — R5383 Other fatigue: Secondary | ICD-10-CM

## 2023-09-23 DIAGNOSIS — E785 Hyperlipidemia, unspecified: Secondary | ICD-10-CM | POA: Diagnosis not present

## 2023-09-23 DIAGNOSIS — Z79899 Other long term (current) drug therapy: Secondary | ICD-10-CM | POA: Diagnosis not present

## 2023-09-23 DIAGNOSIS — I9589 Other hypotension: Secondary | ICD-10-CM | POA: Diagnosis not present

## 2023-09-23 DIAGNOSIS — I739 Peripheral vascular disease, unspecified: Secondary | ICD-10-CM

## 2023-09-23 NOTE — Patient Instructions (Signed)
 Medication Instructions:  None ordered at this time  *If you need a refill on your cardiac medications before your next appointment, please call your pharmacy*  Lab Work: Your provider would like for you to have following labs drawn today CMeT and CBC.   If you have labs (blood work) drawn today and your tests are completely normal, you will receive your results only by: MyChart Message (if you have MyChart) OR A paper copy in the mail If you have any lab test that is abnormal or we need to change your treatment, we will call you to review the results.  Testing/Procedures: Your physician has requested that you have an echocardiogram. Echocardiography is a painless test that uses sound waves to create images of your heart. It provides your doctor with information about the size and shape of your heart and how well your heart's chambers and valves are working.   You may receive an ultrasound enhancing agent through an IV if needed to better visualize your heart during the echo. This procedure takes approximately one hour.  There are no restrictions for this procedure.  This will take place at [address] [facility] [address] [facility], [place] [address]  Please note: We ask at that you not bring children with you during ultrasound (echo/ vascular) testing. Due to room size and safety concerns, children are not allowed in the ultrasound rooms during exams. Our front office staff cannot provide observation of children in our lobby area while testing is being conducted. An adult accompanying a patient to their appointment will only be allowed in the ultrasound room at the discretion of the ultrasound technician under special circumstances. We apologize for any inconvenience.   Your physician has recommended that you wear a Zio monitor.   This monitor is a medical device that records the heart's electrical activity. Doctors most often use these monitors to diagnose arrhythmias. Arrhythmias  are problems with the speed or rhythm of the heartbeat. The monitor is a small device applied to your chest. You can wear one while you do your normal daily activities. While wearing this monitor if you have any symptoms to push the button and record what you felt. Once you have worn this monitor for the period of time provider prescribed (Usually 14 days), you will return the monitor device in the postage paid box. Once it is returned they will download the data collected and provide us  with a report which the provider will then review and we will call you with those results. Important tips:  Avoid showering during the first 24 hours of wearing the monitor. Avoid excessive sweating to help maximize wear time. Do not submerge the device, no hot tubs, and no swimming pools. Keep any lotions or oils away from the patch. After 24 hours you may shower with the patch on. Take brief showers with your back facing the shower head.  Do not remove patch once it has been placed because that will interrupt data and decrease adhesive wear time. Push the button when you have any symptoms and write down what you were feeling. Once you have completed wearing your monitor, remove and place into box which has postage paid and place in your outgoing mailbox.  If for some reason you have misplaced your box then call our office and we can provide another box and/or mail it off for you.  Follow-Up: At [facility], you and your health needs are our priority.  As part of our continuing mission  to provide you with exceptional heart care, our providers are all part of one team.  This team includes your primary Cardiologist (physician) and Advanced Practice Providers or APPs (Physician Assistants and Nurse Practitioners) who all work together to provide you with the care you need, when you need it.  Your next appointment:   2 month(s)  Provider:   You may see Lonni Hanson, MD or Bernardino Bring, PA-C

## 2023-09-23 NOTE — Progress Notes (Signed)
 Cardiology Office Note    Date:  09/23/2023   ID:  [name], DOB 1945-05-25, MRN [id number]  PCP:  Chandra Toribio POUR, MD  Cardiologist:  Lonni Hanson, MD  Electrophysiologist:  None   Chief Complaint: Fatigue and hypertension  History of Present Illness:   [name] is a 78 y.o. female with history of nonobstructive CAD, PVD with right vertebral artery occlusion and left subclavian artery stenosis status post stenting in 2018 followed by vascular surgery, CVA, HLD, hypothyroidism, and GERD who presents for evaluation of fatigue and hypotension.  Remote nuclear stress test in 2012 with preserved LV systolic function and no evidence of perfusion defect.  Echo in 12/2014 showed an EF of 60 to 65%, no regional wall motion abnormalities, grade 1 diastolic dysfunction, trivial mitral regurgitation, normal RV systolic function, and normal RVSP.  Treadmill MPI in 11/2016 was overall low risk with an EF of 76%.  Cardiac monitoring in 2018 showed a predominant rhythm of sinus with no significant arrhythmia or prolonged pauses.  Patient triggered events corresponded to sinus rhythm.  LHC in 04/2017 showed mild to moderate, nonobstructive CAD predominantly affecting the LAD.  Sequential 50% proximal and 20 to 30% mid LAD stenoses that were not hemodynamically significant with an FFR of 0.92.  Coronary CTA in 12/2019 showed a calcium  score of 302 which was the 79th percentile with 25 to 49% of proximal and mid LAD stenosis and 0 to 24% stenosis in the ramus and LCx.  She was seen in 04/2022 at which time she reported lightheadedness and feeling off balance over the preceding month without focal neurological deficits.  Orthostatics were negative.  Labs were unrevealing.  She was encouraged to follow-up with vascular surgery for ongoing management of cerebrovascular disease.  She was last seen in the office in 12/2022 and reported having lost her husband as well as a severe reaction to a cortisone  injection for bursitis in the left hip that led to a significant immune response, weight loss, and prolonged recovery period of approximately 4 months.  She also reported changes to her vision that she attributed to known glaucoma and macular degeneration.  She was without symptoms of angina or cardiac decompensation.  She comes in today reporting an episode of profound fatigue while driving several weeks ago without associated chest pain, dizziness, presyncope, palpitations, or significant dyspnea.  Upon arriving home she remained fatigued and checked her blood pressure which was in the 90s over 50s.  Fatigue slowly improved.  Over the subsequent week her blood pressure slowly trended upwards and has been back to her baseline since.  Leading up to her episode of fatigue, there were no changes to her daily routine.  She was well-hydrated and had not missed any meals.  She mentioned this episode to her endocrinologist who recommended she follow-up with cardiology.  Patient also reports a sharp shocklike sensation along the lower anterior aspect of her midline neck that will last a few seconds in duration and spontaneously resolve.  No significant lower extremity swelling or progressive orthopnea.   Labs independently reviewed: 06/2023 - Hgb 15.4, PLT 251, BUN 11, serum creatinine 0.7, potassium 4.8, albumin 5, AST normal, ALT 33, TC 159, TG 67, HDL 75, LDL 71, A1c 6.0, TSH normal  Past Medical History:  Diagnosis Date   Anxiety    Barrett esophagus 2003   Cerebral infarction (HCC) 12/26/2014   Cerebral infarction due to embolism of right vertebral artery (HCC)    Cerebrovascular  accident (CVA) (HCC) 12/26/2014   Diverticulitis    Diverticulosis    GERD (gastroesophageal reflux disease)    Glaucoma    Grave's disease    H. pylori infection    Hepatitis A    in her 19 from food   Hyperlipidemia    Hypothyroidism    Increased pressure in the eye    Insomnia    Morton's neuroma    Murmur     Scoliosis    Stroke (HCC) 12/2014   no deficits    Past Surgical History:  Procedure Laterality Date   AORTIC ARCH ANGIOGRAPHY N/A 01/01/2017   Procedure: AORTIC ARCH ANGIOGRAPHY;  Surgeon: Serene Gaile ORN, MD;  Location: [facility];  Service: Cardiovascular;  Laterality: N/A;   APPENDECTOMY  1958   CATARACT EXTRACTION Left    CATARACT EXTRACTION  02/12/2018   CATARACT EXTRACTION W/PHACO Left 07/02/2016   Procedure: CATARACT EXTRACTION PHACO AND INTRAOCULAR LENS PLACEMENT (IOC)  Left;  Surgeon: Donzell Arlyce Budd, MD;  Location: [facility];  Service: Ophthalmology;  Laterality: Left;   CATARACT EXTRACTION W/PHACO Right 02/12/2018   Procedure: CATARACT EXTRACTION PHACO AND INTRAOCULAR LENS PLACEMENT (IOC)  RIGHT;  Surgeon: Mittie Gaskin, MD;  Location: [facility];  Service: Ophthalmology;  Laterality: Right;   CERVICAL LAMINECTOMY  1995   COLON SURGERY     CORONARY PRESSURE/FFR STUDY N/A 04/19/2017   Procedure: INTRAVASCULAR PRESSURE WIRE/FFR STUDY;  Surgeon: Mady Bruckner, MD;  Location: [facility];  Service: Cardiovascular;  Laterality: N/A;   EYE SURGERY     due to graves disease   EYE SURGERY Left    06/2023   FEMUR SURGERY Right    benign bone growth   FOOT NEUROMA SURGERY Right    LAPAROSCOPIC ASSISTED VAGINAL HYSTERECTOMY     LAPAROSCOPIC SIGMOID COLECTOMY  05/13/2006   Dr Lily   LEFT HEART CATH AND CORONARY ANGIOGRAPHY N/A 04/19/2017   Procedure: LEFT HEART CATH AND CORONARY ANGIOGRAPHY;  Surgeon: Mady Bruckner, MD;  Location: [facility];  Service: Cardiovascular;  Laterality: N/A;   LUMBAR DISC SURGERY  2009   PERIPHERAL VASCULAR INTERVENTION Left 01/01/2017   Procedure: PERIPHERAL VASCULAR INTERVENTION;  Surgeon: Serene Gaile ORN, MD;  Location: [facility];  Service: Cardiovascular;  Laterality: Left;   SUBCLAVIAN STENT PLACEMENT     TONSILLECTOMY  1992   TRIGGER FINGER RELEASE Right 05/26/2018    Procedure: RIGHT INDEX RELEASE TRIGGER FINGER/A-1 PULLEY;  Surgeon: Murrell Drivers, MD;  Location: [facility];  Service: Orthopedics;  Laterality: Right;   TUBAL LIGATION     UPPER EXTREMITY ANGIOGRAPHY Left 01/01/2017   Procedure: Upper Extremity Angiography;  Surgeon: Serene Gaile ORN, MD;  Location: [facility];  Service: Cardiovascular;  Laterality: Left;    Current Medications: Current Meds  Medication Sig   acetaminophen  (TYLENOL ) 500 MG tablet Take 500 mg by mouth daily as needed for moderate pain or headache.    Calcium  Citrate-Vitamin D  (CALCIUM  CITRATE + PO) Take by mouth daily at 12 noon.   clopidogrel  (PLAVIX ) 75 MG tablet TAKE 1 TABLET BY MOUTH EVERY DAY   diazepam  (VALIUM ) 5 MG tablet Take 1 tablet (5 mg total) by mouth at bedtime as needed for anxiety (sleep).   dorzolamide -timolol  (COSOPT) 22.3-6.8 MG/ML ophthalmic solution Place into both eyes daily.   isosorbide  mononitrate (IMDUR ) 30 MG 24 hr tablet TAKE 1/2 OF A TABLET (15 MG TOTAL) BY MOUTH DAILY   latanoprost  (XALATAN ) 0.005 %  ophthalmic solution 1 drop at bedtime.   levothyroxine (SYNTHROID) 75 MCG tablet Take 75 mcg by mouth every morning.   liothyronine (CYTOMEL) 5 MCG tablet Take 5 mcg by mouth. Takes 4 times weekly.   Lutein 6 MG TABS Take 6 mg by mouth daily. Takes four times a week   Multiple Vitamin (MULTIVITAMIN) tablet Take 1 tablet by mouth daily.   rosuvastatin  (CRESTOR ) 40 MG tablet Take 1 tablet (40 mg total) by mouth daily.    Allergies:   Atorvastatin , Neuromuscular blocking agents, and Morphine  and codeine   Social History   Socioeconomic History   Marital status: Married    Spouse name: Not on file   Number of children: 1   Years of education: Not on file   Highest education level: Not on file  Occupational History   Occupation: Pensions consultant    Comment: @ [employer] on [employer]  Tobacco Use   Smoking status: Former    Current packs/day: 0.00    Types:  Cigarettes    Quit date: 03/12/1978    Years since quitting: 45.5    Passive exposure: Never   Smokeless tobacco: Never   Tobacco comments:    quit in 1978  Vaping Use   Vaping status: Never Used  Substance and Sexual Activity   Alcohol use: Yes    Alcohol/week: 7.0 standard drinks of alcohol    Types: 7 Glasses of wine per week    Comment: 1 glass of wine every evening   Drug use: No   Sexual activity: Not Currently    Birth control/protection: None  Other Topics Concern   Not on file  Social History Narrative   Not on file   Social Drivers of Health   Financial Resource Strain: Low Risk  (09/12/2023)   Overall Financial Resource Strain (CARDIA)    Difficulty of Paying Living Expenses: Not hard at all  Food Insecurity: No Food Insecurity (09/12/2023)   Hunger Vital Sign    Worried About Running Out of Food in the Last Year: Never true    Ran Out of Food in the Last Year: Never true  Transportation Needs: No Transportation Needs (09/12/2023)   PRAPARE - Administrator, Civil Service (Medical): No    Lack of Transportation (Non-Medical): No  Physical Activity: Insufficiently Active (09/12/2023)   Exercise Vital Sign    Days of Exercise per Week: 3 days    Minutes of Exercise per Session: 30 min  Stress: No Stress Concern Present (09/12/2023)   [employer] of Occupational Health - Occupational Stress Questionnaire    Feeling of Stress: Not at all  Social Connections: Moderately Isolated (09/12/2023)   Social Connection and Isolation Panel    Frequency of Communication with Friends and Family: More than three times a week    Frequency of Social Gatherings with Friends and Family: Three times a week    Attends Religious Services: More than 4 times per year    Active Member of Clubs or Organizations: No    Attends Banker Meetings: Never    Marital Status: Widowed     Family History:  The patient's family history includes Breast cancer in her cousin,  cousin, cousin, cousin, maternal aunt, paternal aunt, and sister; Cirrhosis in her brother; Diabetes in her brother and sister; Heart attack (age of onset: 39) in her sister; Heart disease in her brother; Heart failure in her sister; Heart failure (age of onset: 68) in her mother;  Heart murmur in her daughter and mother; Hypertension in her brother, father, and sister; Other (age of onset: 49) in her father; Stroke in her sister; Stroke (age of onset: 57) in her father. There is no history of Stomach cancer, Colon cancer, Esophageal cancer, or Pancreatic cancer.  ROS:   12-point review of systems is negative unless otherwise noted in the HPI.   EKGs/Labs/Other Studies Reviewed:    Studies reviewed were summarized above. The additional studies were reviewed today:  Coronary CTA 12/17/2019: Aorta: Normal size. Minimal descending aortic wall calcifications. No dissection.   Aortic Valve:  Trileaflet.  No calcifications.   Coronary Arteries:  Normal coronary origin.  Right dominance.   RCA is a large dominant artery that gives rise to a PDA and PLA. There is no plaque.   Left main is a large artery that gives rise to a Ramus, LAD and LCX arteries. There is mild calcifications in the mid Ramus causing minimal stenosis (0-24%)   LAD is a large vessel that has calcified plaque in the proximal and mid segments causing mild stenosis (25-49%).   LCX is a non-dominant artery that gives rise to a small OM1 branch and a larger OM2 branch. There is mild calcified plaque in the mid LCx causing minimal stenosis (0-24%)   Other findings:   Normal pulmonary vein drainage into the left atrium.   Normal left atrial appendage without a thrombus.   Normal size of the pulmonary artery.   IMPRESSION: 1. High coronary calcium  score of 302. This was 79th percentile for age and sex matched control. 2. Normal coronary origin with right dominance. 3. Calcified plaque in the proximal and mid LAD causing  mild stenosis (25-49%). 4. Calcified plaque causing minimal stenosis (0-24%) in the Ramus and LCx 5. CAD-RADS 2. Mild non-obstructive CAD (25-49%). Consider non-atherosclerotic causes of chest pain. Consider preventive therapy and risk factor modification. __________  LHC 04/19/2017: Conclusions: Mild to moderate, nonobstructive coronary artery disease predominantly affecting the LAD.  Sequential 50% proximal and 20-30% mid LAD stenoses are not hemodynamically significant (FFR 0.92). Normal left ventricular contraction with mildly elevated filling pressure.   Recommendations: Medical therapy including continuation of long-acting nitrate for possible component of microvascular disc function. Risk factor modification and medical therapy to prevent progression of coronary and peripheral vascular disease. __________  Zio patch 11/2016: The patient was monitored for 30 days. Predominant rhythm was sinus with an average rate of 90 bpm. No significant arrhythmia or prolonged pause was identified. Patient triggered event corresponds to sinus rhythm.   Sinus rhythm. No significant arrhythmia. __________  Treadmill MPI 11/22/2016: Nuclear stress EF: 76%. The left ventricular ejection fraction is hyperdynamic (>65%). Normal wall motion . Defect 1: There is a small defect of mild severity present in the apical anterior and apex location. The defect is not reversible and is most likely due to apical thinning. The study is normal. This is a low risk study. __________  2D echo 12/28/2014: - Left ventricle: The cavity size was normal. Systolic function was    normal. The estimated ejection fraction was in the range of 60%    to 65%. Wall motion was normal; there were no regional wall    motion abnormalities. Doppler parameters are consistent with    abnormal left ventricular relaxation (grade 1 diastolic    dysfunction). There was no evidence of elevated ventricular    filling pressure by  Doppler parameters.  - Aortic valve: Trileaflet; normal thickness leaflets. There was no  regurgitation.  - Aortic root: The aortic root was normal in size.  - Mitral valve: Mildly thickened leaflets . There was trivial    regurgitation.  - Left atrium: The atrium was normal in size.  - Right ventricle: Systolic function was normal.  - Right atrium: The atrium was normal in size.  - Tricuspid valve: There was trivial regurgitation.  - Pulmonic valve: There was no regurgitation.  - Pulmonary arteries: Systolic pressure was within the normal    range.  - Inferior vena cava: The vessel was normal in size.  - Pericardium, extracardiac: There was no pericardial effusion.  __________  See CV Studies in Epic for more remote imaging   EKG:  EKG is ordered today.  The EKG ordered today demonstrates NSR, 72 bpm, no acute ST-T changes  Recent Labs: 06/25/2023: ALT 33; BUN 11; Creatinine, Ser 0.70; Hemoglobin 15.4; Platelets 251; Potassium 4.8; Sodium 137; TSH 0.557  Recent Lipid Panel    Component Value Date/Time   CHOL 159 06/25/2023 0923   TRIG 67 06/25/2023 0923   HDL 75 06/25/2023 0923   CHOLHDL 2.1 06/25/2023 0923   CHOLHDL 2.5 04/12/2022 1429   VLDL 21 04/12/2022 1429   LDLCALC 71 06/25/2023 0923    PHYSICAL EXAM:    VS:  BP 120/60 (BP Location: Left Arm, Patient Position: Sitting, Cuff Size: Normal)   Pulse 72   Ht 5' (1.524 m)   Wt 115 lb 12.8 oz (52.5 kg)   SpO2 98%   BMI 22.62 kg/m   BMI: Body mass index is 22.62 kg/m.  Physical Exam Vitals reviewed.  Constitutional:      Appearance: She is well-developed.  HENT:     Head: Normocephalic and atraumatic.  Eyes:     General:        Right eye: No discharge.        Left eye: No discharge.  Cardiovascular:     Rate and Rhythm: Normal rate and regular rhythm.     Pulses:          Posterior tibial pulses are 2+ on the right side and 2+ on the left side.     Heart sounds: Normal heart sounds, S1 normal and S2  normal. Heart sounds not distant. No midsystolic click and no opening snap. No murmur heard.    No friction rub.  Pulmonary:     Effort: Pulmonary effort is normal. No respiratory distress.     Breath sounds: Normal breath sounds. No decreased breath sounds, wheezing, rhonchi or rales.  Chest:     Chest wall: No tenderness.  Musculoskeletal:     Cervical back: Normal range of motion.     Right lower leg: No edema.     Left lower leg: No edema.  Skin:    General: Skin is warm and dry.     Nails: There is no clubbing.  Neurological:     Mental Status: She is alert and oriented to person, place, and time.  Psychiatric:        Speech: Speech normal.        Behavior: Behavior normal.        Thought Content: Thought content normal.        Judgment: Judgment normal.     Wt Readings from Last 3 Encounters:  09/23/23 115 lb 12.8 oz (52.5 kg)  07/03/23 115 lb 1.9 oz (52.2 kg)  04/29/23 116 lb 3.2 oz (52.7 kg)     ASSESSMENT & PLAN:   Fatigue/hypotension:  Episode of fatigue and hypotension several weeks ago with BP in the 90s over 50s.  Unclear precipitating event.  BP subsequently improved back to baseline over the span of approximately 1 week.  She has been without symptoms of angina or cardiac decompensation.  Obtain echo to evaluate for new cardiomyopathy and placed Zio patch to evaluate for significant arrhythmia, high-grade AV block, or prolonged pauses.  Check CBC and CMP.  Continue with adequate hydration.  Hemodynamically stable in the office today.  Nonobstructive CAD: She is without symptoms of angina or cardiac decompensation.  She remains on clopidogrel  under the direction of vascular surgery given underlying cerebrovascular disease.  She otherwise remains on Imdur  15 mg and rosuvastatin  40 mg.  PAD: Recent imaging in 04/2023 showed stable anatomy.  She remains on clopidogrel  and rosuvastatin .  Follow-up with vascular surgery as directed.  HLD: LDL 71 in 06/2023.  She remains on  rosuvastatin  40 mg.      Disposition: F/u with Dr. Mady or an APP in 2 months.   Medication Adjustments/Labs and Tests Ordered: Current medicines are reviewed at length with the patient today.  Concerns regarding medicines are outlined above. Medication changes, Labs and Tests ordered today are summarized above and listed in the Patient Instructions accessible in Encounters.   Signed, Bernardino Bring, PA-C 09/23/2023 12:37 PM     [facility] [place] [address] [place], [place] [address] [phone]

## 2023-09-24 ENCOUNTER — Ambulatory Visit: Payer: Self-pay | Admitting: Physician Assistant

## 2023-09-24 ENCOUNTER — Other Ambulatory Visit
Admission: RE | Admit: 2023-09-24 | Discharge: 2023-09-24 | Disposition: A | Discharge status: Home / Self Care | Source: Ambulatory Visit | Attending: Physician Assistant | Admitting: Physician Assistant

## 2023-09-24 DIAGNOSIS — Z79899 Other long term (current) drug therapy: Secondary | ICD-10-CM | POA: Insufficient documentation

## 2023-09-24 DIAGNOSIS — I9589 Other hypotension: Secondary | ICD-10-CM | POA: Insufficient documentation

## 2023-09-24 DIAGNOSIS — R5383 Other fatigue: Secondary | ICD-10-CM | POA: Diagnosis not present

## 2023-09-24 LAB — CBC
Hematocrit: 46.8 % — ABNORMAL HIGH (ref 34.0–46.6)
Hemoglobin: 14.9 g/dL (ref 11.1–15.9)
MCH: 30.6 pg (ref 26.6–33.0)
MCHC: 31.8 g/dL (ref 31.5–35.7)
MCV: 96 fL (ref 79–97)
Platelets: 217 x10E3/uL (ref 150–450)
RBC: 4.87 x10E6/uL (ref 3.77–5.28)
RDW: 12.1 % (ref 11.7–15.4)
WBC: 5.3 x10E3/uL (ref 3.4–10.8)

## 2023-09-24 LAB — COMPREHENSIVE METABOLIC PANEL WITH GFR
ALT: 25 IU/L (ref 0–32)
AST: 34 IU/L (ref 0–40)
Albumin: 4.5 g/dL (ref 3.8–4.8)
Alkaline Phosphatase: 97 IU/L (ref 44–121)
BUN/Creatinine Ratio: 17 (ref 12–28)
BUN: 12 mg/dL (ref 8–27)
Bilirubin Total: 0.5 mg/dL (ref 0.0–1.2)
CO2: 20 mmol/L (ref 20–29)
Calcium: 9.8 mg/dL (ref 8.7–10.3)
Chloride: 103 mmol/L (ref 96–106)
Creatinine, Ser: 0.72 mg/dL (ref 0.57–1.00)
Globulin, Total: 2.4 g/dL (ref 1.5–4.5)
Glucose: 90 mg/dL (ref 70–99)
Potassium: 5.5 mmol/L — ABNORMAL HIGH (ref 3.5–5.2)
Sodium: 140 mmol/L (ref 134–144)
Total Protein: 6.9 g/dL (ref 6.0–8.5)
eGFR: 86 mL/min/1.73 (ref >59)

## 2023-09-24 LAB — POTASSIUM: Potassium: 4.9 mmol/L (ref 3.5–5.1)

## 2023-09-26 DIAGNOSIS — H401133 Primary open-angle glaucoma, bilateral, severe stage: Secondary | ICD-10-CM | POA: Diagnosis not present

## 2023-09-26 DIAGNOSIS — H353131 Nonexudative age-related macular degeneration, bilateral, early dry stage: Secondary | ICD-10-CM | POA: Diagnosis not present

## 2023-09-30 ENCOUNTER — Ambulatory Visit: Attending: Physician Assistant

## 2023-09-30 DIAGNOSIS — R5383 Other fatigue: Secondary | ICD-10-CM

## 2023-09-30 DIAGNOSIS — I9589 Other hypotension: Secondary | ICD-10-CM

## 2023-09-30 LAB — ECHOCARDIOGRAM COMPLETE
AR max vel: 2.5 cm2
AV Area VTI: 2.38 cm2
AV Area mean vel: 2.35 cm2
AV Mean grad: 2 mmHg
AV Peak grad: 4.7 mmHg
Ao pk vel: 1.08 m/s
Area-P 1/2: 3.72 cm2
S' Lateral: 2.92 cm

## 2023-10-25 DIAGNOSIS — I9589 Other hypotension: Secondary | ICD-10-CM | POA: Diagnosis not present

## 2023-10-25 DIAGNOSIS — R5383 Other fatigue: Secondary | ICD-10-CM | POA: Diagnosis not present

## 2023-10-25 DIAGNOSIS — I4719 Other supraventricular tachycardia: Secondary | ICD-10-CM | POA: Diagnosis not present

## 2023-10-28 ENCOUNTER — Other Ambulatory Visit: Payer: Self-pay | Admitting: Internal Medicine

## 2023-10-29 DIAGNOSIS — R5383 Other fatigue: Secondary | ICD-10-CM

## 2023-10-29 DIAGNOSIS — I9589 Other hypotension: Secondary | ICD-10-CM | POA: Diagnosis not present

## 2023-11-05 DIAGNOSIS — E890 Postprocedural hypothyroidism: Secondary | ICD-10-CM | POA: Diagnosis not present

## 2023-11-13 ENCOUNTER — Encounter: Payer: Self-pay | Admitting: Physician Assistant

## 2023-11-13 ENCOUNTER — Ambulatory Visit: Attending: Physician Assistant | Admitting: Physician Assistant

## 2023-11-13 VITALS — BP 144/81 | HR 60 | Resp 19 | Ht 61.0 in | Wt 112.5 lb

## 2023-11-13 DIAGNOSIS — E785 Hyperlipidemia, unspecified: Secondary | ICD-10-CM

## 2023-11-13 DIAGNOSIS — I471 Supraventricular tachycardia, unspecified: Secondary | ICD-10-CM | POA: Diagnosis not present

## 2023-11-13 DIAGNOSIS — I2510 Atherosclerotic heart disease of native coronary artery without angina pectoris: Secondary | ICD-10-CM | POA: Diagnosis not present

## 2023-11-13 DIAGNOSIS — R5383 Other fatigue: Secondary | ICD-10-CM

## 2023-11-13 DIAGNOSIS — I739 Peripheral vascular disease, unspecified: Secondary | ICD-10-CM

## 2023-11-13 NOTE — Patient Instructions (Signed)
 Medication Instructions:  Your physician recommends that you continue on your current medications as directed. Please refer to the Current Medication list given to you today.   *If you need a refill on your cardiac medications before your next appointment, please call your pharmacy*  Lab Work: None ordered at this time   Follow-Up: At [facility], you and your health needs are our priority.  As part of our continuing mission to provide you with exceptional heart care, our providers are all part of one team.  This team includes your primary Cardiologist (physician) and Advanced Practice Providers or APPs (Physician Assistants and Nurse Practitioners) who all work together to provide you with the care you need, when you need it.  Your next appointment:   6 month(s)  Provider:   You may see Lonni Hanson, MD or Bernardino Bring, PA-C  We recommend signing up for the patient portal called MyChart.  Sign up information is provided on this After Visit Summary.  MyChart is used to connect with patients for Virtual Visits (Telemedicine).  Patients are able to view lab/test results, encounter notes, upcoming appointments, etc.  Non-urgent messages can be sent to your provider as well.   To learn more about what you can do with MyChart, go to [email or web address].

## 2023-11-13 NOTE — Progress Notes (Signed)
 Cardiology Office Note    Date:  11/13/2023   ID:  [name], DOB March 11, 1946, MRN [id number]  PCP:  Chandra Toribio POUR, MD  Cardiologist:  Lonni Hanson, MD  Electrophysiologist:  None   Chief Complaint: Follow up  History of Present Illness:   [name] is a 78 y.o. female with history of nonobstructive CAD, PVD with right vertebral artery occlusion and left subclavian artery stenosis status post stenting in 2018 followed by vascular surgery, CVA, HLD, hypothyroidism, and GERD who presents for evaluation of Zio and echo.   Remote nuclear stress test in 2012 with preserved LV systolic function and no evidence of perfusion defect.  Echo in 12/2014 showed an EF of 60 to 65%, no regional wall motion abnormalities, grade 1 diastolic dysfunction, trivial mitral regurgitation, normal RV systolic function, and normal RVSP.  Treadmill MPI in 11/2016 was overall low risk with an EF of 76%.  Cardiac monitoring in 2018 showed a predominant rhythm of sinus with no significant arrhythmia or prolonged pauses.  Patient triggered events corresponded to sinus rhythm.  LHC in 04/2017 showed mild to moderate, nonobstructive CAD predominantly affecting the LAD.  Sequential 50% proximal and 20 to 30% mid LAD stenoses that were not hemodynamically significant with an FFR of 0.92.  Coronary CTA in 12/2019 showed a calcium  score of 302 which was the 79th percentile with 25 to 49% of proximal and mid LAD stenosis and 0 to 24% stenosis in the ramus and LCx.   She was seen in 04/2022 at which time she reported lightheadedness and feeling off balance over the preceding month without focal neurological deficits.  Orthostatics were negative.  Labs were unrevealing.  She was encouraged to follow-up with vascular surgery for ongoing management of cerebrovascular disease.  She was seen in the office in 12/2022 and reported having lost her husband as well as a severe reaction to a cortisone injection for bursitis in the left  hip that led to a significant immune response, weight loss, and prolonged recovery period of approximately 4 months.  She also reported changes to her vision that she attributed to known glaucoma and macular degeneration.  She was without symptoms of angina or cardiac decompensation.  She was last seen in the office in 09/2023 reporting an episode of fatigue while driving several weeks prior without associated chest pain, dizziness, presyncope, palpitations.  Upon arriving home she checked her blood pressure which was in the 90s over 50s.  She reported slow improvement of fatigue with a slow gradual uptrend in blood pressure.  At the time of her office visit, blood pressure was 120/60 and she was back to baseline with no residual symptoms.  Echo in 09/2023 showed an EF of 55 to 60%, no regional wall motion abnormalities, normal RV systolic function and ventricular cavity size, mild mitral regurgitation, mild aortic insufficiency, and an estimated right atrial pressure of 3 mmHg.  Zio in 09/2023 showed a predominant rhythm of sinus with an average rate of 70 bpm (range 50 to 105 bpm in sinus), 34 episodes of SVT lasting up to 18 seconds, and rare atrial and ventricular ectopy.  Patient triggered events corresponded to sinus rhythm, PSVT, and PVCs.  She comes in doing well from a cardiac perspective and is without symptoms of angina or cardiac decompensation.  She reports 1 further episode of fatigue since she was last seen that was not as severe as initial episode.  No frank syncope.  No palpitations noted while wearing  cardiac monitor.  Remains active.  No significant lower extremity swelling or progressive orthopnea.  No falls or symptoms concerning for bleeding.  Symptoms do not feel similar to what she was experiencing leading up to her subclavian artery stenting.   Labs independently reviewed: 09/2023 - potassium 4.9, BUN 12, serum creatinine 0.72, albumin 4.5, AST/ALT normal, Hgb 14.9, PLT 217 06/2023 - TC  159, TG 67, HDL 75, LDL 71, A1c 6.0, TSH normal   Past Medical History:  Diagnosis Date   Anxiety    Barrett esophagus 2003   Cerebral infarction (HCC) 12/26/2014   Cerebral infarction due to embolism of right vertebral artery (HCC)    Cerebrovascular accident (CVA) (HCC) 12/26/2014   Diverticulitis    Diverticulosis    GERD (gastroesophageal reflux disease)    Glaucoma    Grave's disease    H. pylori infection    Hepatitis A    in her 19 from food   Hyperlipidemia    Hypothyroidism    Increased pressure in the eye    Insomnia    Morton's neuroma    Murmur    Scoliosis    Stroke (HCC) 12/2014   no deficits    Past Surgical History:  Procedure Laterality Date   AORTIC ARCH ANGIOGRAPHY N/A 01/01/2017   Procedure: AORTIC ARCH ANGIOGRAPHY;  Surgeon: Serene Gaile ORN, MD;  Location: [facility];  Service: Cardiovascular;  Laterality: N/A;   APPENDECTOMY  1958   CATARACT EXTRACTION Left    CATARACT EXTRACTION  02/12/2018   CATARACT EXTRACTION W/PHACO Left 07/02/2016   Procedure: CATARACT EXTRACTION PHACO AND INTRAOCULAR LENS PLACEMENT (IOC)  Left;  Surgeon: Donzell Arlyce Budd, MD;  Location: [facility];  Service: Ophthalmology;  Laterality: Left;   CATARACT EXTRACTION W/PHACO Right 02/12/2018   Procedure: CATARACT EXTRACTION PHACO AND INTRAOCULAR LENS PLACEMENT (IOC)  RIGHT;  Surgeon: Mittie Gaskin, MD;  Location: [facility];  Service: Ophthalmology;  Laterality: Right;   CERVICAL LAMINECTOMY  1995   COLON SURGERY     CORONARY PRESSURE/FFR STUDY N/A 04/19/2017   Procedure: INTRAVASCULAR PRESSURE WIRE/FFR STUDY;  Surgeon: Mady Bruckner, MD;  Location: [facility];  Service: Cardiovascular;  Laterality: N/A;   EYE SURGERY     due to graves disease   EYE SURGERY Left    06/2023   FEMUR SURGERY Right    benign bone growth   FOOT NEUROMA SURGERY Right    LAPAROSCOPIC ASSISTED VAGINAL HYSTERECTOMY     LAPAROSCOPIC SIGMOID COLECTOMY   05/13/2006   Dr Lily   LEFT HEART CATH AND CORONARY ANGIOGRAPHY N/A 04/19/2017   Procedure: LEFT HEART CATH AND CORONARY ANGIOGRAPHY;  Surgeon: Mady Bruckner, MD;  Location: [facility];  Service: Cardiovascular;  Laterality: N/A;   LUMBAR DISC SURGERY  2009   PERIPHERAL VASCULAR INTERVENTION Left 01/01/2017   Procedure: PERIPHERAL VASCULAR INTERVENTION;  Surgeon: Serene Gaile ORN, MD;  Location: [facility];  Service: Cardiovascular;  Laterality: Left;   SUBCLAVIAN STENT PLACEMENT     TONSILLECTOMY  1992   TRIGGER FINGER RELEASE Right 05/26/2018   Procedure: RIGHT INDEX RELEASE TRIGGER FINGER/A-1 PULLEY;  Surgeon: Murrell Drivers, MD;  Location: [facility];  Service: Orthopedics;  Laterality: Right;   TUBAL LIGATION     UPPER EXTREMITY ANGIOGRAPHY Left 01/01/2017   Procedure: Upper Extremity Angiography;  Surgeon: Serene Gaile ORN, MD;  [facility];  Service: Cardiovascular;  Laterality: Left;    Current Medications: Current Meds  Medication Sig   acetaminophen  (TYLENOL ) 500 MG tablet Take 500 mg by mouth daily as needed for moderate pain or headache.    Calcium  Citrate-Vitamin D  (CALCIUM  CITRATE + PO) Take by mouth daily at 12 noon.   clopidogrel  (PLAVIX ) 75 MG tablet TAKE 1 TABLET BY MOUTH EVERY DAY   diazepam  (VALIUM ) 5 MG tablet Take 1 tablet (5 mg total) by mouth at bedtime as needed for anxiety (sleep).   dorzolamide -timolol  (COSOPT) 22.3-6.8 MG/ML ophthalmic solution Place into both eyes daily.   isosorbide  mononitrate (IMDUR ) 30 MG 24 hr tablet TAKE 1/2 OF A TABLET (15 MG TOTAL) BY MOUTH DAILY   latanoprost  (XALATAN ) 0.005 % ophthalmic solution 1 drop at bedtime.   levothyroxine (SYNTHROID) 75 MCG tablet Take 75 mcg by mouth every morning.   liothyronine (CYTOMEL) 5 MCG tablet Take 5 mcg by mouth. Takes 4 times weekly.   Lutein 6 MG TABS Take 6 mg by mouth daily. Takes four times a week   Multiple Vitamin (MULTIVITAMIN) tablet  Take 1 tablet by mouth daily.   rosuvastatin  (CRESTOR ) 40 MG tablet Take 1 tablet (40 mg total) by mouth daily.    Allergies:   Atorvastatin , Neuromuscular blocking agents, and Morphine  and codeine   Social History   Socioeconomic History   Marital status: Married    Spouse name: Not on file   Number of children: 1   Years of education: Not on file   Highest education level: Not on file  Occupational History   Occupation: Pensions consultant    Comment: @ [employer] on [employer]  Tobacco Use   Smoking status: Former    Current packs/day: 0.00    Types: Cigarettes    Quit date: 03/12/1978    Years since quitting: 45.7    Passive exposure: Never   Smokeless tobacco: Never   Tobacco comments:    quit in 1978  Vaping Use   Vaping status: Never Used  Substance and Sexual Activity   Alcohol use: Yes    Alcohol/week: 7.0 standard drinks of alcohol    Types: 7 Glasses of wine per week    Comment: 1 glass of wine every evening   Drug use: No   Sexual activity: Not Currently    Birth control/protection: None  Other Topics Concern   Not on file  Social History Narrative   Not on file   Social Drivers of Health   Financial Resource Strain: Low Risk  (09/12/2023)   Overall Financial Resource Strain (CARDIA)    Difficulty of Paying Living Expenses: Not hard at all  Food Insecurity: No Food Insecurity (09/12/2023)   Hunger Vital Sign    Worried About Running Out of Food in the Last Year: Never true    Ran Out of Food in the Last Year: Never true  Transportation Needs: No Transportation Needs (09/12/2023)   PRAPARE - Administrator, Civil Service (Medical): No    Lack of Transportation (Non-Medical): No  Physical Activity: Insufficiently Active (09/12/2023)   Exercise Vital Sign    Days of Exercise per Week: 3 days    Minutes of Exercise per Session: 30 min  Stress: No Stress Concern Present (09/12/2023)   [employer] of Occupational Health - Occupational Stress  Questionnaire    Feeling of Stress: Not at all  Social Connections: Moderately Isolated (09/12/2023)   Social Connection and Isolation Panel    Frequency of Communication with Friends and Family: More than three times a week  Frequency of Social Gatherings with Friends and Family: Three times a week    Attends Religious Services: More than 4 times per year    Active Member of Clubs or Organizations: No    Attends Banker Meetings: Never    Marital Status: Widowed     Family History:  The patient's family history includes Breast cancer in her cousin, cousin, cousin, cousin, maternal aunt, paternal aunt, and sister; Cirrhosis in her brother; Diabetes in her brother and sister; Heart attack (age of onset: 66) in her sister; Heart disease in her brother; Heart failure in her sister; Heart failure (age of onset: 42) in her mother; Heart murmur in her daughter and mother; Hypertension in her brother, father, and sister; Other (age of onset: 5) in her father; Stroke in her sister; Stroke (age of onset: 49) in her father. There is no history of Stomach cancer, Colon cancer, Esophageal cancer, or Pancreatic cancer.  ROS:   12-point review of systems is negative unless otherwise noted in the HPI.   EKGs/Labs/Other Studies Reviewed:    Studies reviewed were summarized above. The additional studies were reviewed today:  Zio patch 09/2023:   The patient was monitored for 13 days, 18 hours.  12 days, 13 hours were suitable for analysis after removal of artifact.   The predominant rhythm was sinus with an average rate of 70 bpm (range 50-105 bpm in sinus).   Rare PACs and PVCs were observed.   There were 34 supraventricular runs, lasting up to 18.0 seconds with a maximum rate of 188 bpm.   No sustained arrhythmia or prolonged pause occurred.   Patient triggered events correspond to sinus rhythm, PSVT, and PVCs.   Predominantly sinus rhythm with rare PACs and PVCs as well as multiple  episodes of PSVT, as detailed above. __________  2D echo 09/30/2023: 1. Left ventricular ejection fraction, by estimation, is 55 to 60%. The  left ventricle has normal function. The left ventricle has no regional  wall motion abnormalities. Left ventricular diastolic parameters are  indeterminate.   2. Right ventricular systolic function is normal. The right ventricular  size is normal. Tricuspid regurgitation signal is inadequate for assessing  PA pressure.   3. The mitral valve is normal in structure. Mild mitral valve  regurgitation. No evidence of mitral stenosis.   4. The aortic valve has an indeterminant number of cusps. Aortic valve  regurgitation is mild. No aortic stenosis is present.   5. The inferior vena cava is normal in size with greater than 50%  respiratory variability, suggesting right atrial pressure of 3 mmHg.  __________  Coronary CTA 12/17/2019: Aorta: Normal size. Minimal descending aortic wall calcifications. No dissection.   Aortic Valve:  Trileaflet.  No calcifications.   Coronary Arteries:  Normal coronary origin.  Right dominance.   RCA is a large dominant artery that gives rise to a PDA and PLA. There is no plaque.   Left main is a large artery that gives rise to a Ramus, LAD and LCX arteries. There is mild calcifications in the mid Ramus causing minimal stenosis (0-24%)   LAD is a large vessel that has calcified plaque in the proximal and mid segments causing mild stenosis (25-49%).   LCX is a non-dominant artery that gives rise to a small OM1 branch and a larger OM2 branch. There is mild calcified plaque in the mid LCx causing minimal stenosis (0-24%)   Other findings:   Normal pulmonary vein drainage into the left atrium.  Normal left atrial appendage without a thrombus.   Normal size of the pulmonary artery.   IMPRESSION: 1. High coronary calcium  score of 302. This was 79th percentile for age and sex matched control. 2. Normal  coronary origin with right dominance. 3. Calcified plaque in the proximal and mid LAD causing mild stenosis (25-49%). 4. Calcified plaque causing minimal stenosis (0-24%) in the Ramus and LCx 5. CAD-RADS 2. Mild non-obstructive CAD (25-49%). Consider non-atherosclerotic causes of chest pain. Consider preventive therapy and risk factor modification. __________   LHC 04/19/2017: Conclusions: Mild to moderate, nonobstructive coronary artery disease predominantly affecting the LAD.  Sequential 50% proximal and 20-30% mid LAD stenoses are not hemodynamically significant (FFR 0.92). Normal left ventricular contraction with mildly elevated filling pressure.   Recommendations: Medical therapy including continuation of long-acting nitrate for possible component of microvascular disc function. Risk factor modification and medical therapy to prevent progression of coronary and peripheral vascular disease. __________   Zio patch 11/2016: The patient was monitored for 30 days. Predominant rhythm was sinus with an average rate of 90 bpm. No significant arrhythmia or prolonged pause was identified. Patient triggered event corresponds to sinus rhythm.   Sinus rhythm. No significant arrhythmia. __________   Treadmill MPI 11/22/2016: Nuclear stress EF: 76%. The left ventricular ejection fraction is hyperdynamic (>65%). Normal wall motion . Defect 1: There is a small defect of mild severity present in the apical anterior and apex location. The defect is not reversible and is most likely due to apical thinning. The study is normal. This is a low risk study. __________   2D echo 12/28/2014: - Left ventricle: The cavity size was normal. Systolic function was    normal. The estimated ejection fraction was in the range of 60%    to 65%. Wall motion was normal; there were no regional wall    motion abnormalities. Doppler parameters are consistent with    abnormal left ventricular relaxation (grade 1  diastolic    dysfunction). There was no evidence of elevated ventricular    filling pressure by Doppler parameters.  - Aortic valve: Trileaflet; normal thickness leaflets. There was no    regurgitation.  - Aortic root: The aortic root was normal in size.  - Mitral valve: Mildly thickened leaflets . There was trivial    regurgitation.  - Left atrium: The atrium was normal in size.  - Right ventricle: Systolic function was normal.  - Right atrium: The atrium was normal in size.  - Tricuspid valve: There was trivial regurgitation.  - Pulmonic valve: There was no regurgitation.  - Pulmonary arteries: Systolic pressure was within the normal    range.  - Inferior vena cava: The vessel was normal in size.  - Pericardium, extracardiac: There was no pericardial effusion.  __________   See CV Studies in Epic for more remote imaging   EKG:  EKG is ordered today.  The EKG ordered today demonstrates sinus bradycardia with sinus arrhythmia, 59 bpm, no acute ST-T changes  Recent Labs: 06/25/2023: TSH 0.557 09/23/2023: ALT 25; BUN 12; Creatinine, Ser 0.72; Hemoglobin 14.9; Platelets 217; Sodium 140 09/24/2023: Potassium 4.9  Recent Lipid Panel    Component Value Date/Time   CHOL 159 06/25/2023 0923   TRIG 67 06/25/2023 0923   HDL 75 06/25/2023 0923   CHOLHDL 2.1 06/25/2023 0923   CHOLHDL 2.5 04/12/2022 1429   VLDL 21 04/12/2022 1429   LDLCALC 71 06/25/2023 0923    PHYSICAL EXAM:    VS:  BP (!) 144/81 (  BP Location: Left Arm, Patient Position: Sitting, Cuff Size: Normal)   Pulse 60   Resp 19   Ht 5' 1 (1.549 m)   Wt 112 lb 8 oz (51 kg)   SpO2 96%   BMI 21.26 kg/m   BMI: Body mass index is 21.26 kg/m.  Physical Exam Vitals reviewed.  Constitutional:      Appearance: She is well-developed.  HENT:     Head: Normocephalic and atraumatic.  Eyes:     General:        Right eye: No discharge.        Left eye: No discharge.  Cardiovascular:     Rate and Rhythm: Normal rate and  regular rhythm.     Pulses:          Dorsalis pedis pulses are 2+ on the right side and 2+ on the left side.       Posterior tibial pulses are 2+ on the right side and 2+ on the left side.     Heart sounds: Normal heart sounds, S1 normal and S2 normal. Heart sounds not distant. No midsystolic click and no opening snap. No murmur heard.    No friction rub.  Pulmonary:     Effort: Pulmonary effort is normal. No respiratory distress.     Breath sounds: Normal breath sounds. No decreased breath sounds, wheezing, rhonchi or rales.  Musculoskeletal:     Cervical back: Normal range of motion.     Right lower leg: No edema.     Left lower leg: No edema.  Skin:    General: Skin is warm and dry.     Nails: There is no clubbing.  Neurological:     Mental Status: She is alert and oriented to person, place, and time.  Psychiatric:        Speech: Speech normal.        Behavior: Behavior normal.        Thought Content: Thought content normal.        Judgment: Judgment normal.     Wt Readings from Last 3 Encounters:  11/13/23 112 lb 8 oz (51 kg)  09/23/23 115 lb 12.8 oz (52.5 kg)  07/03/23 115 lb 1.9 oz (52.2 kg)     ASSESSMENT & PLAN:   Fatigue: No evidence of significant structural abnormality by echo, or evidence of sustained arrhythmia, post termination/prolonged pauses, or high-grade AV block on Zio patch.  Labs stable.  She has had 1 further episode since she was last seen, though not as severe as initial episode.  Follow-up with PCP if symptoms persist.  Nonobstructive CAD: No symptoms suggestive of angina or cardiac decompensation.  Continue aggressive risk factor modification and primary prevention including clopidogrel  (under the direction of vascular surgery given underlying cerebrovascular disease), Imdur  15 mg, and rosuvastatin  40 mg.  PSVT: Recent Zio patch showed 34 episodes of SVT lasting up to 18 seconds with a maximum rate of 188 bpm.  Defer addition of standing AV nodal  blocking medication  Mitral regurg/aortic insufficiency: Mild by echo in 09/2023.  Monitor periodically.  PAD: Recent imaging in 04/2023 showed stable anatomy.  She remains on clopidogrel  and rosuvastatin .  Follow-up with vascular surgery as directed.  HLD: LDL 71 in 06/2023.  She remains on rosuvastatin  40 mg.     Disposition: F/u with Dr. Mady or an APP in 6 months.   Medication Adjustments/Labs and Tests Ordered: Current medicines are reviewed at length with the patient today.  Concerns regarding medicines  are outlined above. Medication changes, Labs and Tests ordered today are summarized above and listed in the Patient Instructions accessible in Encounters.   Signed, Bernardino Bring, PA-C 11/13/2023 12:24 PM     [facility] [place] [address] [place], [place] [address] [phone]

## 2023-11-14 DIAGNOSIS — H353132 Nonexudative age-related macular degeneration, bilateral, intermediate dry stage: Secondary | ICD-10-CM | POA: Diagnosis not present

## 2023-11-14 DIAGNOSIS — Z961 Presence of intraocular lens: Secondary | ICD-10-CM | POA: Diagnosis not present

## 2023-11-14 DIAGNOSIS — H43393 Other vitreous opacities, bilateral: Secondary | ICD-10-CM | POA: Diagnosis not present

## 2023-11-14 DIAGNOSIS — H401114 Primary open-angle glaucoma, right eye, indeterminate stage: Secondary | ICD-10-CM | POA: Diagnosis not present

## 2023-11-14 DIAGNOSIS — H35033 Hypertensive retinopathy, bilateral: Secondary | ICD-10-CM | POA: Diagnosis not present

## 2023-11-14 DIAGNOSIS — H401123 Primary open-angle glaucoma, left eye, severe stage: Secondary | ICD-10-CM | POA: Diagnosis not present

## 2023-11-14 DIAGNOSIS — H35371 Puckering of macula, right eye: Secondary | ICD-10-CM | POA: Diagnosis not present

## 2023-11-14 DIAGNOSIS — H43813 Vitreous degeneration, bilateral: Secondary | ICD-10-CM | POA: Diagnosis not present

## 2023-11-22 ENCOUNTER — Ambulatory Visit: Payer: Self-pay

## 2023-11-22 NOTE — Telephone Encounter (Signed)
 FYI Only or Action Required?: FYI only for provider.  Patient was last seen in primary care on 07/03/2023 by Chandra Toribio POUR, MD.  Called Nurse Triage reporting Cough.  Symptoms began about a month ago.  Interventions attempted: OTC medications: mucinex.  Symptoms are: unchanged.  Triage Disposition: See PCP Within 2 Weeks  Patient/caregiver understands and will follow disposition?: Yes Reason for Disposition  Cough lasts > 3 weeks  Answer Assessment - Initial Assessment Questions Saw cardiologist last week and the PA said her lung sounds were clear and they did not suspect pneumonia. Denies lower leg edema. Not worsening, just not improving. Negative covid test. Has been taking mucinex.  1. ONSET: When did the cough begin?      One month  2. SEVERITY: How bad is the cough today?      Mild  3. SPUTUM: Describe the color of your sputum (e.g., none, dry cough; clear, white, yellow, green)     Clear, thick  4. HEMOPTYSIS: Are you coughing up any blood? If so ask: How much? (e.g., flecks, streaks, tablespoons, etc.)     Denies  5. DIFFICULTY BREATHING: Are you having difficulty breathing? If Yes, ask: How bad is it? (e.g., mild, moderate, severe)      Denies  6. FEVER: Do you have a fever? If Yes, ask: What is your temperature, how was it measured, and when did it start?     Denies  7. CARDIAC/OTHER HISTORY: Do you have any history of heart disease? (e.g., heart attack, congestive heart failure)      CVA in 2016 and left subclavian stent  8. LUNG HISTORY: Do you have any history of lung disease?  (e.g., pulmonary embolus, asthma, emphysema)     Denies  Protocols used: Cough - Chronic-A-AH Copied from CRM #[id number]. Topic: Appointments - Appointment Scheduling >> Nov 22, 2023  1:59 PM Debby BROCKS wrote: Patient has had a cough for roughly 1 month now which seems to be getting worse and not going away and would like to get it checked out. She wants to stay  in [facility] and not other PCs that have availability sooner. Sept 30th with Dr. Flynn is too far out for her

## 2023-11-25 ENCOUNTER — Ambulatory Visit: Admitting: Family Medicine

## 2023-11-25 ENCOUNTER — Ambulatory Visit: Admitting: Physician Assistant

## 2023-11-28 NOTE — Telephone Encounter (Signed)
 Please call the patient and let her know I can order a ct scan of her chest for her chronic cough but if our appointments are booked up there's nothing I can do to get her in sooner.

## 2023-11-29 NOTE — Telephone Encounter (Signed)
 Called pt she stated that she is feeling much better she said no need for the CT scan

## 2024-01-08 ENCOUNTER — Other Ambulatory Visit: Payer: Self-pay | Admitting: Internal Medicine

## 2024-01-10 ENCOUNTER — Other Ambulatory Visit: Payer: Self-pay | Admitting: Family Medicine

## 2024-01-10 DIAGNOSIS — Z1231 Encounter for screening mammogram for malignant neoplasm of breast: Secondary | ICD-10-CM

## 2024-01-13 DIAGNOSIS — H401133 Primary open-angle glaucoma, bilateral, severe stage: Secondary | ICD-10-CM | POA: Diagnosis not present

## 2024-01-17 DIAGNOSIS — H401133 Primary open-angle glaucoma, bilateral, severe stage: Secondary | ICD-10-CM | POA: Diagnosis not present

## 2024-01-25 ENCOUNTER — Other Ambulatory Visit: Payer: Self-pay | Admitting: Family Medicine

## 2024-01-25 DIAGNOSIS — I25118 Atherosclerotic heart disease of native coronary artery with other forms of angina pectoris: Secondary | ICD-10-CM

## 2024-01-27 ENCOUNTER — Other Ambulatory Visit: Payer: Self-pay | Admitting: Internal Medicine

## 2024-02-04 ENCOUNTER — Ambulatory Visit
Admission: RE | Admit: 2024-02-04 | Discharge: 2024-02-04 | Disposition: A | Source: Ambulatory Visit | Attending: Family Medicine

## 2024-02-04 DIAGNOSIS — Z1231 Encounter for screening mammogram for malignant neoplasm of breast: Secondary | ICD-10-CM

## 2024-03-23 ENCOUNTER — Encounter: Payer: Self-pay | Admitting: *Deleted

## 2024-06-29 ENCOUNTER — Other Ambulatory Visit

## 2024-07-06 ENCOUNTER — Encounter: Admitting: Family Medicine

## 2024-10-29 ENCOUNTER — Ambulatory Visit
